# Patient Record
Sex: Female | Born: 1955 | Race: Black or African American | Hispanic: No | Marital: Single | Smoking: Current every day smoker
Health system: Southern US, Community
[De-identification: ages and names within clinical notes are randomized; demographics above are authoritative.]

## PROBLEM LIST (undated history)

## (undated) DIAGNOSIS — J40 Bronchitis, not specified as acute or chronic: Secondary | ICD-10-CM

## (undated) DIAGNOSIS — F1010 Alcohol abuse, uncomplicated: Secondary | ICD-10-CM

## (undated) DIAGNOSIS — J9610 Chronic respiratory failure, unspecified whether with hypoxia or hypercapnia: Secondary | ICD-10-CM

## (undated) DIAGNOSIS — Z720 Tobacco use: Secondary | ICD-10-CM

## (undated) DIAGNOSIS — M069 Rheumatoid arthritis, unspecified: Secondary | ICD-10-CM

## (undated) DIAGNOSIS — J189 Pneumonia, unspecified organism: Secondary | ICD-10-CM

## (undated) DIAGNOSIS — J449 Chronic obstructive pulmonary disease, unspecified: Secondary | ICD-10-CM

## (undated) DIAGNOSIS — E871 Hypo-osmolality and hyponatremia: Secondary | ICD-10-CM

## (undated) DIAGNOSIS — F1410 Cocaine abuse, uncomplicated: Secondary | ICD-10-CM

## (undated) DIAGNOSIS — I219 Acute myocardial infarction, unspecified: Secondary | ICD-10-CM

## (undated) HISTORY — PX: TUBAL LIGATION: SHX77

## (undated) HISTORY — PX: TONSILLECTOMY: SUR1361

---

## 1997-05-25 ENCOUNTER — Emergency Department (HOSPITAL_COMMUNITY): Admission: EM | Admit: 1997-05-25 | Discharge: 1997-05-25 | Payer: Self-pay | Admitting: Emergency Medicine

## 1997-11-01 ENCOUNTER — Emergency Department (HOSPITAL_COMMUNITY): Admission: EM | Admit: 1997-11-01 | Discharge: 1997-11-01 | Payer: Self-pay | Admitting: Emergency Medicine

## 1997-11-01 ENCOUNTER — Encounter: Payer: Self-pay | Admitting: Emergency Medicine

## 1998-08-27 ENCOUNTER — Encounter: Payer: Self-pay | Admitting: Emergency Medicine

## 1998-08-27 ENCOUNTER — Emergency Department (HOSPITAL_COMMUNITY): Admission: EM | Admit: 1998-08-27 | Discharge: 1998-08-27 | Payer: Self-pay | Admitting: Emergency Medicine

## 2000-08-27 ENCOUNTER — Encounter: Payer: Self-pay | Admitting: Family Medicine

## 2000-08-27 ENCOUNTER — Ambulatory Visit (HOSPITAL_COMMUNITY): Admission: RE | Admit: 2000-08-27 | Discharge: 2000-08-27 | Payer: Self-pay | Admitting: Family Medicine

## 2001-07-10 ENCOUNTER — Encounter: Payer: Self-pay | Admitting: Emergency Medicine

## 2001-07-10 ENCOUNTER — Emergency Department (HOSPITAL_COMMUNITY): Admission: EM | Admit: 2001-07-10 | Discharge: 2001-07-10 | Payer: Self-pay | Admitting: Emergency Medicine

## 2001-12-22 ENCOUNTER — Emergency Department (HOSPITAL_COMMUNITY): Admission: EM | Admit: 2001-12-22 | Discharge: 2001-12-22 | Payer: Self-pay | Admitting: *Deleted

## 2002-03-06 ENCOUNTER — Emergency Department (HOSPITAL_COMMUNITY): Admission: EM | Admit: 2002-03-06 | Discharge: 2002-03-06 | Payer: Self-pay | Admitting: Emergency Medicine

## 2002-07-16 ENCOUNTER — Emergency Department (HOSPITAL_COMMUNITY): Admission: EM | Admit: 2002-07-16 | Discharge: 2002-07-16 | Payer: Self-pay | Admitting: Emergency Medicine

## 2002-07-23 ENCOUNTER — Emergency Department (HOSPITAL_COMMUNITY): Admission: AD | Admit: 2002-07-23 | Discharge: 2002-07-23 | Payer: Self-pay

## 2002-11-19 ENCOUNTER — Emergency Department (HOSPITAL_COMMUNITY): Admission: EM | Admit: 2002-11-19 | Discharge: 2002-11-19 | Payer: Self-pay | Admitting: Emergency Medicine

## 2003-05-20 ENCOUNTER — Emergency Department (HOSPITAL_COMMUNITY): Admission: EM | Admit: 2003-05-20 | Discharge: 2003-05-20 | Payer: Self-pay | Admitting: Emergency Medicine

## 2003-06-11 ENCOUNTER — Emergency Department (HOSPITAL_COMMUNITY): Admission: EM | Admit: 2003-06-11 | Discharge: 2003-06-11 | Payer: Self-pay | Admitting: Emergency Medicine

## 2003-07-15 ENCOUNTER — Emergency Department (HOSPITAL_COMMUNITY): Admission: EM | Admit: 2003-07-15 | Discharge: 2003-07-15 | Payer: Self-pay | Admitting: Emergency Medicine

## 2004-03-11 ENCOUNTER — Emergency Department (HOSPITAL_COMMUNITY): Admission: EM | Admit: 2004-03-11 | Discharge: 2004-03-11 | Payer: Self-pay | Admitting: Emergency Medicine

## 2004-03-29 ENCOUNTER — Emergency Department (HOSPITAL_COMMUNITY): Admission: EM | Admit: 2004-03-29 | Discharge: 2004-03-29 | Payer: Self-pay | Admitting: Emergency Medicine

## 2004-03-31 ENCOUNTER — Ambulatory Visit: Payer: Self-pay | Admitting: *Deleted

## 2004-03-31 ENCOUNTER — Ambulatory Visit: Payer: Self-pay | Admitting: Internal Medicine

## 2004-04-07 ENCOUNTER — Ambulatory Visit: Payer: Self-pay | Admitting: Internal Medicine

## 2004-05-27 ENCOUNTER — Ambulatory Visit: Payer: Self-pay | Admitting: Internal Medicine

## 2004-06-03 ENCOUNTER — Ambulatory Visit: Payer: Self-pay | Admitting: Internal Medicine

## 2004-07-26 ENCOUNTER — Emergency Department (HOSPITAL_COMMUNITY): Admission: EM | Admit: 2004-07-26 | Discharge: 2004-07-26 | Payer: Self-pay | Admitting: Emergency Medicine

## 2004-07-31 ENCOUNTER — Ambulatory Visit: Payer: Self-pay | Admitting: Internal Medicine

## 2004-12-03 ENCOUNTER — Ambulatory Visit: Payer: Self-pay | Admitting: Internal Medicine

## 2004-12-28 ENCOUNTER — Emergency Department (HOSPITAL_COMMUNITY): Admission: EM | Admit: 2004-12-28 | Discharge: 2004-12-28 | Payer: Self-pay | Admitting: Emergency Medicine

## 2005-01-30 ENCOUNTER — Emergency Department (HOSPITAL_COMMUNITY): Admission: EM | Admit: 2005-01-30 | Discharge: 2005-01-30 | Payer: Self-pay | Admitting: Emergency Medicine

## 2005-03-08 ENCOUNTER — Emergency Department (HOSPITAL_COMMUNITY): Admission: EM | Admit: 2005-03-08 | Discharge: 2005-03-08 | Payer: Self-pay | Admitting: *Deleted

## 2005-04-11 ENCOUNTER — Emergency Department (HOSPITAL_COMMUNITY): Admission: EM | Admit: 2005-04-11 | Discharge: 2005-04-11 | Payer: Self-pay | Admitting: Emergency Medicine

## 2005-05-04 ENCOUNTER — Ambulatory Visit: Payer: Self-pay | Admitting: Internal Medicine

## 2005-05-07 ENCOUNTER — Ambulatory Visit: Payer: Self-pay | Admitting: Internal Medicine

## 2005-05-10 ENCOUNTER — Emergency Department (HOSPITAL_COMMUNITY): Admission: EM | Admit: 2005-05-10 | Discharge: 2005-05-10 | Payer: Self-pay | Admitting: *Deleted

## 2005-05-22 ENCOUNTER — Ambulatory Visit: Payer: Self-pay | Admitting: Internal Medicine

## 2005-06-12 ENCOUNTER — Emergency Department (HOSPITAL_COMMUNITY): Admission: EM | Admit: 2005-06-12 | Discharge: 2005-06-12 | Payer: Self-pay | Admitting: Emergency Medicine

## 2005-08-12 ENCOUNTER — Emergency Department (HOSPITAL_COMMUNITY): Admission: EM | Admit: 2005-08-12 | Discharge: 2005-08-13 | Payer: Self-pay | Admitting: Emergency Medicine

## 2005-10-14 ENCOUNTER — Emergency Department (HOSPITAL_COMMUNITY): Admission: EM | Admit: 2005-10-14 | Discharge: 2005-10-15 | Payer: Self-pay | Admitting: Emergency Medicine

## 2006-02-10 ENCOUNTER — Ambulatory Visit: Payer: Self-pay | Admitting: Family Medicine

## 2006-06-07 ENCOUNTER — Ambulatory Visit: Payer: Self-pay | Admitting: Internal Medicine

## 2006-06-08 ENCOUNTER — Emergency Department (HOSPITAL_COMMUNITY): Admission: EM | Admit: 2006-06-08 | Discharge: 2006-06-08 | Payer: Self-pay | Admitting: Emergency Medicine

## 2006-06-12 ENCOUNTER — Emergency Department (HOSPITAL_COMMUNITY): Admission: EM | Admit: 2006-06-12 | Discharge: 2006-06-13 | Payer: Self-pay | Admitting: *Deleted

## 2006-07-06 ENCOUNTER — Emergency Department (HOSPITAL_COMMUNITY): Admission: EM | Admit: 2006-07-06 | Discharge: 2006-07-06 | Payer: Self-pay | Admitting: Emergency Medicine

## 2006-07-26 DIAGNOSIS — J439 Emphysema, unspecified: Secondary | ICD-10-CM

## 2006-07-26 DIAGNOSIS — B370 Candidal stomatitis: Secondary | ICD-10-CM

## 2006-09-15 ENCOUNTER — Ambulatory Visit: Payer: Self-pay | Admitting: Family Medicine

## 2006-10-27 ENCOUNTER — Encounter (INDEPENDENT_AMBULATORY_CARE_PROVIDER_SITE_OTHER): Payer: Self-pay | Admitting: *Deleted

## 2006-11-25 ENCOUNTER — Ambulatory Visit: Payer: Self-pay | Admitting: Internal Medicine

## 2006-12-13 ENCOUNTER — Ambulatory Visit: Payer: Self-pay | Admitting: Internal Medicine

## 2006-12-20 ENCOUNTER — Ambulatory Visit: Payer: Self-pay | Admitting: Internal Medicine

## 2007-01-14 ENCOUNTER — Emergency Department (HOSPITAL_COMMUNITY): Admission: EM | Admit: 2007-01-14 | Discharge: 2007-01-14 | Payer: Self-pay | Admitting: Emergency Medicine

## 2007-05-02 ENCOUNTER — Ambulatory Visit: Payer: Self-pay | Admitting: Internal Medicine

## 2007-05-17 ENCOUNTER — Emergency Department (HOSPITAL_COMMUNITY): Admission: EM | Admit: 2007-05-17 | Discharge: 2007-05-17 | Payer: Self-pay | Admitting: Emergency Medicine

## 2007-05-25 ENCOUNTER — Inpatient Hospital Stay (HOSPITAL_COMMUNITY): Admission: EM | Admit: 2007-05-25 | Discharge: 2007-06-09 | Payer: Self-pay | Admitting: Emergency Medicine

## 2007-05-25 ENCOUNTER — Ambulatory Visit: Payer: Self-pay | Admitting: Internal Medicine

## 2007-05-27 ENCOUNTER — Encounter: Payer: Self-pay | Admitting: Internal Medicine

## 2007-06-08 ENCOUNTER — Encounter: Payer: Self-pay | Admitting: Cardiology

## 2007-06-11 ENCOUNTER — Emergency Department (HOSPITAL_COMMUNITY): Admission: EM | Admit: 2007-06-11 | Discharge: 2007-06-12 | Payer: Self-pay | Admitting: Emergency Medicine

## 2007-06-29 DIAGNOSIS — I509 Heart failure, unspecified: Secondary | ICD-10-CM | POA: Insufficient documentation

## 2007-06-29 DIAGNOSIS — I10 Essential (primary) hypertension: Secondary | ICD-10-CM

## 2007-06-29 DIAGNOSIS — J984 Other disorders of lung: Secondary | ICD-10-CM

## 2007-07-18 ENCOUNTER — Ambulatory Visit: Payer: Self-pay | Admitting: Internal Medicine

## 2007-08-07 ENCOUNTER — Emergency Department (HOSPITAL_COMMUNITY): Admission: EM | Admit: 2007-08-07 | Discharge: 2007-08-07 | Payer: Self-pay | Admitting: Emergency Medicine

## 2007-08-08 ENCOUNTER — Ambulatory Visit: Payer: Self-pay | Admitting: Internal Medicine

## 2007-08-09 ENCOUNTER — Emergency Department (HOSPITAL_COMMUNITY): Admission: EM | Admit: 2007-08-09 | Discharge: 2007-08-09 | Payer: Self-pay | Admitting: Emergency Medicine

## 2007-08-15 ENCOUNTER — Ambulatory Visit: Payer: Self-pay | Admitting: Internal Medicine

## 2007-09-07 ENCOUNTER — Ambulatory Visit: Payer: Self-pay | Admitting: Internal Medicine

## 2007-09-15 ENCOUNTER — Ambulatory Visit: Payer: Self-pay | Admitting: Internal Medicine

## 2007-09-23 ENCOUNTER — Emergency Department (HOSPITAL_COMMUNITY): Admission: EM | Admit: 2007-09-23 | Discharge: 2007-09-23 | Payer: Self-pay | Admitting: Emergency Medicine

## 2007-10-11 ENCOUNTER — Ambulatory Visit: Payer: Self-pay | Admitting: Internal Medicine

## 2007-10-18 ENCOUNTER — Encounter (INDEPENDENT_AMBULATORY_CARE_PROVIDER_SITE_OTHER): Payer: Self-pay | Admitting: Family Medicine

## 2007-10-18 ENCOUNTER — Ambulatory Visit: Payer: Self-pay | Admitting: Internal Medicine

## 2007-10-18 LAB — CONVERTED CEMR LAB
ALT: 19 units/L (ref 0–35)
AST: 42 units/L — ABNORMAL HIGH (ref 0–37)
Albumin: 5.4 g/dL — ABNORMAL HIGH (ref 3.5–5.2)
Alkaline Phosphatase: 105 units/L (ref 39–117)
BUN: 4 mg/dL — ABNORMAL LOW (ref 6–23)
Basophils Absolute: 0 10*3/uL (ref 0.0–0.1)
Basophils Relative: 1 % (ref 0–1)
Calcium: 9.8 mg/dL (ref 8.4–10.5)
Chloride: 94 meq/L — ABNORMAL LOW (ref 96–112)
Digitoxin Lvl: <0.1 ng/mL — ABNORMAL LOW (ref 0.8–2.0)
Eosinophils Absolute: 0.3 10*3/uL (ref 0.0–0.7)
HDL: 98 mg/dL (ref >39)
LDL Cholesterol: 34 mg/dL (ref 0–99)
MCHC: 35.2 g/dL (ref 30.0–36.0)
MCV: 81.1 fL (ref 78.0–100.0)
Neutro Abs: 2.3 10*3/uL (ref 1.7–7.7)
Neutrophils Relative %: 37 % — ABNORMAL LOW (ref 43–77)
Platelets: 240 10*3/uL (ref 150–400)
Potassium: 4 meq/L (ref 3.5–5.3)
RBC: 5.12 M/uL — ABNORMAL HIGH (ref 3.87–5.11)
RDW: 13.8 % (ref 11.5–15.5)
Sodium: 131 meq/L — ABNORMAL LOW (ref 135–145)

## 2007-11-03 ENCOUNTER — Ambulatory Visit: Payer: Self-pay | Admitting: Internal Medicine

## 2007-11-03 DIAGNOSIS — R079 Chest pain, unspecified: Secondary | ICD-10-CM

## 2007-11-15 ENCOUNTER — Ambulatory Visit: Payer: Self-pay | Admitting: Internal Medicine

## 2007-11-17 ENCOUNTER — Ambulatory Visit: Payer: Self-pay | Admitting: Internal Medicine

## 2007-11-17 ENCOUNTER — Encounter (INDEPENDENT_AMBULATORY_CARE_PROVIDER_SITE_OTHER): Payer: Self-pay | Admitting: Family Medicine

## 2007-11-17 LAB — CONVERTED CEMR LAB
ALT: 15 units/L (ref 0–35)
AST: 33 units/L (ref 0–37)
CO2: 22 meq/L (ref 19–32)
Chloride: 96 meq/L (ref 96–112)
Creatinine, Ser: 0.68 mg/dL (ref 0.40–1.20)
Sodium: 132 meq/L — ABNORMAL LOW (ref 135–145)
Total Bilirubin: 0.5 mg/dL (ref 0.3–1.2)
Total Protein: 8 g/dL (ref 6.0–8.3)

## 2007-12-06 ENCOUNTER — Ambulatory Visit: Payer: Self-pay | Admitting: Cardiology

## 2007-12-13 ENCOUNTER — Encounter: Payer: Self-pay | Admitting: Internal Medicine

## 2007-12-13 ENCOUNTER — Telehealth (INDEPENDENT_AMBULATORY_CARE_PROVIDER_SITE_OTHER): Payer: Self-pay | Admitting: *Deleted

## 2008-01-19 ENCOUNTER — Ambulatory Visit: Payer: Self-pay | Admitting: Internal Medicine

## 2008-01-19 DIAGNOSIS — R64 Cachexia: Secondary | ICD-10-CM

## 2008-01-19 DIAGNOSIS — F1910 Other psychoactive substance abuse, uncomplicated: Secondary | ICD-10-CM

## 2008-02-17 ENCOUNTER — Ambulatory Visit: Payer: Self-pay | Admitting: Cardiology

## 2008-02-22 ENCOUNTER — Ambulatory Visit: Payer: Self-pay

## 2008-02-27 ENCOUNTER — Ambulatory Visit: Payer: Self-pay | Admitting: Internal Medicine

## 2008-03-12 ENCOUNTER — Telehealth (INDEPENDENT_AMBULATORY_CARE_PROVIDER_SITE_OTHER): Payer: Self-pay | Admitting: *Deleted

## 2008-04-11 ENCOUNTER — Encounter: Payer: Self-pay | Admitting: Family Medicine

## 2008-04-11 ENCOUNTER — Ambulatory Visit: Payer: Self-pay | Admitting: Internal Medicine

## 2008-04-11 LAB — CONVERTED CEMR LAB
BUN: 7 mg/dL (ref 6–23)
Basophils Absolute: 0 10*3/uL (ref 0.0–0.1)
Basophils Relative: 0 % (ref 0–1)
CO2: 21 meq/L (ref 19–32)
Creatinine, Ser: 0.69 mg/dL (ref 0.40–1.20)
Eosinophils Absolute: 0.1 10*3/uL (ref 0.0–0.7)
Eosinophils Relative: 1 % (ref 0–5)
Glucose, Bld: 59 mg/dL — ABNORMAL LOW (ref 70–99)
HCT: 37.7 % (ref 36.0–46.0)
Hemoglobin: 12.7 g/dL (ref 12.0–15.0)
MCHC: 33.7 g/dL (ref 30.0–36.0)
Monocytes Absolute: 0.6 10*3/uL (ref 0.1–1.0)
Monocytes Relative: 7 % (ref 3–12)
Platelets: 236 10*3/uL (ref 150–400)
RDW: 14.6 % (ref 11.5–15.5)
TSH: 3.011 microintl units/mL (ref 0.350–4.50)
Total Bilirubin: 0.7 mg/dL (ref 0.3–1.2)
Total Protein: 8.2 g/dL (ref 6.0–8.3)

## 2008-05-14 ENCOUNTER — Encounter: Payer: Self-pay | Admitting: Internal Medicine

## 2008-05-21 ENCOUNTER — Ambulatory Visit: Payer: Self-pay | Admitting: Internal Medicine

## 2008-05-23 ENCOUNTER — Ambulatory Visit (HOSPITAL_COMMUNITY): Admission: RE | Admit: 2008-05-23 | Discharge: 2008-05-23 | Payer: Self-pay | Admitting: Family Medicine

## 2008-05-25 ENCOUNTER — Telehealth (INDEPENDENT_AMBULATORY_CARE_PROVIDER_SITE_OTHER): Payer: Self-pay | Admitting: *Deleted

## 2008-06-19 ENCOUNTER — Ambulatory Visit: Payer: Self-pay | Admitting: Family Medicine

## 2008-06-19 LAB — CONVERTED CEMR LAB
ALT: 21 units/L (ref 0–35)
AST: 43 units/L — ABNORMAL HIGH (ref 0–37)
Calcium: 9.7 mg/dL (ref 8.4–10.5)
Chloride: 98 meq/L (ref 96–112)
Creatinine, Ser: 0.77 mg/dL (ref 0.40–1.20)
Potassium: 4.2 meq/L (ref 3.5–5.3)
Sodium: 135 meq/L (ref 135–145)
Total CHOL/HDL Ratio: 2
Total Protein: 8.3 g/dL (ref 6.0–8.3)

## 2008-06-25 ENCOUNTER — Ambulatory Visit: Payer: Self-pay | Admitting: Family Medicine

## 2008-08-09 ENCOUNTER — Ambulatory Visit: Payer: Self-pay | Admitting: Internal Medicine

## 2008-11-12 ENCOUNTER — Ambulatory Visit: Payer: Self-pay | Admitting: Family Medicine

## 2009-02-14 ENCOUNTER — Telehealth (INDEPENDENT_AMBULATORY_CARE_PROVIDER_SITE_OTHER): Payer: Self-pay | Admitting: *Deleted

## 2009-07-30 ENCOUNTER — Ambulatory Visit: Payer: Self-pay | Admitting: Family Medicine

## 2009-07-30 ENCOUNTER — Encounter (INDEPENDENT_AMBULATORY_CARE_PROVIDER_SITE_OTHER): Payer: Self-pay | Admitting: Adult Health

## 2009-07-30 LAB — CONVERTED CEMR LAB
ALT: 14 units/L (ref 0–35)
AST: 43 units/L — ABNORMAL HIGH (ref 0–37)
Albumin: 4.2 g/dL (ref 3.5–5.2)
Calcium: 9.8 mg/dL (ref 8.4–10.5)
Chloride: 88 meq/L — ABNORMAL LOW (ref 96–112)
Eosinophils Relative: 0 % (ref 0–5)
HCT: 43.1 % (ref 36.0–46.0)
Lymphocytes Relative: 36 % (ref 12–46)
Lymphs Abs: 2.6 10*3/uL (ref 0.7–4.0)
Magnesium: 1.8 mg/dL (ref 1.5–2.5)
Neutrophils Relative %: 58 % (ref 43–77)
Platelets: 238 10*3/uL (ref 150–400)
Potassium: 4.3 meq/L (ref 3.5–5.3)
Pro B Natriuretic peptide (BNP): 9 pg/mL (ref 0.0–100.0)
TSH: 0.983 microintl units/mL (ref 0.350–4.500)
Total Protein: 8.6 g/dL — ABNORMAL HIGH (ref 6.0–8.3)
Vit D, 25-Hydroxy: 39 ng/mL (ref 30–89)
WBC: 7.3 10*3/uL (ref 4.0–10.5)

## 2009-08-02 ENCOUNTER — Encounter (INDEPENDENT_AMBULATORY_CARE_PROVIDER_SITE_OTHER): Payer: Self-pay | Admitting: Adult Health

## 2009-08-02 LAB — CONVERTED CEMR LAB: Uric Acid, Serum: 6.7 mg/dL (ref 2.4–7.0)

## 2009-08-21 ENCOUNTER — Ambulatory Visit: Payer: Self-pay | Admitting: Internal Medicine

## 2009-08-21 ENCOUNTER — Encounter (INDEPENDENT_AMBULATORY_CARE_PROVIDER_SITE_OTHER): Payer: Self-pay | Admitting: Adult Health

## 2009-08-21 LAB — CONVERTED CEMR LAB
CO2: 18 meq/L — ABNORMAL LOW (ref 19–32)
Calcium: 9.1 mg/dL (ref 8.4–10.5)
Creatinine, Ser: 0.53 mg/dL (ref 0.40–1.20)

## 2009-08-23 ENCOUNTER — Ambulatory Visit: Payer: Self-pay | Admitting: Internal Medicine

## 2010-03-02 ENCOUNTER — Encounter: Payer: Self-pay | Admitting: Internal Medicine

## 2010-03-03 ENCOUNTER — Encounter: Payer: Self-pay | Admitting: Internal Medicine

## 2010-03-11 NOTE — Assessment & Plan Note (Signed)
Summary: per pt call/prior hfu/no showed in may/mhh   Visit Type:  Hospital Follow-up  Chief Complaint:  hsp fu visit...Marland KitchenMarland KitchenMarland Kitchenhas lung disease but not sure what kind........Marland Kitchenreviewed meds.  History of Present Illness: OV 9/03/16/2007: 55 year old female with severe copd, polysubstance abuse was admitted 05/25/2007 to 06/09/2007 with AE-COPD/VDRF and also found to be in CHF wiht subsequwnt improvement of ef to 55% (? Takosubo). Subsequently lost to folllowup. But, returns now. OVerall feels well. Still has occassional left supra-mammary pain. Occurs 3 times/week and occurs only on walking uphill but not always. Relieved by rest. Pain associated with dyspnea but can get dyspneic without pain. Denies edema, hemoptysis, worsening orthopnea (uses 3 pillows), paroxysmal nocturnal dyspnea. Still smoking 1-2 cig/day and  1 beer per day but has quit crack cocaine (2 years ago). Ran out of spiriva after discharge but was using advair 2puff bid and albuterol prn. Now, past 3 weeks has gotten [facility] from [facility] but using it prn. Still uninsured but waiting for medicaid. Got flu shot. Exam shows thrush (declined hiv). Meds changed to advair hfa, spriiva and abluterol as needed hfa. O2 stareted (desaturated with exertion)       Prior Medication List:  ADVAIR DISKUS 500-50 MCG/DOSE  MISC (FLUTICASONE-SALMETEROL) One puff two times a day ALBUTEROL 90 MCG/ACT  AERS (ALBUTEROL) 1 to 2 puffs every 12 hours as needed SPIRIVA HANDIHALER 18 MCG  CAPS (TIOTROPIUM BROMIDE MONOHYDRATE) Inhale contents of 1 capsule once a day BAYER ASPIRIN 325 MG  TABS (ASPIRIN) Take 1 tablet by mouth once a day LISINOPRIL-HYDROCHLOROTHIAZIDE 10-12.5 MG  TABS (LISINOPRIL-HYDROCHLOROTHIAZIDE) Take 1 tablet by mouth once a day ZOCOR 40 MG  TABS (SIMVASTATIN) Take 1 tablet by mouth once a day * PREDNISONE TAPER    Updated Prior Medication List: ADVAIR DISKUS 500-50 MCG/DOSE  MISC (FLUTICASONE-SALMETEROL) One puff two times a  day ALBUTEROL 90 MCG/ACT  AERS (ALBUTEROL) 1 to 2 puffs every 12 hours as needed SPIRIVA HANDIHALER 18 MCG  CAPS (TIOTROPIUM BROMIDE MONOHYDRATE) Inhale contents of 1 capsule once a day BAYER ASPIRIN 325 MG  TABS (ASPIRIN) Take 1 tablet by mouth once a day LISINOPRIL-HYDROCHLOROTHIAZIDE 10-12.5 MG  TABS (LISINOPRIL-HYDROCHLOROTHIAZIDE) Take 1 tablet by mouth once a day ZOCOR 40 MG  TABS (SIMVASTATIN) Take 1 tablet by mouth once a day * PREDNISONE TAPER   Current Allergies (reviewed today): No known allergies   Past Medical History:    Reviewed history from 06/29/2007 and no changes required:       1. Acute exacerbation of chronic obstructive pulmonary disease and             ventilator dependent respiratory failure. - 05/25/2007 to 06/09/2007. Equivocal eval for VCD        2. Hyponatremia. - april 2009        3. Hyperglycemia. - april 2009        4. Altered mental status.         5. Elevated cardiac enzymes.         6. Hypertension.         7. Congestive heart failure. - 2D echo on May 27, 2007, which showed severe             hypokinesis with a ventricular ejection rate of 30%.  A repeat 2-D             echo on June 08, 2007, showed ejection fraction of 55% with a mild  AVR and with IVC being flat. Suspected TAKA-SUBO by Dr. Teressa Lower       8. Pulmonary nodules bilateral upper lobes - 6-50mm April 2009, on serial CT scanfollowup         10.COPD with discharge Fev1 - 30% in April 2009 predicted but did not desaturate on   exertion.        11. HIV negative per history in June 2009; reportedly checked at [facility]   Family History:    Reviewed history and no changes required:       emphysema-father       rheumatism- mother       mother had had TB as a child  Social History:    Reviewed history and no changes required:       single       3 children       occupation----dry cleaners       life-long smoker, former heavy EtOH use now down to one         beer daily,         The patient is single.  [place]  She has three children.  She is unemployed.  She admits         previously to smoking one pack of cigarettes a week.  She drinks one         beer per day.  She initially denied illicit drug use but her family says         she uses crack cocaine on a regular basis.    Risk Factors:  Tobacco use:  current    Year started:  1979    Cigarettes:  Yes -- 3 cigs pack(s) per day Alcohol use:  yes    Type:  beer    Drinks per day:  1   Review of Systems      See HPI   Vital Signs:  Patient Profile:   55 Years Old Female Height:     62 inches Weight:      88 pounds BMI:     16.15 O2 Sat:      98 % O2 treatment:    Room Air Temp:     98.2 degrees F oral Pulse rate:   74 / minute BP sitting:   120 / 60  (left arm) Cuff size:   regular  Pt. in pain?   no  Vitals Entered By: Clarise Cruz [facility]) (November 03, 2007 3:50 PM)                  Physical Exam  General:     bmi 16.5cachectic.   Head:     normocephalic and atraumatic Eyes:     PERRLA/EOM intact; conjunctiva and sclera clear Ears:     TMs intact and clear with normal canals Nose:     no deformity, discharge, inflammation, or lesions Mouth:     no deformity or lesions. Dentures +. Significant thrush + Neck:     no masses, thyromegaly, or abnormal cervical nodes Chest Wall:     no deformities noted Lungs:     clear bilaterally to auscultation and percussionprolonged exhilation.   Heart:     regular rate and rhythm, S1, S2 without murmurs, rubs, gallops, or clicks Abdomen:     scaphoid, no mass Msk:     no deformity or scoliosis noted with normal posture Pulses:     pulses normal  Extremities:     no clubbing, cyanosis, edema, or deformity noted Neurologic:     CN II-XII grossly intact with normal reflexes, coordination, muscle strength and tone Skin:     intact without lesions or rashes Cervical Nodes:     no  significant adenopathy Axillary Nodes:     no significant adenopathy Psych:     alert and cooperative; normal mood and affect; normal attention span and concentration    Flu Vaccine Consent Questions     Do you have a history of severe allergic reactions to this vaccine? no    Any prior history of allergic reactions to egg and/or gelatin? no    Do you have a sensitivity to the preservative Thimersol? no    Do you have a past history of Guillan-Barre Syndrome? no    Do you currently have an acute febrile illness? no    Have you ever had a severe reaction to latex? no    Vaccine information given and explained to patient? yes    Are you currently pregnant? no    Lot Number:[id number]   Site Given  Left Deltoid IM Clarise Cruz [facility])  November 03, 2007 4:04 PM    Pulmonary Function Test Height (in.): 20 Gender: Female    Problem # 1:  COPD (ICD-496) Assessment: Unchanged Severe COPD. Clinically stable but desaturated on exertion. Has thrush on advair disk  plan start home o2 continue spiriva walking desat test stop advair disk and change to hfa lowest dose Her updated medication list for this problem includes:    Advair Hfa 45-21 Mcg/act Aero (Fluticasone-salmeterol) .Marland Kitchen..Marland Kitchen Two puffs twice daily    Albuterol 90 Mcg/act Aers (Albuterol) .Marland Kitchen... 1 to 2 puffs every 12 hours as needed    Spiriva Handihaler 18 Mcg Caps (Tiotropium bromide monohydrate) ..... Inhale contents of 1 capsule once a day  Orders: Radiology Referral (Radiology) Radiology Referral (Radiology) Est. Patient Level IV (78295)   Problem # 2:  CANDIDIASIS, MOUTH (THRUSH) (ICD-112.0) Assessment: Deteriorated ? due to advair disk v underlying hiv.   plan nystatis swish and swallow hiv testing ([facility] her report) Orders: Est. Patient Level IV (62130)   Problem # 3:  CHEST PAIN-UNSPECIFIED (ICD-786.50) Assessment: New has exertional chest pain.   plan refer  cards (she says she has appt with dr. Ardeth Sportsman pending but I see nothing like that so I will refer) Orders: Est. Patient Level IV ([id number])   Problem # 4:  PULMONARY NODULE (ICD-518.89) Assessment: Unchanged 6-9 mm nodules in both upper lobes on april 2009  plan repeat ct chest now;  30month scan Orders: Radiology Referral (Radiology) Radiology Referral (Radiology) Est. Patient Level IV (46962)   Medications Added to Medication List This Visit: 1)  Advair Hfa 45-21 Mcg/act Aero (Fluticasone-salmeterol) .... Two puffs twice daily 2)  Nystatin 100000 Unit/ml Susp (Nystatin) .... Take one teaspoonful by mouth swish & swallow four times a day x 5 days   Patient Instructions: 1)  Start home oxygen 2)  Ttake spiriva 1 puff daily 3)  Stop advair disk 4)  Take advair hfa  2 puff two times a day 5)  Take nystatin swish and swallow for oral fungus for 5 days as instructed 6)  Please visit with cardiologist asap for chest pain 7)  HAve ct scan of chest sometime now for nodules that could be cancer 8)  Return in 1 month to see if your symptoms are better 9)  Take flu shot  10)  Get inhaler samples   Prescriptions: NYSTATIN 100000 UNIT/ML  SUSP (NYSTATIN) Take one teaspoonful by mouth swish & swallow four times a day x 5 days  #148ml x 1   Entered and Authorized by:   Kalman Shan MD   Signed by:   Kalman Shan MD on 11/03/2007   Method used:   Print then Give to Patient   RxID:   [id number] SPIRIVA HANDIHALER 18 MCG  CAPS (TIOTROPIUM BROMIDE MONOHYDRATE) Inhale contents of 1 capsule once a day  #1 x 6   Entered and Authorized by:   Kalman Shan MD   Signed by:   Kalman Shan MD on 11/03/2007   Method used:   Print then Give to Patient   RxID:   [id number] ALBUTEROL 90 MCG/ACT  AERS (ALBUTEROL) 1 to 2 puffs every 12 hours as needed  #1 x 6   Entered and Authorized by:   Kalman Shan MD   Signed by:   Kalman Shan MD on 11/03/2007   Method used:    Print then Give to Patient   RxID:   [id number] ADVAIR HFA 45-21 MCG/ACT  AERO (FLUTICASONE-SALMETEROL) Two puffs twice daily  #1 x 6   Entered and Authorized by:   Kalman Shan MD   Signed by:   Kalman Shan MD on 11/03/2007   Method used:   Print then Give to Patient   RxID:   [email or web address]  ]  Appended Document: per pt call/prior hfu/no showed in may/mhh      Serial Vital Signs/Assessments:  Comments: Ambulatory Pulse Oximetry Resting; HR_89____    02 Sat_99____  Lap1 (185 feet)   HR_101____   02 Sat_84____ Lap2 (185 feet)   HR_124 _____02 Sat_74____    Lap3 (185 feet)   HR_____   02 Sat_____  ___Test Completed without Difficulty _x__Test Stopped due to: due to O2 saturation Clarise Cruz [facility])  November 04, 2007 9:19 AM   By: Clarise Cruz [facility])      Current Allergies: No known allergies           ]

## 2010-03-11 NOTE — Progress Notes (Signed)
Summary: triage/dry cough/stress incontinence/urine  Phone Note Call from Patient   Caller: Patient Reason for Call: Talk to Nurse Summary of Call: Patient states she has been sick for one week with a dry cough..and stress incontience of urine.Marland KitchenMarland KitchenShe is diabetic and states her CBG's have been 140 or lower..She is eating well and denies fever, saying " I just feel weak".  Appointment offered to patient.Marland KitchenMarland KitchenShe states she can not come in because she has to watch her grand daughter at 9:45 until this evening...there is no one else to do this.Marland KitchenMarland KitchenAdvised patient she can get OTC Robutussin and I can give her an early morning appointment in AM or she can go to [facility] tonight...she states she will call me back once she knows her daughters schedule tomorrow. Initial call taken by: Conchita Paris,  February 14, 2009 12:22 PM     Appended Document: triage/dry cough/stress incontinence/urine This information was put on wrong chart...disregard this phone note.Marland KitchenMarland KitchenConchita Paris RN

## 2010-03-11 NOTE — Progress Notes (Signed)
Summary: triage/cold cough  Phone Note Call from Patient   Caller: Patient Reason for Call: Talk to Nurse Summary of Call: Patient called stating she has had a cough and cold 2 weeks with headache.Marland KitchenMarland KitchenThe cough has been productive with green sputum..Patient staes she had a hx of asthma and is going to call her pharmacy for inhaler refill. She states she has been SOB when asked. She is speaking in complete sentences with no sounds of breathlessness noted.Marland KitchenMarland KitchenPatient offered acute appointment d/t her history and length of s/s.Marland KitchenMarland KitchenShe denied appointment saying she just wanted  RX  for  her headache.. Again appointment suggested.Marland KitchenMarland KitchenPatient refused will call back if needed. Initial call taken by: Conchita Paris,  February 14, 2009 12:06 PM

## 2010-05-15 ENCOUNTER — Telehealth: Payer: Self-pay | Admitting: *Deleted

## 2010-05-15 NOTE — Telephone Encounter (Signed)
Summary of Call: was supposed to see me may 2010 after ct chest. never showed up. pls arrange fu Initial call taken by: Kalman Shan MD,  April 29, 2010 1:14 PM  [email or web address].Carron Curie, CMA

## 2010-05-23 NOTE — Telephone Encounter (Signed)
[email or web address]. Jennifer Hurley, CMA  

## 2010-06-10 ENCOUNTER — Encounter: Payer: Self-pay | Admitting: *Deleted

## 2010-06-10 NOTE — Telephone Encounter (Signed)
LMTCBx3. I will mail letter per protocol for pt to call our office. Carron Curie, CMA

## 2010-06-24 NOTE — Discharge Summary (Signed)
NAMEELLANA, Jennifer Hurley               ACCOUNT NO.:  [id number]   MEDICAL RECORD NO.:  [id number]          PATIENT TYPE:  INP   LOCATION:  1411                         FACILITY:  [facility]   PHYSICIAN:  Kalman Shan, MD   DATE OF BIRTH:  01-22-1956   DATE OF ADMISSION:  05/25/2007  DATE OF DISCHARGE:  06/09/2007                               DISCHARGE SUMMARY   DISCHARGE DIAGNOSES:  1. Acute exacerbation of chronic obstructive pulmonary disease and      ventilator dependent respiratory failure.  2. Hyponatremia.  3. Hyperglycemia.  4. Altered mental status.  5. Elevated cardiac enzymes.  6. Hypertension.  7. Congestive heart failure.  8. Purulent bronchitis.  9. Pulmonary nodules bilateral upper lobes - 6-63mm, worrisome for      lung cancer, on serial CT scanfollowup  10.COPD with discharge Fev1 - 30% predicted but did not desaturate on      exertion.   HISTORY OF PRESENT ILLNESS:  Ms. Jennifer Hurley is a 55 year old African  American female, life-long smoker, former heavy EtOH use now down to one  beer daily, short of breath for 10 years but not O2 dependent.  She  presented initially with shortness of breath at rest and unable to walk.  She denied fever, chills, sweats, purulent sputum.  CT scan at that time  on May 17, 2007, from a previous admit showed bilateral upper lobe  spiculation.  Of note, her mother had had TB as a child.  She was  originally admitted by Dr. Ladell Pier, for evaluation of respiratory  distress.  Pulmonary critical care assumed her care when she became  ventilator dependent respiratory failure.   LABS AND TUBES:  She had orotracheal tube from May 27, 2007, to June 01, 2007.  She had a right PICC from May 28, 2007, until June 08, 2007.   ANTIBIOTICS:  Consisted of  1. Zosyn from May 26, 2007, to May 29, 2007.  2. Ceftriaxone from May 29, 2007, to June 06, 2007.   MICROBIOLOGY DATA:  She had multiple AFBs that were negative.  Urinary  culture negative for Legionella and Strep.  Blood culture showed no  growth.  Nasal swabs for flu A and B were both negative.   PROPHYLAXIS:  She had proton pump inhibitor and low molecular weight  heparin.   PROTOCOL:  She is on a sedation protocol.   TESTS:  1. She had a 2-D echo on May 27, 2007, which showed severe      hypokinesis with a ventricular ejection rate of 30%.  A repeat 2-D      echo on June 08, 2007, showed ejection fraction of 55% with a mild      AVR and with IVC being flat.  2. She had pulmonary function test performed.  She was unable to do      lung volumes or diffusion but her [facility] was noted to be 49% of      predicted at 1.22, FEV1 was 29% of predicted at 0.60, FEV1/FVC was      noted to be 49 and consistent with  severe obstructive lung disease.   LABORATORY DATA:  CT of the chest on May 17, 2007, prior to admission,  showed spiculated densities over both apices, left slightly greater than  right, although this may represent confluence occurring in fibrosis  related COPD neoplasm and neoplasm is impossible to exclude, therefore  PET scan was recommended.  She does have severe central lobar emphysema.  Chest x-ray on June 04, 2007, demonstrates improved aeration of left  lung base, persistent densities of left lung bases, left costophrenic  angle as described, emphysematous changes.  PICC line noted to be in  good position.   Hemoglobin 9.6, hematocrit 34.2, wbc 9.1, platelets 257.  Chemistries:  Sodium 126 (was 132 the prior day), potassium 3.9, chloride 94, CO2 25,  BUN 8, creatinine 0.47, glucose 83.  CK 283, CK-MB 6, troponin I 0.89.  Brain natriuretic peptide  was 292.  The troponin did reach a peak of  1.11.  Calcium 8.7.   HOSPITAL COURSE BY DISCHARGE DIAGNOSIS:  1. Ventilator dependent respiratory failure.  Patient was admitted to      [facility] initially by Ladell Pier, M.D.  She was in      acute respiratory distress, required  orotracheal intubation,      mechanical ventilatory support and treatment for acute exacerbation      of chronic obstructive pulmonary disease with IV steroids, IV      antibiotics as well as bronchodilators.  No organisms was specified      despite multiple culture data.  She completed antimicrobial      therapy.  She was successfully liberated from mechanical      ventilatory support on June 03, 2007.  Her pulmonary status is      marginal at best, although she does not require oxygen.  She was      ambulated greater than 100 meters and her O2 saturations remained      92%.  She does have severe central lobar emphysema by CT scan.  She      was placed on her home medications of Advair 250/50, albuterol      puffer with the addition of Spiriva for control of her chronic      obstructive pulmonary disease.  She is also asked to cease smoking.  2. Hyponatremia:  Hyponatremia was an ongoing problem, treated with      electrolytes and fluids.  3. Hyperglycemia:  She was hyperglycemic while on steroids, improved      with steroids being decreased.  4. Altered mental status:  We did use Risperdal and thiamine, Xanax      and Klonopin.  She did become sedated and eventually was taken off      all sedation.  5. Elevated cardiac enzymes with severe left ventricular dysfunction      by 2-D echo.  Cardiology consult was obtained and they will follow.      She was started on ACE inhibitors and her cholesterol lowering      medications.  She will follow up with Dr. Nicholes Mango at      specified time.  6. Congestive heart failure which is improved with diuresis.  She      remains on digoxin and ACE inhibitor.  7. Purulent bronchitis:  No no organism was specified, she is treated      with antimicrobial therapy.  8. Bilateral Upper Lobe Nodules - worrisome for lung cancer. Too small      to PET scan.  REcommended 3 month serial CT scan and followup with      Dr. Illene Silver at [facility].   DISCHARGE  MEDICATIONS:  1. Advair 250/50 one puff twice a day.  2. Spiriva 18 mcg one cap inhaled daily.  3. Aspirin 325 mg one daily.  4. Lisinopril 10 mg one daily.  5. Albuterol MDI 1-2 puffs q.12h. p.r.n.  6. Prednisone on taper 20 mg x3 days, 10 mg x3 days then stop.  7. Zocor 40 mg daily.   DIET:  Heart healthy diet.   FOLLOW UP:  1. She is to get a follow-up appointment with Dr. Marchelle Gearing on Jun 30, 2007, at 9:40 a.m.  2. Bevelyn Buckles. Bensimhon, MD, [facility], on Jun 22, 2007, at      12:15 p.m.   DISPOSITION/CONDITION ON DISCHARGE:  Her acute exacerbation of chronic  obstructive pulmonary disease has resolved.  She is returned to her  normal pulmonary baseline and shortness of breath with ambulation  greater than 100 feet.  She has been placed on medications and will  follow up with Dr. Marchelle Gearing.  She had PFTs performed in a walk test in  [place], which demonstrated O2 saturations greater than 92%, ambulating  greater than 1000 feet.  She has had difficulty with catheter placement  and Dr. Earlene Plater of urology was consulted and a catheter was placed.  The  gases demonstrated pH 7.50, pACO2 of 28, pO2 217 with a bicarb over 22.  This was on a pressure radiant with volume control with FIO2 of 50% with  rate of 8, tidal volume 605 of PEEP.   CONDITION ON DISCHARGE:  Improved.      Devra Dopp, MSN, ACNP      Kalman Shan, MD  Electronically Signed    SM/MEDQ  D:  06/09/2007  T:  06/09/2007  Job:  [id number]   cc:   Bevelyn Buckles. Bensimhon, MD  [address], [place] [address]   [facility]  Fax: [phone]   Pablo Lawrence. Philipp Deputy, M.D.  Fax: [phone]
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
## 2010-09-04 ENCOUNTER — Emergency Department (HOSPITAL_COMMUNITY): Payer: Self-pay

## 2010-09-04 ENCOUNTER — Emergency Department (HOSPITAL_COMMUNITY)
Admission: EM | Admit: 2010-09-04 | Discharge: 2010-09-04 | Disposition: A | Discharge status: Home / Self Care | Payer: Self-pay | Attending: Emergency Medicine | Admitting: Emergency Medicine

## 2010-09-04 DIAGNOSIS — I10 Essential (primary) hypertension: Secondary | ICD-10-CM | POA: Insufficient documentation

## 2010-09-04 DIAGNOSIS — R0609 Other forms of dyspnea: Secondary | ICD-10-CM | POA: Insufficient documentation

## 2010-09-04 DIAGNOSIS — J449 Chronic obstructive pulmonary disease, unspecified: Secondary | ICD-10-CM | POA: Insufficient documentation

## 2010-09-04 DIAGNOSIS — R0602 Shortness of breath: Secondary | ICD-10-CM | POA: Insufficient documentation

## 2010-09-04 DIAGNOSIS — Z760 Encounter for issue of repeat prescription: Secondary | ICD-10-CM | POA: Insufficient documentation

## 2010-09-04 DIAGNOSIS — R000 Tachycardia, unspecified: Secondary | ICD-10-CM | POA: Insufficient documentation

## 2010-09-04 DIAGNOSIS — R0989 Other specified symptoms and signs involving the circulatory and respiratory systems: Secondary | ICD-10-CM | POA: Insufficient documentation

## 2010-09-04 DIAGNOSIS — J4489 Other specified chronic obstructive pulmonary disease: Secondary | ICD-10-CM | POA: Insufficient documentation

## 2010-11-04 LAB — BLOOD GAS, ARTERIAL
Acid-Base Excess: 0.2
Acid-Base Excess: 10.6 — ABNORMAL HIGH
Acid-Base Excess: 4.5 — ABNORMAL HIGH
Acid-Base Excess: 6.3 — ABNORMAL HIGH
Acid-base deficit: 0.8
Bicarbonate: 22.5
Bicarbonate: 27.3 — ABNORMAL HIGH
Bicarbonate: 32.2 — ABNORMAL HIGH
Bicarbonate: 35.3 — ABNORMAL HIGH
Drawn by: 129801
Drawn by: 229971
Drawn by: 229971
Drawn by: 275531
Drawn by: 287601
Drawn by: 295031
FIO2: 0.21
FIO2: 0.3
FIO2: 0.3
FIO2: 0.3
FIO2: 0.5
MECHVT: 600
O2 Saturation: 93.7
O2 Saturation: 97.4
O2 Saturation: 98.4
O2 Saturation: 98.8
PEEP: 5
PEEP: 5
PEEP: 5
Patient temperature: 98.6
Patient temperature: 98.6
Patient temperature: 98.6
RATE: 8
RATE: 8
RATE: 8
RATE: 8
RATE: 8
TCO2: 19.7
TCO2: 31.8
pCO2 arterial: 30.9 — ABNORMAL LOW
pCO2 arterial: 45
pCO2 arterial: 45.1 — ABNORMAL HIGH
pH, Arterial: 7.504 — ABNORMAL HIGH
pO2, Arterial: 102 — ABNORMAL HIGH
pO2, Arterial: 123 — ABNORMAL HIGH
pO2, Arterial: 65.5 — ABNORMAL LOW
pO2, Arterial: 87
pO2, Arterial: 87.8
pO2, Arterial: 94

## 2010-11-04 LAB — HEPARIN LEVEL (UNFRACTIONATED)
Heparin Unfractionated: 0.1 — ABNORMAL LOW
Heparin Unfractionated: 0.28 — ABNORMAL LOW
Heparin Unfractionated: 0.49
Heparin Unfractionated: 0.49
Heparin Unfractionated: 0.86 — ABNORMAL HIGH
Heparin Unfractionated: 1.55 — ABNORMAL HIGH
Heparin Unfractionated: 1.68 — ABNORMAL HIGH

## 2010-11-04 LAB — COMPREHENSIVE METABOLIC PANEL
ALT: 18
ALT: 21
AST: 28
AST: 39 — ABNORMAL HIGH
Albumin: 2.7 — ABNORMAL LOW
Albumin: 3.3 — ABNORMAL LOW
Alkaline Phosphatase: 62
BUN: 3 — ABNORMAL LOW
Calcium: 8.1 — ABNORMAL LOW
Calcium: 9.2
Chloride: 93 — ABNORMAL LOW
Creatinine, Ser: 0.41
Creatinine, Ser: 0.48
Creatinine, Ser: 0.7
GFR calc Af Amer: >60
GFR calc Af Amer: >60
GFR calc non Af Amer: >60
GFR calc non Af Amer: >60
GFR calc non Af Amer: >60
Glucose, Bld: 177 — ABNORMAL HIGH
Glucose, Bld: 188 — ABNORMAL HIGH
Potassium: 3.9
Sodium: 120 — ABNORMAL LOW
Sodium: 129 — ABNORMAL LOW
Total Bilirubin: 0.7
Total Protein: 5.7 — ABNORMAL LOW
Total Protein: 8

## 2010-11-04 LAB — BASIC METABOLIC PANEL
BUN: 2 — ABNORMAL LOW
BUN: 4 — ABNORMAL LOW
BUN: 5 — ABNORMAL LOW
BUN: 8
BUN: 8
BUN: 9
CO2: 22
CO2: 29
CO2: 35 — ABNORMAL HIGH
CO2: 35 — ABNORMAL HIGH
CO2: 38 — ABNORMAL HIGH
Calcium: 7.8 — ABNORMAL LOW
Calcium: 7.9 — ABNORMAL LOW
Calcium: 8.1 — ABNORMAL LOW
Calcium: 8.2 — ABNORMAL LOW
Calcium: 8.4
Calcium: 8.4
Calcium: 8.7
Calcium: 9.1
Chloride: 93 — ABNORMAL LOW
Chloride: 102
Chloride: 91 — ABNORMAL LOW
Chloride: 94 — ABNORMAL LOW
Chloride: 96
Chloride: 96
Creatinine, Ser: 0.38 — ABNORMAL LOW
Creatinine, Ser: 0.47
Creatinine, Ser: 0.47
Creatinine, Ser: 0.57
Creatinine, Ser: 0.59
Creatinine, Ser: 0.67
GFR calc Af Amer: >60
GFR calc Af Amer: >60
GFR calc Af Amer: >60
GFR calc Af Amer: >60
GFR calc non Af Amer: >60
GFR calc non Af Amer: >60
GFR calc non Af Amer: >60
GFR calc non Af Amer: >60
GFR calc non Af Amer: >60
GFR calc non Af Amer: >60
Glucose, Bld: 89
Glucose, Bld: 127 — ABNORMAL HIGH
Glucose, Bld: 138 — ABNORMAL HIGH
Glucose, Bld: 145 — ABNORMAL HIGH
Glucose, Bld: 152 — ABNORMAL HIGH
Glucose, Bld: 74
Glucose, Bld: 99
Potassium: 3.5
Potassium: 3.6
Potassium: 3.7
Potassium: 3.8
Potassium: 3.9
Sodium: 126 — ABNORMAL LOW
Sodium: 126 — ABNORMAL LOW
Sodium: 132 — ABNORMAL LOW
Sodium: 136
Sodium: 136
Sodium: 137
Sodium: 138

## 2010-11-04 LAB — CARDIAC PANEL(CRET KIN+CKTOT+MB+TROPI)
CK, MB: 6 — ABNORMAL HIGH
Relative Index: 2.1
Relative Index: INVALID
Total CK: 283 — ABNORMAL HIGH
Troponin I: 0.11 — ABNORMAL HIGH
Troponin I: 0.35 — ABNORMAL HIGH
Troponin I: 0.89

## 2010-11-04 LAB — SEDIMENTATION RATE: Sed Rate: 15

## 2010-11-04 LAB — CBC
HCT: 41.7
HCT: 31.3 — ABNORMAL LOW
HCT: 33.2 — ABNORMAL LOW
HCT: 33.9 — ABNORMAL LOW
HCT: 34.2 — ABNORMAL LOW
HCT: 35.4 — ABNORMAL LOW
HCT: 35.6 — ABNORMAL LOW
Hemoglobin: 14.3
Hemoglobin: 10.1 — ABNORMAL LOW
Hemoglobin: 10.3 — ABNORMAL LOW
Hemoglobin: 10.7 — ABNORMAL LOW
Hemoglobin: 11.1 — ABNORMAL LOW
Hemoglobin: 11.1 — ABNORMAL LOW
Hemoglobin: 12
Hemoglobin: 12.1
MCHC: 34.3
MCHC: 32.6
MCHC: 33.4
MCHC: 33.5
MCHC: 33.7
MCHC: 33.8
MCHC: 34.1
MCHC: 34.2
MCHC: 34.6
MCV: 85.4
MCV: 86.2
MCV: 86.4
MCV: 86.9
MCV: 87
MCV: 87.3
MCV: 88.4
MCV: 88.6
MCV: 89.1
MCV: 89.2
Platelets: 188
Platelets: 202
Platelets: 205
Platelets: 224
Platelets: 233
Platelets: 242
Platelets: 251
Platelets: 257
Platelets: 269
RBC: 4.88
RBC: 3.72 — ABNORMAL LOW
RBC: 3.84 — ABNORMAL LOW
RDW: 13.1
RDW: 13
RDW: 13.2
RDW: 13.5
RDW: 13.8
RDW: 13.8
RDW: 13.9
RDW: 14
RDW: 14.2
WBC: 11.7 — ABNORMAL HIGH
WBC: 6.2
WBC: 6.6
WBC: 6.6
WBC: 9.1

## 2010-11-04 LAB — DIFFERENTIAL
Basophils Absolute: 0
Basophils Relative: 1
Eosinophils Absolute: 0
Eosinophils Relative: 0
Eosinophils Relative: 0
Lymphocytes Relative: 17
Lymphs Abs: 0.9
Monocytes Absolute: 0.6
Monocytes Absolute: 0.6
Monocytes Relative: 9
Monocytes Relative: 11

## 2010-11-04 LAB — AFB CULTURE WITH SMEAR (NOT AT ARMC)
Acid Fast Smear: NONE SEEN
Acid Fast Smear: NONE SEEN

## 2010-11-04 LAB — MAGNESIUM: Magnesium: 2

## 2010-11-04 LAB — INFLUENZA A+B VIRUS AG-DIRECT(RAPID): Inflenza A Ag: NEGATIVE

## 2010-11-04 LAB — HTLV I+II ANTIBODIES, (EIA), BLD: HTLV I/II Ab: NEGATIVE

## 2010-11-04 LAB — URINALYSIS, ROUTINE W REFLEX MICROSCOPIC
Glucose, UA: 250 — AB
Ketones, ur: NEGATIVE
Nitrite: NEGATIVE
Protein, ur: NEGATIVE
Urobilinogen, UA: 1

## 2010-11-04 LAB — URIC ACID: Uric Acid, Serum: 1.9 — ABNORMAL LOW

## 2010-11-04 LAB — OSMOLALITY, URINE: Osmolality, Ur: 173 — ABNORMAL LOW

## 2010-11-04 LAB — CULTURE, BLOOD (ROUTINE X 2): Culture: NO GROWTH

## 2010-11-04 LAB — LEGIONELLA ANTIGEN, URINE: Legionella Antigen, Urine: NEGATIVE

## 2010-11-04 LAB — STREP PNEUMONIAE URINARY ANTIGEN: Strep Pneumo Urinary Antigen: NEGATIVE

## 2011-08-14 ENCOUNTER — Other Ambulatory Visit (HOSPITAL_COMMUNITY): Payer: Self-pay | Admitting: Family Medicine

## 2011-08-14 DIAGNOSIS — R918 Other nonspecific abnormal finding of lung field: Secondary | ICD-10-CM

## 2011-08-18 ENCOUNTER — Other Ambulatory Visit (HOSPITAL_COMMUNITY): Payer: Self-pay

## 2011-10-28 ENCOUNTER — Emergency Department (HOSPITAL_COMMUNITY): Payer: Medicare Other

## 2011-10-28 ENCOUNTER — Inpatient Hospital Stay (HOSPITAL_COMMUNITY)
Admission: EM | Admit: 2011-10-28 | Discharge: 2011-11-02 | DRG: 193 | Disposition: A | Discharge status: Home Health | Payer: Medicare Other | Attending: Internal Medicine | Admitting: Internal Medicine

## 2011-10-28 ENCOUNTER — Encounter (HOSPITAL_COMMUNITY): Payer: Self-pay | Admitting: *Deleted

## 2011-10-28 ENCOUNTER — Inpatient Hospital Stay (HOSPITAL_COMMUNITY): Payer: Medicare Other

## 2011-10-28 DIAGNOSIS — J9601 Acute respiratory failure with hypoxia: Secondary | ICD-10-CM

## 2011-10-28 DIAGNOSIS — I509 Heart failure, unspecified: Secondary | ICD-10-CM | POA: Diagnosis present

## 2011-10-28 DIAGNOSIS — Z9089 Acquired absence of other organs: Secondary | ICD-10-CM

## 2011-10-28 DIAGNOSIS — F17200 Nicotine dependence, unspecified, uncomplicated: Secondary | ICD-10-CM | POA: Diagnosis present

## 2011-10-28 DIAGNOSIS — I10 Essential (primary) hypertension: Secondary | ICD-10-CM | POA: Diagnosis present

## 2011-10-28 DIAGNOSIS — I5022 Chronic systolic (congestive) heart failure: Secondary | ICD-10-CM | POA: Diagnosis present

## 2011-10-28 DIAGNOSIS — Z681 Body mass index (BMI) 19 or less, adult: Secondary | ICD-10-CM

## 2011-10-28 DIAGNOSIS — J441 Chronic obstructive pulmonary disease with (acute) exacerbation: Secondary | ICD-10-CM | POA: Diagnosis present

## 2011-10-28 DIAGNOSIS — J449 Chronic obstructive pulmonary disease, unspecified: Secondary | ICD-10-CM

## 2011-10-28 DIAGNOSIS — F1910 Other psychoactive substance abuse, uncomplicated: Secondary | ICD-10-CM | POA: Diagnosis present

## 2011-10-28 DIAGNOSIS — M069 Rheumatoid arthritis, unspecified: Secondary | ICD-10-CM | POA: Diagnosis present

## 2011-10-28 DIAGNOSIS — E871 Hypo-osmolality and hyponatremia: Secondary | ICD-10-CM | POA: Diagnosis present

## 2011-10-28 DIAGNOSIS — E876 Hypokalemia: Secondary | ICD-10-CM | POA: Diagnosis present

## 2011-10-28 DIAGNOSIS — E440 Moderate protein-calorie malnutrition: Secondary | ICD-10-CM | POA: Diagnosis present

## 2011-10-28 DIAGNOSIS — I252 Old myocardial infarction: Secondary | ICD-10-CM

## 2011-10-28 DIAGNOSIS — J439 Emphysema, unspecified: Secondary | ICD-10-CM | POA: Diagnosis present

## 2011-10-28 DIAGNOSIS — J9600 Acute respiratory failure, unspecified whether with hypoxia or hypercapnia: Secondary | ICD-10-CM | POA: Diagnosis present

## 2011-10-28 DIAGNOSIS — Z79899 Other long term (current) drug therapy: Secondary | ICD-10-CM

## 2011-10-28 DIAGNOSIS — Z23 Encounter for immunization: Secondary | ICD-10-CM

## 2011-10-28 DIAGNOSIS — D72829 Elevated white blood cell count, unspecified: Secondary | ICD-10-CM | POA: Diagnosis present

## 2011-10-28 DIAGNOSIS — F1210 Cannabis abuse, uncomplicated: Secondary | ICD-10-CM | POA: Diagnosis present

## 2011-10-28 DIAGNOSIS — R918 Other nonspecific abnormal finding of lung field: Secondary | ICD-10-CM | POA: Diagnosis present

## 2011-10-28 DIAGNOSIS — F1410 Cocaine abuse, uncomplicated: Secondary | ICD-10-CM | POA: Diagnosis present

## 2011-10-28 DIAGNOSIS — J189 Pneumonia, unspecified organism: Principal | ICD-10-CM | POA: Diagnosis present

## 2011-10-28 DIAGNOSIS — B370 Candidal stomatitis: Secondary | ICD-10-CM

## 2011-10-28 DIAGNOSIS — I959 Hypotension, unspecified: Secondary | ICD-10-CM

## 2011-10-28 HISTORY — DX: Bronchitis, not specified as acute or chronic: J40

## 2011-10-28 HISTORY — DX: Rheumatoid arthritis, unspecified: M06.9

## 2011-10-28 HISTORY — DX: Acute myocardial infarction, unspecified: I21.9

## 2011-10-28 LAB — CBC WITH DIFFERENTIAL/PLATELET
Basophils Relative: 0 % (ref 0–1)
Eosinophils Absolute: 0 10*3/uL (ref 0.0–0.7)
HCT: 33.2 % — ABNORMAL LOW (ref 36.0–46.0)
Hemoglobin: 12.1 g/dL (ref 12.0–15.0)
Lymphs Abs: 1.5 10*3/uL (ref 0.7–4.0)
MCH: 27.8 pg (ref 26.0–34.0)
MCHC: 36.4 g/dL — ABNORMAL HIGH (ref 30.0–36.0)
MCV: 76.3 fL — ABNORMAL LOW (ref 78.0–100.0)
Monocytes Absolute: 1.1 10*3/uL — ABNORMAL HIGH (ref 0.1–1.0)
Monocytes Relative: 9 % (ref 3–12)
Neutrophils Relative %: 79 % — ABNORMAL HIGH (ref 43–77)
RBC: 4.35 MIL/uL (ref 3.87–5.11)

## 2011-10-28 LAB — COMPREHENSIVE METABOLIC PANEL
Albumin: 2.4 g/dL — ABNORMAL LOW (ref 3.5–5.2)
Alkaline Phosphatase: 112 U/L (ref 39–117)
BUN: <3 mg/dL — ABNORMAL LOW (ref 6–23)
Potassium: 3.3 mEq/L — ABNORMAL LOW (ref 3.5–5.1)
Sodium: 119 mEq/L — CL (ref 135–145)
Total Protein: 6.7 g/dL (ref 6.0–8.3)

## 2011-10-28 LAB — URINALYSIS, ROUTINE W REFLEX MICROSCOPIC
Glucose, UA: NEGATIVE mg/dL
Ketones, ur: 40 mg/dL — AB
Protein, ur: 30 mg/dL — AB

## 2011-10-28 LAB — EXPECTORATED SPUTUM ASSESSMENT W GRAM STAIN, RFLX TO RESP C: Special Requests: NORMAL

## 2011-10-28 LAB — PROCALCITONIN: Procalcitonin: 0.21 ng/mL

## 2011-10-28 LAB — RAPID URINE DRUG SCREEN, HOSP PERFORMED
Amphetamines: NOT DETECTED
Opiates: NOT DETECTED
Tetrahydrocannabinol: NOT DETECTED

## 2011-10-28 LAB — MAGNESIUM: Magnesium: 1.7 mg/dL (ref 1.5–2.5)

## 2011-10-28 LAB — PROTIME-INR: INR: 1.4 (ref 0.00–1.49)

## 2011-10-28 LAB — MRSA PCR SCREENING: MRSA by PCR: NEGATIVE

## 2011-10-28 LAB — APTT: aPTT: 43 seconds — ABNORMAL HIGH (ref 24–37)

## 2011-10-28 LAB — URINE MICROSCOPIC-ADD ON

## 2011-10-28 LAB — PHOSPHORUS: Phosphorus: 3.4 mg/dL (ref 2.3–4.6)

## 2011-10-28 MED ORDER — AZITHROMYCIN 500 MG IV SOLR
500.0000 mg | INTRAVENOUS | Status: DC
  Administered 2011-10-29 – 2011-11-01 (×4): 500 mg via INTRAVENOUS
  Filled 2011-10-28 (×7): qty 500

## 2011-10-28 MED ORDER — ACETAMINOPHEN 325 MG PO TABS
650.0000 mg | ORAL_TABLET | Freq: Four times a day (QID) | ORAL | Status: DC | PRN
  Administered 2011-10-29 – 2011-10-31 (×4): 650 mg via ORAL
  Filled 2011-10-28 (×4): qty 2

## 2011-10-28 MED ORDER — ALBUTEROL SULFATE (5 MG/ML) 0.5% IN NEBU
2.5000 mg | INHALATION_SOLUTION | Freq: Four times a day (QID) | RESPIRATORY_TRACT | Status: DC | PRN
  Administered 2011-10-29: 2.5 mg via RESPIRATORY_TRACT

## 2011-10-28 MED ORDER — ACETAMINOPHEN 325 MG PO TABS
650.0000 mg | ORAL_TABLET | Freq: Once | ORAL | Status: AC
  Administered 2011-10-28: 650 mg via ORAL
  Filled 2011-10-28: qty 2

## 2011-10-28 MED ORDER — SODIUM CHLORIDE 0.9 % IJ SOLN
3.0000 mL | Freq: Two times a day (BID) | INTRAMUSCULAR | Status: DC
  Administered 2011-10-28 – 2011-11-02 (×8): 3 mL via INTRAVENOUS

## 2011-10-28 MED ORDER — IPRATROPIUM BROMIDE 0.02 % IN SOLN: 0.5000 mg | Freq: Four times a day (QID) | RESPIRATORY_TRACT | Status: DC | PRN

## 2011-10-28 MED ORDER — PNEUMOCOCCAL VAC POLYVALENT 25 MCG/0.5ML IJ INJ
0.5000 mL | INJECTION | INTRAMUSCULAR | Status: AC
  Administered 2011-10-29: 0.5 mL via INTRAMUSCULAR
  Filled 2011-10-28: qty 0.5

## 2011-10-28 MED ORDER — DEXTROSE 5 % IV SOLN
1.0000 g | INTRAVENOUS | Status: DC
  Administered 2011-10-29 – 2011-11-01 (×4): 1 g via INTRAVENOUS
  Filled 2011-10-28 (×7): qty 10

## 2011-10-28 MED ORDER — SODIUM CHLORIDE 0.9 % IV BOLUS (SEPSIS)
1000.0000 mL | Freq: Once | INTRAVENOUS | Status: AC
  Administered 2011-10-28: 1000 mL via INTRAVENOUS

## 2011-10-28 MED ORDER — SODIUM CHLORIDE 0.9 % IV SOLN
INTRAVENOUS | Status: DC
  Administered 2011-10-28: 15:00:00 via INTRAVENOUS
  Administered 2011-10-28: 999 mL/h via INTRAVENOUS
  Administered 2011-10-28 (×2): via INTRAVENOUS
  Administered 2011-10-29: 50 mL/h via INTRAVENOUS

## 2011-10-28 MED ORDER — HYDROCODONE-ACETAMINOPHEN 5-325 MG PO TABS
1.0000 | ORAL_TABLET | ORAL | Status: DC | PRN
  Administered 2011-10-28 – 2011-11-01 (×7): 1 via ORAL
  Filled 2011-10-28 (×5): qty 1
  Filled 2011-10-28: qty 2
  Filled 2011-10-28: qty 1

## 2011-10-28 MED ORDER — TIOTROPIUM BROMIDE MONOHYDRATE 18 MCG IN CAPS
18.0000 ug | ORAL_CAPSULE | Freq: Every day | RESPIRATORY_TRACT | Status: DC
  Administered 2011-10-29: 18 ug via RESPIRATORY_TRACT
  Filled 2011-10-28: qty 5

## 2011-10-28 MED ORDER — MOXIFLOXACIN HCL IN NACL 400 MG/250ML IV SOLN
400.0000 mg | Freq: Once | INTRAVENOUS | Status: AC
  Administered 2011-10-28: 400 mg via INTRAVENOUS
  Filled 2011-10-28: qty 250

## 2011-10-28 MED ORDER — ACETAMINOPHEN 650 MG RE SUPP: 650.0000 mg | Freq: Four times a day (QID) | RECTAL | Status: DC | PRN

## 2011-10-28 MED ORDER — IOHEXOL 300 MG/ML  SOLN
80.0000 mL | Freq: Once | INTRAMUSCULAR | Status: AC | PRN
  Administered 2011-10-28: 80 mL via INTRAVENOUS

## 2011-10-28 MED ORDER — INFLUENZA VIRUS VACC SPLIT PF IM SUSP
0.5000 mL | INTRAMUSCULAR | Status: AC
  Administered 2011-10-29: 0.5 mL via INTRAMUSCULAR
  Filled 2011-10-28: qty 0.5

## 2011-10-28 NOTE — ED Notes (Signed)
Report called to 2E

## 2011-10-28 NOTE — ED Notes (Signed)
Pt alert and oriented with family at bedside. Pt sts she has been feeling ill "for a while." Patient was brought to [facility] by counselor from her school. Pt has wet sounding cough upon assessment- pt and family state she has had this cough for a while- that it is non productive and that she was supposed to quit smoking but has not yet.

## 2011-10-28 NOTE — ED Provider Notes (Signed)
History     CSN: [id number]  Arrival date & time 10/28/11  1223   First MD Initiated Contact with Patient 10/28/11 1337      Chief Complaint  Patient presents with  . Weakness  . Weight Loss    (Consider location/radiation/quality/duration/timing/severity/associated sxs/prior treatment) HPI Comments: Jennifer Hurley is a 56 y.o. female who was at her counselor today for followup of crack, cocaine abuse, when, counselor, noticed that she was having trouble breathing, so sent her here. Patient has ongoing, cough, that is productive of sputum with yellow color. She has lost dozens of pounds in the last several months. She "feels terrible." Beyond that she is a vague historian. She states that she smokes cigarettes occasionally. There no known. Palliative or aggravating factors. She denies headache, chest pain, shortness of breath, back, pain, or dizziness.  Patient is a 56 y.o. female presenting with weakness. The history is provided by the patient.  Weakness  Additional symptoms include weakness.    Past Medical History  Diagnosis Date  . Rheumatoid arthritis   . Bronchitis   . Myocardial infarction     Past Surgical History  Procedure Date  . Tonsillectomy   . Tubal ligation     History reviewed. No pertinent family history.  History  Substance Use Topics  . Smoking status: Current Every Day Smoker -- 1.0 packs/day for 39 years  . Smokeless tobacco: Never Used  . Alcohol Use: Yes     quart of beer per day    OB History    Grav Para Term Preterm Abortions TAB SAB Ect Mult Living                  Review of Systems  Neurological: Positive for weakness.  All other systems reviewed and are negative.    Allergies  Review of patient's allergies indicates no known allergies.  Home Medications   No current outpatient prescriptions on file.  BP 100/70  Pulse 120  Temp 97.7 F (36.5 C) (Oral)  Resp 18  Ht 5\' 1"  (1.549 m)  Wt 83 lb 15.9 oz (38.1 kg)  BMI  15.87 kg/m2  SpO2 100%  Physical Exam  Nursing note and vitals reviewed. Constitutional: She is oriented to person, place, and time. She appears well-developed.       She is frail and malnourished  HENT:  Head: Normocephalic and atraumatic.  Eyes: Conjunctivae normal and EOM are normal. Pupils are equal, round, and reactive to light.  Neck: Normal range of motion and phonation normal. Neck supple.  Cardiovascular: Regular rhythm and intact distal pulses.        Tachycardic  Pulmonary/Chest: Effort normal. She exhibits no tenderness.       Decreased breath sounds bilaterally. No audible wheezes, rales, or rhonchi  Abdominal: Soft. She exhibits no distension. There is no tenderness. There is no guarding.  Musculoskeletal: Normal range of motion.  Neurological: She is alert and oriented to person, place, and time. She has normal strength. She exhibits normal muscle tone.  Skin: Skin is warm and dry.  Psychiatric: Her behavior is normal.       Appears depressed.    ED Course  Procedures (including critical care time)   Rectal temperature elevated. Tylenol given.  ED treatment: IV fluid bolus, and drip. Screening for routine sources of infection.     Labs Reviewed  CBC WITH DIFFERENTIAL - Abnormal; Notable for the following:    WBC 12.7 (*)     HCT 33.2 (*)  MCV 76.3 (*)     MCHC 36.4 (*)     Platelets 419 (*)     Neutrophils Relative 79 (*)     Neutro Abs 10.1 (*)     Monocytes Absolute 1.1 (*)     All other components within normal limits  URINALYSIS, ROUTINE W REFLEX MICROSCOPIC - Abnormal; Notable for the following:    Color, Urine AMBER (*)  BIOCHEMICALS MAY BE AFFECTED BY COLOR   Hgb urine dipstick MODERATE (*)     Bilirubin Urine SMALL (*)     Ketones, ur 40 (*)     Protein, ur 30 (*)     Urobilinogen, UA 4.0 (*)     All other components within normal limits  COMPREHENSIVE METABOLIC PANEL - Abnormal; Notable for the following:    Sodium 119 (*)     Potassium  3.3 (*)     Chloride 75 (*)     Glucose, Bld 100 (*)     BUN <3 (*)     Creatinine, Ser 0.30 (*)     Albumin 2.4 (*)     All other components within normal limits  APTT - Abnormal; Notable for the following:    aPTT 43 (*)     All other components within normal limits  PROTIME-INR - Abnormal; Notable for the following:    Prothrombin Time 16.8 (*)     All other components within normal limits  PRO B NATRIURETIC PEPTIDE - Abnormal; Notable for the following:    Pro B Natriuretic peptide (BNP) 133.2 (*)     All other components within normal limits  COMPREHENSIVE METABOLIC PANEL - Abnormal; Notable for the following:    Sodium 123 (*)     Potassium 2.8 (*)     Chloride 89 (*)  DELTA CHECK NOTED   Glucose, Bld 110 (*)     BUN <3 (*)  CONSISTENT WITH PREVIOUS RESULT   Creatinine, Ser 0.26 (*)     Calcium 7.5 (*)     Albumin 2.1 (*)     AST 61 (*)     All other components within normal limits  CBC - Abnormal; Notable for the following:    WBC 16.2 (*)     HCT 34.2 (*)     MCV 77.2 (*)     MCHC 36.3 (*)     All other components within normal limits  BASIC METABOLIC PANEL - Abnormal; Notable for the following:    Sodium 122 (*)     Potassium 2.7 (*)     Chloride 87 (*)     Glucose, Bld 180 (*)     BUN <3 (*)     Creatinine, Ser 0.30 (*)     Calcium 7.4 (*)     All other components within normal limits  CBC - Abnormal; Notable for the following:    RBC 3.67 (*)     Hemoglobin 10.6 (*)  REPEATED TO VERIFY   HCT 28.2 (*)     MCV 76.8 (*)     MCHC 36.2 (*)     Platelets 408 (*)     All other components within normal limits  BASIC METABOLIC PANEL - Abnormal; Notable for the following:    Sodium 120 (*)     Potassium 2.7 (*)     Chloride 85 (*)     Glucose, Bld 177 (*)     BUN <3 (*)     Creatinine, Ser 0.22 (*)     Calcium  7.3 (*)     All other components within normal limits  PHOSPHORUS - Abnormal; Notable for the following:    Phosphorus 1.8 (*)     All other  components within normal limits  MAGNESIUM - Abnormal; Notable for the following:    Magnesium 1.1 (*)     All other components within normal limits  OSMOLALITY, URINE - Abnormal; Notable for the following:    Osmolality, Ur 163 (*)     All other components within normal limits  OSMOLALITY - Abnormal; Notable for the following:    Osmolality 253 (*)     All other components within normal limits  GLUCOSE, CAPILLARY - Abnormal; Notable for the following:    Glucose-Capillary 173 (*)     All other components within normal limits  BASIC METABOLIC PANEL - Abnormal; Notable for the following:    Sodium 122 (*)     Chloride 88 (*)     Glucose, Bld 193 (*)     BUN <3 (*)     Creatinine, Ser 0.26 (*)     Calcium 7.7 (*)     All other components within normal limits  URINE RAPID DRUG SCREEN (HOSP PERFORMED) - Abnormal; Notable for the following:    Opiates POSITIVE (*)     All other components within normal limits  CBC - Abnormal; Notable for the following:    RBC 3.42 (*)     Hemoglobin 9.5 (*)     HCT 26.3 (*)     MCV 76.9 (*)     MCHC 36.1 (*)     Platelets 455 (*)     All other components within normal limits  BASIC METABOLIC PANEL - Abnormal; Notable for the following:    Sodium 124 (*)     Chloride 90 (*)     Glucose, Bld 124 (*)     BUN <3 (*)     Creatinine, Ser 0.27 (*)     Calcium 8.0 (*)     All other components within normal limits  URINE CULTURE  URINE RAPID DRUG SCREEN (HOSP PERFORMED)  ETHANOL  URINE MICROSCOPIC-ADD ON  PROCALCITONIN  LACTIC ACID, PLASMA  MAGNESIUM  PHOSPHORUS  TSH  CULTURE, BLOOD (ROUTINE X 2)  CULTURE, BLOOD (ROUTINE X 2)  CULTURE, EXPECTORATED SPUTUM-ASSESSMENT  HIV ANTIBODY (ROUTINE TESTING)  LEGIONELLA ANTIGEN, URINE  STREP PNEUMONIAE URINARY ANTIGEN  INFLUENZA PANEL BY PCR  MRSA PCR SCREENING  GLUCOSE, CAPILLARY  SODIUM, URINE, RANDOM  CREATININE, URINE, RANDOM  MAGNESIUM  GRAM STAIN   Dg Chest Port 1 View  10/30/2011   *RADIOLOGY REPORT*  Clinical Data: Pneumonia.  PORTABLE CHEST - 1 VIEW  Comparison: Chest 10/29/2011 and 10/28/2011.  Findings: Dense consolidation in the right upper lobe shows some improvement over the past 2 days.  More patchy airspace opacity in the right mid and lower lung zones appears unchanged since yesterday's study.  Streaky left basilar opacity has improved. Heart size is normal.  Lungs are severely emphysematous.  No pneumothorax.  Very small right pleural effusion is noted.  IMPRESSION: Improved appearance of right side pneumonia.  Aeration in the left lung base also has improved.   Original Report Authenticated By: Bernadene Bell. D'ALESSIO, M.D.      1. Community acquired pneumonia   2. Hyponatremia   3. Hypotension   4. Candidiasis of mouth   5. Hypokalemia   6. Chronic airway obstruction, not elsewhere classified   7. Acute respiratory failure with hypoxia   8. Unspecified  essential hypertension       MDM  Weakness, and weight loss, with hyponatremia. Suspect poor nutrition. Rule out cancer. She'll need to be admitted for management. She has a fever without clear source.         Flint Melter, MD 10/31/11 1353

## 2011-10-28 NOTE — ED Notes (Signed)
Family states "She is just sick" reports weight loss and weakness x's 3 weeks.

## 2011-10-28 NOTE — H&P (Addendum)
[facility] History and Physical  Jennifer Hurley ZOX:[id number] DOB: 04-17-1955 DOA: 10/28/2011  Referring physician: ER physician PCP: No primary provider on file.   Chief Complaint: difficulty breathing  HPI:  56  Year old female with history of crack cocaine abuse, smoker, presented to ED with complaints of worsening breathing over past week associated with cough productive of yellowish sputum, subjective fever, chills. Patient also reported unintentional weight loss in associated to poor appetite, weakness and fatigue. No complaints of abdominal pain, no chest pain, no dizziness or lightheadedness or loss of consciousness.  Assessment and Plan:  Principal Problem:  *CAP (community acquired pneumonia) - concern for possible lung malignancy - we will obtain ct chest with contrast - for now continue azithromycin and ceftriaxone - follow up pneumonia order set labs - check procalcitonin and lactic acid level - admission to SDU  Active Problems:  COPD - continue spiriva - atrovent PRN   Leukocytosis - secondary to community acquired pneumonia - follow up blood culture results - follow up labs associated with pneumonia order set - continue azithromycin and ceftriaxone   Hyponatremia - unclear etiology although potential SIADH due to possible malignancy - check TSH - continue IV fluids - follow up BMP in am   Hypokalemia - repleted in ED - follow up am BMP   Systolic CHF, chronic - based on 2 D ECHO in 2009, EF of ~55% - check BNP level  Moderate protein calorie malnutrition - encouraged po intake, protein supplementation  Code Status: Full Family Communication: Pt at bedside Disposition Plan: Admit for further evaluation  Manson Passey, MD  [facility] Pager [phone]  If 7PM-7AM, please contact night-coverage [email or web address] Password [email or web address] 10/28/2011, 5:36 PM  Review of Systems:  Constitutional: positive for for fever, chills and malaise/fatigue. Negative for  diaphoresis.  HENT: Negative for hearing loss, ear pain, nosebleeds, congestion, sore throat, neck pain, tinnitus and ear discharge.   Eyes: Negative for blurred vision, double vision, photophobia, pain, discharge and redness.  Respiratory: per HPI.   Cardiovascular: Negative for chest pain, palpitations, orthopnea, claudication and leg swelling.  Gastrointestinal: Negative for nausea, vomiting and abdominal pain. Negative for heartburn, constipation, blood in stool and melena.  Genitourinary: Negative for dysuria, urgency, frequency, hematuria and flank pain.  Musculoskeletal: Negative for myalgias, back pain, joint pain and falls.  Skin: Negative for itching and rash.  Neurological: Negative for dizziness and weakness. Negative for tingling, tremors, sensory change, speech change, focal weakness, loss of consciousness and headaches.  Endo/Heme/Allergies: Negative for environmental allergies and polydipsia. Does not bruise/bleed easily.  Psychiatric/Behavioral: Negative for suicidal ideas. The patient is not nervous/anxious.      Past Medical History  Diagnosis Date  . Rheumatoid arthritis   . Bronchitis   . Myocardial infarction    Past Surgical History  Procedure Date  . Tonsillectomy   . Tubal ligation    Social History:  reports that she has been smoking.  She has never used smokeless tobacco. She reports that she drinks alcohol. She reports that she uses illicit drugs (Cocaine and Marijuana).  No Known Allergies  Family History: htn in mother  Prior to Admission medications   Medication Sig Start Date End Date Taking? Authorizing Provider  ibuprofen (ADVIL,MOTRIN) 200 MG tablet Take 200 mg by mouth every 6 (six) hours as needed. Pain   Yes Historical Provider, MD  salmeterol (SEREVENT) 50 MCG/DOSE diskus inhaler Inhale 1 puff into the lungs 2 (two) times daily.   Yes Historical Provider, MD  tiotropium (SPIRIVA) 18 MCG inhalation capsule Place 18 mcg into inhaler and inhale  daily.   Yes Historical Provider, MD   Physical Exam: Filed Vitals:   10/28/11 1630 10/28/11 1645 10/28/11 1700 10/28/11 1715  BP: 80/52 84/55 79/56  77/47  Pulse: 95 95 114 113  Temp:      TempSrc:      Resp: 16 21 16 18   SpO2: 100% 100% 100% 100%    Physical Exam  Constitutional: ill appearing, no acute distress HENT: Normocephalic. autraumatic  Eyes: Conjunctivae and EOM are normal. PERRLA, no scleral icterus.  Neck: Normal ROM. Neck supple. No JVD. No tracheal deviation. No thyromegaly.  CVS: RRR, S1/S2 +, no murmurs, no gallops, no carotid bruit.  Pulmonary:  Rhonchi appreciated over upper and mid lung lobes Abdominal: Soft. BS +,  no distension, tenderness, rebound or guarding.  Musculoskeletal: Normal range of motion. No edema and no tenderness.  Lymphadenopathy: No lymphadenopathy noted, cervical, inguinal. Neuro: Alert. Normal reflexes, muscle tone coordination. No cranial nerve deficit. Skin: Skin is warm and dry. No rash noted. Not diaphoretic. No erythema. No pallor.  Psychiatric: Normal mood and affect. Behavior, judgment, thought content normal.   Labs on Admission:  Basic Metabolic Panel:  Lab 10/28/11 1610  NA 119*  K 3.3*  CL 75*  CO2 26  GLUCOSE 100*  BUN <3*  CREATININE 0.30*  CALCIUM 8.8   Liver Function Tests:  Lab 10/28/11 1340  AST 24  ALT 7  ALKPHOS 112  BILITOT 0.4  PROT 6.7  ALBUMIN 2.4*   CBC:  Lab 10/28/11 1340  WBC 12.7*  HGB 12.1  HCT 33.2*  MCV 76.3*  PLT 419*    Radiological Exams on Admission: Dg Chest Portable 1 View 10/28/2011  * IMPRESSION:  1.  Right upper lobe pneumonia versus carcinoma.  Recommend CT thorax with contrast. 2.  Emphysematous change.       EKG: Normal tachycardia

## 2011-10-28 NOTE — ED Notes (Signed)
Patient reports have diarrhea for "about a week" starting last Tuesday.  She said the diarrhea ended on Monday.

## 2011-10-29 ENCOUNTER — Inpatient Hospital Stay (HOSPITAL_COMMUNITY): Payer: Medicare Other

## 2011-10-29 DIAGNOSIS — J9601 Acute respiratory failure with hypoxia: Secondary | ICD-10-CM | POA: Diagnosis present

## 2011-10-29 LAB — BASIC METABOLIC PANEL
BUN: <3 mg/dL — ABNORMAL LOW (ref 6–23)
CO2: 24 mEq/L (ref 19–32)
Calcium: 7.4 mg/dL — ABNORMAL LOW (ref 8.4–10.5)
Creatinine, Ser: 0.3 mg/dL — ABNORMAL LOW (ref 0.50–1.10)

## 2011-10-29 LAB — COMPREHENSIVE METABOLIC PANEL
BUN: <3 mg/dL — ABNORMAL LOW (ref 6–23)
CO2: 21 mEq/L (ref 19–32)
Calcium: 7.5 mg/dL — ABNORMAL LOW (ref 8.4–10.5)
Creatinine, Ser: 0.26 mg/dL — ABNORMAL LOW (ref 0.50–1.10)
GFR calc Af Amer: >90 mL/min (ref >90)
GFR calc non Af Amer: >90 mL/min (ref >90)
Glucose, Bld: 110 mg/dL — ABNORMAL HIGH (ref 70–99)
Total Bilirubin: 0.4 mg/dL (ref 0.3–1.2)

## 2011-10-29 LAB — INFLUENZA PANEL BY PCR (TYPE A & B)
H1N1 flu by pcr: NOT DETECTED
Influenza B By PCR: NEGATIVE

## 2011-10-29 LAB — HIV ANTIBODY (ROUTINE TESTING W REFLEX): HIV: NONREACTIVE

## 2011-10-29 LAB — TSH: TSH: 1.179 u[IU]/mL (ref 0.350–4.500)

## 2011-10-29 LAB — CBC
Hemoglobin: 12.4 g/dL (ref 12.0–15.0)
Platelets: 354 10*3/uL (ref 150–400)
RBC: 4.43 MIL/uL (ref 3.87–5.11)

## 2011-10-29 LAB — GLUCOSE, CAPILLARY: Glucose-Capillary: 84 mg/dL (ref 70–99)

## 2011-10-29 LAB — STREP PNEUMONIAE URINARY ANTIGEN: Strep Pneumo Urinary Antigen: NEGATIVE

## 2011-10-29 LAB — LEGIONELLA ANTIGEN, URINE

## 2011-10-29 MED ORDER — FUROSEMIDE 10 MG/ML IJ SOLN
40.0000 mg | Freq: Once | INTRAMUSCULAR | Status: AC
  Administered 2011-10-29: 40 mg via INTRAVENOUS
  Filled 2011-10-29: qty 4

## 2011-10-29 MED ORDER — ALBUTEROL SULFATE (5 MG/ML) 0.5% IN NEBU: 2.5000 mg | INHALATION_SOLUTION | RESPIRATORY_TRACT | Status: DC | PRN

## 2011-10-29 MED ORDER — VANCOMYCIN HCL 1000 MG IV SOLR
750.0000 mg | INTRAVENOUS | Status: DC
  Administered 2011-10-29 – 2011-10-31 (×3): 750 mg via INTRAVENOUS
  Filled 2011-10-29 (×4): qty 750

## 2011-10-29 MED ORDER — POTASSIUM CHLORIDE 10 MEQ/100ML IV SOLN
10.0000 meq | INTRAVENOUS | Status: AC
  Administered 2011-10-29 (×3): 10 meq via INTRAVENOUS
  Filled 2011-10-29 (×3): qty 100

## 2011-10-29 MED ORDER — SODIUM CHLORIDE 0.9 % IV BOLUS (SEPSIS)
500.0000 mL | Freq: Once | INTRAVENOUS | Status: AC
  Administered 2011-10-29: 500 mL via INTRAVENOUS

## 2011-10-29 MED ORDER — BOOST / RESOURCE BREEZE PO LIQD
1.0000 | Freq: Three times a day (TID) | ORAL | Status: DC
  Administered 2011-10-29 – 2011-11-02 (×9): 1 via ORAL

## 2011-10-29 MED ORDER — POTASSIUM CHLORIDE CRYS ER 20 MEQ PO TBCR
40.0000 meq | EXTENDED_RELEASE_TABLET | Freq: Once | ORAL | Status: DC
  Filled 2011-10-29: qty 2

## 2011-10-29 MED ORDER — ALBUTEROL SULFATE (5 MG/ML) 0.5% IN NEBU
INHALATION_SOLUTION | RESPIRATORY_TRACT | Status: AC
  Filled 2011-10-29: qty 0.5

## 2011-10-29 MED ORDER — POTASSIUM CHLORIDE 10 MEQ/100ML IV SOLN
10.0000 meq | INTRAVENOUS | Status: AC
  Administered 2011-10-29 (×3): 10 meq via INTRAVENOUS

## 2011-10-29 MED ORDER — POTASSIUM CHLORIDE 10 MEQ/100ML IV SOLN
INTRAVENOUS | Status: AC
  Filled 2011-10-29: qty 300

## 2011-10-29 MED ORDER — METHYLPREDNISOLONE SODIUM SUCC 125 MG IJ SOLR
80.0000 mg | Freq: Three times a day (TID) | INTRAMUSCULAR | Status: DC
  Administered 2011-10-29 – 2011-10-30 (×3): 80 mg via INTRAVENOUS
  Filled 2011-10-29 (×2): qty 2
  Filled 2011-10-29: qty 1.28
  Filled 2011-10-29 (×2): qty 2
  Filled 2011-10-29 (×2): qty 1.28

## 2011-10-29 MED ORDER — LORAZEPAM 2 MG/ML IJ SOLN
0.5000 mg | Freq: Once | INTRAMUSCULAR | Status: AC
  Administered 2011-10-29: 0.5 mg via INTRAVENOUS
  Filled 2011-10-29: qty 1

## 2011-10-29 MED ORDER — ALBUTEROL SULFATE (5 MG/ML) 0.5% IN NEBU
2.5000 mg | INHALATION_SOLUTION | RESPIRATORY_TRACT | Status: DC
  Administered 2011-10-29 (×2): 2.5 mg via RESPIRATORY_TRACT
  Filled 2011-10-29 (×2): qty 0.5

## 2011-10-29 MED ORDER — IPRATROPIUM BROMIDE 0.02 % IN SOLN
0.5000 mg | Freq: Four times a day (QID) | RESPIRATORY_TRACT | Status: DC
  Administered 2011-10-29 – 2011-11-02 (×17): 0.5 mg via RESPIRATORY_TRACT
  Filled 2011-10-29 (×16): qty 2.5

## 2011-10-29 MED ORDER — IPRATROPIUM BROMIDE 0.02 % IN SOLN
RESPIRATORY_TRACT | Status: AC
  Administered 2011-10-30: 0.5 mg via RESPIRATORY_TRACT
  Filled 2011-10-29: qty 2.5

## 2011-10-29 MED ORDER — ALBUTEROL SULFATE (5 MG/ML) 0.5% IN NEBU
2.5000 mg | INHALATION_SOLUTION | Freq: Four times a day (QID) | RESPIRATORY_TRACT | Status: DC
  Administered 2011-10-29 – 2011-11-02 (×15): 2.5 mg via RESPIRATORY_TRACT
  Filled 2011-10-29 (×16): qty 0.5

## 2011-10-29 NOTE — Progress Notes (Signed)
INITIAL ADULT NUTRITION ASSESSMENT Date: 10/29/2011   Time: 9:36 AM Reason for Assessment: Nutrition risk-weight loss with decreased appetite and intake  ASSESSMENT: Female 56 y.o.  Dx: CAP (community acquired pneumonia), smoker, crack cocaine user (last 1 month PTA)  Hx:  Past Medical History  Diagnosis Date  . Rheumatoid arthritis   . Bronchitis   . Myocardial infarction    Past Surgical History  Procedure Date  . Tonsillectomy   . Tubal ligation    Related Meds:     . acetaminophen  650 mg Oral Once  . albuterol  2.5 mg Nebulization Q6H  . albuterol      . azithromycin  500 mg Intravenous Q24H  . cefTRIAXone (ROCEPHIN)  IV  1 g Intravenous Q24H  . furosemide  40 mg Intravenous Once  . influenza  inactive virus vaccine  0.5 mL Intramuscular Tomorrow-1000  . ipratropium      . ipratropium  0.5 mg Nebulization Q6H  . LORazepam  0.5 mg Intravenous Once  . moxifloxacin  400 mg Intravenous Once  . pneumococcal 23 valent vaccine  0.5 mL Intramuscular Tomorrow-1000  . potassium chloride  10 mEq Intravenous Q1 Hr x 3  . potassium chloride  10 mEq Intravenous Q1 Hr x 3  . potassium chloride      . potassium chloride  40 mEq Oral Once  . sodium chloride  1,000 mL Intravenous Once  . sodium chloride  1,000 mL Intravenous Once  . sodium chloride  3 mL Intravenous Q12H  . DISCONTD: albuterol  2.5 mg Nebulization Q4H  . DISCONTD: tiotropium  18 mcg Inhalation Daily    Ht: 5\' 1"  (154.9 cm)  Wt: 83 lb 15.9 oz (38.1 kg)  Ideal Wt: 47.8 kg  % Ideal Wt: 80  Usual Wt:  Wt Readings from Last 10 Encounters:  10/29/11 83 lb 15.9 oz (38.1 kg)  02/27/08 87 lb (39.463 kg)  01/19/08 90 lb 6.1 oz (40.997 kg)  11/03/07 88 lb (39.917 kg)   % Usual Wt: recent unknown Body mass index is 15.87 kg/(m^2).   Labs:  CMP     Component Value Date/Time   NA 123* 10/29/2011 0320   K 2.8* 10/29/2011 0320   CL 89* 10/29/2011 0320   CO2 21 10/29/2011 0320   GLUCOSE 110* 10/29/2011 0320   BUN <3* 10/29/2011 0320   CREATININE 0.26* 10/29/2011 0320   CALCIUM 7.5* 10/29/2011 0320   PROT 6.3 10/29/2011 0320   ALBUMIN 2.1* 10/29/2011 0320   AST 61* 10/29/2011 0320   ALT 17 10/29/2011 0320   ALKPHOS 90 10/29/2011 0320   BILITOT 0.4 10/29/2011 0320   GFRNONAA >90 10/29/2011 0320   GFRAA >90 10/29/2011 0320    I/O last 3 completed shifts: In: 2570.8 [P.O.:960; I.V.:1310.8; IV Piggyback:300] Out: 1250 [Urine:1250] Total I/O In: 999 [P.O.:120; I.V.:75; Other:600; IV Piggyback:204] Out: -   Diet Order: Clear Liquid  Supplements/Tube Feeding:  none  IVF:    sodium chloride Last Rate: 50 mL/hr (10/29/11 0911)    Estimated Nutritional Needs:   Kcal: 1350-1450 Protein: 55-65 gm Fluid: >1.4L  Food/Nutrition Related Hx: Pt with recent crack cocaine use.  80% IBW.  Underweight, Severe Malnutrition related to social AEB severely decreased muscle mass and body fat as well as poor intake over the past month due to drug use.  Clear liquid diet.  Taking some. Sitting straight up in bed.  Significant work to breath.  NUTRITION DIAGNOSIS: -Inadequate oral intake (NI-2.1).  Status: Ongoing  RELATED  TO: poor appetite and difficulty breathing  AS EVIDENCE BY: observation, CL diet.  MONITORING/EVALUATION(Goals): Goal:  Tolerate diet advancement with >75% intake meals and supplements Monitor:  Diet, weight, labs, intake  EDUCATION NEEDS: -No education needs identified at this time  INTERVENTION: Resource Breeze tid Monitor diet advancement Pt at risk for refeeding syndrome.  Monitor potassium, magnesium, phosphorus  Dietitian [phone]  DOCUMENTATION CODES Per approved criteria  -Severe  malnutrition in the context of social or environmental circumstances -Underweight    Francely Craw, Anastasia Fiedler 10/29/2011, 9:36 AM

## 2011-10-29 NOTE — Progress Notes (Addendum)
[facility] PROGRESS NOTE  Jennifer Hurley [id number]:[id number] DOB: [birth year] DOA: 10/28/2011 PCP: No primary provider on file.  Brief narrative: 56 Year old female with history of crack cocaine abuse, smoker, presented to ED with complaints of worsening shortness of breath, productive cough and unintentional weight loss. Patient found to have right sided pneumonia.  Assessment and Plan:   Principal Problem:  *CAP (community acquired pneumonia)  - ct chest with stable lung nodule and right sided pneumonia - continue antibiotics, azithromycin, rocephin, avelox and vanco - follow up blood and respiratory culture results - added solumedrol 80 mg Q 8 hours IV - appreciate pulmonary following  Active Problems:  Acute exacerbation of COPD  - nebulizer treatments scheduled and prn - oxygen support via nasal canula to keep O2 saturation above 90% - may need NIV - antibiotics for possible pneumonia - added solumedrol 80 mg Q 8 hours IV - pulmonary toilet  Leukocytosis  - secondary to community acquired pneumonia  - follow up blood culture results  - collect sputum culture again, 1st sample not good  - continue azithromycin and ceftriaxone  - added avelox and vanco  Hyponatremia  - unclear etiology although potential SIADH  - TSH pending - D/C IV fluids - follow up BMP in am   Hypokalemia  - repleted in ED and today - follow up am BMP   Systolic CHF, chronic  - based on 2 D ECHO in 2009, EF of ~55%  - BNP only mildly elevated - given 1 dose of lasix  Code Status: Full  Family Communication: at bedside Disposition Plan: pending D/C plan  Manson Passey, MD  [facility] Pager [phone]  If 7PM-7AM, please contact night-coverage [email or web address] Password [email or web address] 10/29/2011, 12:31 PM   LOS: 1 day   Consultants:  Pulmonary   Procedures:  None   Antibiotics:  Azithromycin 10/28/2011 -->  Ceftriaxone 10/28/2011 -->  Avelox 10/29/2011 -->  Vancomycin 10/29/2011  -->  HPI/Subjective: More short of breath today.  Objective: Filed Vitals:   10/29/11 0816 10/29/11 0900 10/29/11 1152 10/29/11 1153  BP:  96/75    Pulse:    117  Temp:  100.9 F (38.3 C) 101.5 F (38.6 C) 101.5 F (38.6 C)  TempSrc:      Resp:  28    Height:      Weight:      SpO2: 96%       Intake/Output Summary (Last 24 hours) at 10/29/11 1231 Last data filed at 10/29/11 1100  Gross per 24 hour  Intake 3719.83 ml  Output   1250 ml  Net 2469.83 ml    Exam:   General:  Pt is alert, follows commands appropriately, not in acute distress  Cardiovascular: Regular rate and rhythm, S1/S2, no murmurs, no rubs, no gallops  Respiratory: rhonchi upper and mid lung lobes  Abdomen: Soft, non tender, non distended, bowel sounds present, no guarding  Extremities: No edema, pulses DP and PT palpable bilaterally  Neuro: Grossly nonfocal  Data Reviewed: Basic Metabolic Panel:  Lab 10/29/11 1308 10/28/11 2046 10/28/11 1340  NA 123* -- 119*  K 2.8* -- 3.3*  CL 89* -- 75*  CO2 21 -- 26  GLUCOSE 110* -- 100*  BUN <3* -- <3*  CREATININE 0.26* -- 0.30*  CALCIUM 7.5* -- 8.8  MG -- 1.7 --  PHOS -- 3.4 --   Liver Function Tests:  Lab 10/29/11 0320 10/28/11 1340  AST 61* 24  ALT 17 7  ALKPHOS 90 112  BILITOT  0.4 0.4  PROT 6.3 6.7  ALBUMIN 2.1* 2.4*   CBC:  Lab 10/29/11 0320 10/28/11 1340  WBC 16.2* 12.7*  HGB 12.4 12.1  HCT 34.2* 33.2*  MCV 77.2* 76.3*  PLT 354 419*   CBG:  Lab 10/29/11 0751  GLUCAP 84    Recent Results (from the past 240 hour(s))  MRSA PCR SCREENING     Status: Normal   Collection Time   10/28/11  6:33 PM      Component Value Range Status Comment   MRSA by PCR NEGATIVE  NEGATIVE Final   CULTURE, EXPECTORATED SPUTUM-ASSESSMENT     Status: Normal   Collection Time   10/28/11  8:02 PM      Component Value Range Status Comment   Specimen Description SPUTUM   Final    Special Requests Normal   Final    Sputum evaluation     Final     Value: MICROSCOPIC FINDINGS SUGGEST THAT THIS SPECIMEN IS NOT REPRESENTATIVE OF LOWER RESPIRATORY SECRETIONS. PLEASE RECOLLECT.     Gram Stain Report Called to,Read Back By and Verified With: [facility] RN [id number] 10/28/11 A NAVARRO   Report Status 10/28/2011 FINAL   Final      Studies: Ct Chest W Contrast 10/28/2011  *  IMPRESSION: Findings consistent with extensive right lung pneumonia, primarily right upper lobe. Possible developing right upper lobe pulmonary abscess.  Small to moderate right pleural effusion.  No definite proximal obstructing lesion.  Follow-up radiographs should be obtained until the infiltrate clears.  If concern persists for neoplasm, PET scan recommended.    Dg Chest Port 1 View 10/29/2011  * IMPRESSION:  1.  No marked change in right upper lobe pneumonia. 2.  New airspace opacities in the mid and lower lung zones, greater on the right, could be due to progressive pneumonia and/or edema. 3.  Emphysema.     Dg Chest Portable 1 View 10/28/2011  *  IMPRESSION:  1.  Right upper lobe pneumonia versus carcinoma.  Recommend CT thorax with contrast. 2.  Emphysematous change.      Scheduled Meds:   . acetaminophen  650 mg Oral Once  . albuterol  2.5 mg Nebulization Q6H  . azithromycin  500 mg Intravenous Q24H  . cefTRIAXone (ROCEPHIN)  IV  1 g Intravenous Q24H  . feeding supplement  1 Container Oral TID BM  . furosemide  40 mg Intravenous Once  . ipratropium  0.5 mg Nebulization Q6H  . LORazepam  0.5 mg Intravenous Once  . moxifloxacin  400 mg Intravenous Once  . potassium chloride  10 mEq Intravenous Q1 Hr x 3   Continuous Infusions:   . sodium chloride 50 mL/hr (10/29/11 0911)

## 2011-10-29 NOTE — Progress Notes (Signed)
Event: Notified by RN that pt noted w/ acute increased WOB and worsening bil rhonchi. Frequent coughing. Pt very anxious and sitting in Tripod-like position to breathe. RN reported that pt is maintaining 02 sats at 96-98% on 3L [place]. Requesting order for PCXR. Stat PCXR and Ativan 0.5mg  IV ordered. NP to beside. Subjective: Pt arouses easily and  reports her breathing is better and denies CP. Objective: Record indicates pt admitted yesterday after presenting to ED w/ week long hx of increasing SOB, productive cough, fever and chills. Ct chest w/ cm revealed extensive RUL PNA w/ possible developing abscess vs neoplasm and (R) pleural effusion. Generalized COPD changes were also noted.  At bedside pt noted resting in bed in NAD acute distress. Somewhat more relaxed now and improved WOB. BBS w/ significant coarse rhonchi bil, R>L and upper airway congestion.  No wheezing or crackles noted.  Pt has good cough effort. VS: BP-128/95 (though pt has been persistently hypotensive through-out the evening 77/47-91/59) , P-126 (has remained intermittently tachycardic since admission), R-24 w/ 02 sats @ 98% on 3L [place]. Recent oral temp 97.4. Stat PCXR shows 1. RUL PNA unchgd, 2. New airspace opacities in mid and lower lobes R>L c/w progressive PNA and/or edema. 3. Emphysema. BNP from 2100 on 10/28/11 was 133.2.  Assessment/Plan: 1. Acute respiratory distress associated w/ increased coughing and thick secretions (improved). 02 sats maintained WNL on 3L [place]. Favor worsening PNA vs pulmonary edema. Continue IV Rocephin, Zithromax and albuterol nebs. Will decrease IVF's to 75 cc/hr. RT to provide flutter-valve therapy. Will continue to monitor closely.  Leanne Chang, NP-C [facility] Pager: [phone]

## 2011-10-29 NOTE — Progress Notes (Addendum)
ANTIBIOTIC CONSULT NOTE - INITIAL  Pharmacy Consult for Vancomycin Indication: suspected PNA   No Known Allergies  Patient Measurements: Height: 5\' 1"  (154.9 cm) Weight: 83 lb 15.9 oz (38.1 kg) IBW/kg (Calculated) : 47.8   Vital Signs: Temp: 99.3 F (37.4 C) (09/19 1330) Temp src: Oral (09/19 0400) BP: 75/61 mmHg (09/19 1330) Pulse Rate: 127  (09/19 1330) Intake/Output from previous day: 09/18 0701 - 09/19 0700 In: 2570.8 [P.O.:960; I.V.:1310.8; IV Piggyback:300] Out: 1250 [Urine:1250] Intake/Output from this shift: Total I/O In: 1149 [P.O.:120; I.V.:125; Other:600; IV Piggyback:304] Out: -   Labs:  Basename 10/29/11 0320 10/28/11 1340  WBC 16.2* 12.7*  HGB 12.4 12.1  PLT 354 419*  LABCREA -- --  CREATININE 0.26* 0.30*   Estimated Creatinine Clearance: 48.4 ml/min (by C-G formula based on Cr of 0.26). No results found for this basename: VANCOTROUGH:2,VANCOPEAK:2,VANCORANDOM:2,GENTTROUGH:2,GENTPEAK:2,GENTRANDOM:2,TOBRATROUGH:2,TOBRAPEAK:2,TOBRARND:2,AMIKACINPEAK:2,AMIKACINTROU:2,AMIKACIN:2, in the last 72 hours   Medical History: Past Medical History  Diagnosis Date  . Rheumatoid arthritis   . Bronchitis   . Myocardial infarction    Assessment:  38 yof with presented to ED 9/18 with c/o worsening breathing over past week associated with cough productive of yellowish sputum, subjective fever, chills. Patient also reported unintentional weight loss in associated to poor appetite, weakness and fatigue. RUL PNA on CXR - started on Azithro/Ceftriaxone. MD now broaden antibiotic given Tmax 101.5, adding Vancomycin.  Need to re-culture sputum, pending urine and blood cxs. Leukocytosis. Febrile.  Scr low, CrCl ~48 ml/min, Wt 38kg  Goal of Therapy:  Vancomycin trough level 15-20 mcg/ml  Plan:   Vancomycin 750 mg IV q24h  Would check VT at steady state if Vanc continues  Pharmacy will f/u  Geoffry Paradise Thi 10/29/2011,1:57 PM

## 2011-10-29 NOTE — Progress Notes (Signed)
CARE MANAGEMENT NOTE 10/29/2011  Patient:  AVRI, GRAEBER   Account Number:  [id number]  Date Initiated:  10/29/2011  Documentation initiated by:  Kunaal Walkins  Subjective/Objective Assessment:   pt admitted due to increased wob, smoker coccaine abuse , cxr rt lll pna, poss sepsis     Action/Plan:   lives at home   Anticipated DC Date:  11/01/2011   Anticipated DC Plan:  HOME/SELF CARE  In-house referral  NA      DC Planning Services  NA      [facility] Choice  NA   Choice offered to / List presented to:  NA   DME arranged  NA      DME agency  NA     HH arranged  NA      HH agency  NA   Status of service:  In process, will continue to follow Medicare Important Message given?  NA - LOS <3 / Initial given by admissions (If response is "NO", the following Medicare IM given date fields will be blank) Date Medicare IM given:   Date Additional Medicare IM given:    Discharge Disposition:    Per UR Regulation:  Reviewed for med. necessity/level of care/duration of stay  If discussed at Long Length of Stay Meetings, dates discussed:    Comments:  [id number] Earlene Plater, RN, BSN, CCM: CHART REVIEWED AND UPDATED. NO DISCHARGE NEEDS PRESENT AT THIS TIME. CASE MANAGEMENT [phone]

## 2011-10-29 NOTE — Consult Note (Signed)
Name: Jennifer Hurley MRN: [id number] DOB: 03-Aug-1955    LOS: 1  Referring Provider: Elisabeth Pigeon Reason for Referral:  Evolving respiratory failure, pneumonia.  PULMONARY / CRITICAL CARE MEDICINE  HPI:  56 yo (appears older) smoker, crack cocaine user -last 1 month PTA (per pt), severe COPD -FEv1 30% 4/09,  who presented 9/18 with > 2 weeks cough with yellow sputum, subjective fevers, weight loss, increasing respiratory distress. Imaging showed RUL sense consolidation with some cavitation. Labs - hyponatremia  PCCM asked to evaluate for increasing respiratory distress. Last seen by dr Marchelle Gearing 1/10. Last CT 4/10 showed stable nodules from 4/09  Past Medical History:  1. Acute exacerbation of chronic obstructive pulmonary disease and  ventilator dependent respiratory failure. - 05/25/2007 to 06/09/2007. Equivocal eval for VCD  2. Hyponatremia. - april 2009 - resolved  3. Hyperglycemia. - april 2009 - resolved  4. Altered mental status. - apirl 2009. resolved  5. Elevated cardiac enzymes. - resolved  6. Hypertension.  7. Congestive heart failure. - 2D echo on May 27, 2007, which showed severe  hypokinesis with a ventricular ejection rate of 30%. A repeat 2-D  echo on June 08, 2007, showed ejection fraction of 55% with a mild  AVR and with IVC being flat. Suspected TAKA-SUBO by Dr. Teressa Lower  8. Pulmonary nodules bilateral upper lobes ............................................................Marland KitchenRamaswamy  -> 6-43mm April 2009,  -> unchanged Oct 2009  10.COPD ................................................................................................................Marland KitchenRamaswamy  -> on advair hfa and spiriva  -> discharge Fev1 - 30% in April 2009  -> asymptomatic and did not desaturate with exertion JAn 2010.  11. HIV negative 01/2008 at [facility]  12. Atypical Chest pains - cardiac strss test 01/2008 - result pending.  13. CAchexia   Past Medical History  Diagnosis Date  . Rheumatoid  arthritis   . Bronchitis   . Myocardial infarction    Past Surgical History  Procedure Date  . Tonsillectomy   . Tubal ligation    Prior to Admission medications   Medication Sig Start Date End Date Taking? Authorizing Provider  ibuprofen (ADVIL,MOTRIN) 200 MG tablet Take 200 mg by mouth every 6 (six) hours as needed. Pain   Yes Historical Provider, MD  salmeterol (SEREVENT) 50 MCG/DOSE diskus inhaler Inhale 1 puff into the lungs 2 (two) times daily.   Yes Historical Provider, MD  tiotropium (SPIRIVA) 18 MCG inhalation capsule Place 18 mcg into inhaler and inhale daily.   Yes Historical Provider, MD   Allergies No Known Allergies  Family History History reviewed. No pertinent family history. Social History  reports that she has been smoking.  She has never used smokeless tobacco. She reports that she drinks alcohol. She reports that she uses illicit drugs (Cocaine and Marijuana).  Review Of Systems: Taken but she is a poor historian due to illness.  Brief patient description:  Cachetic AAF with increased wob.  Events Since Admission: 9/18 admitted to SDU  Current Status: C/o dyspnea, no hermoptysis, tachycardic  Vital Signs: Temp:  [97.4 F (36.3 C)-101.3 F (38.5 C)] 98.1 F (36.7 C) (09/19 0400) Pulse Rate:  [92-135] 126  (09/19 0400) Resp:  [14-23] 23  (09/19 0400) BP: (77-128)/(47-95) 128/95 mmHg (09/19 0400) SpO2:  [92 %-100 %] 98 % (09/19 0400) Weight:  [38.1 kg (83 lb 15.9 oz)-39.8 kg (87 lb 11.9 oz)] 38.1 kg (83 lb 15.9 oz) (09/19 0000)  Physical Examination: General:  cachectic , AAF in acute distress Neuro:  Dull affect otherwise intact HEENT:  No LAN, oral mucosa dry Neck:  No  JVD appreciated  Cardiovascular:  HSR RRR Lungs:  Coarse rhonchi bilaterally. Increased wob. Rt anterior crackles, increased vocal resonance Abdomen: +bs Musculoskeletal:  intact Skin:  Warm and without edema  Principal Problem:  *CAP (community acquired pneumonia) Active  Problems:  CANDIDIASIS, MOUTH (THRUSH)  SUBSTANCE ABUSE, MULTIPLE  HYPERTENSION  COPD  Leukocytosis  Hyponatremia  Hypokalemia  Systolic CHF, chronic  Ct Chest W Contrast  10/28/2011  *RADIOLOGY REPORT*  Clinical Data: Shortness of breath and cough.  CT CHEST WITH CONTRAST  Technique:  Multidetector CT imaging of the chest was performed following the standard protocol during bolus administration of intravenous contrast.  Contrast: 80mL OMNIPAQUE IOHEXOL 300 MG/ML  SOLN  Comparison: CT chest 05/23/2008.  One-view portable chest earlier today.  Findings: Extensive consolidation in the right upper lobe without visible proximal obstructing mass or hilar/mediastinal adenopathy. Possible pulmonary abscess laterally with intrapulmonary collection of fluid and air versus fluid-filled large emphysematous bleb (image 23, 16 x 29 mm).  Faint airspace opacities right lower lobe and right middle lobe.  Small to moderate right pleural effusion. Generalized COPD changes.  Normal heart size.  Three-vessel coronary artery calcification.  No pericardial effusion.  No acute osseous findings.  Negative upper abdominal structures.  Trachea midline.  No neck masses.  IMPRESSION: Findings consistent with extensive right lung pneumonia, primarily right upper lobe. Possible developing right upper lobe pulmonary abscess.  Small to moderate right pleural effusion.  No definite proximal obstructing lesion.  Follow-up radiographs should be obtained until the infiltrate clears.  If concern persists for neoplasm, PET scan recommended.   Original Report Authenticated By: Elsie Stain, M.D.    Dg Chest Port 1 View  10/29/2011  *RADIOLOGY REPORT*  Clinical Data: Worsening shortness of breath.  PORTABLE CHEST - 1 VIEW  Comparison: CT and plain film chest 10/28/2011.  Findings: Lungs are severely emphysematous.  Right upper lobe airspace disease persists with worsening aeration in the mid and lower lung zones bilaterally particularly  on the right.  Heart size is normal.  IMPRESSION:  1.  No marked change in right upper lobe pneumonia. 2.  New airspace opacities in the mid and lower lung zones, greater on the right, could be due to progressive pneumonia and/or edema. 3.  Emphysema.   Original Report Authenticated By: Bernadene Bell. D'ALESSIO, M.D.    Dg Chest Portable 1 View  10/28/2011  *RADIOLOGY REPORT*  Clinical Data: Weakness and weight loss  PORTABLE CHEST - 1 VIEW  Comparison: Chest radiograph 09/04/2010,, CT 05/23/2008  Findings: There is a new irregular mass lesion in the right upper lobe measuring 7 cm x 7 cm.  Differential for this mass would be neoplasm or infection.  The cardiac silhouette is normal.  Lungs are hyperinflated.  There is bullous change in the apices.  IMPRESSION:  1.  Right upper lobe pneumonia versus carcinoma.  Recommend CT thorax with contrast. 2.  Emphysematous change.   Original Report Authenticated By: Genevive Bi, M.D.     Lab 10/29/11 0320 10/28/11 1340  NA 123* 119*  K 2.8* 3.3*  CL 89* 75*  CO2 21 26  BUN <3* <3*  CREATININE 0.26* 0.30*  GLUCOSE 110* 100*    Lab 10/29/11 0320 10/28/11 1340  HGB 12.4 12.1  HCT 34.2* 33.2*  WBC 16.2* 12.7*  PLT 354 419*   BNP (last 3 results)  Basename 10/28/11 2052  PROBNP 133.2*   ABG    Component Value Date/Time   PHART 7.541* 06/06/2007 1735  PCO2ART 30.9* 06/06/2007 1735   PO2ART 65.5* 06/06/2007 1735   HCO3 26.4* 06/06/2007 1735   TCO2 23.1 06/06/2007 1735   ACIDBASEDEF 0.8 05/28/2007 0830   O2SAT 93.7 06/06/2007 1735   Micro: 9/18 bc x 2>> 9/18 sputum>> Urine strep ag neg  ABX: 9/18 roc>> 9/18 zithro>> 9/18 avelox>>9/18  ASSESSMENT AND PLAN Acute respiratory failure in setting of right upper lobe dense pna, bullous emphysema, tobacco and crack cocaine abuse - CAP vs aspiration Severe COPD -Abx as per flow, add vanc until MRSA ruled out, rpt sputum cx -await culture data -supplemental O2 as needed -pulmonary toilet,  albuterol/ atrovent nebs q 6h , no bspasm -Doubt need for FOB here, Note nodules were stable from 4/09  & no increase in lt apical scar on current CT  -May need NIV if increased WOB -she declines HIV testing , neg '09  Hyponatremia/Hyperkalemia  Lab 10/29/11 0320 10/28/11 1340  NA 123* 119*    Lab 10/29/11 0320 10/28/11 1340  K 2.8* 3.3*     -monitor lytes/replete as needed  CHF -2D echo on May 27, 2007, which showed severe hypokinesis with a ventricular ejection rate of 30%. A repeat 2-D echo on June 08, 2007, showed ejection fraction of 55% with a mild AVR and with IVC being flat. Suspected TAKA-SUBO by Dr. Teressa Lower  -judicious use of IVF -consider CVL placememt to monitor fluid status  HTN  - hold antihypertensive  Brett Canales Minor ACNP [facility] PCCM Pager [phone] till 3 pm If no answer page [phone] 10/29/2011, 8:17 AM  Care during the described time interval was provided by me and/or other providers on the critical care team.  I have reviewed this patient's available data, including medical history, events of note, physical examination and test results as part of my evaluation  Trine Fread V.  [phone]

## 2011-10-30 ENCOUNTER — Inpatient Hospital Stay (HOSPITAL_COMMUNITY): Payer: Medicare Other

## 2011-10-30 DIAGNOSIS — I10 Essential (primary) hypertension: Secondary | ICD-10-CM

## 2011-10-30 LAB — CBC
HCT: 28.2 % — ABNORMAL LOW (ref 36.0–46.0)
Hemoglobin: 10.6 g/dL — ABNORMAL LOW (ref 12.0–15.0)
MCH: 27.8 pg (ref 26.0–34.0)
MCV: 76.8 fL — ABNORMAL LOW (ref 78.0–100.0)
RBC: 3.67 MIL/uL — ABNORMAL LOW (ref 3.87–5.11)
WBC: 10.1 10*3/uL (ref 4.0–10.5)

## 2011-10-30 LAB — BASIC METABOLIC PANEL
BUN: <3 mg/dL — ABNORMAL LOW (ref 6–23)
BUN: <3 mg/dL — ABNORMAL LOW (ref 6–23)
CO2: 24 mEq/L (ref 19–32)
CO2: 22 mEq/L (ref 19–32)
Calcium: 7.3 mg/dL — ABNORMAL LOW (ref 8.4–10.5)
Calcium: 7.7 mg/dL — ABNORMAL LOW (ref 8.4–10.5)
Chloride: 85 mEq/L — ABNORMAL LOW (ref 96–112)
Chloride: 88 mEq/L — ABNORMAL LOW (ref 96–112)
Creatinine, Ser: 0.22 mg/dL — ABNORMAL LOW (ref 0.50–1.10)
Creatinine, Ser: 0.26 mg/dL — ABNORMAL LOW (ref 0.50–1.10)
GFR calc Af Amer: >90 mL/min (ref >90)
GFR calc Af Amer: >90 mL/min (ref >90)
GFR calc non Af Amer: >90 mL/min (ref >90)
GFR calc non Af Amer: >90 mL/min (ref >90)
Glucose, Bld: 177 mg/dL — ABNORMAL HIGH (ref 70–99)
Glucose, Bld: 193 mg/dL — ABNORMAL HIGH (ref 70–99)
Potassium: 2.7 mEq/L — CL (ref 3.5–5.1)
Potassium: 3.8 mEq/L (ref 3.5–5.1)
Sodium: 120 mEq/L — ABNORMAL LOW (ref 135–145)
Sodium: 122 mEq/L — ABNORMAL LOW (ref 135–145)

## 2011-10-30 LAB — RAPID URINE DRUG SCREEN, HOSP PERFORMED
Barbiturates: NOT DETECTED
Benzodiazepines: NOT DETECTED
Cocaine: NOT DETECTED

## 2011-10-30 LAB — CREATININE, URINE, RANDOM: Creatinine, Urine: 27.1 mg/dL

## 2011-10-30 LAB — OSMOLALITY: Osmolality: 253 mOsm/kg — ABNORMAL LOW (ref 275–300)

## 2011-10-30 LAB — GLUCOSE, CAPILLARY: Glucose-Capillary: 173 mg/dL — ABNORMAL HIGH (ref 70–99)

## 2011-10-30 LAB — URINE CULTURE
Colony Count: NO GROWTH
Culture: NO GROWTH

## 2011-10-30 LAB — OSMOLALITY, URINE: Osmolality, Ur: 163 mOsm/kg — ABNORMAL LOW (ref 390–1090)

## 2011-10-30 LAB — MAGNESIUM: Magnesium: 1.1 mg/dL — ABNORMAL LOW (ref 1.5–2.5)

## 2011-10-30 MED ORDER — DEMECLOCYCLINE HCL 150 MG PO TABS
150.0000 mg | ORAL_TABLET | Freq: Two times a day (BID) | ORAL | Status: DC
  Administered 2011-10-30 – 2011-11-02 (×7): 150 mg via ORAL
  Filled 2011-10-30 (×8): qty 1

## 2011-10-30 MED ORDER — INFLUENZA VIRUS VACC SPLIT PF IM SUSP: 0.5000 mL | INTRAMUSCULAR | Status: DC

## 2011-10-30 MED ORDER — POTASSIUM CHLORIDE CRYS ER 20 MEQ PO TBCR
40.0000 meq | EXTENDED_RELEASE_TABLET | ORAL | Status: AC
  Administered 2011-10-30: 40 meq via ORAL
  Filled 2011-10-30: qty 2

## 2011-10-30 MED ORDER — GUAIFENESIN-DM 100-10 MG/5ML PO SYRP
5.0000 mL | ORAL_SOLUTION | ORAL | Status: DC | PRN
  Administered 2011-10-30 – 2011-11-02 (×8): 5 mL via ORAL
  Filled 2011-10-30 (×8): qty 10

## 2011-10-30 MED ORDER — MAGNESIUM SULFATE 40 MG/ML IJ SOLN
2.0000 g | Freq: Once | INTRAMUSCULAR | Status: AC
  Administered 2011-10-30: 2 g via INTRAVENOUS
  Filled 2011-10-30: qty 50

## 2011-10-30 MED ORDER — PNEUMOCOCCAL VAC POLYVALENT 25 MCG/0.5ML IJ INJ: 0.5000 mL | INJECTION | Freq: Once | INTRAMUSCULAR | Status: DC

## 2011-10-30 MED ORDER — POTASSIUM CHLORIDE 10 MEQ/100ML IV SOLN
10.0000 meq | INTRAVENOUS | Status: AC
Start: 2011-10-30 — End: 2011-10-30
  Administered 2011-10-30: 10 meq via INTRAVENOUS
  Filled 2011-10-30: qty 100

## 2011-10-30 MED ORDER — POTASSIUM CHLORIDE 10 MEQ/100ML IV SOLN
10.0000 meq | INTRAVENOUS | Status: AC
  Administered 2011-10-30 (×3): 10 meq via INTRAVENOUS
  Filled 2011-10-30 (×3): qty 100

## 2011-10-30 MED ORDER — ALUM & MAG HYDROXIDE-SIMETH 200-200-20 MG/5ML PO SUSP
15.0000 mL | ORAL | Status: DC | PRN
  Administered 2011-10-30: 15 mL via ORAL
  Filled 2011-10-30: qty 30

## 2011-10-30 NOTE — Progress Notes (Signed)
Physical Therapy Evaluation Patient Details Name: Jennifer Hurley MRN: [id number] DOB: 02-08-1956 Today's Date: 10/30/2011 Time: 9528-4132 PT Time Calculation (min): 21 min  PT Assessment / Plan / Recommendation Clinical Impression  Pt. admitted w/ pneumonia. Pt lives alone. Pt will benefit from PT to return to independent level to DC to home.    PT Assessment  Patient needs continued PT services    Follow Up Recommendations  No PT follow up    Barriers to Discharge Decreased caregiver support      Equipment Recommendations  None recommended by PT    Recommendations for Other Services OT consult   Frequency Min 3X/week    Precautions / Restrictions Precautions Precautions: Fall Precaution Comments: monitor VS   Pertinent Vitals/Pain Sats on RA 97% for mobility. HR 115      Mobility  Bed Mobility Bed Mobility: Supine to Sit Supine to Sit: 5: Supervision;With rails;HOB elevated Details for Bed Mobility Assistance: pt  takes extra time and rest break. Transfers Transfers: Sit to Stand;Stand to Sit Sit to Stand: 4: Min assist;From bed Stand to Sit: To chair/3-in-1;4: Min guard Details for Transfer Assistance: VC for safety, pt impulsive Ambulation/Gait Ambulation/Gait Assistance: 4: Min assist Ambulation Distance (Feet): 5 Feet Assistive device: Rolling walker Ambulation/Gait Assistance Details: Pt. required direction for activity, pt impulsive,  Gait Pattern: Step-to pattern;Decreased stride length    Exercises     PT Diagnosis: Difficulty walking;Generalized weakness  PT Problem List: Decreased strength;Decreased activity tolerance;Decreased mobility;Cardiopulmonary status limiting activity PT Treatment Interventions: Gait training;Functional mobility training;Therapeutic activities;Patient/family education   PT Goals Acute Rehab PT Goals PT Goal Formulation: With patient Time For Goal Achievement: 11/13/11 Potential to Achieve Goals: Good Pt will go Sit to  Stand: Independently PT Goal: Sit to Stand - Progress: Goal set today Pt will go Stand to Sit: Independently PT Goal: Stand to Sit - Progress: Goal set today Pt will Ambulate: >150 feet;Independently PT Goal: Ambulate - Progress: Goal set today  Visit Information  Last PT Received On: 10/30/11 Assistance Needed: +2    Subjective Data  Subjective: I can't sit in that chair. Patient Stated Goal: I want to walk.   Prior Functioning  Home Living Lives With: Alone Type of Home: Apartment Home Access: Level entry Home Layout: One level Bathroom Shower/Tub: Engineer, manufacturing systems: Standard Home Adaptive Equipment: None Prior Function Level of Independence: Independent Communication Communication: No difficulties    Cognition  Overall Cognitive Status: Appears within functional limits for tasks assessed/performed Arousal/Alertness: Awake/alert Orientation Level: Appears intact for tasks assessed Behavior During Session: Anxious Cognition - Other Comments: impulsive    Extremity/Trunk Assessment Right Upper Extremity Assessment RUE ROM/Strength/Tone: WFL for tasks assessed Left Upper Extremity Assessment LUE ROM/Strength/Tone: WFL for tasks assessed Right Lower Extremity Assessment RLE ROM/Strength/Tone: [facility] for tasks assessed Left Lower Extremity Assessment LLE ROM/Strength/Tone: WFL for tasks assessed   Balance Static Sitting Balance Static Sitting - Balance Support: Bilateral upper extremity supported;Feet supported Static Sitting - Level of Assistance: 5: Stand by assistance  End of Session PT - End of Session Activity Tolerance: Patient limited by fatigue Patient left: in chair;with call bell/phone within reach;with nursing in room Nurse Communication: Mobility status  GP     [name] 10/30/2011, 12:50 PM [phone]

## 2011-10-30 NOTE — Progress Notes (Signed)
Name: Jennifer Hurley MRN: [id number] DOB: 05-06-1955    LOS: 2  Referring Provider: Elisabeth Pigeon Reason for Referral:  Evolving respiratory failure, pneumonia.  PULMONARY / CRITICAL CARE MEDICINE  HPI:  56 yo (appears older) smoker, crack cocaine user -last 1 month PTA (per pt), severe COPD -FEv1 30% 4/09,  who presented 9/18 with > 2 weeks cough with yellow sputum, subjective fevers, weight loss, increasing respiratory distress. Imaging showed RUL sense consolidation with some cavitation. Labs - hyponatremia  PCCM asked to evaluate for increasing respiratory distress. Last seen by dr Marchelle Gearing 1/10. Last CT 4/10 showed stable nodules from 4/09  Past Medical History:  1. Acute exacerbation of chronic obstructive pulmonary disease and  ventilator dependent respiratory failure. - 05/25/2007 to 06/09/2007. Equivocal eval for VCD  2. Hyponatremia. - april 2009 - resolved  3. Hyperglycemia. - april 2009 - resolved  4. Altered mental status. - apirl 2009. resolved  5. Elevated cardiac enzymes. - resolved  6. Hypertension.  7. Congestive heart failure. - 2D echo on May 27, 2007, which showed severe  hypokinesis with a ventricular ejection rate of 30%. A repeat 2-D  echo on June 08, 2007, showed ejection fraction of 55% with a mild  AVR and with IVC being flat. Suspected TAKA-SUBO by Dr. Teressa Lower  8. Pulmonary nodules bilateral upper lobes ............................................................Marland KitchenRamaswamy  -> 6-2mm April 2009,  -> unchanged Oct 2009  10.COPD ................................................................................................................Marland KitchenRamaswamy  -> on advair hfa and spiriva  -> discharge Fev1 - 30% in April 2009  -> asymptomatic and did not desaturate with exertion JAn 2010.  11. HIV negative 01/2008 at [facility]  12. Atypical Chest pains - cardiac strss test 01/2008 - result pending.  13. CAchexia   Events Since Admission: 9/18 admitted to  SDU  Current Status: C/o dyspnea -improved, no hermoptysis defervesced  Vital Signs: Temp:  [96.6 F (35.9 C)-102 F (38.9 C)] 97.3 F (36.3 C) (09/20 0630) Pulse Rate:  [96-127] 101  (09/20 0630) Resp:  [14-28] 19  (09/20 0630) BP: (73-123)/(52-112) 100/73 mmHg (09/20 0630) SpO2:  [95 %-100 %] 95 % (09/20 9629)  Physical Examination: General:  cachectic , AAF in no distress Neuro:  Dull affect otherwise non focal, interactive HEENT:  No LAN, oral mucosa dry Neck:  No JVD appreciated  Cardiovascular:  HSR RRR Lungs:  Coarse rhonchi bilaterally. Increased wob. Rt anterior crackles, increased vocal resonance Abdomen: soft, non tender, +bs Musculoskeletal:  intact Skin:  Warm and without edema  Principal Problem:  *CAP (community acquired pneumonia) Active Problems:  CANDIDIASIS, MOUTH (THRUSH)  SUBSTANCE ABUSE, MULTIPLE  HYPERTENSION  COPD  Leukocytosis  Hyponatremia  Hypokalemia  Systolic CHF, chronic  Acute respiratory failure with hypoxia  Ct Chest W Contrast  10/28/2011  *RADIOLOGY REPORT*  Clinical Data: Shortness of breath and cough.  CT CHEST WITH CONTRAST  Technique:  Multidetector CT imaging of the chest was performed following the standard protocol during bolus administration of intravenous contrast.  Contrast: 80mL OMNIPAQUE IOHEXOL 300 MG/ML  SOLN  Comparison: CT chest 05/23/2008.  One-view portable chest earlier today.  Findings: Extensive consolidation in the right upper lobe without visible proximal obstructing mass or hilar/mediastinal adenopathy. Possible pulmonary abscess laterally with intrapulmonary collection of fluid and air versus fluid-filled large emphysematous bleb (image 23, 16 x 29 mm).  Faint airspace opacities right lower lobe and right middle lobe.  Small to moderate right pleural effusion. Generalized COPD changes.  Normal heart size.  Three-vessel coronary artery calcification.  No pericardial effusion.  No acute  osseous findings.  Negative  upper abdominal structures.  Trachea midline.  No neck masses.  IMPRESSION: Findings consistent with extensive right lung pneumonia, primarily right upper lobe. Possible developing right upper lobe pulmonary abscess.  Small to moderate right pleural effusion.  No definite proximal obstructing lesion.  Follow-up radiographs should be obtained until the infiltrate clears.  If concern persists for neoplasm, PET scan recommended.   Original Report Authenticated By: Elsie Stain, M.D.    Dg Chest Port 1 View  10/30/2011  *RADIOLOGY REPORT*  Clinical Data: Pneumonia.  PORTABLE CHEST - 1 VIEW  Comparison: Chest 10/29/2011 and 10/28/2011.  Findings: Dense consolidation in the right upper lobe shows some improvement over the past 2 days.  More patchy airspace opacity in the right mid and lower lung zones appears unchanged since yesterday's study.  Streaky left basilar opacity has improved. Heart size is normal.  Lungs are severely emphysematous.  No pneumothorax.  Very small right pleural effusion is noted.  IMPRESSION: Improved appearance of right side pneumonia.  Aeration in the left lung base also has improved.   Original Report Authenticated By: Bernadene Bell. Maricela Curet, M.D.    Dg Chest Port 1 View  10/29/2011  *RADIOLOGY REPORT*  Clinical Data: Worsening shortness of breath.  PORTABLE CHEST - 1 VIEW  Comparison: CT and plain film chest 10/28/2011.  Findings: Lungs are severely emphysematous.  Right upper lobe airspace disease persists with worsening aeration in the mid and lower lung zones bilaterally particularly on the right.  Heart size is normal.  IMPRESSION:  1.  No marked change in right upper lobe pneumonia. 2.  New airspace opacities in the mid and lower lung zones, greater on the right, could be due to progressive pneumonia and/or edema. 3.  Emphysema.   Original Report Authenticated By: Bernadene Bell. D'ALESSIO, M.D.    Dg Chest Portable 1 View  10/28/2011  *RADIOLOGY REPORT*  Clinical Data: Weakness and  weight loss  PORTABLE CHEST - 1 VIEW  Comparison: Chest radiograph 09/04/2010,, CT 05/23/2008  Findings: There is a new irregular mass lesion in the right upper lobe measuring 7 cm x 7 cm.  Differential for this mass would be neoplasm or infection.  The cardiac silhouette is normal.  Lungs are hyperinflated.  There is bullous change in the apices.  IMPRESSION:  1.  Right upper lobe pneumonia versus carcinoma.  Recommend CT thorax with contrast. 2.  Emphysematous change.   Original Report Authenticated By: Genevive Bi, M.D.     Lab 10/30/11 0320 10/29/11 1436 10/29/11 0320  NA 120* 122* 123*  K 2.7* 2.7* 2.8*  CL 85* 87* 89*  CO2 24 24 21   BUN <3* <3* <3*  CREATININE 0.22* 0.30* 0.26*  GLUCOSE 177* 180* 110*    Lab 10/30/11 0320 10/29/11 0320 10/28/11 1340  HGB 10.6* 12.4 12.1  HCT 28.2* 34.2* 33.2*  WBC 10.1 16.2* 12.7*  PLT 408* 354 419*   BNP (last 3 results)  Basename 10/28/11 2052  PROBNP 133.2*   ABG    Component Value Date/Time   PHART 7.541* 06/06/2007 1735   PCO2ART 30.9* 06/06/2007 1735   PO2ART 65.5* 06/06/2007 1735   HCO3 26.4* 06/06/2007 1735   TCO2 23.1 06/06/2007 1735   ACIDBASEDEF 0.8 05/28/2007 0830   O2SAT 93.7 06/06/2007 1735   Micro: 9/18 bc x 2>> ng 9/18 sputum>> Urine strep ag neg  ABX: 9/18 roc>> 9/18 zithro>> 9/18 avelox>>9/18 9/19 vanc >>  ASSESSMENT AND PLAN Acute respiratory failure in setting of  right upper lobe dense pna, bullous emphysema, tobacco and crack cocaine abuse - CAP vs aspiration -improved Severe COPD -Abx as per flow, add vanc until MRSA ruled out on rpt sputum cx -supplemental O2 as needed -pulmonary toilet, albuterol/ atrovent nebs q 6h , no bspasm - dc steroids -Doubt need for FOB here, Note nodules were stable from 4/09  & no increase in lt apical scar on current CT -she declines HIV testing , neg '09 -Rpt CXR on Monday, will need FU to resolution - may take a few weeks  Hyponatremia/Hypokalemia/ hypomagnesemia  Lab  10/30/11 0320 10/29/11 1436 10/29/11 0320  NA 120* 122* 123*    Lab 10/30/11 0320 10/29/11 1436 10/29/11 0320  K 2.7* 2.7* 2.8*     -replete aggressively - 4 g mg total & K as needed, rechk today  CHF -2D echo on May 27, 2007, which showed severe hypokinesis with a ventricular ejection rate of 30%. A repeat 2-D echo on June 08, 2007, showed ejection fraction of 55% with a mild AVR and with IVC being flat. Suspected TAKA-SUBO by Dr. Teressa Lower  -judicious use of IVF   HTN  - hold antihypertensive  PCCM will see again on Monday, pl call over weekend if needed OK to transfer to tele, dc vanc in 24 h if sputum cx neg  10/30/2011, 8:23 AM  Arlen Dupuis V.  [phone]

## 2011-10-30 NOTE — Progress Notes (Signed)
CRITICAL VALUE ALERT  Critical value received:  Potassium 2.7  Date of notification:  10/30/11  Time of notification: 0415  Critical value read back:yes  Nurse who received alert:  Harrietta Guardian RN  MD notified (1st page): L.  Harduck  Time of first page:  0416  MD notified (2nd page):  Time of second page:  Responding MD:  Elwanda Brooklyn  Time MD responded:  [phone]

## 2011-10-30 NOTE — Progress Notes (Addendum)
[facility] PROGRESS NOTE  Jennifer Hurley ZOX:[id number] DOB: [birth year] DOA: 10/28/2011 PCP: No primary provider on file.  Brief narrative: 56 Year old female with history of crack cocaine abuse, smoker, presented to ED with complaints of worsening shortness of breath, productive cough and unintentional weight loss. Patient found to have right sided pneumonia.   Assessment and Plan:   Principal Problem:  *CAP (community acquired pneumonia)  - ct chest with stable lung nodule and right sided pneumonia  - continue antibiotics, azithromycin, rocephin and vanco  - patient continues to spike fever even with the above antibiotics - patient declined HIV testing - follow up blood and respiratory culture results  - added solumedrol 80 mg Q 8 hours IV  - appreciate pulmonary following   Active Problems:  Acute exacerbation of COPD  - nebulizer treatments scheduled and prn  - oxygen support via nasal canula to keep O2 saturation above 90%  - continue current antibiotics as above - continue solumedrol 80 mg Q 8 hours IV  - pulmonary toilet   Leukocytosis  - secondary to sepsis due to community acquired pneumonia  - procalcitonin level 0.21 - follow up blood culture results  - collect sputum culture again, 1st sample not good sample - continue azithromycin and ceftriaxone  - vanco added to azithromycin and ceftriaxone but patient continues to spike fever in past 24 hours, Tmax 102 F  Hyponatremia  - unclear etiology although potential SIADH  - added demeclocycline - TSH is within normal limits - follow up urine electrolytes, urine and serum osm  Hypokalemia  - replete aggresively - continue to follow BMP  Systolic CHF, chronic  - based on 2 D ECHO in 2009, EF of ~55%  - BNP only mildly elevated  - given 1 dose of lasix   Code Status: Full  Family Communication: family not present at bedside  Disposition Plan: pending d/c at this time, will need PT evaluation  Manson Passey, MD  [facility]  Pager [phone]  If 7PM-7AM, please contact night-coverage [email or web address] Password [email or web address] 10/30/2011, 6:57 AM   LOS: 2 days   Consultants:  Pulmonary  Procedures:  None   Antibiotics:  Rocephin 9/18 -->  Azithromycin 9/18 ->  Vanco 9/19 -->  HPI/Subjective: Still congested, reports she feels better however.  Objective: Filed Vitals:   10/29/11 2300 10/30/11 0230 10/30/11 0300 10/30/11 0630  BP: 92/59  90/69 100/73  Pulse: 96  107 101  Temp: 97.2 F (36.2 C)  96.6 F (35.9 C) 97.3 F (36.3 C)  TempSrc: Oral     Resp: 16  14 19   Height:      Weight:      SpO2: 98% 98% 98% 98%    Intake/Output Summary (Last 24 hours) at 10/30/11 0657 Last data filed at 10/30/11 0500  Gross per 24 hour  Intake 6074.83 ml  Output   1025 ml  Net 5049.83 ml    Exam:   General:  Pt is alert, follows commands appropriately, not in acute distress  Cardiovascular: Regular rate and rhythm, S1/S2, no murmurs, no rubs, no gallops  Respiratory: rhonchi still appreciated upper and mid lung lobes  Abdomen: Soft, non tender, non distended, bowel sounds present, no guarding  Extremities: No edema, pulses DP and PT palpable bilaterally  Neuro: Grossly nonfocal  Data Reviewed: Basic Metabolic Panel:  Lab 10/30/11 8295 10/29/11 1436 10/29/11 0320 10/28/11 2046 10/28/11 1340  NA 120* 122* 123* -- 119*  K 2.7* 2.7* 2.8* -- 3.3*  CL 85* 87* 89* -- 75*  CO2 24 24 21  -- 26  GLUCOSE 177* 180* 110* -- 100*  BUN <3* <3* <3* -- <3*  CREATININE 0.22* 0.30* 0.26* -- 0.30*  CALCIUM 7.3* 7.4* 7.5* -- 8.8  MG 1.1* -- -- 1.7 --  PHOS 1.8* -- -- 3.4 --   Liver Function Tests:  Lab 10/29/11 0320 10/28/11 1340  AST 61* 24  ALT 17 7  ALKPHOS 90 112  BILITOT 0.4 0.4  PROT 6.3 6.7  ALBUMIN 2.1* 2.4*   CBC:  Lab 10/30/11 0320 10/29/11 0320 10/28/11 1340  WBC 10.1 16.2* 12.7*  HGB 10.6* 12.4 12.1  HCT 28.2* 34.2* 33.2*  MCV 76.8* 77.2* 76.3*  PLT 408* 354 419*    CBG:  Lab 10/29/11 0751  GLUCAP 84    Recent Results (from the past 240 hour(s))  URINE CULTURE     Status: Normal   Collection Time   10/28/11  1:47 PM      Component Value Range Status Comment   Specimen Description URINE, CATHETERIZED   Final    Special Requests NONE   Final    Culture  Setup Time 10/29/2011 01:32   Final    Colony Count NO GROWTH   Final    Culture NO GROWTH   Final    Report Status 10/30/2011 FINAL   Final   MRSA PCR SCREENING     Status: Normal   Collection Time   10/28/11  6:33 PM      Component Value Range Status Comment   MRSA by PCR NEGATIVE  NEGATIVE Final   CULTURE, EXPECTORATED SPUTUM-ASSESSMENT     Status: Normal   Collection Time   10/28/11  8:02 PM      Component Value Range Status Comment   Specimen Description SPUTUM   Final    Special Requests Normal   Final    Sputum evaluation     Final    Value: MICROSCOPIC FINDINGS SUGGEST THAT THIS SPECIMEN IS NOT REPRESENTATIVE OF LOWER RESPIRATORY SECRETIONS. PLEASE RECOLLECT.     Gram Stain Report Called to,Read Back By and Verified With: S [facility] RN 2116 10/28/11 A NAVARRO   Report Status 10/28/2011 FINAL   Final      Studies: Ct Chest W Contrast  10/28/2011  *RADIOLOGY REPORT*  Clinical Data: Shortness of breath and cough.  CT CHEST WITH CONTRAST  Technique:  Multidetector CT imaging of the chest was performed following the standard protocol during bolus administration of intravenous contrast.  Contrast: 80mL OMNIPAQUE IOHEXOL 300 MG/ML  SOLN  Comparison: CT chest 05/23/2008.  One-view portable chest earlier today.  Findings: Extensive consolidation in the right upper lobe without visible proximal obstructing mass or hilar/mediastinal adenopathy. Possible pulmonary abscess laterally with intrapulmonary collection of fluid and air versus fluid-filled large emphysematous bleb (image 23, 16 x 29 mm).  Faint airspace opacities right lower lobe and right middle lobe.  Small to moderate right pleural  effusion. Generalized COPD changes.  Normal heart size.  Three-vessel coronary artery calcification.  No pericardial effusion.  No acute osseous findings.  Negative upper abdominal structures.  Trachea midline.  No neck masses.  IMPRESSION: Findings consistent with extensive right lung pneumonia, primarily right upper lobe. Possible developing right upper lobe pulmonary abscess.  Small to moderate right pleural effusion.  No definite proximal obstructing lesion.  Follow-up radiographs should be obtained until the infiltrate clears.  If concern persists for neoplasm, PET scan recommended.   Original Report Authenticated By:  Elsie Stain, M.D.    Dg Chest Port 1 View  10/30/2011  *RADIOLOGY REPORT*  Clinical Data: Pneumonia.  PORTABLE CHEST - 1 VIEW  Comparison: Chest 10/29/2011 and 10/28/2011.  Findings: Dense consolidation in the right upper lobe shows some improvement over the past 2 days.  More patchy airspace opacity in the right mid and lower lung zones appears unchanged since yesterday's study.  Streaky left basilar opacity has improved. Heart size is normal.  Lungs are severely emphysematous.  No pneumothorax.  Very small right pleural effusion is noted.  IMPRESSION: Improved appearance of right side pneumonia.  Aeration in the left lung base also has improved.   Original Report Authenticated By: Bernadene Bell. Maricela Curet, M.D.    Dg Chest Port 1 View  10/29/2011  *RADIOLOGY REPORT*  Clinical Data: Worsening shortness of breath.  PORTABLE CHEST - 1 VIEW  Comparison: CT and plain film chest 10/28/2011.  Findings: Lungs are severely emphysematous.  Right upper lobe airspace disease persists with worsening aeration in the mid and lower lung zones bilaterally particularly on the right.  Heart size is normal.  IMPRESSION:  1.  No marked change in right upper lobe pneumonia. 2.  New airspace opacities in the mid and lower lung zones, greater on the right, could be due to progressive pneumonia and/or edema. 3.   Emphysema.   Original Report Authenticated By: Bernadene Bell. D'ALESSIO, M.D.    Dg Chest Portable 1 View  10/28/2011  *RADIOLOGY REPORT*  Clinical Data: Weakness and weight loss  PORTABLE CHEST - 1 VIEW  Comparison: Chest radiograph 09/04/2010,, CT 05/23/2008  Findings: There is a new irregular mass lesion in the right upper lobe measuring 7 cm x 7 cm.  Differential for this mass would be neoplasm or infection.  The cardiac silhouette is normal.  Lungs are hyperinflated.  There is bullous change in the apices.  IMPRESSION:  1.  Right upper lobe pneumonia versus carcinoma.  Recommend CT thorax with contrast. 2.  Emphysematous change.   Original Report Authenticated By: Genevive Bi, M.D.     Scheduled Meds:   . albuterol  2.5 mg Nebulization Q6H  . azithromycin  500 mg Intravenous Q24H  . cefTRIAXone (ROCEPHIN)  IV  1 g Intravenous Q24H  . feeding supplement  1 Container Oral TID BM  . furosemide  40 mg Intravenous Once  . ipratropium      . ipratropium  0.5 mg Nebulization Q6H  . magnesium sulfate 1 - 4 g bolus IVPB  2 g Intravenous Once  . methylPREDNISolone (SOLU-MEDROL) injection  80 mg Intravenous Q8H  . potassium chloride  10 mEq Intravenous Q1 Hr x 3  . vancomycin  750 mg Intravenous Q24H

## 2011-10-31 LAB — BASIC METABOLIC PANEL
Calcium: 8 mg/dL — ABNORMAL LOW (ref 8.4–10.5)
GFR calc Af Amer: >90 mL/min (ref >90)
GFR calc non Af Amer: >90 mL/min (ref >90)
Glucose, Bld: 124 mg/dL — ABNORMAL HIGH (ref 70–99)
Sodium: 124 mEq/L — ABNORMAL LOW (ref 135–145)

## 2011-10-31 LAB — CBC
Hemoglobin: 9.5 g/dL — ABNORMAL LOW (ref 12.0–15.0)
MCH: 27.8 pg (ref 26.0–34.0)
MCHC: 36.1 g/dL — ABNORMAL HIGH (ref 30.0–36.0)
RDW: 12.6 % (ref 11.5–15.5)

## 2011-10-31 NOTE — Progress Notes (Signed)
Patients heart rate in the 150's during a coughing episode.  Giving patient next PRN dose of robitussin when available.

## 2011-10-31 NOTE — Progress Notes (Signed)
Patient appears anxious.  Complaining of a cough that scares her.  Placed on 1L [place] to provide comfort.  Complaining of being sore inside mouth and continuing to request something for congestion in her ears.

## 2011-10-31 NOTE — Progress Notes (Signed)
[facility] PROGRESS NOTE  Jennifer Hurley YNW:[id number] DOB: 12-25-1955 DOA: 10/28/2011 PCP: No primary provider on file.  Brief narrative: 56 Year old female with history of crack cocaine abuse, smoker, presented to ED with complaints of worsening shortness of breath, productive cough and unintentional weight loss. Patient found to have right sided pneumonia. Repeat CXR 9/20 shows improvement in aeration.  Assessment and Plan:   Principal Problem:  *CAP (community acquired pneumonia)  - CT chest with stable lung nodule and right sided pneumonia  - continue antibiotics, azithromycin, rocephin and vanco   - patient declined HIV testing  - follow up blood cultures are all negative to date - solumedrol discontinued - appreciate pulmonary following   Active Problems:  Acute exacerbation of COPD  - nebulizer treatments scheduled and prn  - oxygen support via nasal canula to keep O2 saturation above 90%  - continue current antibiotics as above  - solumedrol discontinued  - pulmonary toilet   Leukocytosis  - secondary to sepsis due to community acquired pneumonia  - procalcitonin level 0.21  - follow up blood culture results - negative to date - follow up respiratory culture results - continue azithromycin and ceftriaxone  - vanco added to azithromycin and ceftriaxone    Hyponatremia  - unclear etiology although potential SIADH  - added demeclocycline  - TSH is within normal limits  - follow up urine electrolytes, urine and serum osm - sodium trending up  Hypokalemia  - WNL today - continue to follow BMP   Systolic CHF, chronic  - based on 2 D ECHO in 2009, EF of ~55%  - BNP only mildly elevated  - given only 1 dose of lasix on admission  Code Status: Full  Family Communication: family not present at bedside  Disposition Plan: home when stable  Manson Passey, MD  [facility]  Pager [phone]  If 7PM-7AM, please contact night-coverage [email or web address] Password  [facility] 10/31/2011, 6:34 AM   LOS: 3 days   HPI/Subjective: No acute events overnight.  Objective: Filed Vitals:   10/30/11 1422 10/30/11 1937 10/30/11 2142 10/31/11 0148  BP:   116/94   Pulse:   95   Temp:   97.4 F (36.3 C)   TempSrc:   Oral   Resp:   16   Height:      Weight:      SpO2: 97% 94% 99% 95%    Intake/Output Summary (Last 24 hours) at 10/31/11 0634 Last data filed at 10/30/11 2300  Gross per 24 hour  Intake   1110 ml  Output    900 ml  Net    210 ml    Exam:   General:  Pt is alert, follows commands appropriately, not in acute distress  Cardiovascular: Regular rate and rhythm, S1/S2, no murmurs, no rubs, no gallops  Respiratory: rhonchi still appreciated although improving  Abdomen: Soft, non tender, non distended, bowel sounds present, no guarding  Extremities: No edema, pulses DP and PT palpable bilaterally  Neuro: Grossly nonfocal  Data Reviewed: Basic Metabolic Panel:  Lab 10/31/11 6295 10/30/11 1451 10/30/11 0320 10/29/11 1436 10/29/11 0320 10/28/11 2046  NA 124* 122* 120* 122* 123* --  K 3.9 3.8 2.7* 2.7* 2.8* --  CL 90* 88* 85* 87* 89* --  CO2 24 22 24 24 21  --  GLUCOSE 124* 193* 177* 180* 110* --  BUN <3* <3* <3* <3* <3* --  CREATININE 0.27* 0.26* 0.22* 0.30* 0.26* --  CALCIUM 8.0* 7.7* 7.3* 7.4* 7.5* --  MG 2.2 -- 1.1* -- -- 1.7  PHOS -- -- 1.8* -- -- 3.4   Liver Function Tests:  Lab 10/29/11 0320 10/28/11 1340  AST 61* 24  ALT 17 7  ALKPHOS 90 112  BILITOT 0.4 0.4  PROT 6.3 6.7  ALBUMIN 2.1* 2.4*   CBC:  Lab 10/31/11 0534 10/30/11 0320 10/29/11 0320 10/28/11 1340  WBC 7.9 10.1 16.2* 12.7*  HGB 9.5* 10.6* 12.4 12.1  HCT 26.3* 28.2* 34.2* 33.2*  MCV 76.9* 76.8* 77.2* 76.3*  PLT 455* 408* 354 419*   CBG:  Lab 10/30/11 0742 10/29/11 0751  GLUCAP 173* 84    CULTURE, BLOOD (ROUTINE X 2)     Status: Normal (Preliminary result)   Collection Time   10/28/11  1:00 PM      Component Value Range Status Comment   Culture      Final    Value:        BLOOD CULTURE RECEIVED NO GROWTH TO DATE    Report Status PENDING   Incomplete   CULTURE, BLOOD (ROUTINE X 2)     Status: Normal (Preliminary result)   Collection Time   10/28/11  1:20 PM      Component Value Range Status Comment   Culture     Final    Value:        BLOOD CULTURE RECEIVED NO GROWTH TO DATE    Report Status PENDING   Incomplete   URINE CULTURE     Status: Normal   Collection Time   10/28/11  1:47 PM      Component Value Range Status Comment   Specimen Description URINE  Final    Culture NO GROWTH   Final    Report Status 10/30/2011 FINAL   Final   MRSA PCR SCREENING     Status: Normal   Collection Time   10/28/11  6:33 PM      Component Value Range Status Comment   MRSA by PCR NEGATIVE  NEGATIVE Final   CULTURE, EXPECTORATED SPUTUM-ASSESSMENT     Status: Normal   Collection Time   10/28/11  8:02 PM      Component Value Range Status Comment   Specimen Description SPUTUM   Final    Special Requests Normal   Final    Sputum evaluation     Final    Value: MICROSCOPIC FINDINGS SUGGEST THAT THIS SPECIMEN IS NOT REPRESENTATIVE OF LOWER RESPIRATORY SECRETIONS. PLEASE RECOLLECT.     Gram Stain Report Called to,Read Back By and Verified With: S [facility] RN 2116 10/28/11 A NAVARRO   Report Status 10/28/2011 FINAL   Final      Studies: Dg Chest Port 1 View 10/30/2011  * IMPRESSION: Improved appearance of right side pneumonia.  Aeration in the left lung base also has improved.      Scheduled Meds:   . albuterol  2.5 mg Nebulization Q6H  . azithromycin  500 mg Intravenous Q24H  . cefTRIAXone   1 g Intravenous Q24H  . demeclocycline  150 mg Oral Q12H  . feeding supplement  1 Container Oral TID BM  . ipratropium  0.5 mg Nebulization Q6H  . magnesium   2 g Intravenous Once  . potassium chloride  40 mEq Oral Once  . vancomycin  750 mg Intravenous Q24H

## 2011-10-31 NOTE — Progress Notes (Signed)
Patient complaining of itching around the time she receives her IV antibiotics.

## 2011-10-31 NOTE — Progress Notes (Signed)
Clinical Social Work Department BRIEF PSYCHOSOCIAL ASSESSMENT 10/31/2011  Patient:  Jennifer Hurley, Jennifer Hurley     Account Number:  [id number]     Admit date:  10/28/2011  Clinical Social Worker:  Leron Croak, CLINICAL SOCIAL WORKER  Date/Time:  10/31/2011 12:00 M  Referred by:  Physician  Date Referred:  10/31/2011 Referred for  Substance Abuse   Other Referral:   Interview type:  Patient Other interview type:    PSYCHOSOCIAL DATA Living Status:  ALONE Admitted from facility:   Level of care:   Primary support name:  [name] Primary support relationship to patient:  CHILD, ADULT Degree of support available:   good    CURRENT CONCERNS Current Concerns  Substance Abuse   Other Concerns:    SOCIAL WORK ASSESSMENT / PLAN CSW met with the Pt at the bedside. Pt was offended when CSW explained the nature of the consult. Pt stated that she has been "clean for awhile" and when asked how long was "awhile" she stated 6 months. Pt is in a program in [place] called [facility]. Pt is agreeable to continued Tx and to receiving additional information.   Assessment/plan status:  Information/Referral to [facility] Other assessment/ plan:   Information/referral to community resources:   CSW provided the Pt information on Substance Abuse programs in [place].    PATIENT'S/FAMILY'S RESPONSE TO PLAN OF CARE: Pt was appreciative and stated she will consider some of the other programs.        Leron Croak, LCSWA [employer] [phone]

## 2011-10-31 NOTE — Progress Notes (Signed)
Patient complaining of a headache.  Administered tylenol.  Complaining of congestion in ears and bumps inside her mouth.  States she noticed this am.  Paged Md.

## 2011-10-31 NOTE — Progress Notes (Signed)
Complaining of congestion in both ears.

## 2011-10-31 NOTE — Progress Notes (Signed)
Complaining of aching pain the right anterior forearm.  Medicated.

## 2011-10-31 NOTE — Progress Notes (Signed)
Complaining of left side pain.  Gave a pain pill.

## 2011-11-01 ENCOUNTER — Inpatient Hospital Stay (HOSPITAL_COMMUNITY): Payer: Medicare Other

## 2011-11-01 DIAGNOSIS — D72829 Elevated white blood cell count, unspecified: Secondary | ICD-10-CM

## 2011-11-01 DIAGNOSIS — B370 Candidal stomatitis: Secondary | ICD-10-CM

## 2011-11-01 DIAGNOSIS — J9600 Acute respiratory failure, unspecified whether with hypoxia or hypercapnia: Secondary | ICD-10-CM

## 2011-11-01 LAB — CBC
MCH: 28.2 pg (ref 26.0–34.0)
MCHC: 36.3 g/dL — ABNORMAL HIGH (ref 30.0–36.0)
Platelets: 416 10*3/uL — ABNORMAL HIGH (ref 150–400)
RBC: 3.47 MIL/uL — ABNORMAL LOW (ref 3.87–5.11)

## 2011-11-01 LAB — BASIC METABOLIC PANEL
BUN: 3 mg/dL — ABNORMAL LOW (ref 6–23)
Calcium: 8.4 mg/dL (ref 8.4–10.5)
GFR calc non Af Amer: >90 mL/min (ref >90)
Glucose, Bld: 69 mg/dL — ABNORMAL LOW (ref 70–99)
Sodium: 127 mEq/L — ABNORMAL LOW (ref 135–145)

## 2011-11-01 LAB — GLUCOSE, CAPILLARY: Glucose-Capillary: 53 mg/dL — ABNORMAL LOW (ref 70–99)

## 2011-11-01 NOTE — Progress Notes (Addendum)
Hypoglycemic Event  CBG: 56  Treatment: 15 GM carbohydrate snack  Symptoms: None  Follow-up CBG: Time: 0845 CBG Result: 79  Possible Reasons for Event: Inadequate meal intake  Comments/MD notified: 20    Shiquan Mathieu M  Remember to initiate Hypoglycemia Order Set & complete

## 2011-11-01 NOTE — Progress Notes (Signed)
[facility] PROGRESS NOTE  Jennifer Hurley ZOX:[id number] DOB: 16-Jun-1955 DOA: 10/28/2011 PCP: No primary provider on file.  Brief narrative: 55 Year old female with history of crack cocaine abuse, smoker, presented to ED with complaints of worsening shortness of breath, productive cough and unintentional weight loss. Patient found to have right sided pneumonia. Repeat CXR 9/20 shows improvement in aeration.   Assessment and Plan:   Principal Problem:  *CAP (community acquired pneumonia)  - CT chest with stable lung nodule and right sided pneumonia  - continue antibiotics, azithromycin, rocephin  - d/c vancomycin - patient has declined HIV testing  - follow up blood cultures are all negative to date  - solumedrol discontinued  - appreciate pulmonary following   Active Problems:  Acute exacerbation of COPD  - nebulizer treatments scheduled and prn  - oxygen support via nasal canula to keep O2 saturation above 90%  - continue current antibiotics - solumedrol discontinued  - pulmonary toilet   Leukocytosis  - secondary to sepsis due to community acquired pneumonia  - procalcitonin level 0.21  - follow up blood culture results - negative to date  - follow up respiratory culture results - not good sample and second sample not yet collected - continue azithromycin and ceftriaxone  - d/c vancomycin  Hyponatremia  - possible SIADH  - continue demeclocycline  - TSH is within normal limits  - follow up urine electrolytes within normal limits  - sodium trending up  Hypokalemia  - WNL   Systolic CHF, chronic  - based on 2 D ECHO in 2009, EF of ~55%  - BNP only mildly elevated  - given only 1 dose of lasix on admission   Code Status: Full  Family Communication: family not present at bedside  Disposition Plan: PT recommended no follow up required; home in next 24 hours  Manson Passey, MD  [facility]  Pager [phone]   If 7PM-7AM, please contact  night-coverage [email or web address] Password [facility] 11/01/2011, 8:31 AM   LOS: 4 days   HPI/Subjective: No acute events overnight.  Objective: Filed Vitals:   10/31/11 2128 11/01/11 0212 11/01/11 0521 11/01/11 0823  BP: 95/71  121/85   Pulse: 109 103 106   Temp: 97.6 F (36.4 C)  97.6 F (36.4 C)   TempSrc: Oral  Oral   Resp: 19 18 18    Height:      Weight:   38.737 kg (85 lb 6.4 oz)   SpO2: 97% 94% 95% 93%    Intake/Output Summary (Last 24 hours) at 11/01/11 0831 Last data filed at 11/01/11 0643  Gross per 24 hour  Intake    860 ml  Output   1600 ml  Net   -740 ml    Exam:   General:  Pt is alert, follows commands appropriately, not in acute distress  Cardiovascular: Regular rate and rhythm, S1/S2, no murmurs, no rubs, no gallops  Respiratory: rhonchi appreciated  Abdomen: Soft, non tender, non distended, bowel sounds present, no guarding  Extremities: No edema, pulses DP and PT palpable bilaterally  Neuro: Grossly nonfocal  Data Reviewed: Basic Metabolic Panel:  Lab 11/01/11 8295 10/31/11 0534 10/30/11 1451 10/30/11 0320 10/29/11 1436 10/28/11 2046  NA 127* 124* 122* 120* 122* --  K 3.6 3.9 3.8 2.7* 2.7* --  CL 92* 90* 88* 85* 87* --  CO2 25 24 22 24 24  --  GLUCOSE 69* 124* 193* 177* 180* --  BUN 3* <3* <3* <3* <3* --  CREATININE 0.38* 0.27* 0.26*  0.22* 0.30* --  CALCIUM 8.4 8.0* 7.7* 7.3* 7.4* --   Liver Function Tests:  Lab 10/29/11 0320 10/28/11 1340  AST 61* 24  ALT 17 7  ALKPHOS 90 112  BILITOT 0.4 0.4  PROT 6.3 6.7  ALBUMIN 2.1* 2.4*   CBC:  Lab 11/01/11 0418 10/31/11 0534 10/30/11 0320 10/29/11 0320 10/28/11 1340  WBC 13.3* 7.9 10.1 16.2* 12.7*  HGB 9.8* 9.5* 10.6* 12.4 12.1  HCT 27.0* 26.3* 28.2* 34.2* 33.2*  MCV 77.8* 76.9* 76.8* 77.2* 76.3*  PLT 416* 455* 408* 354 419*   CBG:  Lab 11/01/11 0740 10/30/11 0742 10/29/11 0751  GLUCAP 53* 173* 84    CULTURE, BLOOD (ROUTINE X 2)     Status: Normal (Preliminary result)   Collection  Time   10/28/11  1:00 PM      Component Value Range Status Comment   Culture     Final    Value:        BLOOD CULTURE RECEIVED NO GROWTH TO DATE    Report Status PENDING   Incomplete   CULTURE, BLOOD (ROUTINE X 2)     Status: Normal (Preliminary result)   Collection Time   10/28/11  1:20 PM      Component Value Range Status Comment   Culture     Final    Value:        BLOOD CULTURE RECEIVED NO GROWTH TO DATE    Report Status PENDING   Incomplete   URINE CULTURE     Status: Normal   Collection Time   10/28/11  1:47 PM      Component Value Range Status Comment   Specimen Description URINE  Final    Culture NO GROWTH   Final    Report Status 10/30/2011 FINAL   Final   MRSA PCR SCREENING     Status: Normal   Collection Time   10/28/11  6:33 PM      Component Value Range Status Comment   MRSA by PCR NEGATIVE  NEGATIVE Final   CULTURE, EXPECTORATED SPUTUM-ASSESSMENT     Status: Normal   Collection Time   10/28/11  8:02 PM      Component Value Range Status Comment   Specimen Description SPUTUM   Final    Special Requests Normal   Final    Sputum evaluation     Final    Value: MICROSCOPIC FINDINGS SUGGEST THAT THIS SPECIMEN IS NOT REPRESENTATIVE OF LOWER RESPIRATORY SECRETIONS. PLEASE RECOLLECT.     Gram Stain Report Called to,Read Back By and Verified With: [facility] RN 2116 10/28/11 A NAVARRO   Report Status 10/28/2011 FINAL   Final      Studies: No results found.  Scheduled Meds:   . albuterol  2.5 mg Nebulization Q6H  . azithromycin  500 mg Intravenous Q24H  . cefTRIAXone (ROCEPHIN)  IV  1 g Intravenous Q24H  . demeclocycline  150 mg Oral Q12H  . feeding supplement  1 Container Oral TID BM  . ipratropium  0.5 mg Nebulization Q6H  . potassium chloride  40 mEq Oral Once

## 2011-11-02 ENCOUNTER — Inpatient Hospital Stay (HOSPITAL_COMMUNITY)
Admission: EM | Admit: 2011-11-02 | Discharge: 2011-11-13 | DRG: 640 | Disposition: A | Discharge status: Home Health | Payer: Medicare Other | Attending: Family Medicine | Admitting: Family Medicine

## 2011-11-02 ENCOUNTER — Encounter (HOSPITAL_COMMUNITY): Payer: Self-pay | Admitting: Emergency Medicine

## 2011-11-02 DIAGNOSIS — F17200 Nicotine dependence, unspecified, uncomplicated: Secondary | ICD-10-CM | POA: Diagnosis present

## 2011-11-02 DIAGNOSIS — Z9181 History of falling: Secondary | ICD-10-CM

## 2011-11-02 DIAGNOSIS — E876 Hypokalemia: Secondary | ICD-10-CM | POA: Diagnosis present

## 2011-11-02 DIAGNOSIS — I10 Essential (primary) hypertension: Secondary | ICD-10-CM | POA: Diagnosis present

## 2011-11-02 DIAGNOSIS — J449 Chronic obstructive pulmonary disease, unspecified: Secondary | ICD-10-CM

## 2011-11-02 DIAGNOSIS — R531 Weakness: Secondary | ICD-10-CM

## 2011-11-02 DIAGNOSIS — J189 Pneumonia, unspecified organism: Principal | ICD-10-CM

## 2011-11-02 DIAGNOSIS — E43 Unspecified severe protein-calorie malnutrition: Secondary | ICD-10-CM | POA: Diagnosis present

## 2011-11-02 DIAGNOSIS — Z91199 Patient's noncompliance with other medical treatment and regimen due to unspecified reason: Secondary | ICD-10-CM

## 2011-11-02 DIAGNOSIS — Y92009 Unspecified place in unspecified non-institutional (private) residence as the place of occurrence of the external cause: Secondary | ICD-10-CM

## 2011-11-02 DIAGNOSIS — I252 Old myocardial infarction: Secondary | ICD-10-CM

## 2011-11-02 DIAGNOSIS — E8779 Other fluid overload: Secondary | ICD-10-CM | POA: Diagnosis present

## 2011-11-02 DIAGNOSIS — F1210 Cannabis abuse, uncomplicated: Secondary | ICD-10-CM | POA: Diagnosis present

## 2011-11-02 DIAGNOSIS — M069 Rheumatoid arthritis, unspecified: Secondary | ICD-10-CM | POA: Diagnosis present

## 2011-11-02 DIAGNOSIS — R5381 Other malaise: Secondary | ICD-10-CM | POA: Diagnosis present

## 2011-11-02 DIAGNOSIS — F1910 Other psychoactive substance abuse, uncomplicated: Secondary | ICD-10-CM

## 2011-11-02 DIAGNOSIS — R5383 Other fatigue: Secondary | ICD-10-CM | POA: Diagnosis present

## 2011-11-02 DIAGNOSIS — D649 Anemia, unspecified: Secondary | ICD-10-CM | POA: Diagnosis present

## 2011-11-02 DIAGNOSIS — Z681 Body mass index (BMI) 19 or less, adult: Secondary | ICD-10-CM

## 2011-11-02 DIAGNOSIS — I2510 Atherosclerotic heart disease of native coronary artery without angina pectoris: Secondary | ICD-10-CM | POA: Diagnosis present

## 2011-11-02 DIAGNOSIS — R627 Adult failure to thrive: Secondary | ICD-10-CM

## 2011-11-02 DIAGNOSIS — Z79899 Other long term (current) drug therapy: Secondary | ICD-10-CM

## 2011-11-02 DIAGNOSIS — F1410 Cocaine abuse, uncomplicated: Secondary | ICD-10-CM | POA: Diagnosis present

## 2011-11-02 DIAGNOSIS — D72829 Elevated white blood cell count, unspecified: Secondary | ICD-10-CM

## 2011-11-02 DIAGNOSIS — Z8249 Family history of ischemic heart disease and other diseases of the circulatory system: Secondary | ICD-10-CM

## 2011-11-02 DIAGNOSIS — Z9119 Patient's noncompliance with other medical treatment and regimen: Secondary | ICD-10-CM

## 2011-11-02 DIAGNOSIS — Z9089 Acquired absence of other organs: Secondary | ICD-10-CM

## 2011-11-02 DIAGNOSIS — E871 Hypo-osmolality and hyponatremia: Secondary | ICD-10-CM

## 2011-11-02 DIAGNOSIS — J852 Abscess of lung without pneumonia: Secondary | ICD-10-CM | POA: Diagnosis present

## 2011-11-02 DIAGNOSIS — J439 Emphysema, unspecified: Secondary | ICD-10-CM | POA: Diagnosis present

## 2011-11-02 DIAGNOSIS — J438 Other emphysema: Secondary | ICD-10-CM | POA: Diagnosis present

## 2011-11-02 HISTORY — DX: Pneumonia, unspecified organism: J18.9

## 2011-11-02 LAB — CBC
MCH: 27.8 pg (ref 26.0–34.0)
MCHC: 36.7 g/dL — ABNORMAL HIGH (ref 30.0–36.0)
MCV: 75.9 fL — ABNORMAL LOW (ref 78.0–100.0)
Platelets: 367 10*3/uL (ref 150–400)
RBC: 3.95 MIL/uL (ref 3.87–5.11)
RDW: 12.6 % (ref 11.5–15.5)

## 2011-11-02 MED ORDER — GUAIFENESIN-DM 100-10 MG/5ML PO SYRP: 5.0000 mL | ORAL_SOLUTION | ORAL | Status: DC | PRN

## 2011-11-02 MED ORDER — ALBUTEROL SULFATE (5 MG/ML) 0.5% IN NEBU: 2.5000 mg | INHALATION_SOLUTION | Freq: Four times a day (QID) | RESPIRATORY_TRACT | Status: DC | PRN

## 2011-11-02 MED ORDER — LEVOFLOXACIN 500 MG PO TABS: 500.0000 mg | ORAL_TABLET | Freq: Every day | ORAL | Status: DC

## 2011-11-02 MED ORDER — DEMECLOCYCLINE HCL 150 MG PO TABS: 150.0000 mg | ORAL_TABLET | Freq: Two times a day (BID) | ORAL | Status: DC

## 2011-11-02 MED ORDER — BOOST / RESOURCE BREEZE PO LIQD: 1.0000 | Freq: Three times a day (TID) | ORAL | Status: DC

## 2011-11-02 MED ORDER — TIOTROPIUM BROMIDE MONOHYDRATE 18 MCG IN CAPS: 18.0000 ug | ORAL_CAPSULE | Freq: Every day | RESPIRATORY_TRACT | Status: DC

## 2011-11-02 MED ORDER — HYDROCODONE-ACETAMINOPHEN 5-325 MG PO TABS: 1.0000 | ORAL_TABLET | ORAL | Status: DC | PRN

## 2011-11-02 MED ORDER — FLUTICASONE-SALMETEROL 250-50 MCG/DOSE IN AEPB: 1.0000 | INHALATION_SPRAY | Freq: Two times a day (BID) | RESPIRATORY_TRACT | Status: DC

## 2011-11-02 NOTE — Progress Notes (Signed)
Talked to patient about follow up medical care; patient was going to [facility] but the Clinic closed; information given to patient about the [facility] and other physician that are accepting new patients. Patient stated that she will look over the information and contact one of the referrals. Abelino Derrick RN, BSN, [employer]

## 2011-11-02 NOTE — Discharge Summary (Signed)
Physician Discharge Summary  Jennifer Hurley BJY:[id number] DOB: April 22, 1955 DOA: 10/28/2011  PCP: No primary provider on file.  Admit date: 10/28/2011 Discharge date: 11/02/2011  Recommendations for Outpatient Follow-up:  1. Follow up with PCP in 1-2 weeks post discharge   Discharge Diagnoses:  Principal Problem:  *CAP (community acquired pneumonia) Active Problems:  CANDIDIASIS, MOUTH (THRUSH)  SUBSTANCE ABUSE, MULTIPLE  HYPERTENSION  COPD  Leukocytosis  Hyponatremia  Hypokalemia  Systolic CHF, chronic  Acute respiratory failure with hypoxia    Discharge Condition: medically stable for discharge home today; patient is in agreement with this plan; per PT evaluation no PT follow up required.  Diet recommendation: as tolertaed  History of present illness:  56 Year old female with history of crack cocaine abuse, smoker, presented to ED with complaints of worsening shortness of breath, productive cough and unintentional weight loss. Patient found to have right sided pneumonia. Repeat CXR 9/20 shows improvement in aeration.   Assessment and Plan:   Principal Problem:  *CAP (community acquired pneumonia)  - CT chest with stable lung nodule and right sided pneumonia  - patient was taking azithromycin and ceftriaxone while in hospital and will be discharged with Levaquin for 7 days - d/ced vancomycin  - patient has declined HIV testing  - follow up blood cultures are all negative to date  - solumedrol discontinued    Active Problems:  Acute exacerbation of COPD  - nebulizer treatments scheduled and prn while in hospital - we will send patient home with albuterol nebs - we will d/c salmeterol but will start advair and instructed the patient to continue spiriva - oxygen support via nasal canula to keep O2 saturation above 90%  - Levaquin for pneumonia for 7 additional days - solumedrol discontinued   Leukocytosis  - secondary to sepsis due to community acquired pneumonia as  well as steroids - steroids discontinued - procalcitonin level 0.21  - follow up blood culture results - negative to date  - follow up respiratory culture results - not good sample and second sample not yet collected  - no fever in more than 24 hours  Hyponatremia  - unclear etiology - patient will continue demeclocycline for additional 5 days - TSH is within normal limits  - follow up urine electrolytes within normal limits  - sodium trending up  Hypokalemia  - WNL   Systolic CHF, chronic  - based on 2 D ECHO in 2009, EF of ~55%  - BNP was only mildly elevated  - patient was given only 1 dose of lasix on admission   Code Status: Full  Family Communication: family not present at bedside  Disposition Plan: home today   Manson Passey, MD  [facility]  Pager [phone]   Discharge Exam: Filed Vitals:   11/01/11 2342  BP: 102/72  Pulse:   Temp:   Resp:    Filed Vitals:   11/01/11 2319 11/01/11 2321 11/01/11 2342 11/02/11 0150  BP: 89/65 88/67 102/72   Pulse: 104     Temp: 97.7 F (36.5 C)     TempSrc: Oral     Resp: 17     Height:      Weight:      SpO2: 98%   93%    General: Pt is alert, follows commands appropriately, not in acute distress Cardiovascular: Regular rate and rhythm, S1/S2 +, no murmurs, no rubs, no gallops Respiratory: some rhonchi still appreciate but significantly improved Abdominal: Soft, non tender, non distended, bowel sounds +, no  guarding Extremities: no edema, no cyanosis, pulses palpable bilaterally DP and PT Neuro: Grossly nonfocal  Discharge Instructions Discussed with the patient and all questioned fully answered.    Medication List     As of 11/02/2011  8:13 AM    START taking these medications         albuterol (5 MG/ML) 0.5% nebulizer solution   Commonly known as: PROVENTIL   Take 0.5 mLs (2.5 mg total) by nebulization every 6 (six) hours as needed for wheezing or shortness of breath.      demeclocycline 150 MG tablet   Commonly  known as: DECLOMYCIN   Take 1 tablet (150 mg total) by mouth every 12 (twelve) hours.      feeding supplement Liqd   Take 1 Container by mouth 3 (three) times daily between meals.      Fluticasone-Salmeterol 250-50 MCG/DOSE Aepb   Commonly known as: ADVAIR   Inhale 1 puff into the lungs 2 (two) times daily.      guaiFENesin-dextromethorphan 100-10 MG/5ML syrup   Commonly known as: ROBITUSSIN DM   Take 5 mLs by mouth every 4 (four) hours as needed for cough.      HYDROcodone-acetaminophen 5-325 MG per tablet   Commonly known as: NORCO/VICODIN   Take 1-2 tablets by mouth every 4 (four) hours as needed for pain.      levofloxacin 500 MG tablet   Commonly known as: LEVAQUIN   Take 1 tablet (500 mg total) by mouth daily.      CONTINUE taking these medications         ibuprofen 200 MG tablet   Commonly known as: ADVIL,MOTRIN      tiotropium 18 MCG inhalation capsule   Commonly known as: SPIRIVA   Place 1 capsule (18 mcg total) into inhaler and inhale daily.      STOP taking these medications         salmeterol 50 MCG/DOSE diskus inhaler   Commonly known as: SEREVENT          Where to get your medications    These are the prescriptions that you need to pick up.   You may get these medications from any pharmacy.         albuterol (5 MG/ML) 0.5% nebulizer solution   demeclocycline 150 MG tablet   feeding supplement Liqd   Fluticasone-Salmeterol 250-50 MCG/DOSE Aepb   guaiFENesin-dextromethorphan 100-10 MG/5ML syrup   HYDROcodone-acetaminophen 5-325 MG per tablet   levofloxacin 500 MG tablet   tiotropium 18 MCG inhalation capsule              The results of significant diagnostics from this hospitalization (including imaging, microbiology, ancillary and laboratory) are listed below for reference.    Significant Diagnostic Studies: Ct Chest W Contrast 10/28/2011  *RADIOLOGY REPORT*  Clinical Data: Shortness of breath and cough.  CT CHEST WITH CONTRAST  Technique:   Multidetector CT imaging of the chest was performed following the standard protocol during bolus administration of intravenous contrast.  Contrast: 80mL OMNIPAQUE IOHEXOL 300 MG/ML  SOLN  Comparison: CT chest 05/23/2008.  One-view portable chest earlier today.  Findings: Extensive consolidation in the right upper lobe without visible proximal obstructing mass or hilar/mediastinal adenopathy. Possible pulmonary abscess laterally with intrapulmonary collection of fluid and air versus fluid-filled large emphysematous bleb (image 23, 16 x 29 mm).  Faint airspace opacities right lower lobe and right middle lobe.  Small to moderate right pleural effusion. Generalized COPD changes.  Normal heart size.  Three-vessel coronary artery calcification.  No pericardial effusion.  No acute osseous findings.  Negative upper abdominal structures.  Trachea midline.  No neck masses.  IMPRESSION: Findings consistent with extensive right lung pneumonia, primarily right upper lobe. Possible developing right upper lobe pulmonary abscess.  Small to moderate right pleural effusion.  No definite proximal obstructing lesion.  Follow-up radiographs should be obtained until the infiltrate clears.  If concern persists for neoplasm, PET scan recommended.   Original Report Authenticated By: Elsie Stain, M.D.    Dg Chest Port 1 View 11/01/2011  *  IMPRESSION: Stable right upper lobe pneumonia, small effusions, and underlying chronic lung disease.   Original Report Authenticated By: Harley Hallmark, M.D.    Dg Chest Port 1 View  10/30/2011  *RADIOLOGY REPORT*  Clinical Data: Pneumonia.  PORTABLE CHEST - 1 VIEW  Comparison: Chest 10/29/2011 and 10/28/2011.  Findings: Dense consolidation in the right upper lobe shows some improvement over the past 2 days.  More patchy airspace opacity in the right mid and lower lung zones appears unchanged since yesterday's study.  Streaky left basilar opacity has improved. Heart size is normal.  Lungs are  severely emphysematous.  No pneumothorax.  Very small right pleural effusion is noted.  IMPRESSION: Improved appearance of right side pneumonia.  Aeration in the left lung base also has improved.   Original Report Authenticated By: Bernadene Bell. Maricela Curet, M.D.    Dg Chest Port 1 View 10/29/2011  *RADIOLOGY REPORT*  Clinical Data: Worsening shortness of breath.  PORTABLE CHEST - 1 VIEW  Comparison: CT and plain film chest 10/28/2011.  Findings: Lungs are severely emphysematous.  Right upper lobe airspace disease persists with worsening aeration in the mid and lower lung zones bilaterally particularly on the right.  Heart size is normal.  IMPRESSION:  1.  No marked change in right upper lobe pneumonia. 2.  New airspace opacities in the mid and lower lung zones, greater on the right, could be due to progressive pneumonia and/or edema. 3.  Emphysema.   Original Report Authenticated By: Bernadene Bell. D'ALESSIO, M.D.    Dg Chest Portable 1 View 10/28/2011  *RADIOLOGY REPORT*  Clinical Data: Weakness and weight loss  PORTABLE CHEST - 1 VIEW  Comparison: Chest radiograph 09/04/2010,, CT 05/23/2008  Findings: There is a new irregular mass lesion in the right upper lobe measuring 7 cm x 7 cm.  Differential for this mass would be neoplasm or infection.  The cardiac silhouette is normal.  Lungs are hyperinflated.  There is bullous change in the apices.  IMPRESSION:  1.  Right upper lobe pneumonia versus carcinoma.  Recommend CT thorax with contrast. 2.  Emphysematous change.   Original Report Authenticated By: Genevive Bi, M.D.     Microbiology: Recent Results (from the past 240 hour(s))  CULTURE, BLOOD (ROUTINE X 2)     Status: Normal (Preliminary result)   Collection Time   10/28/11  1:00 PM      Component Value Range Status Comment   Specimen Description BLOOD   Final    Special Requests BOTTLES DRAWN AEROBIC AND ANAEROBIC   Final    Culture  Setup Time 10/29/2011 01:02   Final    Culture     Final    Value:         BLOOD CULTURE RECEIVED NO GROWTH TO DATE CULTURE WILL BE HELD FOR 5 DAYS BEFORE ISSUING A FINAL NEGATIVE REPORT   Report Status PENDING   Incomplete   CULTURE, BLOOD (  ROUTINE X 2)     Status: Normal (Preliminary result)   Collection Time   10/28/11  1:20 PM      Component Value Range Status Comment   Specimen Description BLOOD LEFT ARM   Final    Special Requests BOTTLES DRAWN AEROBIC AND ANAEROBIC 2CC   Final    Culture  Setup Time 10/29/2011 01:02   Final    Culture     Final    Value:        BLOOD CULTURE RECEIVED NO GROWTH TO DATE CULTURE WILL BE HELD FOR 5 DAYS BEFORE ISSUING A FINAL NEGATIVE REPORT   Report Status PENDING   Incomplete   URINE CULTURE     Status: Normal   Collection Time   10/28/11  1:47 PM      Component Value Range Status Comment   Specimen Description URINE, CATHETERIZED   Final    Special Requests NONE   Final    Culture  Setup Time 10/29/2011 01:32   Final    Colony Count NO GROWTH   Final    Culture NO GROWTH   Final    Report Status 10/30/2011 FINAL   Final   MRSA PCR SCREENING     Status: Normal   Collection Time   10/28/11  6:33 PM      Component Value Range Status Comment   MRSA by PCR NEGATIVE  NEGATIVE Final   CULTURE, EXPECTORATED SPUTUM-ASSESSMENT     Status: Normal   Collection Time   10/28/11  8:02 PM      Component Value Range Status Comment   Specimen Description SPUTUM   Final    Special Requests Normal   Final    Sputum evaluation     Final    Value: MICROSCOPIC FINDINGS SUGGEST THAT THIS SPECIMEN IS NOT REPRESENTATIVE OF LOWER RESPIRATORY SECRETIONS. PLEASE RECOLLECT.     Gram Stain Report Called to,Read Back By and Verified With: S PENDALEY RN 2116 10/28/11 A NAVARRO   Report Status 10/28/2011 FINAL   Final      Labs: Basic Metabolic Panel:  Lab 11/01/11 1610 10/31/11 0534 10/30/11 1451 10/30/11 0320 10/29/11 1436 10/28/11 2046  NA 127* 124* 122* 120* 122* --  K 3.6 3.9 3.8 2.7* 2.7* --  CL 92* 90* 88* 85* 87* --  CO2 25 24 22  24 24  --  GLUCOSE 69* 124* 193* 177* 180* --  BUN 3* <3* <3* <3* <3* --  CREATININE 0.38* 0.27* 0.26* 0.22* 0.30* --  CALCIUM 8.4 8.0* 7.7* 7.3* 7.4* --   Liver Function Tests:  Lab 10/29/11 0320 10/28/11 1340  AST 61* 24  ALT 17 7  ALKPHOS 90 112  BILITOT 0.4 0.4  PROT 6.3 6.7  ALBUMIN 2.1* 2.4*   CBC:  Lab 11/01/11 0418 10/31/11 0534 10/30/11 0320 10/29/11 0320 10/28/11 1340  WBC 13.3* 7.9 10.1 16.2* 12.7*  HGB 9.8* 9.5* 10.6* 12.4 12.1  HCT 27.0* 26.3* 28.2* 34.2* 33.2*  MCV 77.8* 76.9* 76.8* 77.2* 76.3*  PLT 416* 455* 408* 354 419*   BNP: BNP (last 3 results)  Basename 10/28/11 2052  PROBNP 133.2*   CBG:  Lab 11/01/11 0835 11/01/11 0740 10/30/11 0742 10/29/11 0751  GLUCAP 79 53* 173* 84    Time coordinating discharge: Over 30 minutes  Signed:  Manson Passey, MD  [facility] 11/02/2011, 5:55 AM  Pager #: [phone]

## 2011-11-02 NOTE — ED Notes (Signed)
Brought in by EMS from home with c/o generalized weakness.  Per EMS, pt was just discharged today for a few days of hospitalization for Pneumonia.  Per EMS, pt has no particular "medical complaint" upon arrival to pt's home; pt reported "Well, I can't take care of myself", pt finally stated "I'm weak" when EMS told her that she can not be taken to ED just because nobody takes care of her.  Presents to ED with c/o weakness and cough.

## 2011-11-02 NOTE — Progress Notes (Signed)
Talked to patient about HHC, choices offered, patient chose [facility]; Norberta Keens RN with [facility] called for arrangements and ordered nebulizer machine; B Shelba Flake

## 2011-11-02 NOTE — Progress Notes (Addendum)
Physical Therapy Treatment Patient Details Name: Jennifer Hurley MRN: [id number] DOB: 07/19/1955 Today's Date: 11/02/2011 Time: 0910-0920 PT Time Calculation (min): 10 min  PT Assessment / Plan / Recommendation Comments on Treatment Session  Attempted PT tx session to further assess ambulation/mobility. Once standing pt c/o dizziness and being cold. Declined to ambulate with therapist despite encouragment. Noted pt lives alone. Recommend supervision/assist at home initially. RN in huddle. Spoke with nurse tech and recommended nursing attempt to walk with pt prior to d/c home today. Spoke with RN about concerns for pt's mobility at a later time. Recommended HHPT for safety evaluation and further treatment if needed.     Follow Up Recommendations  May benefit from HHPT. Supervision for OOB/mobility    Barriers to Discharge        Equipment Recommendations  Unable to accurately assess due to pt's refusal to ambulate with therapy. Could possibly benefit from RW.     Recommendations for Other Services OT consult  Frequency Min 3X/week   Plan Discharge plan remains appropriate    Precautions / Restrictions Precautions Precautions: Fall Restrictions Weight Bearing Restrictions: No   Pertinent Vitals/Pain BP: 92/67. O2 sats 96% on RA at rest.     Mobility  Bed Mobility Bed Mobility: Supine to Sit;Sit to Supine Supine to Sit: 5: Supervision Sit to Supine: 5: Supervision Details for Bed Mobility Assistance: Increased time. VCs for instruction.  Transfers Transfers: Sit to Stand;Stand to Sit Sit to Stand: 4: Min assist;From bed Stand to Sit: 4: Min guard;To bed Details for Transfer Assistance: VCs safety, technique, hand placement. Assist to rise, stabilize. Pt c/o dizziness with standing for 1-2 minutes.  Ambulation/Gait Ambulation/Gait Assistance Details: Pt declined to ambulate with therapist due to being cold and dizziness.  BP 92/67 after standing at EOB.  Pt requested to return to  bed.    Exercises     PT Diagnosis:    PT Problem List:   PT Treatment Interventions:     PT Goals Acute Rehab PT Goals Pt will go Sit to Stand: Independently PT Goal: Sit to Stand - Progress: Progressing toward goal Pt will go Stand to Sit: Independently PT Goal: Stand to Sit - Progress: Progressing toward goal  Visit Information  Last PT Received On: 11/02/11 Assistance Needed: +1    Subjective Data  Subjective: "Its cold in here" Patient Stated Goal: to get warm   Cognition  Overall Cognitive Status: Appears within functional limits for tasks assessed/performed Arousal/Alertness: Awake/alert Orientation Level: Appears intact for tasks assessed Behavior During Session: [facility] for tasks performed    Balance     End of Session PT - End of Session Equipment Utilized During Treatment: Gait belt Activity Tolerance:  (Limited by pt's c/o dizziness and pt unwillingness to [facility]) Patient left: in bed;with call bell/phone within reach;with bed alarm set   GP     Rebeca Alert [facility] 11/02/2011, 9:30 AM [phone]

## 2011-11-03 ENCOUNTER — Encounter (HOSPITAL_COMMUNITY): Payer: Self-pay | Admitting: Family Medicine

## 2011-11-03 ENCOUNTER — Emergency Department (HOSPITAL_COMMUNITY): Payer: Medicare Other

## 2011-11-03 DIAGNOSIS — E871 Hypo-osmolality and hyponatremia: Principal | ICD-10-CM

## 2011-11-03 LAB — BASIC METABOLIC PANEL
BUN: <3 mg/dL — ABNORMAL LOW (ref 6–23)
BUN: 3 mg/dL — ABNORMAL LOW (ref 6–23)
BUN: 4 mg/dL — ABNORMAL LOW (ref 6–23)
BUN: 3 mg/dL — ABNORMAL LOW (ref 6–23)
CO2: 21 mEq/L (ref 19–32)
CO2: 24 mEq/L (ref 19–32)
CO2: 25 mEq/L (ref 19–32)
Calcium: 8.6 mg/dL (ref 8.4–10.5)
Calcium: 7.9 mg/dL — ABNORMAL LOW (ref 8.4–10.5)
Calcium: 8.1 mg/dL — ABNORMAL LOW (ref 8.4–10.5)
Calcium: 7.9 mg/dL — ABNORMAL LOW (ref 8.4–10.5)
Chloride: 88 mEq/L — ABNORMAL LOW (ref 96–112)
Creatinine, Ser: 0.36 mg/dL — ABNORMAL LOW (ref 0.50–1.10)
Creatinine, Ser: 0.38 mg/dL — ABNORMAL LOW (ref 0.50–1.10)
Creatinine, Ser: 0.35 mg/dL — ABNORMAL LOW (ref 0.50–1.10)
Creatinine, Ser: 0.36 mg/dL — ABNORMAL LOW (ref 0.50–1.10)
GFR calc Af Amer: >90 mL/min (ref >90)
GFR calc Af Amer: >90 mL/min (ref >90)
GFR calc non Af Amer: >90 mL/min (ref >90)
GFR calc non Af Amer: >90 mL/min (ref >90)
GFR calc non Af Amer: >90 mL/min (ref >90)
Glucose, Bld: 96 mg/dL (ref 70–99)
Glucose, Bld: 123 mg/dL — ABNORMAL HIGH (ref 70–99)
Glucose, Bld: 84 mg/dL (ref 70–99)
Potassium: 3.8 mEq/L (ref 3.5–5.1)
Sodium: 120 mEq/L — ABNORMAL LOW (ref 135–145)
Sodium: 124 mEq/L — ABNORMAL LOW (ref 135–145)

## 2011-11-03 LAB — COMPREHENSIVE METABOLIC PANEL
ALT: 10 U/L (ref 0–35)
AST: 18 U/L (ref 0–37)
Alkaline Phosphatase: 71 U/L (ref 39–117)
CO2: 25 mEq/L (ref 19–32)
Calcium: 8.1 mg/dL — ABNORMAL LOW (ref 8.4–10.5)
Potassium: 3.3 mEq/L — ABNORMAL LOW (ref 3.5–5.1)
Sodium: 122 mEq/L — ABNORMAL LOW (ref 135–145)
Total Protein: 5.6 g/dL — ABNORMAL LOW (ref 6.0–8.3)

## 2011-11-03 LAB — OSMOLALITY: Osmolality: 250 mOsm/kg — ABNORMAL LOW (ref 275–300)

## 2011-11-03 LAB — NA AND K (SODIUM & POTASSIUM), RAND UR: Potassium Urine: 11 mEq/L

## 2011-11-03 MED ORDER — IPRATROPIUM BROMIDE 0.02 % IN SOLN
0.5000 mg | RESPIRATORY_TRACT | Status: DC | PRN
  Filled 2011-11-03: qty 2.5

## 2011-11-03 MED ORDER — GUAIFENESIN-DM 100-10 MG/5ML PO SYRP
5.0000 mL | ORAL_SOLUTION | ORAL | Status: DC | PRN
  Administered 2011-11-03 – 2011-11-06 (×8): 5 mL via ORAL
  Filled 2011-11-03 (×8): qty 10

## 2011-11-03 MED ORDER — VANCOMYCIN HCL 500 MG IV SOLR
500.0000 mg | INTRAVENOUS | Status: DC
  Administered 2011-11-04 – 2011-11-07 (×4): 500 mg via INTRAVENOUS
  Filled 2011-11-03 (×4): qty 500

## 2011-11-03 MED ORDER — SODIUM CHLORIDE 1 G PO TABS
1.0000 g | ORAL_TABLET | Freq: Once | ORAL | Status: AC
  Administered 2011-11-03: 1 g via ORAL
  Filled 2011-11-03: qty 1

## 2011-11-03 MED ORDER — ONDANSETRON HCL 4 MG/2ML IJ SOLN: 4.0000 mg | Freq: Four times a day (QID) | INTRAMUSCULAR | Status: DC | PRN

## 2011-11-03 MED ORDER — HEPARIN SODIUM (PORCINE) 5000 UNIT/ML IJ SOLN
5000.0000 [IU] | Freq: Three times a day (TID) | INTRAMUSCULAR | Status: DC
  Administered 2011-11-03 – 2011-11-12 (×27): 5000 [IU] via SUBCUTANEOUS
  Filled 2011-11-03 (×34): qty 1

## 2011-11-03 MED ORDER — BOOST / RESOURCE BREEZE PO LIQD
1.0000 | Freq: Every day | ORAL | Status: DC
  Administered 2011-11-04: 1 via ORAL

## 2011-11-03 MED ORDER — ALBUTEROL SULFATE (5 MG/ML) 0.5% IN NEBU
2.5000 mg | INHALATION_SOLUTION | Freq: Four times a day (QID) | RESPIRATORY_TRACT | Status: DC
  Administered 2011-11-04: 2.5 mg via RESPIRATORY_TRACT
  Filled 2011-11-03: qty 0.5

## 2011-11-03 MED ORDER — ALBUTEROL SULFATE (5 MG/ML) 0.5% IN NEBU
2.5000 mg | INHALATION_SOLUTION | RESPIRATORY_TRACT | Status: DC | PRN
  Administered 2011-11-04: 2.5 mg via RESPIRATORY_TRACT
  Filled 2011-11-03: qty 0.5

## 2011-11-03 MED ORDER — SENNOSIDES-DOCUSATE SODIUM 8.6-50 MG PO TABS
1.0000 | ORAL_TABLET | Freq: Every evening | ORAL | Status: DC | PRN
  Filled 2011-11-03: qty 1

## 2011-11-03 MED ORDER — ALUM & MAG HYDROXIDE-SIMETH 200-200-20 MG/5ML PO SUSP
30.0000 mL | Freq: Four times a day (QID) | ORAL | Status: DC | PRN
  Administered 2011-11-12: 30 mL via ORAL
  Filled 2011-11-03: qty 30

## 2011-11-03 MED ORDER — ENSURE COMPLETE PO LIQD
237.0000 mL | Freq: Two times a day (BID) | ORAL | Status: DC
  Administered 2011-11-03 – 2011-11-04 (×2): 237 mL via ORAL

## 2011-11-03 MED ORDER — ALBUTEROL SULFATE (5 MG/ML) 0.5% IN NEBU
2.5000 mg | INHALATION_SOLUTION | Freq: Four times a day (QID) | RESPIRATORY_TRACT | Status: DC
  Administered 2011-11-03 (×2): 2.5 mg via RESPIRATORY_TRACT
  Filled 2011-11-03 (×2): qty 0.5

## 2011-11-03 MED ORDER — ONDANSETRON HCL 4 MG PO TABS: 4.0000 mg | ORAL_TABLET | Freq: Four times a day (QID) | ORAL | Status: DC | PRN

## 2011-11-03 MED ORDER — ACETAMINOPHEN 650 MG RE SUPP: 650.0000 mg | Freq: Four times a day (QID) | RECTAL | Status: DC | PRN

## 2011-11-03 MED ORDER — BIOTENE DRY MOUTH MT LIQD
15.0000 mL | Freq: Two times a day (BID) | OROMUCOSAL | Status: DC
  Administered 2011-11-03 – 2011-11-13 (×16): 15 mL via OROMUCOSAL

## 2011-11-03 MED ORDER — POTASSIUM CHLORIDE CRYS ER 20 MEQ PO TBCR
40.0000 meq | EXTENDED_RELEASE_TABLET | Freq: Once | ORAL | Status: AC
  Administered 2011-11-03: 40 meq via ORAL
  Filled 2011-11-03: qty 2

## 2011-11-03 MED ORDER — SODIUM CHLORIDE 0.9 % IV SOLN: 250.0000 mL | INTRAVENOUS | Status: DC | PRN

## 2011-11-03 MED ORDER — ALBUTEROL SULFATE (5 MG/ML) 0.5% IN NEBU: 2.5000 mg | INHALATION_SOLUTION | RESPIRATORY_TRACT | Status: DC | PRN

## 2011-11-03 MED ORDER — SODIUM CHLORIDE 0.9 % IV BOLUS (SEPSIS)
500.0000 mL | Freq: Once | INTRAVENOUS | Status: AC
  Administered 2011-11-03: 500 mL via INTRAVENOUS

## 2011-11-03 MED ORDER — BENZONATATE 100 MG PO CAPS
100.0000 mg | ORAL_CAPSULE | Freq: Three times a day (TID) | ORAL | Status: DC | PRN
  Administered 2011-11-04: 100 mg via ORAL
  Filled 2011-11-03 (×2): qty 1

## 2011-11-03 MED ORDER — ACETAMINOPHEN 325 MG PO TABS
650.0000 mg | ORAL_TABLET | Freq: Four times a day (QID) | ORAL | Status: DC | PRN
  Administered 2011-11-03 (×2): 650 mg via ORAL
  Administered 2011-11-04 (×2): 325 mg via ORAL
  Administered 2011-11-06 – 2011-11-13 (×9): 650 mg via ORAL
  Filled 2011-11-03 (×2): qty 2
  Filled 2011-11-03: qty 1
  Filled 2011-11-03 (×7): qty 2
  Filled 2011-11-03: qty 1
  Filled 2011-11-03: qty 2
  Filled 2011-11-03 (×2): qty 1
  Filled 2011-11-03: qty 2

## 2011-11-03 MED ORDER — HYDROCODONE-ACETAMINOPHEN 5-325 MG PO TABS
1.0000 | ORAL_TABLET | ORAL | Status: DC | PRN
  Administered 2011-11-07: 1 via ORAL
  Administered 2011-11-07 – 2011-11-08 (×3): 2 via ORAL
  Administered 2011-11-08 – 2011-11-13 (×5): 1 via ORAL
  Filled 2011-11-03 (×3): qty 2
  Filled 2011-11-03 (×5): qty 1
  Filled 2011-11-03: qty 2

## 2011-11-03 MED ORDER — SODIUM CHLORIDE 0.9 % IV SOLN
Freq: Once | INTRAVENOUS | Status: AC
  Administered 2011-11-03: 03:00:00 via INTRAVENOUS

## 2011-11-03 MED ORDER — SODIUM CHLORIDE 0.9 % IJ SOLN: 3.0000 mL | INTRAMUSCULAR | Status: DC | PRN

## 2011-11-03 MED ORDER — VANCOMYCIN HCL IN DEXTROSE 1-5 GM/200ML-% IV SOLN
1000.0000 mg | Freq: Once | INTRAVENOUS | Status: AC
  Administered 2011-11-03: 1000 mg via INTRAVENOUS
  Filled 2011-11-03 (×2): qty 200

## 2011-11-03 MED ORDER — PIPERACILLIN-TAZOBACTAM 3.375 G IVPB
3.3750 g | Freq: Three times a day (TID) | INTRAVENOUS | Status: DC
  Administered 2011-11-03 – 2011-11-08 (×17): 3.375 g via INTRAVENOUS
  Filled 2011-11-03 (×19): qty 50

## 2011-11-03 MED ORDER — IPRATROPIUM BROMIDE 0.02 % IN SOLN: 0.5000 mg | RESPIRATORY_TRACT | Status: DC | PRN

## 2011-11-03 MED ORDER — NICOTINE 14 MG/24HR TD PT24
14.0000 mg | MEDICATED_PATCH | Freq: Every day | TRANSDERMAL | Status: DC
  Administered 2011-11-03 – 2011-11-13 (×11): 14 mg via TRANSDERMAL
  Filled 2011-11-03 (×11): qty 1

## 2011-11-03 MED ORDER — BOOST / RESOURCE BREEZE PO LIQD
1.0000 | Freq: Three times a day (TID) | ORAL | Status: DC
  Administered 2011-11-03: 1 via ORAL
  Filled 2011-11-03: qty 1

## 2011-11-03 NOTE — ED Provider Notes (Signed)
History     CSN: [id number]  Arrival date & time 11/02/11  2111   First MD Initiated Contact with Patient 11/02/11 2315      Chief Complaint  Patient presents with  . Weakness    (Consider location/radiation/quality/duration/timing/severity/associated sxs/prior treatment) HPI 56 year old female presents to emergency room via EMS with complaint of generalized weakness. Patient was discharged earlier in the day from the hospital secondary to pneumonia. She reports upon arriving home, she's been too weak to get out of bed. She is unable to feed herself due to weakness. She reports when she did get of bed onto the bathroom, she fell against the side of the tub, bruising her right ribs. She denies any fever. She has had persistent cough.  Past Medical History  Diagnosis Date  . Rheumatoid arthritis   . Bronchitis   . Myocardial infarction   . Pneumonia     Past Surgical History  Procedure Date  . Tonsillectomy   . Tubal ligation     Family History  Problem Relation Age of Onset  . Coronary artery disease    . Diabetes type II      History  Substance Use Topics  . Smoking status: Current Every Day Smoker -- 1.0 packs/day for 39 years  . Smokeless tobacco: Never Used  . Alcohol Use: Yes     quart of beer per day    OB History    Grav Para Term Preterm Abortions TAB SAB Ect Mult Living                  Review of Systems  All other systems reviewed and are negative.    Allergies  Review of patient's allergies indicates no known allergies.  Home Medications  No current outpatient prescriptions on file.  BP 106/80  Pulse 101  Temp 98.2 F (36.8 C) (Oral)  Resp 20  Ht 5\' 1"  (1.549 m)  Wt 78 lb 4.2 oz (35.5 kg)  BMI 14.79 kg/m2  SpO2 100%  Physical Exam  Nursing note and vitals reviewed. Constitutional:        Cachectic female, listless in the bed, awake and alert complaining of pain  HENT:  Head: Normocephalic and atraumatic.  Nose: Nose normal.    Mouth/Throat: Oropharynx is clear and moist.  Neck: Normal range of motion. Neck supple. No JVD present. No tracheal deviation present. No thyromegaly present.  Cardiovascular: Regular rhythm, normal heart sounds and intact distal pulses.  Exam reveals no gallop and no friction rub.   No murmur heard.      Tachycardia noted  Pulmonary/Chest: Effort normal. No stridor. No respiratory distress. She has no wheezes. She has rales (course lung sounds on the right side). She exhibits no tenderness.       Bruising or crepitus noted along area of pain where patient reports she fell.  Abdominal: Soft. Bowel sounds are normal. She exhibits no distension and no mass. There is no tenderness. There is no rebound and no guarding.  Musculoskeletal: Normal range of motion. She exhibits no edema and no tenderness.  Lymphadenopathy:    She has no cervical adenopathy.  Skin: Skin is warm and dry. No rash noted. She is not diaphoretic. No erythema. No pallor.    ED Course  Procedures (including critical care time)  Labs Reviewed  CBC - Abnormal; Notable for the following:    WBC 14.6 (*)     Hemoglobin 11.0 (*)     HCT 30.0 (*)  MCV 75.9 (*)     MCHC 36.7 (*)     All other components within normal limits  BASIC METABOLIC PANEL - Abnormal; Notable for the following:    Sodium 120 (*)     Potassium 3.2 (*)     Chloride 84 (*)     BUN 3 (*)     Creatinine, Ser 0.36 (*)     All other components within normal limits  COMPREHENSIVE METABOLIC PANEL - Abnormal; Notable for the following:    Sodium 122 (*)     Potassium 3.3 (*)     Chloride 87 (*)     Glucose, Bld 124 (*)     BUN <3 (*)  REPEATED TO VERIFY   Creatinine, Ser 0.34 (*)     Calcium 8.1 (*)     Total Protein 5.6 (*)     Albumin 1.9 (*)     All other components within normal limits  CBC - Abnormal; Notable for the following:    WBC 12.5 (*)     RBC 3.56 (*)     Hemoglobin 9.8 (*)     HCT 27.0 (*)     MCV 75.8 (*)     All other  components within normal limits  TSH  OSMOLALITY  OSMOLALITY, URINE  LEGIONELLA ANTIGEN, URINE  NA AND K (SODIUM & POTASSIUM), RAND UR   Dg Chest 2 View  11/03/2011  *RADIOLOGY REPORT*  Clinical Data: Cough.  Chest pain.  CHEST - 2 VIEW  Comparison: Radiograph 11/01/2011.  Findings: Emphysema and chronic lung disease is present with right upper lobe pneumonia.  Increasing cavitation in the right upper lobe is present compatible with necrotizing pneumonia.  This was better demonstrated on recent chest CT 10/28/2011.  Blunting of the costophrenic angles.  Cardiopericardial silhouette appears within normal limits.  No new airspace disease.  IMPRESSION:  1. Decreasing area of right upper lobe pneumonia with peripheral cavitation compatible with necrotizing pneumonia. 2.  Emphysema and chronic lung disease. 3.  Small bilateral pleural effusions.   Original Report Authenticated By: Andreas Newport, M.D.    Dg Chest Port 1 View  11/01/2011  *RADIOLOGY REPORT*  Clinical Data: 56 year old female with cough and pneumonia.  PORTABLE CHEST - 1 VIEW  Comparison: 10/30/2011 and earlier.  Findings: Portable semi upright AP view 1556 hours.  Continued confluent airspace disease in the lateral aspect of the right upper lobe.  No significant interval change.  Stable lung volumes.  No pneumothorax.  Small bilateral effusions are stable.  Cardiac size and mediastinal contours are within normal limits.  Visualized tracheal air column is within normal limits.  No new airspace opacity.  Emphysema.  IMPRESSION: Stable right upper lobe pneumonia, small effusions, and underlying chronic lung disease.   Original Report Authenticated By: Harley Hallmark, M.D.      1. Hyponatremia   2. Generalized weakness   3. Failure to thrive in adult   4. Community acquired pneumonia   5. CAP (community acquired pneumonia)       MDM  56 year old female with acute on chronic hyponatremia along with generalized weakness after discharge  earlier in the day for 20 acquired pneumonia. Discuss with hospitalist for admission back to the hospital. Patient may require rehabilitation stay if she is unable to care for herself       Olivia Mackie, MD 11/03/11 [phone]

## 2011-11-03 NOTE — Progress Notes (Signed)
Patient seen and evaluated by my associate earlier this morning.  Please review their h and p for further details regarding medical plan.  I have personally reviewed prior d/c summary on 9/23  Will check sodium levels q 4 hours to make sure patient does not overcorrect too fast.  Looking for an increase in sodium level of 8 meq per day.  Yolanda Dockendorf, [employer]

## 2011-11-03 NOTE — Progress Notes (Signed)
   CARE MANAGEMENT NOTE 11/03/2011  Patient:  Jennifer Hurley, Jennifer Hurley   Account Number:  [id number]  Date Initiated:  11/03/2011  Documentation initiated by:  Jiles Crocker  Subjective/Objective Assessment:   ADMITTED WITH Hyponatremia/SIADH     Action/Plan:   PATIENT LIVES ALONE; INFORMATION GIVEN TO PATIENT ABOUT FINDING A PCP; PATIENT REFUSED SNF PLACEMENT AND STATED THAT SHE WILL FIND SOMEONE TO STAY WITH HER; ACTIVE WITH [facility].   Anticipated DC Date:  11/07/2011   Anticipated DC Plan:  HOME W HOME HEALTH SERVICES      DC Planning Services  CM consult               Status of service:  In process, will continue to follow Medicare Important Message given?  NA - LOS <3 / Initial given by admissions (If response is "NO", the following Medicare IM given date fields will be blank)  Per UR Regulation:  Reviewed for med. necessity/level of care/duration of stay  Comments:  11/03/2011- B Jetaime Pinnix RN, BSN, MHA

## 2011-11-03 NOTE — Progress Notes (Signed)
RNCM, Steward Drone & CSW spoke with patient re: discharge planning. Note patient was discharged back home alone yesterday and states that she fell this morning at home in the bathroom and came back to the ED. Patient states that she does not want to go to a SNF at discharge for rehab and wants to return home. RNCM encouraged patient to participate in PT. She had refused to ambulate with PT yesterday before she was discharged. CSW to follow-up after PT evaluation.  Unice Bailey, LCSW [facility] Clinical Social Worker cell #: [phone]

## 2011-11-03 NOTE — H&P (Signed)
PCP:   Dr. Duanne Guess   Chief Complaint:  Fall  HPI:  this is a 56 year old female who was just discharged from [facility] today after being treated for pneumonia. Patient states at home she was weak and fell in the bathroom. She states she did not hit her head and she did not lose consciousness. She came back to the ER. Patient does not report any worsening symptoms, no fevers, no shortness of breath, no wheezing. The patient lives alone. Labs in the ER revealed a worsened hyponatremia with a sodium of 120, with a slightly increased white blood cell count, the hospitalist was requested to admit patient.  Patient states that inpatient rehabilitation was discussed and at discharge and she declined stating she wanted to go home. On reviewing Pt records, patient had also declined inpatient PT, there notes reflect they were unable to properly assess patient's strength due to this.  Review of Systems:  The patient denies anorexia, fever, weight loss, vision loss, decreased hearing, hoarseness, chest pain, syncope, dyspnea on exertion, peripheral edema, balance deficits, hemoptysis, abdominal pain, melena, hematochezia, severe indigestion/heartburn, hematuria, incontinence, genital sores, suspicious skin lesions, transient blindness, depression, unusual weight change, abnormal bleeding, enlarged lymph nodes, angioedema, and breast masses.  Past Medical History: Past Medical History  Diagnosis Date  . Rheumatoid arthritis   . Bronchitis   . Myocardial infarction   . Pneumonia    Past Surgical History  Procedure Date  . Tonsillectomy   . Tubal ligation     Medications: Prior to Admission medications   Medication Sig Start Date End Date Taking? Authorizing Provider  albuterol (PROVENTIL) (5 MG/ML) 0.5% nebulizer solution Take 0.5 mLs (2.5 mg total) by nebulization every 6 (six) hours as needed for wheezing or shortness of breath. 11/02/11  Yes Alison Murray, MD  demeclocycline (DECLOMYCIN) 150 MG  tablet Take 1 tablet (150 mg total) by mouth every 12 (twelve) hours. 11/02/11  Yes Alison Murray, MD  feeding supplement (RESOURCE BREEZE) LIQD Take 1 Container by mouth 3 (three) times daily between meals. 11/02/11  Yes Alison Murray, MD  Fluticasone-Salmeterol (ADVAIR DISKUS) 250-50 MCG/DOSE AEPB Inhale 1 puff into the lungs 2 (two) times daily. 11/02/11  Yes Alison Murray, MD  guaiFENesin-dextromethorphan (ROBITUSSIN DM) 100-10 MG/5ML syrup Take 5 mLs by mouth every 4 (four) hours as needed for cough. 11/02/11  Yes Alison Murray, MD  HYDROcodone-acetaminophen (NORCO/VICODIN) 5-325 MG per tablet Take 1-2 tablets by mouth every 4 (four) hours as needed for pain. 11/02/11  Yes Alison Murray, MD  ibuprofen (ADVIL,MOTRIN) 200 MG tablet Take 200 mg by mouth every 6 (six) hours as needed. Pain   Yes Historical Provider, MD  levofloxacin (LEVAQUIN) 500 MG tablet Take 1 tablet (500 mg total) by mouth daily. 11/02/11  Yes Alison Murray, MD  tiotropium (SPIRIVA) 18 MCG inhalation capsule Place 1 capsule (18 mcg total) into inhaler and inhale daily. 11/02/11  Yes Alison Murray, MD    Allergies:  No Known Allergies  Social History:  reports that she has been smoking.  She has never used smokeless tobacco. She reports that she drinks alcohol. She reports that she uses illicit drugs (Cocaine and Marijuana).  Family History: Coronary artery disease, diabetes  Physical Exam: Filed Vitals:   11/03/11 0053 11/03/11 0054 11/03/11 0059 11/03/11 0215  BP: 100/77 128/78 111/80 99/64  Pulse: 112 115 119   Temp:    97.1 F (36.2 C)  TempSrc:    Oral  Resp:    20  SpO2:    100%    General:  Alert and oriented times three, somewhat cachectic, no acute distress Eyes: PERRLA, pink conjunctiva, no scleral icterus ENT: Moist oral mucosa, neck supple, no thyromegaly Lungs: clear to ascultation, no wheeze, no crackles, no use of accessory muscles Cardiovascular: regular rate and rhythm, no regurgitation, no gallops,  no murmurs. No carotid bruits, no JVD Abdomen: soft, positive BS, non-tender, non-distended, no organomegaly, not an acute abdomen GU: not examined Neuro: CN II - XII grossly intact, sensation intact Musculoskeletal: strength 5/5 all extremities, no clubbing, cyanosis or edema Skin: no rash, no subcutaneous crepitation, no decubitus    Labs on Admission:   Basename 11/02/11 2317 11/01/11 0418 10/31/11 0534  NA 120* 127* --  K 3.2* 3.6 --  CL 84* 92* --  CO2 26 25 --  GLUCOSE 80 69* --  BUN 3* 3* --  CREATININE 0.36* 0.38* --  CALCIUM 8.6 8.4 --  MG -- -- 2.2  PHOS -- -- --   No results found for this basename: AST:2,ALT:2,ALKPHOS:2,BILITOT:2,PROT:2,ALBUMIN:2 in the last 72 hours No results found for this basename: LIPASE:2,AMYLASE:2 in the last 72 hours  Basename 11/02/11 2317 11/01/11 0418  WBC 14.6* 13.3*  NEUTROABS -- --  HGB 11.0* 9.8*  HCT 30.0* 27.0*  MCV 75.9* 77.8*  PLT 367 416*   No results found for this basename: CKTOTAL:3,CKMB:3,CKMBINDEX:3,TROPONINI:3 in the last 72 hours No components found with this basename: POCBNP:3 No results found for this basename: DDIMER:2 in the last 72 hours No results found for this basename: HGBA1C:2 in the last 72 hours No results found for this basename: CHOL:2,HDL:2,LDLCALC:2,TRIG:2,CHOLHDL:2,LDLDIRECT:2 in the last 72 hours No results found for this basename: TSH,T4TOTAL,FREET3,T3FREE,THYROIDAB in the last 72 hours No results found for this basename: VITAMINB12:2,FOLATE:2,FERRITIN:2,TIBC:2,IRON:2,RETICCTPCT:2 in the last 72 hours  Micro Results: Recent Results (from the past 240 hour(s))  CULTURE, BLOOD (ROUTINE X 2)     Status: Normal (Preliminary result)   Collection Time   10/28/11  1:00 PM      Component Value Range Status Comment   Specimen Description BLOOD   Final    Special Requests BOTTLES DRAWN AEROBIC AND ANAEROBIC   Final    Culture  Setup Time 10/29/2011 01:02   Final    Culture     Final    Value:         BLOOD CULTURE RECEIVED NO GROWTH TO DATE CULTURE WILL BE HELD FOR 5 DAYS BEFORE ISSUING A FINAL NEGATIVE REPORT   Report Status PENDING   Incomplete   CULTURE, BLOOD (ROUTINE X 2)     Status: Normal (Preliminary result)   Collection Time   10/28/11  1:20 PM      Component Value Range Status Comment   Specimen Description BLOOD LEFT ARM   Final    Special Requests BOTTLES DRAWN AEROBIC AND ANAEROBIC 2CC   Final    Culture  Setup Time 10/29/2011 01:02   Final    Culture     Final    Value:        BLOOD CULTURE RECEIVED NO GROWTH TO DATE CULTURE WILL BE HELD FOR 5 DAYS BEFORE ISSUING A FINAL NEGATIVE REPORT   Report Status PENDING   Incomplete   URINE CULTURE     Status: Normal   Collection Time   10/28/11  1:47 PM      Component Value Range Status Comment   Specimen Description URINE, CATHETERIZED   Final  Special Requests NONE   Final    Culture  Setup Time 10/29/2011 01:32   Final    Colony Count NO GROWTH   Final    Culture NO GROWTH   Final    Report Status 10/30/2011 FINAL   Final   MRSA PCR SCREENING     Status: Normal   Collection Time   10/28/11  6:33 PM      Component Value Range Status Comment   MRSA by PCR NEGATIVE  NEGATIVE Final   CULTURE, EXPECTORATED SPUTUM-ASSESSMENT     Status: Normal   Collection Time   10/28/11  8:02 PM      Component Value Range Status Comment   Specimen Description SPUTUM   Final    Special Requests Normal   Final    Sputum evaluation     Final    Value: MICROSCOPIC FINDINGS SUGGEST THAT THIS SPECIMEN IS NOT REPRESENTATIVE OF LOWER RESPIRATORY SECRETIONS. PLEASE RECOLLECT.     Gram Stain Report Called to,Read Back By and Verified With: S [facility] RN 2116 10/28/11 A NAVARRO   Report Status 10/28/2011 FINAL   Final      Radiological Exams on Admission: Dg Chest 2 View  11/03/2011  *RADIOLOGY REPORT*  Clinical Data: Cough.  Chest pain.  CHEST - 2 VIEW  Comparison: Radiograph 11/01/2011.  Findings: Emphysema and chronic lung disease is present  with right upper lobe pneumonia.  Increasing cavitation in the right upper lobe is present compatible with necrotizing pneumonia.  This was better demonstrated on recent chest CT 10/28/2011.  Blunting of the costophrenic angles.  Cardiopericardial silhouette appears within normal limits.  No new airspace disease.  IMPRESSION:  1. Decreasing area of right upper lobe pneumonia with peripheral cavitation compatible with necrotizing pneumonia. 2.  Emphysema and chronic lung disease. 3.  Small bilateral pleural effusions.   Original Report Authenticated By: Andreas Newport, M.D.    Dg Chest Port 1 View  11/01/2011  *RADIOLOGY REPORT*  Clinical Data: 56 year old female with cough and pneumonia.  PORTABLE CHEST - 1 VIEW  Comparison: 10/30/2011 and earlier.  Findings: Portable semi upright AP view 1556 hours.  Continued confluent airspace disease in the lateral aspect of the right upper lobe.  No significant interval change.  Stable lung volumes.  No pneumothorax.  Small bilateral effusions are stable.  Cardiac size and mediastinal contours are within normal limits.  Visualized tracheal air column is within normal limits.  No new airspace opacity.  Emphysema.  IMPRESSION: Stable right upper lobe pneumonia, small effusions, and underlying chronic lung disease.   Original Report Authenticated By: Harley Hallmark, M.D.     Assessment/Plan Present on Admission:  .Hyponatremia/SIADH Readmit to MedSurg  Fluid restrict  Urine sodium, osmolarity, plasma osmolarity and TSH ordered  Repeat BMP in a.m.  Fall/debility   multifactorial, related to patient's hyponatremia. Patient was weak at discharge however she refused physical therapy. Per PT's last note they were unable to properly assess due to patient's refusal to partake with PT PTT reconsulted  .CAP (community acquired pneumonia) .Leukocytosis  Improved but with evidence of necrotizing pneumonia  Antibiotic continued  .Hypokalemia Will  replete .COPD Nebulizers ordered During my interview patient denies tobacco, alcohol or any illicit drugs. Previous H&P doesn't reflect this. Will place patient on nicotine patch. Continue to monitor.   Full code DVT prophylaxis   Cynethia Schindler 11/03/2011, 4:04 AM

## 2011-11-03 NOTE — Evaluation (Signed)
Physical Therapy Evaluation Patient Details Name: Jennifer Hurley MRN: [id number] DOB: 04-21-1955 Today's Date: 11/03/2011 Time: 5621-3086 PT Time Calculation (min): 17 min  PT Assessment / Plan / Recommendation Clinical Impression  Pt. admitted same day of DC after being in hospital w/ Pneumonia. Pt. had a fall. Pt. has no caregivers. Pt. will benefit from PT to improve functional mobility. Pt. really needs 24/7 assistance or  SNF. Pt. stated she did not want to go.    PT Assessment  Patient needs continued PT services    Follow Up Recommendations  Skilled nursing facility    Barriers to Discharge Decreased caregiver support      Equipment Recommendations  Rolling walker with 5" wheels    Recommendations for Other Services OT consult   Frequency Min 3X/week    Precautions / Restrictions Precautions Precautions: Fall Precaution Comments: monitor sats   Pertinent Vitals/Pain sats 93% on RA after activity.     Mobility  Bed Mobility Bed Mobility: Supine to Sit Supine to Sit: 4: Min guard;With rails Sit to Supine: 4: Min assist Details for Bed Mobility Assistance: takes extra time to get up, rest breaks , slow due to dyspnea. Transfers Transfers: Sit to Stand;Stand to Sit Sit to Stand: 3: Mod assist;From bed;From toilet Stand to Sit: 3: Mod assist;To toilet;To bed Details for Transfer Assistance: VCs safety, technique, hand placement. Assist to rise, stabilize. Pt c/o dizziness with standing for 1-2 minutes Ambulation/Gait Ambulation/Gait Assistance: 3: Mod assist Ambulation Distance (Feet): 10 Feet (x2) Assistive device: 1 person hand held assist Ambulation/Gait Assistance Details: Pt is very weak, requires assistance to steady her, held to bed and wall. Gait Pattern: Step-through pattern Gait velocity: decreased    Exercises     PT Diagnosis: Difficulty walking;Generalized weakness  PT Problem List: Decreased strength;Decreased activity tolerance;Decreased  balance;Decreased mobility;Decreased safety awareness;Cardiopulmonary status limiting activity PT Treatment Interventions: DME instruction;Gait training;Functional mobility training;Therapeutic activities;Therapeutic exercise;Patient/family education   PT Goals Acute Rehab PT Goals PT Goal Formulation: With patient Time For Goal Achievement: 11/17/11 Potential to Achieve Goals: Good Pt will go Sit to Stand: with supervision PT Goal: Sit to Stand - Progress: Goal set today Pt will go Stand to Sit: with supervision PT Goal: Stand to Sit - Progress: Goal set today Pt will Ambulate: 51 - 150 feet;with supervision;with least restrictive assistive device PT Goal: Ambulate - Progress: Goal set today  Visit Information  Last PT Received On: 11/03/11 Assistance Needed: +1    Subjective Data  Subjective: I am so weak Patient Stated Goal: I am going home   Prior Functioning  Home Living Lives With: Alone Available Help at Discharge:  (pt does not indicate anyone available) Type of Home: House Home Access: Level entry Home Layout: One level Bathroom Shower/Tub: Engineer, manufacturing systems: Standard Home Adaptive Equipment: None Prior Function Level of Independence: Independent Comments: Pt. was DC'd and  readmitted same day due to fall and inability to care for self. Communication Communication: No difficulties    Cognition  Overall Cognitive Status: Impaired Area of Impairment: Awareness of deficits;Safety/judgement Arousal/Alertness: Awake/alert Orientation Level: Appears intact for tasks assessed    Extremity/Trunk Assessment Right Upper Extremity Assessment RUE ROM/Strength/Tone: Deficits RUE ROM/Strength/Tone Deficits: very weak to perform activity to push from bed. RUE Sensation: WFL - Light Touch Left Upper Extremity Assessment LUE ROM/Strength/Tone: Deficits LUE ROM/Strength/Tone Deficits: same as R LUE Sensation: WFL - Light Touch Right Lower Extremity  Assessment RLE ROM/Strength/Tone: Deficits RLE ROM/Strength/Tone Deficits: just enough strength to walk,  very wobbly standing Left Lower Extremity Assessment LLE ROM/Strength/Tone: Deficits LLE ROM/Strength/Tone Deficits: just enough strength to walk LLE Sensation: WFL - Light Touch   Balance Static Sitting Balance Static Sitting - Balance Support: Bilateral upper extremity supported Static Sitting - Level of Assistance: 5: Stand by assistance Dynamic Sitting Balance Dynamic Sitting - Level of Assistance: 4: Min assist Static Standing Balance Static Standing - Balance Support: Bilateral upper extremity supported Static Standing - Level of Assistance: 4: Min assist Static Standing - Comment/# of Minutes: stood at sink to was hands  End of Session PT - End of Session Activity Tolerance: Patient limited by fatigue Patient left: in bed;with call bell/phone within reach;with bed alarm set Nurse Communication: Mobility status  GP     [name] 11/03/2011, 3:32 PM  [phone]

## 2011-11-03 NOTE — Progress Notes (Signed)
INITIAL ADULT NUTRITION ASSESSMENT Date: 11/03/2011   Time: 11:53 AM Reason for Assessment: Nutrition risk, Low BMI  ASSESSMENT: Female 56 y.o.  Dx: Fall  INTERVENTION: 1. Will order patient Vanilla Ensure BID and change Resource breeze from TID to once daily. Three nutrition supplement daily to provide 750 kcal and 27 grams of protein daily.  2. RD to follow for nutrition plan of care.    Hx:  Past Medical History  Diagnosis Date  . Rheumatoid arthritis   . Bronchitis   . Myocardial infarction   . Pneumonia     Related Meds:  Scheduled Meds:   . sodium chloride   Intravenous Once  . albuterol  2.5 mg Nebulization Q6H  . antiseptic oral rinse  15 mL Mouth Rinse BID  . feeding supplement  1 Container Oral TID BM  . heparin  5,000 Units Subcutaneous Q8H  . nicotine  14 mg Transdermal Daily  . piperacillin-tazobactam (ZOSYN)  IV  3.375 g Intravenous Q8H  . potassium chloride  40 mEq Oral Once  . sodium chloride  500 mL Intravenous Once  . vancomycin  500 mg Intravenous Q24H  . vancomycin  1,000 mg Intravenous Once   Continuous Infusions:  PRN Meds:.sodium chloride, acetaminophen, acetaminophen, albuterol, alum & mag hydroxide-simeth, benzonatate, guaiFENesin-dextromethorphan, HYDROcodone-acetaminophen, ipratropium, ondansetron (ZOFRAN) IV, ondansetron, senna-docusate, sodium chloride, DISCONTD: albuterol, DISCONTD: ipratropium   Ht: 5\' 1"  (154.9 cm)  Wt: 78 lb 4.2 oz (35.5 kg)  Ideal Wt: 47.8 kg  % Ideal Wt: 74% Wt Readings from Last 10 Encounters:  11/03/11 78 lb 4.2 oz (35.5 kg)  11/02/11 78 lb 3.2 oz (35.471 kg)  02/27/08 87 lb (39.463 kg)  01/19/08 90 lb 6.1 oz (40.997 kg)  11/03/07 88 lb (39.917 kg)  Weight during previous admission on 10/29/2011 was 83 lb.. Weight down 5 lb over 1 week.   Body mass index is 14.79 kg/(m^2). (Underweight)  Food/Nutrition Related Hx: Patient appears thin with apparent wasting. Patient reported her appetite and her intake have  been ok. She agreed to drink vanilla ensure nutrition supplement. PO intake documented 50% at meals.   Labs:  CMP     Component Value Date/Time   NA 121* 11/03/2011 1105   K 3.8 11/03/2011 1105   CL 88* 11/03/2011 1105   CO2 21 11/03/2011 1105   GLUCOSE 96 11/03/2011 1105   BUN <3* 11/03/2011 1105   CREATININE 0.33* 11/03/2011 1105   CALCIUM 7.9* 11/03/2011 1105   PROT 5.6* 11/03/2011 0642   ALBUMIN 1.9* 11/03/2011 0642   AST 18 11/03/2011 0642   ALT 10 11/03/2011 0642   ALKPHOS 71 11/03/2011 0642   BILITOT 0.4 11/03/2011 0642   GFRNONAA >90 11/03/2011 1105   GFRAA >90 11/03/2011 1105   No intake or output data in the 24 hours ending 11/03/11 1313   Diet Order: Cardiac  Supplements/Tube Feeding: Resource Breeze TID, provides 750 kcal and 27 grams of protein daily.   IVF:    Estimated Nutritional Needs:   Kcal: 1431-1600 Protein: 57-72 grams  Fluid: 1 ml per kcal intake   NUTRITION DIAGNOSIS: -Inadequate oral intake (NI-2.1).  Status: Ongoing  RELATED TO: unintentional weight loss  AS EVIDENCE BY: pt with 5 lb weight loss over 1 week.   MONITORING/EVALUATION(Goals): PO intake, weights, labs 1. PO intake > 50% at meals and supplements.  2. Minimize weight loss/ promote weight gain.   EDUCATION NEEDS: -No education needs identified at this time  INTERVENTION: 1. Will order patient [employer]  BID and change Resource breeze from TID to once daily. Three nutrition supplement daily to provide 750 kcal and 27 grams of protein daily.  2. RD to follow for nutrition plan of care.   Dietitian [phone]  DOCUMENTATION CODES Per approved criteria  -Severe malnutrition in the context of acute illness or injury -Underweight  *Patient meets malnutrition criteria due to 6% weight loss from baseline over 1 week, apparent loss of both muscle mass and body fat and PO intake likely meeting </= 50% of estimated energy needs for >/= 5 days.   Iven Finn [facility] 11/03/2011, 11:53  AM

## 2011-11-03 NOTE — Progress Notes (Signed)
ANTIBIOTIC CONSULT NOTE - INITIAL  Pharmacy Consult for vancomycin Indication: pneumonia  No Known Allergies  Patient Measurements: Height: 5\' 1"  (154.9 cm) Weight: 78 lb 4.2 oz (35.5 kg) IBW/kg (Calculated) : 47.8  Adjusted Body Weight:   Vital Signs: Temp: 98.2 F (36.8 C) (09/24 0500) Temp src: Oral (09/24 0500) BP: 106/80 mmHg (09/24 0500) Pulse Rate: 101  (09/24 0500) Intake/Output from previous day:   Intake/Output from this shift:    Labs:  Basename 11/02/11 2317 11/01/11 0418  WBC 14.6* 13.3*  HGB 11.0* 9.8*  PLT 367 416*  LABCREA -- --  CREATININE 0.36* 0.38*   Estimated Creatinine Clearance: 45.1 ml/min (by C-G formula based on Cr of 0.36). No results found for this basename: VANCOTROUGH:2,VANCOPEAK:2,VANCORANDOM:2,GENTTROUGH:2,GENTPEAK:2,GENTRANDOM:2,TOBRATROUGH:2,TOBRAPEAK:2,TOBRARND:2,AMIKACINPEAK:2,AMIKACINTROU:2,AMIKACIN:2, in the last 72 hours   Microbiology: Recent Results (from the past 720 hour(s))  CULTURE, BLOOD (ROUTINE X 2)     Status: Normal (Preliminary result)   Collection Time   10/28/11  1:00 PM      Component Value Range Status Comment   Specimen Description BLOOD   Final    Special Requests BOTTLES DRAWN AEROBIC AND ANAEROBIC   Final    Culture  Setup Time 10/29/2011 01:02   Final    Culture     Final    Value:        BLOOD CULTURE RECEIVED NO GROWTH TO DATE CULTURE WILL BE HELD FOR 5 DAYS BEFORE ISSUING A FINAL NEGATIVE REPORT   Report Status PENDING   Incomplete   CULTURE, BLOOD (ROUTINE X 2)     Status: Normal (Preliminary result)   Collection Time   10/28/11  1:20 PM      Component Value Range Status Comment   Specimen Description BLOOD LEFT ARM   Final    Special Requests BOTTLES DRAWN AEROBIC AND ANAEROBIC 2CC   Final    Culture  Setup Time 10/29/2011 01:02   Final    Culture     Final    Value:        BLOOD CULTURE RECEIVED NO GROWTH TO DATE CULTURE WILL BE HELD FOR 5 DAYS BEFORE ISSUING A FINAL NEGATIVE REPORT   Report  Status PENDING   Incomplete   URINE CULTURE     Status: Normal   Collection Time   10/28/11  1:47 PM      Component Value Range Status Comment   Specimen Description URINE, CATHETERIZED   Final    Special Requests NONE   Final    Culture  Setup Time 10/29/2011 01:32   Final    Colony Count NO GROWTH   Final    Culture NO GROWTH   Final    Report Status 10/30/2011 FINAL   Final   MRSA PCR SCREENING     Status: Normal   Collection Time   10/28/11  6:33 PM      Component Value Range Status Comment   MRSA by PCR NEGATIVE  NEGATIVE Final   CULTURE, EXPECTORATED SPUTUM-ASSESSMENT     Status: Normal   Collection Time   10/28/11  8:02 PM      Component Value Range Status Comment   Specimen Description SPUTUM   Final    Special Requests Normal   Final    Sputum evaluation     Final    Value: MICROSCOPIC FINDINGS SUGGEST THAT THIS SPECIMEN IS NOT REPRESENTATIVE OF LOWER RESPIRATORY SECRETIONS. PLEASE RECOLLECT.     Gram Stain Report Called to,Read Back By and Verified With: S PENDALEY  RN [id number] 10/28/11 A NAVARRO   Report Status 10/28/2011 FINAL   Final     Medical History: Past Medical History  Diagnosis Date  . Rheumatoid arthritis   . Bronchitis   . Myocardial infarction   . Pneumonia     Medications:  Anti-infectives     Start     Dose/Rate Route Frequency Ordered Stop   11/04/11 1200   vancomycin (VANCOCIN) 500 mg in sodium chloride 0.9 % 100 mL IVPB        500 mg 100 mL/hr over 60 Minutes Intravenous Every 24 hours 11/03/11 0630     11/03/11 0445   vancomycin (VANCOCIN) IVPB 1000 mg/200 mL premix        1,000 mg 200 mL/hr over 60 Minutes Intravenous  Once 11/03/11 0432     11/03/11 0430   piperacillin-tazobactam (ZOSYN) IVPB 3.375 g        3.375 g 12.5 mL/hr over 240 Minutes Intravenous 3 times per day 11/03/11 0420           Assessment: Patient with PNA. First dose of antibiotics already given.  Patient with very low weight.  Goal of Therapy:  Vancomycin trough  level 15-20 mcg/ml  Plan:  Measure antibiotic drug levels at steady state Follow up culture results Vancomycin 500mg  iv q24hr  Aleene Davidson Crowford 11/03/2011,6:31 AM

## 2011-11-04 DIAGNOSIS — J189 Pneumonia, unspecified organism: Secondary | ICD-10-CM

## 2011-11-04 DIAGNOSIS — E876 Hypokalemia: Secondary | ICD-10-CM

## 2011-11-04 DIAGNOSIS — R627 Adult failure to thrive: Secondary | ICD-10-CM

## 2011-11-04 DIAGNOSIS — R5381 Other malaise: Secondary | ICD-10-CM

## 2011-11-04 DIAGNOSIS — J449 Chronic obstructive pulmonary disease, unspecified: Secondary | ICD-10-CM

## 2011-11-04 DIAGNOSIS — D72829 Elevated white blood cell count, unspecified: Secondary | ICD-10-CM

## 2011-11-04 LAB — BASIC METABOLIC PANEL
BUN: 3 mg/dL — ABNORMAL LOW (ref 6–23)
BUN: <3 mg/dL — ABNORMAL LOW (ref 6–23)
BUN: <3 mg/dL — ABNORMAL LOW (ref 6–23)
BUN: 3 mg/dL — ABNORMAL LOW (ref 6–23)
CO2: 24 mEq/L (ref 19–32)
Calcium: 8 mg/dL — ABNORMAL LOW (ref 8.4–10.5)
Calcium: 8.2 mg/dL — ABNORMAL LOW (ref 8.4–10.5)
Calcium: 8.1 mg/dL — ABNORMAL LOW (ref 8.4–10.5)
Calcium: 7.9 mg/dL — ABNORMAL LOW (ref 8.4–10.5)
Calcium: 8.1 mg/dL — ABNORMAL LOW (ref 8.4–10.5)
Creatinine, Ser: 0.37 mg/dL — ABNORMAL LOW (ref 0.50–1.10)
Creatinine, Ser: 0.37 mg/dL — ABNORMAL LOW (ref 0.50–1.10)
Creatinine, Ser: 0.35 mg/dL — ABNORMAL LOW (ref 0.50–1.10)
GFR calc Af Amer: >90 mL/min (ref >90)
GFR calc Af Amer: >90 mL/min (ref >90)
GFR calc Af Amer: >90 mL/min (ref >90)
GFR calc Af Amer: >90 mL/min (ref >90)
GFR calc non Af Amer: >90 mL/min (ref >90)
GFR calc non Af Amer: >90 mL/min (ref >90)
GFR calc non Af Amer: >90 mL/min (ref >90)
GFR calc non Af Amer: >90 mL/min (ref >90)
Glucose, Bld: 79 mg/dL (ref 70–99)
Glucose, Bld: 84 mg/dL (ref 70–99)
Glucose, Bld: 108 mg/dL — ABNORMAL HIGH (ref 70–99)
Potassium: 3.1 mEq/L — ABNORMAL LOW (ref 3.5–5.1)
Potassium: 3.2 mEq/L — ABNORMAL LOW (ref 3.5–5.1)
Sodium: 124 mEq/L — ABNORMAL LOW (ref 135–145)
Sodium: 120 mEq/L — ABNORMAL LOW (ref 135–145)
Sodium: 120 mEq/L — ABNORMAL LOW (ref 135–145)

## 2011-11-04 LAB — LEGIONELLA ANTIGEN, URINE: Legionella Antigen, Urine: NEGATIVE

## 2011-11-04 LAB — CULTURE, BLOOD (ROUTINE X 2)
Culture: NO GROWTH
Culture: NO GROWTH

## 2011-11-04 MED ORDER — VITAMINS A & D EX OINT
TOPICAL_OINTMENT | CUTANEOUS | Status: AC
  Administered 2011-11-04: 01:00:00
  Filled 2011-11-04: qty 10

## 2011-11-04 MED ORDER — SODIUM CHLORIDE 0.9 % IV SOLN: 250.0000 mL | INTRAVENOUS | Status: DC | PRN

## 2011-11-04 MED ORDER — ENSURE COMPLETE PO LIQD: 237.0000 mL | Freq: Three times a day (TID) | ORAL | Status: DC

## 2011-11-04 MED ORDER — MIRTAZAPINE 7.5 MG PO TABS
7.5000 mg | ORAL_TABLET | Freq: Every day | ORAL | Status: DC
  Administered 2011-11-04 – 2011-11-12 (×9): 7.5 mg via ORAL
  Filled 2011-11-04 (×10): qty 1

## 2011-11-04 MED ORDER — ALBUTEROL SULFATE (5 MG/ML) 0.5% IN NEBU
2.5000 mg | INHALATION_SOLUTION | Freq: Three times a day (TID) | RESPIRATORY_TRACT | Status: DC
  Administered 2011-11-04 – 2011-11-09 (×16): 2.5 mg via RESPIRATORY_TRACT
  Filled 2011-11-04 (×16): qty 0.5

## 2011-11-04 NOTE — Consult Note (Signed)
Jennifer Hurley 11/04/2011 Jennifer Hurley D Requesting Physician:  Dr. Gwenlyn Perking  Reason for Consult:  Hyponatremia HPI: The patient is a 56 y.o. year-old with hx of cocaine abuse, COPD, malnutrition who was admitted here from 9/18-9/23 with a large RUL pneumonia. Readmitted 9/24 due to weakness and falling with low serum Na of 120.  She has been drinking a lot of water. Not thirsty today though.    Chart reviews shows ECHO with EF 55%, TSH and cortisol ordered and pending. Creatinine is normal. FeNa is low at 0.16%, urine osm is low at 163.  Outpatient meds show demeclocycline, which I believe was started on last admission for low Na.   ROS  no HA, sore throat  no f/c/s  no n/v/d, no abd pain  no confusion or hallucinations  no sob or cp    Past Medical History:  Past Medical History  Diagnosis Date  . Rheumatoid arthritis   . Bronchitis   . Myocardial infarction   . Pneumonia     Past Surgical History:  Past Surgical History  Procedure Date  . Tonsillectomy   . Tubal ligation     Family History:  Family History  Problem Relation Age of Onset  . Coronary artery disease    . Diabetes type II     Social History:  reports that she has been smoking.  She has never used smokeless tobacco. She reports that she drinks alcohol. She reports that she uses illicit drugs (Cocaine and Marijuana).  Allergies: No Known Allergies  Home medications: Prior to Admission medications   Medication Sig Start Date End Date Taking? Authorizing Provider  albuterol (PROVENTIL) (5 MG/ML) 0.5% nebulizer solution Take 0.5 mLs (2.5 mg total) by nebulization every 6 (six) hours as needed for wheezing or shortness of breath. 11/02/11  Yes Alison Murray, MD  demeclocycline (DECLOMYCIN) 150 MG tablet Take 1 tablet (150 mg total) by mouth every 12 (twelve) hours. 11/02/11  Yes Alison Murray, MD  feeding supplement (RESOURCE BREEZE) LIQD Take 1 Container by mouth 3 (three) times daily between meals. 11/02/11   Yes Alison Murray, MD  Fluticasone-Salmeterol (ADVAIR DISKUS) 250-50 MCG/DOSE AEPB Inhale 1 puff into the lungs 2 (two) times daily. 11/02/11  Yes Alison Murray, MD  guaiFENesin-dextromethorphan (ROBITUSSIN DM) 100-10 MG/5ML syrup Take 5 mLs by mouth every 4 (four) hours as needed for cough. 11/02/11  Yes Alison Murray, MD  HYDROcodone-acetaminophen (NORCO/VICODIN) 5-325 MG per tablet Take 1-2 tablets by mouth every 4 (four) hours as needed for pain. 11/02/11  Yes Alison Murray, MD  ibuprofen (ADVIL,MOTRIN) 200 MG tablet Take 200 mg by mouth every 6 (six) hours as needed. Pain   Yes Historical Provider, MD  levofloxacin (LEVAQUIN) 500 MG tablet Take 1 tablet (500 mg total) by mouth daily. 11/02/11  Yes Alison Murray, MD  tiotropium (SPIRIVA) 18 MCG inhalation capsule Place 1 capsule (18 mcg total) into inhaler and inhale daily. 11/02/11  Yes Alison Murray, MD    Inpatient medications:    . albuterol  2.5 mg Nebulization TID  . antiseptic oral rinse  15 mL Mouth Rinse BID  . feeding supplement  237 mL Oral TID BM  . feeding supplement  1 Container Oral QAC breakfast  . heparin  5,000 Units Subcutaneous Q8H  . mirtazapine  7.5 mg Oral QHS  . nicotine  14 mg Transdermal Daily  . piperacillin-tazobactam (ZOSYN)  IV  3.375 g Intravenous Q8H  . sodium chloride  1 g Oral Once  . vancomycin  500 mg Intravenous Q24H  . vitamin A & D      . DISCONTD: albuterol  2.5 mg Nebulization Q6H  . DISCONTD: albuterol  2.5 mg Nebulization QID  . DISCONTD: feeding supplement  237 mL Oral BID BM    Labs: Basic Metabolic Panel:  Lab 11/04/11 1610 11/04/11 1053 11/04/11 0704 11/04/11 0300 11/03/11 2317 11/03/11 1922 11/03/11 1450 10/30/11 0320 10/28/11 2046  NA 120* 124* 124* 126* 124* 123* 123* -- --  K 3.1* 3.2* 3.5 3.5 3.5 3.5 3.6 -- --  CL 86* 89* 89* 90* 89* 89* 88* -- --  CO2 23 24 26 25 25 24 26  -- --  GLUCOSE 84 109* 82 79 84 97 123* -- --  BUN <3* <3* <3* 3* 3* 4* 3* -- --  CREATININE 0.33* 0.35*  0.37* 0.37* 0.36* 0.35* 0.38* -- --  ALB -- -- -- -- -- -- -- -- --  CALCIUM 7.9* 8.1* 8.2* 8.0* 7.9* 8.1* 8.1* -- --  PHOS -- -- -- -- -- -- -- 1.8* 3.4   Liver Function Tests:  Lab 11/03/11 0642 10/29/11 0320  AST 18 61*  ALT 10 17  ALKPHOS 71 90  BILITOT 0.4 0.4  PROT 5.6* 6.3  ALBUMIN 1.9* 2.1*   No results found for this basename: LIPASE:3,AMYLASE:3 in the last 168 hours No results found for this basename: AMMONIA:3 in the last 168 hours CBC:  Lab 11/03/11 0642 11/02/11 2317 11/01/11 0418 10/31/11 0534  WBC 12.5* 14.6* 13.3* 7.9  NEUTROABS -- -- -- --  HGB 9.8* 11.0* 9.8* 9.5*  HCT 27.0* 30.0* 27.0* 26.3*  MCV 75.8* 75.9* 77.8* 76.9*  PLT 336 367 416* 455*   PT/INR: @labrcntip (inr:5) Cardiac Enzymes: No results found for this basename: CKTOTAL:5,CKMB:5,CKMBINDEX:5,TROPONINI:5 in the last 168 hours CBG:  Lab 11/02/11 0746 11/01/11 0835 11/01/11 0740 10/30/11 0742 10/29/11 0751  GLUCAP 106* 79 53* 173* 84    Iron Studies: No results found for this basename: IRON:30,TIBC:30,TRANSFERRIN:30,FERRITIN:30 in the last 168 hours  Xrays/Other Studies: Dg Chest 2 View  11/03/2011  *RADIOLOGY REPORT*  Clinical Data: Cough.  Chest pain.  CHEST - 2 VIEW  Comparison: Radiograph 11/01/2011.  Findings: Emphysema and chronic lung disease is present with right upper lobe pneumonia.  Increasing cavitation in the right upper lobe is present compatible with necrotizing pneumonia.  This was better demonstrated on recent chest CT 10/28/2011.  Blunting of the costophrenic angles.  Cardiopericardial silhouette appears within normal limits.  No new airspace disease.  IMPRESSION:  1. Decreasing area of right upper lobe pneumonia with peripheral cavitation compatible with necrotizing pneumonia. 2.  Emphysema and chronic lung disease. 3.  Small bilateral pleural effusions.   Original Report Authenticated By: Andreas Newport, M.D.     Physical Exam:  Blood pressure 103/72, pulse 85, temperature 98.3  F (36.8 C), temperature source Oral, resp. rate 16, height 5\' 1"  (1.549 m), weight 35.5 kg (78 lb 4.2 oz), SpO2 95.00%.  Gen: frail adult female, very thin, barrel chest, coughing frequently Skin: no rash, cyanosis HEENT:  EOMI, sclera anicteric, throat clear Neck: no JVD, no bruits or LAN Chest: dec'd air movement throughout, no wheezing, crackles L base Heart: regular, no rub or gallop, 2/6 SEM at LLSB Abdomen: soft, scaphoid, nontender, no mass or ascites, no HM Ext: no LE or UE edema, no ulcer or gangrene Neuro: alert, Ox3, no focal deficit   Impression/Plan 1. Hyponatremia- urine indices suggest she is Na+  depleted (low FeNa) and water overloaded (low serum Na) with appropriate ADH action (low Uosm). Will treat as hypovolemic hyponatremia with normal saline and fluid restriction for water overload.  2. PNA 3. COPD 4. Cocaine abuse 5. Malnutrition/low albumin  Will follow.   Vinson Moselle  MD [employer] [phone] pgr    [phone] cell 11/04/2011, 5:03 PM

## 2011-11-04 NOTE — Progress Notes (Addendum)
[facility] PROGRESS NOTE  Jennifer Hurley NWG:[id number] DOB: 1955-10-24 DOA: 11/02/2011 PCP: No primary provider on file.  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 Will ask nephrology to evaluate and provide rec;s. -Will check cortisol; osmolality is low. -Random urine Sodium 36  Fall/debility  multifactorial, related to patient's hyponatremia and recent infection. Patient was weak at discharge however she refused physical therapy.  -PT/OT re consulted this time -Patient willing to participate  .CAP (community acquired pneumonia) and .Leukocytosis  -Improved but with evidence of necrotizing pneumonia on CXR -Antibiotic changed to vanc and zosyn for better coverage while in the hospital.  .Hypokalemia  Will replete  .COPD  Nebulizers ordered  During my interview patient denies tobacco, alcohol or any illicit drugs. Previous H&P doesn't reflect this. Will place patient on nicotine patch.  Continue to monitor.  Poor appetite with severe protein calorie malnutrition: -started on remeron -continue feeding supplements.   Code Status: Full Family Communication: no family at bedside Disposition Plan: Patient will like to go back home when medically stable.   Brief narrative: 56 year old female who was just discharged from [facility] today after being treated for pneumonia. Patient states at home she was weak and fell in the bathroom. She states she did not hit her head and she did not lose consciousness. She came back to the ER. Patient does not report any worsening symptoms, no fevers, no shortness of breath, no wheezing. The patient lives alone. Labs in the ER revealed a worsened hyponatremia with a sodium of 120, with a slightly increased white blood cell count, the hospitalist was requested to admit  patient.   Consultants: Nephrology  Antibiotics:  vanc and zosyn  HPI/Subjective: Afebrile. Complaining of cough. Patient reports decreased appetite. She is generally weak and at this moment refusing to participate with physical therapy secondary to generalized weakness.  Objective: Filed Vitals:   11/04/11 0442 11/04/11 0624 11/04/11 0813 11/04/11 1402  BP:  98/67  103/72  Pulse:  107  85  Temp:  98.2 F (36.8 C)  98.3 F (36.8 C)  TempSrc:  Oral    Resp:  18  16  Height:      Weight:      SpO2: 94% 97% 92% 95%    Intake/Output Summary (Last 24 hours) at 11/04/11 1558 Last data filed at 11/04/11 1500  Gross per 24 hour  Intake    940 ml  Output   1150 ml  Net   -210 ml   Filed Weights   11/03/11 0455 11/03/11 0629  Weight: 35.29 kg (77 lb 12.8 oz) 35.5 kg (78 lb 4.2 oz)    Exam:   General:  No acute distress, still coughing, denies significant shortness of breath.  Cardiovascular: Mild tachycardia, no rubs or gallops. S1 and S2 appreciated  Respiratory: Good air movement, no wheezing or crackles; positive diffuse rhonchi  Abdomen: Soft, nontender, nondistended, positive bowel sounds  Extremities: No edema, no cyanosis or clubbing.  Neuro: Alert, awake and oriented x3, no focal deficit appreciated. Patient with generalized weakness.  Data Reviewed: Basic Metabolic Panel:  Lab 11/04/11 5784 11/04/11 1053 11/04/11 0704 11/04/11 0300 11/03/11 2317 10/31/11 0534 10/30/11 0320 10/28/11 2046  NA 120* 124* 124* 126* 124* -- -- --  K 3.1* 3.2* 3.5 3.5 3.5 -- -- --  CL 86* 89* 89* 90* 89* -- -- --  CO2 23 24 26  25  25 -- -- --  GLUCOSE 84 109* 82 79 84 -- -- --  BUN <3* <3* <3* 3* 3* -- -- --  CREATININE 0.33* 0.35* 0.37* 0.37* 0.36* -- -- --  CALCIUM 7.9* 8.1* 8.2* 8.0* 7.9* -- -- --  MG -- -- -- -- -- 2.2 1.1* 1.7  PHOS -- -- -- -- -- -- 1.8* 3.4   Liver Function Tests:  Lab 11/03/11 0642 10/29/11 0320  AST 18 61*  ALT 10 17  ALKPHOS 71 90  BILITOT  0.4 0.4  PROT 5.6* 6.3  ALBUMIN 1.9* 2.1*   CBC:  Lab 11/03/11 0642 11/02/11 2317 11/01/11 0418 10/31/11 0534 10/30/11 0320  WBC 12.5* 14.6* 13.3* 7.9 10.1  NEUTROABS -- -- -- -- --  HGB 9.8* 11.0* 9.8* 9.5* 10.6*  HCT 27.0* 30.0* 27.0* 26.3* 28.2*  MCV 75.8* 75.9* 77.8* 76.9* 76.8*  PLT 336 367 416* 455* 408*   BNP (last 3 results)  Basename 10/28/11 2052  PROBNP 133.2*   CBG:  Lab 11/02/11 0746 11/01/11 0835 11/01/11 0740 10/30/11 0742 10/29/11 0751  GLUCAP 106* 79 53* 173* 84    Recent Results (from the past 240 hour(s))  CULTURE, BLOOD (ROUTINE X 2)     Status: Normal   Collection Time   10/28/11  1:00 PM      Component Value Range Status Comment   Specimen Description BLOOD   Final    Special Requests BOTTLES DRAWN AEROBIC AND ANAEROBIC   Final    Culture  Setup Time 10/29/2011 01:02   Final    Culture NO GROWTH 5 DAYS   Final    Report Status 11/04/2011 FINAL   Final   CULTURE, BLOOD (ROUTINE X 2)     Status: Normal   Collection Time   10/28/11  1:20 PM      Component Value Range Status Comment   Specimen Description BLOOD LEFT ARM   Final    Special Requests BOTTLES DRAWN AEROBIC AND ANAEROBIC 2CC   Final    Culture  Setup Time 10/29/2011 01:02   Final    Culture NO GROWTH 5 DAYS   Final    Report Status 11/04/2011 FINAL   Final   URINE CULTURE     Status: Normal   Collection Time   10/28/11  1:47 PM      Component Value Range Status Comment   Specimen Description URINE, CATHETERIZED   Final    Special Requests NONE   Final    Culture  Setup Time 10/29/2011 01:32   Final    Colony Count NO GROWTH   Final    Culture NO GROWTH   Final    Report Status 10/30/2011 FINAL   Final   MRSA PCR SCREENING     Status: Normal   Collection Time   10/28/11  6:33 PM      Component Value Range Status Comment   MRSA by PCR NEGATIVE  NEGATIVE Final   CULTURE, EXPECTORATED SPUTUM-ASSESSMENT     Status: Normal   Collection Time   10/28/11  8:02 PM      Component Value Range  Status Comment   Specimen Description SPUTUM   Final    Special Requests Normal   Final    Sputum evaluation     Final    Value: MICROSCOPIC FINDINGS SUGGEST THAT THIS SPECIMEN IS NOT REPRESENTATIVE OF LOWER RESPIRATORY SECRETIONS. PLEASE RECOLLECT.     Gram Stain Report Called to,Read Back By and Verified With: S  PENDALEY RN [id number] 10/28/11 A NAVARRO   Report Status 10/28/2011 FINAL   Final      Studies: Dg Chest 2 View  11/03/2011  *RADIOLOGY REPORT*  Clinical Data: Cough.  Chest pain.  CHEST - 2 VIEW  Comparison: Radiograph 11/01/2011.  Findings: Emphysema and chronic lung disease is present with right upper lobe pneumonia.  Increasing cavitation in the right upper lobe is present compatible with necrotizing pneumonia.  This was better demonstrated on recent chest CT 10/28/2011.  Blunting of the costophrenic angles.  Cardiopericardial silhouette appears within normal limits.  No new airspace disease.  IMPRESSION:  1. Decreasing area of right upper lobe pneumonia with peripheral cavitation compatible with necrotizing pneumonia. 2.  Emphysema and chronic lung disease. 3.  Small bilateral pleural effusions.   Original Report Authenticated By: Andreas Newport, M.D.     Scheduled Meds:   . albuterol  2.5 mg Nebulization TID  . antiseptic oral rinse  15 mL Mouth Rinse BID  . feeding supplement  237 mL Oral BID BM  . feeding supplement  1 Container Oral QAC breakfast  . heparin  5,000 Units Subcutaneous Q8H  . nicotine  14 mg Transdermal Daily  . piperacillin-tazobactam (ZOSYN)  IV  3.375 g Intravenous Q8H  . sodium chloride  1 g Oral Once  . vancomycin  500 mg Intravenous Q24H  . vitamin A & D      . DISCONTD: albuterol  2.5 mg Nebulization Q6H  . DISCONTD: albuterol  2.5 mg Nebulization QID    Time spent: > 30 minutes    Amparo Donalson  [facility] Pager [phone]. If 8PM-8AM, please contact night-coverage at [email or web address], password [facility] 11/04/2011, 3:58 PM  LOS: 2 days

## 2011-11-04 NOTE — Clinical Documentation Improvement (Signed)
MALNUTRITION DOCUMENTATION CLARIFICATION  THIS DOCUMENT IS NOT A PERMANENT PART OF THE MEDICAL RECORD  TO RESPOND TO THE THIS QUERY, FOLLOW THE INSTRUCTIONS BELOW:  1. If needed, update documentation for the patient's encounter via the notes activity.  2. Access this query again and click edit on the In [employer].  3. After updating, or not, click F2 to complete all highlighted (required) fields concerning your review. Select "additional documentation in the medical record" OR "no additional documentation provided".  4. Click Sign note button.  5. The deficiency will fall out of your In Basket *Please let us know if you are not able to complete this workflow by phone or e-mail (listed below).  Please update your documentation within the medical record to reflect your response to this query.                                                                                        11/04/11   Dear [facility],  In a better effort to capture your patient's severity of illness, reflect appropriate length of stay and utilization of resources, a review of the patient medical record has revealed the following indicators.    Based on your clinical judgment, please clarify and document in a progress note and/or discharge summary the clinical condition associated with the following supporting information:  In responding to this query please exercise your independent judgment.  The fact that a query is asked, does not imply that any particular answer is desired or expected.  11/03/11 Nutr Eval noted w/ nutrition Dx "-Severe malnutrition in the context of acute illness or injury  -Underweight." For accurate Dx specificity & severity please help validate nutrition dx for clinical cond being eval'd, mon'd & tx'd. Thank you  Possible Clinical Conditions? . Mild Malnutrition  . Moderate Malnutrition . Severe Malnutrition   . Protein Calorie Malnutrition . Severe Protein Calorie  Malnutrition  . Emaciation  . Cachexia   Other Condition (please specify) Cannot Clinically Determine  Supporting Information: Risk Factors: See Nutrition Eval 11/03/11  Signs & Symptoms: See Nutrition Eval 11/03/11  -Diagnostics: See Nutrition Eval 11/03/11  Treatments: See Nutrition Eval 11/03/11  -Medications:  -Nutrition Consult: See Nutrition Eval 11/03/11  You may use possible, probable, or suspect with inpatient documentation. possible, probable, suspected diagnoses MUST be documented at the time of discharge  Reviewed:  no additional documentation provided  Thank You,  Toribio Harbour, RN, BSN, CCDS Certified Clinical Documentation Specialist Pager: [phone]  Health Information Management [place]  I did not evaluate patient.  Please refer to chart and assign accordingly.

## 2011-11-04 NOTE — Progress Notes (Signed)
Talked to the patient about the importance of working with physical therapy; patient stated "I don't feel well". CM asked patient what was bothering her, patient stated " I just don't feel like eating anything"; Lots of encouragement given, encouraged patient to work with PT to build up her strength. CM reminded patient that last admission( a few days ago, she did not want to work with PT and went home and fell in the bathroom). Patient refuse SNF placement and wants to return home at discharge. CM informed the patient that if she is not walking, we cannot discharge her home alone for safety reasons. Patient agreed to work with PT tomorrow. B Glendy Barsanti RN,BSN,MHA.

## 2011-11-04 NOTE — Progress Notes (Signed)
Physical Therapy Treatment Patient Details Name: Jennifer Hurley MRN: [id number] DOB: [birth year] Today's Date: 11/04/2011 Time: 4098-1191 PT Time Calculation (min): 15 min  PT Assessment / Plan / Recommendation Comments on Treatment Session  Pt. did not ambulate today stating she felt dizzy and did not want to walk. agreed for in AM. Pt. again declines SNF but does not appear to be able to care for self.    Follow Up Recommendations  Skilled nursing facility    Barriers to Discharge        Equipment Recommendations  Rolling walker with 5" wheels;3 in 1 bedside comode    Recommendations for Other Services OT consult  Frequency Min 3X/week   Plan Discharge plan remains appropriate;Frequency remains appropriate    Precautions / Restrictions Precautions Precautions: Fall Precaution Comments: incontinence   Pertinent Vitals/Pain     Mobility  Bed Mobility Supine to Sit: 5: Supervision Sit to Supine: 5: Supervision Transfers Transfers: Not assessed Details for Transfer Assistance: Pt. declined to  proceed with ambulation and got herself back to bed even with much encourage ment    Exercises     PT Diagnosis:    PT Problem List:   PT Treatment Interventions:     PT Goals Acute Rehab PT Goals Pt will go Sit to Stand: with supervision PT Goal: Sit to Stand - Progress: Not progressing Pt will go Stand to Sit: with supervision PT Goal: Stand to Sit - Progress: Not progressing PT Goal: Ambulate - Progress: Not progressing  Visit Information  Last PT Received On: 11/04/11 Assistance Needed: +2    Subjective Data  Subjective: I don't feel like getting up. I am dizzy and I have not eaten   Cognition  Overall Cognitive Status: Impaired Area of Impairment: Safety/judgement;Awareness of deficits;Problem solving;Executive functioning Arousal/Alertness: Awake/alert Orientation Level: Appears intact for tasks assessed Behavior During Session: Anxious Safety/Judgement:  Decreased awareness of safety precautions    Balance     End of Session PT - End of Session Activity Tolerance: Patient limited by fatigue (Limited by pt. lackof participation) Patient left: in bed;with call bell/phone within reach Nurse Communication: Mobility status   GP     [name] 11/04/2011, 3:50 PM [phone]

## 2011-11-05 LAB — BASIC METABOLIC PANEL
BUN: <3 mg/dL — ABNORMAL LOW (ref 6–23)
Calcium: 8.4 mg/dL (ref 8.4–10.5)
Creatinine, Ser: 0.37 mg/dL — ABNORMAL LOW (ref 0.50–1.10)
Creatinine, Ser: 0.44 mg/dL — ABNORMAL LOW (ref 0.50–1.10)
GFR calc Af Amer: >90 mL/min (ref >90)
GFR calc Af Amer: >90 mL/min (ref >90)
GFR calc non Af Amer: >90 mL/min (ref >90)
GFR calc non Af Amer: >90 mL/min (ref >90)
Sodium: 125 mEq/L — ABNORMAL LOW (ref 135–145)

## 2011-11-05 MED ORDER — ENSURE COMPLETE PO LIQD
237.0000 mL | Freq: Two times a day (BID) | ORAL | Status: DC
  Administered 2011-11-06 – 2011-11-10 (×5): 237 mL via ORAL

## 2011-11-05 MED ORDER — ENSURE PUDDING PO PUDG
1.0000 | Freq: Three times a day (TID) | ORAL | Status: DC
  Administered 2011-11-06 – 2011-11-10 (×5): 1 via ORAL
  Filled 2011-11-05 (×16): qty 1

## 2011-11-05 MED ORDER — HYDROCODONE-HOMATROPINE 5-1.5 MG/5ML PO SYRP
5.0000 mL | ORAL_SOLUTION | Freq: Four times a day (QID) | ORAL | Status: DC | PRN
  Administered 2011-11-05 – 2011-11-09 (×4): 5 mL via ORAL
  Filled 2011-11-05 (×4): qty 5

## 2011-11-05 MED ORDER — SODIUM CHLORIDE 0.9 % IV SOLN: 250.0000 mL | INTRAVENOUS | Status: DC | PRN

## 2011-11-05 MED ORDER — SODIUM CHLORIDE 1 G PO TABS
2.0000 g | ORAL_TABLET | Freq: Two times a day (BID) | ORAL | Status: DC
  Administered 2011-11-05 – 2011-11-11 (×12): 2 g via ORAL
  Filled 2011-11-05 (×17): qty 2

## 2011-11-05 NOTE — Progress Notes (Signed)
Subjective: Coughing up phlegm, no complaints, no SOB. Na up to 125 today  Objective Vital signs in last 24 hours: Filed Vitals:   11/04/11 2110 11/05/11 0452 11/05/11 0911 11/05/11 1450  BP: 92/61 96/70    Pulse: 96 115    Temp: 98.2 F (36.8 C) 98.9 F (37.2 C)    TempSrc: Oral     Resp: 16 16    Height:      Weight:      SpO2: 100% 100% 96% 96%   Weight change:   Intake/Output Summary (Last 24 hours) at 11/05/11 1455 Last data filed at 11/05/11 1300  Gross per 24 hour  Intake   1060 ml  Output   2040 ml  Net   -980 ml   Labs: Basic Metabolic Panel:  Lab 11/05/11 4540 11/05/11 0438 11/04/11 2041 11/04/11 1458 11/04/11 1053 11/04/11 0704 11/04/11 0300 10/30/11 0320  NA 125* 125* 120* 120* 124* 124* 126* --  K 3.6 3.6 3.2* 3.1* 3.2* 3.5 3.5 --  CL 89* 89* 86* 86* 89* 89* 90* --  CO2 26 23 23 23 24 26 25  --  GLUCOSE 103* 106* 108* 84 109* 82 79 --  BUN 4* <3* 3* <3* <3* <3* 3* --  CREATININE 0.44* 0.37* 0.34* 0.33* 0.35* 0.37* 0.37* --  ALB -- -- -- -- -- -- -- --  CALCIUM 8.4 8.6 8.1* 7.9* 8.1* 8.2* 8.0* --  PHOS -- -- -- -- -- -- -- 1.8*   Liver Function Tests:  Lab 11/03/11 0642  AST 18  ALT 10  ALKPHOS 71  BILITOT 0.4  PROT 5.6*  ALBUMIN 1.9*   No results found for this basename: LIPASE:3,AMYLASE:3 in the last 168 hours No results found for this basename: AMMONIA:3 in the last 168 hours CBC:  Lab 11/03/11 0642 11/02/11 2317 11/01/11 0418 10/31/11 0534  WBC 12.5* 14.6* 13.3* 7.9  NEUTROABS -- -- -- --  HGB 9.8* 11.0* 9.8* 9.5*  HCT 27.0* 30.0* 27.0* 26.3*  MCV 75.8* 75.9* 77.8* 76.9*  PLT 336 367 416* 455*   PT/INR: @labrcntip (inr:5) Cardiac Enzymes: No results found for this basename: CKTOTAL:5,CKMB:5,CKMBINDEX:5,TROPONINI:5 in the last 168 hours CBG:  Lab 11/02/11 0746 11/01/11 0835 11/01/11 0740 10/30/11 0742  GLUCAP 106* 79 53* 173*    Iron Studies: No results found for this basename: IRON:30,TIBC:30,TRANSFERRIN:30,FERRITIN:30 in the last  168 hours  Physical Exam:  Blood pressure 96/70, pulse 115, temperature 98.9 F (37.2 C), temperature source Oral, resp. rate 16, height 5\' 1"  (1.549 m), weight 35.5 kg (78 lb 4.2 oz), SpO2 96.00%.  Gen: frail adult female, very thin, barrel chest, coughing, alert in no distress  Skin: no rash, cyanosis  HEENT: EOMI, sclera anicteric, throat clear  Neck: no JVD, no bruits or LAN  Chest: dec'd air movement throughout, no wheezing, crackles L base  Heart: regular, no rub or gallop, 2/6 SEM at LLSB  Abdomen: soft, scaphoid, nontender, no mass or ascites, no HM  Ext: no LE or UE edema, no ulcer or gangrene  Neuro: alert, Ox3, no focal deficit   Impression/Plan  1. Hyponatremia- urine indices suggest she is Na+ depleted (low FeNa) and water overloaded (low serum Na) with appropriate ADH action (low Uosm). Treating as hypovolemic hyponatremia with normal saline and fluid restriction for water overload. Na better today. Poor po intake in general  and suspect low salt intake.  Will add salt tabs and see if this helps.  2. PNA 3. COPD 4. Cocaine abuse 5. Malnutrition/low  albumin/cachexia  Vinson Moselle  MD [facility] [phone] pgr    [phone] cell 11/05/2011, 2:55 PM

## 2011-11-05 NOTE — Progress Notes (Signed)
Physical Therapy Treatment Patient Details Name: Jennifer Hurley MRN: [id number] DOB: [birth year] Today's Date: 11/05/2011 Time: 1400-1420 PT Time Calculation (min): 20 min  PT Assessment / Plan / Recommendation Comments on Treatment Session  Pt. eventually ambulated today w/ RW. Pt requests RW and can benefit from [facility] w. urgency to urinate/safety. pt. will benefit  from HHPT safety eval.    Follow Up Recommendations  Home health PT    Barriers to Discharge        Equipment Recommendations  Rolling walker with 5" wheels;3 in 1 bedside comode    Recommendations for Other Services    Frequency Min 3X/week   Plan Discharge plan needs to be updated;Frequency remains appropriate    Precautions / Restrictions Precautions Precautions: Fall   Pertinent Vitals/Pain No c/o  Except being cold.    Mobility  Bed Mobility Supine to Sit: 7: Independent Sit to Supine: 7: Independent Transfers Transfers: Stand Pivot Transfers Sit to Stand: 5: Supervision;From bed;From chair/3-in-1 Stand to Sit: To bed;To chair/3-in-1 Stand Pivot Transfers: 5: Supervision Details for Transfer Assistance: Pt did particiapate in therapy. pt. incontinent trying to get to [facility]. Ambulation/Gait Ambulation/Gait Assistance: 5: Supervision Ambulation Distance (Feet): 90 Feet Assistive device: Rolling walker Ambulation/Gait Assistance Details: pt is much stronger, pt self limiting. Gait Pattern: Step-through pattern    Exercises     PT Diagnosis:    PT Problem List:   PT Treatment Interventions:     PT Goals Acute Rehab PT Goals Pt will go Sit to Stand: Independently PT Goal: Sit to Stand - Progress: Updated due to goal met Pt will go Stand to Sit: with modified independence PT Goal: Stand to Sit - Progress: Updated due to goals met Pt will Ambulate: 51 - 150 feet;with supervision;with least restrictive assistive device PT Goal: Ambulate - Progress: Progressing toward goal  Visit Information  Last PT  Received On: 11/05/11 Assistance Needed: +1    Subjective Data  Subjective: I am cold   Cognition  Overall Cognitive Status: Appears within functional limits for tasks assessed/performed Arousal/Alertness: Awake/alert Behavior During Session: Restless Safety/Judgement - Other Comments: appears much improved for processing info, pt is eventually cooperative and appreciative.    Balance     End of Session PT - End of Session Activity Tolerance: Patient tolerated treatment well Patient left: in bed;with call bell/phone within reach;with bed alarm set Nurse Communication: Mobility status   GP     [name] 11/05/2011, 2:35 PM

## 2011-11-05 NOTE — Progress Notes (Signed)
[facility] PROGRESS NOTE  ZELTA ENFIELD WUJ:[id number] DOB: Sep 22, 1955 DOA: 11/02/2011 PCP: No primary provider on file.  Assessment/Plan: Hyponatremia/most likely 2/2 to hypovolemic hyponatremia with water overload -At this moment following renal service recommendations; will start sodium tablets, continue fluid restriction at 1L daily and continue saline infusion at 125 cc/hr. -cortisol and TSH WNL. -Sodium 125 today -Patient feeling better  Fall/debility  multifactorial, related to patient's hyponatremia and recent infection. Patient was weak at discharge however she refused physical therapy.  -PT/OT re consulted this time; recommendations are for HHPt   .CAP (community acquired pneumonia) and .Leukocytosis  -Improved but with evidence of necrotizing pneumonia on CXR -Antibiotic changed to vanc and zosyn for better coverage while in the hospital. -CBC to be follow in am -will also add hycodan to control her cough.  .Hypokalemia  repleted  .COPD  Nebulizers ordered  Patient denies tobacco, alcohol or any illicit drugs. Previous H&P doesn't reflect this.  Will continue patient on nicotine patch.  Cessation counseling provided  Poor appetite with severe protein calorie malnutrition: -continue remeron -continue feeding supplements (ensure liquid and pudding; to keep fluid intake in 1L restriction diet).   Code Status: Full Family Communication: no family at bedside Disposition Plan: Patient will like to go back home when medically stable.   Brief narrative: 56 year old female who was just discharged from [facility] today after being treated for pneumonia. Patient states at home she was weak and fell in the bathroom. She states she did not hit her head and she did not lose consciousness. She came back to the ER. Patient does not report any worsening symptoms, no fevers, no shortness of breath, no wheezing. The patient lives alone. Labs in the ER revealed a worsened  hyponatremia with a sodium of 120, with a slightly increased white blood cell count, the hospitalist was requested to admit patient.   Consultants: Nephrology  Antibiotics:  vanc and zosyn  HPI/Subjective: Afebrile. Still coughing; but feeling a lot better. Appetite still poor, but drinking ensure.   Objective: Filed Vitals:   11/05/11 0452 11/05/11 0911 11/05/11 1400 11/05/11 1450  BP: 96/70  111/78   Pulse: 115  106   Temp: 98.9 F (37.2 C)  98.7 F (37.1 C)   TempSrc:   Oral   Resp: 16  18   Height:      Weight:      SpO2: 100% 96% 97% 96%    Intake/Output Summary (Last 24 hours) at 11/05/11 1827 Last data filed at 11/05/11 1700  Gross per 24 hour  Intake   1070 ml  Output   1900 ml  Net   -830 ml   Filed Weights   11/03/11 0455 11/03/11 0629  Weight: 35.29 kg (77 lb 12.8 oz) 35.5 kg (78 lb 4.2 oz)    Exam:   General:  No acute distress, still with mild to moderate coughing spells, denies significant shortness of breath.  Cardiovascular: Mild tachycardia, no rubs or gallops. S1 and S2 appreciated  Respiratory: Good air movement, no wheezing or crackles; positive diffuse rhonchi (R>L)  Abdomen: Soft, nontender, nondistended, positive bowel sounds  Extremities: No edema, no cyanosis or clubbing.  Neuro: Alert, awake and oriented x3, no focal deficit appreciated. Patient with generalized weakness.  Data Reviewed: Basic Metabolic Panel:  Lab 11/05/11 9562 11/05/11 0438 11/04/11 2041 11/04/11 1458 11/04/11 1053 10/31/11 0534 10/30/11 0320  NA 125* 125* 120* 120* 124* -- --  K 3.6 3.6 3.2* 3.1* 3.2* -- --  CL 89* 89* 86* 86* 89* -- --  CO2 26 23 23 23 24  -- --  GLUCOSE 103* 106* 108* 84 109* -- --  BUN 4* <3* 3* <3* <3* -- --  CREATININE 0.44* 0.37* 0.34* 0.33* 0.35* -- --  CALCIUM 8.4 8.6 8.1* 7.9* 8.1* -- --  MG -- -- -- -- -- 2.2 1.1*  PHOS -- -- -- -- -- -- 1.8*   Liver Function Tests:  Lab 11/03/11 0642  AST 18  ALT 10  ALKPHOS 71  BILITOT  0.4  PROT 5.6*  ALBUMIN 1.9*   CBC:  Lab 11/03/11 0642 11/02/11 2317 11/01/11 0418 10/31/11 0534 10/30/11 0320  WBC 12.5* 14.6* 13.3* 7.9 10.1  NEUTROABS -- -- -- -- --  HGB 9.8* 11.0* 9.8* 9.5* 10.6*  HCT 27.0* 30.0* 27.0* 26.3* 28.2*  MCV 75.8* 75.9* 77.8* 76.9* 76.8*  PLT 336 367 416* 455* 408*   BNP (last 3 results)  Basename 10/28/11 2052  PROBNP 133.2*   CBG:  Lab 11/02/11 0746 11/01/11 0835 11/01/11 0740 10/30/11 0742  GLUCAP 106* 79 53* 173*    Recent Results (from the past 240 hour(s))  CULTURE, BLOOD (ROUTINE X 2)     Status: Normal   Collection Time   10/28/11  1:00 PM      Component Value Range Status Comment   Specimen Description BLOOD   Final    Special Requests BOTTLES DRAWN AEROBIC AND ANAEROBIC   Final    Culture  Setup Time 10/29/2011 01:02   Final    Culture NO GROWTH 5 DAYS   Final    Report Status 11/04/2011 FINAL   Final   CULTURE, BLOOD (ROUTINE X 2)     Status: Normal   Collection Time   10/28/11  1:20 PM      Component Value Range Status Comment   Specimen Description BLOOD LEFT ARM   Final    Special Requests BOTTLES DRAWN AEROBIC AND ANAEROBIC 2CC   Final    Culture  Setup Time 10/29/2011 01:02   Final    Culture NO GROWTH 5 DAYS   Final    Report Status 11/04/2011 FINAL   Final   URINE CULTURE     Status: Normal   Collection Time   10/28/11  1:47 PM      Component Value Range Status Comment   Specimen Description URINE, CATHETERIZED   Final    Special Requests NONE   Final    Culture  Setup Time 10/29/2011 01:32   Final    Colony Count NO GROWTH   Final    Culture NO GROWTH   Final    Report Status 10/30/2011 FINAL   Final   MRSA PCR SCREENING     Status: Normal   Collection Time   10/28/11  6:33 PM      Component Value Range Status Comment   MRSA by PCR NEGATIVE  NEGATIVE Final   CULTURE, EXPECTORATED SPUTUM-ASSESSMENT     Status: Normal   Collection Time   10/28/11  8:02 PM      Component Value Range Status Comment   Specimen  Description SPUTUM   Final    Special Requests Normal   Final    Sputum evaluation     Final    Value: MICROSCOPIC FINDINGS SUGGEST THAT THIS SPECIMEN IS NOT REPRESENTATIVE OF LOWER RESPIRATORY SECRETIONS. PLEASE RECOLLECT.     Gram Stain Report Called to,Read Back By and Verified With: S [facility] RN 2116 10/28/11  A NAVARRO   Report Status 10/28/2011 FINAL   Final      Studies: No results found.  Scheduled Meds:    . albuterol  2.5 mg Nebulization TID  . antiseptic oral rinse  15 mL Mouth Rinse BID  . feeding supplement  237 mL Oral BID BM  . feeding supplement  1 Container Oral TID BM  . heparin  5,000 Units Subcutaneous Q8H  . mirtazapine  7.5 mg Oral QHS  . nicotine  14 mg Transdermal Daily  . piperacillin-tazobactam (ZOSYN)  IV  3.375 g Intravenous Q8H  . sodium chloride  2 g Oral BID WC  . vancomycin  500 mg Intravenous Q24H  . DISCONTD: feeding supplement  237 mL Oral TID BM  . DISCONTD: feeding supplement  1 Container Oral QAC breakfast    Time spent: > 30 minutes    Rylynne Schicker  [facility] Pager [phone]. If 8PM-8AM, please contact night-coverage at [email or web address], password [facility] 11/05/2011, 6:27 PM  LOS: 3 days

## 2011-11-05 NOTE — Progress Notes (Signed)
INITIAL ADULT NUTRITION ASSESSMENT Date: 11/05/2011   Time: 10:22 AM Reason for Assessment: Low BMI  ASSESSMENT: Female 56 y.o.  Dx: Hyponatremia, PNA, fall/debility, hypokalemia, COPD  Hx: cocaine abuse, severe protein calorie malntrition Past Medical History  Diagnosis Date  . Rheumatoid arthritis   . Bronchitis   . Myocardial infarction   . Pneumonia    Past Surgical History  Procedure Date  . Tonsillectomy   . Tubal ligation    Related Meds:     . albuterol  2.5 mg Nebulization TID  . antiseptic oral rinse  15 mL Mouth Rinse BID  . feeding supplement  237 mL Oral TID BM  . feeding supplement  1 Container Oral QAC breakfast  . heparin  5,000 Units Subcutaneous Q8H  . mirtazapine  7.5 mg Oral QHS  . nicotine  14 mg Transdermal Daily  . piperacillin-tazobactam (ZOSYN)  IV  3.375 g Intravenous Q8H  . vancomycin  500 mg Intravenous Q24H  . DISCONTD: feeding supplement  237 mL Oral BID BM     Ht: 5\' 1"  (154.9 cm)  Wt: 78 lb 4.2 oz (35.5 kg)  Ideal Wt: 47.8 kg  % Ideal Wt: 74  Usual Wt: 84# last admit 9/19 Wt Readings from Last 10 Encounters:  11/03/11 78 lb 4.2 oz (35.5 kg)  11/02/11 78 lb 3.2 oz (35.471 kg)  02/27/08 87 lb (39.463 kg)  01/19/08 90 lb 6.1 oz (40.997 kg)  11/03/07 88 lb (39.917 kg)   ' % Usual Wt: 93% of weight 1 week ago  Body mass index is 14.79 kg/(m^2).  Labs:  CMP     Component Value Date/Time   NA 125* 11/05/2011 0438   K 3.6 11/05/2011 0438   CL 89* 11/05/2011 0438   CO2 23 11/05/2011 0438   GLUCOSE 106* 11/05/2011 0438   BUN <3* 11/05/2011 0438   CREATININE 0.37* 11/05/2011 0438   CALCIUM 8.6 11/05/2011 0438   PROT 5.6* 11/03/2011 0642   ALBUMIN 1.9* 11/03/2011 0642   AST 18 11/03/2011 0642   ALT 10 11/03/2011 0642   ALKPHOS 71 11/03/2011 0642   BILITOT 0.4 11/03/2011 0642   GFRNONAA >90 11/05/2011 0438   GFRAA >90 11/05/2011 0438    I/O last 3 completed shifts: In: 1520 [P.O.:810; I.V.:120; Other:440; IV Piggyback:150] Out: 2690  [Urine:2690] Total I/O In: 240 [P.O.:240] Out: 100 [Urine:100]   Diet Order: Cardiac, 1 liter fluid restriction  Supplements/Tube Feeding:  Ensure Complete tid, Resource Breeze once daily  IVF:    Estimated Nutritional Needs:   Kcal: 1350-1450  Protein: 55-65 gm  Fluid: 1 L fluid restriction Food/Nutrition Related Hx: Pt with a 5 # weight loss since last RD eval 9/19.  States she was eating well at home except for a couple of days.  Denies using Cocaine during this brief time home.  Reports appetite is now good.  Remeron has been started.  Drinking Ensure but dislikes [employer].  Drank Ensure in  the past but states it is too expensive.  Pt continues to meet criteria for Severe Protein Calorie Malnutrition related to (social-drug [place], chronic and acute illness) AEB severe depletion of muscle mass, body fat, 7% weight loss in the past week.  Pt is 80% IBW with BMI of 14.8 meeting criteria for underweight.  Pt with continued hypernatremia on fluid restriction.  NUTRITION DIAGNOSIS: -Malnutrition (NI-5.2).  Status: Ongoing  RELATED TO: drug use, chronic and acute illness  AS EVIDENCE BY: severe depletion of muscle mass and  body fat, 7% weight loss in the past week.  MONITORING/EVALUATION(Goals): Monitor:  Intake, labs, weight Goal:  Intake of meals and supplements to prevent further weight loss.  EDUCATION NEEDS: -Education needs addressed- discussed continued need to avoid drugs and increase intake  INTERVENTION: Continue Ensure Complete D/C Resource Breeze-pt dislikes Coupon given for Ensure Provide preferences and encouraged po  Dietitian [phone]  DOCUMENTATION CODES Per approved criteria  -Severe malnutrition in the context of acute illness or injury -Underweight    Jennifer Hurley, Jennifer Hurley 11/05/2011, 10:22 AM

## 2011-11-06 ENCOUNTER — Inpatient Hospital Stay (HOSPITAL_COMMUNITY): Payer: Medicare Other

## 2011-11-06 LAB — CBC
HCT: 27 % — ABNORMAL LOW (ref 36.0–46.0)
Hemoglobin: 9.8 g/dL — ABNORMAL LOW (ref 12.0–15.0)
Hemoglobin: 9.3 g/dL — ABNORMAL LOW (ref 12.0–15.0)
MCH: 27.5 pg (ref 26.0–34.0)
MCHC: 35 g/dL (ref 30.0–36.0)
MCV: 75.8 fL — ABNORMAL LOW (ref 78.0–100.0)
Platelets: 357 10*3/uL (ref 150–400)
RBC: 3.35 MIL/uL — ABNORMAL LOW (ref 3.87–5.11)
RDW: 12.7 % (ref 11.5–15.5)
WBC: 9.4 10*3/uL (ref 4.0–10.5)

## 2011-11-06 LAB — BASIC METABOLIC PANEL
CO2: 22 mEq/L (ref 19–32)
Chloride: 86 mEq/L — ABNORMAL LOW (ref 96–112)
Glucose, Bld: 102 mg/dL — ABNORMAL HIGH (ref 70–99)
Potassium: 3.5 mEq/L (ref 3.5–5.1)
Sodium: 122 mEq/L — ABNORMAL LOW (ref 135–145)

## 2011-11-06 LAB — SODIUM: Sodium: 125 mEq/L — ABNORMAL LOW (ref 135–145)

## 2011-11-06 MED ORDER — SODIUM CHLORIDE 0.9 % IV SOLN
INTRAVENOUS | Status: DC
  Administered 2011-11-06: 12:00:00 via INTRAVENOUS

## 2011-11-06 MED ORDER — MAGIC MOUTHWASH
5.0000 mL | Freq: Four times a day (QID) | ORAL | Status: DC | PRN
  Administered 2011-11-06 – 2011-11-09 (×4): 5 mL via ORAL
  Filled 2011-11-06 (×4): qty 5

## 2011-11-06 MED ORDER — TOLVAPTAN 15 MG PO TABS
15.0000 mg | ORAL_TABLET | Freq: Every day | ORAL | Status: DC
  Administered 2011-11-06: 15 mg via ORAL
  Filled 2011-11-06 (×2): qty 1

## 2011-11-06 NOTE — Progress Notes (Addendum)
[facility] PROGRESS NOTE  Jennifer Hurley ZOX:[id number] DOB: 02-25-1955 DOA: 11/02/2011 PCP: No primary provider on file.  Assessment/Plan: Hyponatremia/most likely 2/2 to hypovolemic hyponatremia with water overload -At this moment following renal service recommendations; will continue sodium tablets, continue fluid restriction at 1L daily and continue saline infusion at 125 cc/hr. -cortisol and TSH WNL. -Sodium 122 today   Fall/debility  multifactorial, related to patient's hyponatremia and recent infection. Patient was weak at discharge however she refused physical therapy.  -PT/OT re consulted this time; recommendations are for HHPT   .CAP (community acquired pneumonia) and .Leukocytosis  -Improved but with evidence of necrotizing pneumonia on CXR -Antibiotic changed to vanc and zosyn for better coverage while in the hospital. -WBC's WNL now -will also continue hycodan to control her cough. -Repeat CXR in am  .Hypokalemia  repleted  .COPD  Nebulizers ordered  Patient denies tobacco, alcohol or any illicit drugs. Previous H&P doesn't reflect this.  Will continue patient on nicotine patch.  Cessation counseling provided  Poor appetite with severe protein calorie malnutrition: -continue remeron -continue feeding supplements (ensure liquid and pudding; to keep fluid intake in 1L restriction diet).   Code Status: Full Family Communication: no family at bedside Disposition Plan: Patient will like to go back home when medically stable.   Brief narrative: 56 year old female who was just discharged from [facility] today after being treated for pneumonia. Patient states at home she was weak and fell in the bathroom. She states she did not hit her head and she did not lose consciousness. She came back to the ER. Patient does not report any worsening symptoms, no fevers, no shortness of breath, no wheezing. The patient lives alone. Labs in the ER revealed a worsened  hyponatremia with a sodium of 120, with a slightly increased white blood cell count, the hospitalist was requested to admit patient.   Consultants: Nephrology  Antibiotics:  vanc and zosyn  HPI/Subjective: Afebrile. Cough improved with hycodan; feeling better. Appetite still poor, but drinking ensure. Sodium 122 today  Objective: Filed Vitals:   11/05/11 2112 11/05/11 2115 11/06/11 0435 11/06/11 0838  BP: 99/70  92/60   Pulse: 102  115   Temp: 98.4 F (36.9 C)  98.2 F (36.8 C)   TempSrc: Oral  Oral   Resp: 18     Height:      Weight:      SpO2: 100% 100% 98% 97%    Intake/Output Summary (Last 24 hours) at 11/06/11 1145 Last data filed at 11/06/11 1119  Gross per 24 hour  Intake    850 ml  Output   1450 ml  Net   -600 ml   Filed Weights   11/03/11 0455 11/03/11 0629  Weight: 35.29 kg (77 lb 12.8 oz) 35.5 kg (78 lb 4.2 oz)    Exam:   General:  No acute distress, improved coughing spells, denies significant shortness of breath.  Cardiovascular: Mild tachycardia, no rubs or gallops. S1 and S2 appreciated  Respiratory: Good air movement, no wheezing or crackles; positive diffuse rhonchi (R>L)  Abdomen: Soft, nontender, nondistended, positive bowel sounds  Extremities: No edema, no cyanosis or clubbing.  Neuro: Alert, awake and oriented x3, no focal deficit appreciated. Patient with generalized weakness.  Data Reviewed: Basic Metabolic Panel:  Lab 11/06/11 8119 11/05/11 1300 11/05/11 0438 11/04/11 2041 11/04/11 1458 10/31/11 0534  NA 122* 125* 125* 120* 120* --  K 3.5 3.6 3.6 3.2* 3.1* --  CL 86* 89* 89* 86* 86* --  CO2 22 26 23 23 23  --  GLUCOSE 102* 103* 106* 108* 84 --  BUN 3* 4* <3* 3* <3* --  CREATININE 0.40* 0.44* 0.37* 0.34* 0.33* --  CALCIUM 8.4 8.4 8.6 8.1* 7.9* --  MG -- -- -- -- -- 2.2  PHOS -- -- -- -- -- --   Liver Function Tests:  Lab 11/03/11 0642  AST 18  ALT 10  ALKPHOS 71  BILITOT 0.4  PROT 5.6*  ALBUMIN 1.9*   CBC:  Lab  11/06/11 0500 11/03/11 0642 11/02/11 2317 11/01/11 0418 10/31/11 0534  WBC 9.4 12.5* 14.6* 13.3* 7.9  NEUTROABS -- -- -- -- --  HGB 9.3* 9.8* 11.0* 9.8* 9.5*  HCT 25.4* 27.0* 30.0* 27.0* 26.3*  MCV 75.8* 75.8* 75.9* 77.8* 76.9*  PLT 357 336 367 416* 455*   BNP (last 3 results)  Basename 10/28/11 2052  PROBNP 133.2*   CBG:  Lab 11/02/11 0746 11/01/11 0835 11/01/11 0740  GLUCAP 106* 79 53*    Recent Results (from the past 240 hour(s))  CULTURE, BLOOD (ROUTINE X 2)     Status: Normal   Collection Time   10/28/11  1:00 PM      Component Value Range Status Comment   Specimen Description BLOOD   Final    Special Requests BOTTLES DRAWN AEROBIC AND ANAEROBIC   Final    Culture  Setup Time 10/29/2011 01:02   Final    Culture NO GROWTH 5 DAYS   Final    Report Status 11/04/2011 FINAL   Final   CULTURE, BLOOD (ROUTINE X 2)     Status: Normal   Collection Time   10/28/11  1:20 PM      Component Value Range Status Comment   Specimen Description BLOOD LEFT ARM   Final    Special Requests BOTTLES DRAWN AEROBIC AND ANAEROBIC 2CC   Final    Culture  Setup Time 10/29/2011 01:02   Final    Culture NO GROWTH 5 DAYS   Final    Report Status 11/04/2011 FINAL   Final   URINE CULTURE     Status: Normal   Collection Time   10/28/11  1:47 PM      Component Value Range Status Comment   Specimen Description URINE, CATHETERIZED   Final    Special Requests NONE   Final    Culture  Setup Time 10/29/2011 01:32   Final    Colony Count NO GROWTH   Final    Culture NO GROWTH   Final    Report Status 10/30/2011 FINAL   Final   MRSA PCR SCREENING     Status: Normal   Collection Time   10/28/11  6:33 PM      Component Value Range Status Comment   MRSA by PCR NEGATIVE  NEGATIVE Final   CULTURE, EXPECTORATED SPUTUM-ASSESSMENT     Status: Normal   Collection Time   10/28/11  8:02 PM      Component Value Range Status Comment   Specimen Description SPUTUM   Final    Special Requests Normal   Final     Sputum evaluation     Final    Value: MICROSCOPIC FINDINGS SUGGEST THAT THIS SPECIMEN IS NOT REPRESENTATIVE OF LOWER RESPIRATORY SECRETIONS. PLEASE RECOLLECT.     Gram Stain Report Called to,Read Back By and Verified With: S [facility] RN 2116 10/28/11 A NAVARRO   Report Status 10/28/2011 FINAL   Final      Studies: No results  found.  Scheduled Meds:    . albuterol  2.5 mg Nebulization TID  . antiseptic oral rinse  15 mL Mouth Rinse BID  . feeding supplement  237 mL Oral BID BM  . feeding supplement  1 Container Oral TID BM  . heparin  5,000 Units Subcutaneous Q8H  . mirtazapine  7.5 mg Oral QHS  . nicotine  14 mg Transdermal Daily  . piperacillin-tazobactam (ZOSYN)  IV  3.375 g Intravenous Q8H  . sodium chloride  2 g Oral BID WC  . vancomycin  500 mg Intravenous Q24H  . DISCONTD: feeding supplement  237 mL Oral TID BM    Time spent: > 30 minutes    Danity Schmelzer  [facility] Pager [phone]. If 8PM-8AM, please contact night-coverage at [email or web address], password [facility] 11/06/2011, 11:45 AM  LOS: 4 days

## 2011-11-06 NOTE — Progress Notes (Signed)
Physical Therapy Treatment Patient Details Name: Jennifer Hurley MRN: [id number] DOB: [birth year] Today's Date: 11/06/2011 Time: 4332-9518 PT Time Calculation (min): 12 min  PT Assessment / Plan / Recommendation Comments on Treatment Session  Pt appeared disinterested and requires MAX encouragement to participate.  Only got OOB because she needed to void.  Pt wanted to use BSC but instructed pt heer goal was to go home, so "we are going into the real bathroom". Pt self limiting and clearly disinterested to help herself.    Follow Up Recommendations  Home health PT    Barriers to Discharge        Equipment Recommendations  Rolling walker with 5" wheels;3 in 1 bedside comode    Recommendations for Other Services    Frequency Min 3X/week   Plan Discharge plan remains appropriate    Precautions / Restrictions     Pertinent Vitals/Pain No c/o pain    Mobility  Bed Mobility Bed Mobility: Supine to Sit Supine to Sit: 7: Independent Details for Bed Mobility Assistance: increased time  Transfers Transfers: Sit to Stand;Stand to Sit Sit to Stand: 6: Modified independent (Device/Increase time);From bed;From toilet Stand to Sit: 6: Modified independent (Device/Increase time);To toilet;To chair/3-in-1 Details for Transfer Assistance: Pt required max encouragement  Ambulation/Gait Ambulation/Gait Assistance: 5: Supervision Ambulation Distance (Feet): 145 Feet Assistive device: Rolling walker Ambulation/Gait Assistance Details: good alternating gait Gait Pattern: Step-through pattern    PT Goals  progressing    Visit Information  Last PT Received On: 11/06/11 Assistance Needed: +1                   End of Session PT - End of Session Activity Tolerance: Patient tolerated treatment well Patient left: in chair;with call bell/phone within reach;with chair alarm set  Felecia Shelling  PTA [facility]  Acute  Rehab Pager     [phone]

## 2011-11-06 NOTE — Progress Notes (Signed)
ANTIBIOTIC CONSULT NOTE - FOLLOW UP  Pharmacy Consult for Vanco Indication: PNA  No Known Allergies  Patient Measurements: Height: 5\' 1"  (154.9 cm) Weight: 78 lb 4.2 oz (35.5 kg) IBW/kg (Calculated) : 47.8   Vital Signs: Temp: 98.2 F (36.8 C) (09/27 0435) Temp src: Oral (09/27 0435) BP: 92/60 mmHg (09/27 0435) Pulse Rate: 115  (09/27 0435) Intake/Output from previous day: 09/26 0701 - 09/27 0700 In: 990 [P.O.:700; I.V.:40; IV Piggyback:250] Out: 1150 [Urine:1150] Intake/Output from this shift: Total I/O In: 100 [IV Piggyback:100] Out: 400 [Urine:400]  Labs:  Basename 11/06/11 0500 11/05/11 1300 11/05/11 0438  WBC 9.4 -- --  HGB 9.3* -- --  PLT 357 -- --  LABCREA -- -- --  CREATININE 0.40* 0.44* 0.37*   Estimated Creatinine Clearance: 45.1 ml/min (by C-G formula based on Cr of 0.4). No results found for this basename: VANCOTROUGH:2,VANCOPEAK:2,VANCORANDOM:2,GENTTROUGH:2,GENTPEAK:2,GENTRANDOM:2,TOBRATROUGH:2,TOBRAPEAK:2,TOBRARND:2,AMIKACINPEAK:2,AMIKACINTROU:2,AMIKACIN:2, in the last 72 hours   Microbiology: Recent Results (from the past 720 hour(s))  CULTURE, BLOOD (ROUTINE X 2)     Status: Normal   Collection Time   10/28/11  1:00 PM      Component Value Range Status Comment   Specimen Description BLOOD   Final    Special Requests BOTTLES DRAWN AEROBIC AND ANAEROBIC   Final    Culture  Setup Time 10/29/2011 01:02   Final    Culture NO GROWTH 5 DAYS   Final    Report Status 11/04/2011 FINAL   Final   CULTURE, BLOOD (ROUTINE X 2)     Status: Normal   Collection Time   10/28/11  1:20 PM      Component Value Range Status Comment   Specimen Description BLOOD LEFT ARM   Final    Special Requests BOTTLES DRAWN AEROBIC AND ANAEROBIC 2CC   Final    Culture  Setup Time 10/29/2011 01:02   Final    Culture NO GROWTH 5 DAYS   Final    Report Status 11/04/2011 FINAL   Final   URINE CULTURE     Status: Normal   Collection Time   10/28/11  1:47 PM      Component Value  Range Status Comment   Specimen Description URINE, CATHETERIZED   Final    Special Requests NONE   Final    Culture  Setup Time 10/29/2011 01:32   Final    Colony Count NO GROWTH   Final    Culture NO GROWTH   Final    Report Status 10/30/2011 FINAL   Final   MRSA PCR SCREENING     Status: Normal   Collection Time   10/28/11  6:33 PM      Component Value Range Status Comment   MRSA by PCR NEGATIVE  NEGATIVE Final   CULTURE, EXPECTORATED SPUTUM-ASSESSMENT     Status: Normal   Collection Time   10/28/11  8:02 PM      Component Value Range Status Comment   Specimen Description SPUTUM   Final    Special Requests Normal   Final    Sputum evaluation     Final    Value: MICROSCOPIC FINDINGS SUGGEST THAT THIS SPECIMEN IS NOT REPRESENTATIVE OF LOWER RESPIRATORY SECRETIONS. PLEASE RECOLLECT.     Gram Stain Report Called to,Read Back By and Verified With: S [facility] RN 2116 10/28/11 A NAVARRO   Report Status 10/28/2011 FINAL   Final     Anti-infectives     Start     Dose/Rate Route Frequency Ordered Stop  11/04/11 1200   vancomycin (VANCOCIN) 500 mg in sodium chloride 0.9 % 100 mL IVPB        500 mg 100 mL/hr over 60 Minutes Intravenous Every 24 hours 11/03/11 0630     11/03/11 0445   vancomycin (VANCOCIN) IVPB 1000 mg/200 mL premix        1,000 mg 200 mL/hr over 60 Minutes Intravenous  Once 11/03/11 0432 11/03/11 0723   11/03/11 0430  piperacillin-tazobactam (ZOSYN) IVPB 3.375 g       3.375 g 12.5 mL/hr over 240 Minutes Intravenous 3 times per day 11/03/11 1610            Assessment: 56 yo F on D#4 Vanc (plus Zosyn EI) for HAP (patient treated with Rocephin/Zithromax for CAP previously). Now with evidence of necrotizing PNA on CXR. Given low weight and SCr, will check Vanco trough at steady state (tomorrow prior to the 4th dose).  Goal of Therapy:  Vancomycin trough level 15-20 mcg/ml  Plan:  1) Continue Vanco 500mg  IV q24h 2) Vanco trough tomorrow prior to 12:00  dose.  Darrol Angel, PharmD Pager: [phone] 11/06/2011,11:49 AM

## 2011-11-06 NOTE — Progress Notes (Signed)
Subjective: Na down to 122 today.   Objective Vital signs in last 24 hours: Filed Vitals:   11/05/11 2115 11/06/11 0435 11/06/11 0838 11/06/11 1350  BP:  92/60  101/68  Pulse:  115  115  Temp:  98.2 F (36.8 C)  98.7 F (37.1 C)  TempSrc:  Oral  Oral  Resp:    18  Height:      Weight:      SpO2: 100% 98% 97% 100%   Weight change:   Intake/Output Summary (Last 24 hours) at 11/06/11 1611 Last data filed at 11/06/11 1300  Gross per 24 hour  Intake    940 ml  Output   1600 ml  Net   -660 ml   Labs: Basic Metabolic Panel:  Lab 11/06/11 4098 11/05/11 1300 11/05/11 0438 11/04/11 2041 11/04/11 1458 11/04/11 1053 11/04/11 0704  NA 122* 125* 125* 120* 120* 124* 124*  K 3.5 3.6 3.6 3.2* 3.1* 3.2* 3.5  CL 86* 89* 89* 86* 86* 89* 89*  CO2 22 26 23 23 23 24 26   GLUCOSE 102* 103* 106* 108* 84 109* 82  BUN 3* 4* <3* 3* <3* <3* <3*  CREATININE 0.40* 0.44* 0.37* 0.34* 0.33* 0.35* 0.37*  ALB -- -- -- -- -- -- --  CALCIUM 8.4 8.4 8.6 8.1* 7.9* 8.1* 8.2*  PHOS -- -- -- -- -- -- --   Liver Function Tests:  Lab 11/03/11 0642  AST 18  ALT 10  ALKPHOS 71  BILITOT 0.4  PROT 5.6*  ALBUMIN 1.9*   No results found for this basename: LIPASE:3,AMYLASE:3 in the last 168 hours No results found for this basename: AMMONIA:3 in the last 168 hours CBC:  Lab 11/06/11 0500 11/03/11 0642 11/02/11 2317 11/01/11 0418  WBC 9.4 12.5* 14.6* 13.3*  NEUTROABS -- -- -- --  HGB 9.3* 9.8* 11.0* 9.8*  HCT 25.4* 27.0* 30.0* 27.0*  MCV 75.8* 75.8* 75.9* 77.8*  PLT 357 336 367 416*   PT/INR: @labrcntip (inr:5) Cardiac Enzymes: No results found for this basename: CKTOTAL:5,CKMB:5,CKMBINDEX:5,TROPONINI:5 in the last 168 hours CBG:  Lab 11/02/11 0746 11/01/11 0835 11/01/11 0740  GLUCAP 106* 79 53*    Iron Studies: No results found for this basename: IRON:30,TIBC:30,TRANSFERRIN:30,FERRITIN:30 in the last 168 hours  Physical Exam:  Blood pressure 101/68, pulse 115, temperature 98.7 F (37.1 C),  temperature source Oral, resp. rate 18, height 5\' 1"  (1.549 m), weight 35.5 kg (78 lb 4.2 oz), SpO2 100.00%.  Gen: frail adult female, very thin, alert in no distress  Skin: no rash, cyanosis  HEENT: EOMI, sclera anicteric, throat clear  Neck: no JVD, no bruits or LAN  Chest: dec'd air movement throughout, no wheezing, crackles L base  Heart: regular, no rub or gallop, 2/6 SEM at LLSB  Abdomen: soft, scaphoid, nontender, no mass or ascites, no HM  Ext: no LE or UE edema, no ulcer or gangrene  Neuro: alert, Ox3, no focal deficit   Impression/Plan  1. Hyponatremia- no longer improving with IV normal saline. Volume repleted now.  Will d/c IVF's and try tolvaptan (15 mg/d), to start tonight. 2. PNA 3. COPD 4. Cocaine abuse 5. Malnutrition/low albumin/cachexia  Vinson Moselle  MD [facility] [phone] pgr    [phone] cell 11/06/2011, 4:11 PM

## 2011-11-07 ENCOUNTER — Inpatient Hospital Stay (HOSPITAL_COMMUNITY): Payer: Medicare Other

## 2011-11-07 LAB — RENAL FUNCTION PANEL
Albumin: 2.1 g/dL — ABNORMAL LOW (ref 3.5–5.2)
BUN: 3 mg/dL — ABNORMAL LOW (ref 6–23)
GFR calc non Af Amer: >90 mL/min (ref >90)
Phosphorus: 4 mg/dL (ref 2.3–4.6)
Potassium: 3.4 mEq/L — ABNORMAL LOW (ref 3.5–5.1)
Sodium: 134 mEq/L — ABNORMAL LOW (ref 135–145)

## 2011-11-07 LAB — VANCOMYCIN, TROUGH: Vancomycin Tr: <5 ug/mL — ABNORMAL LOW (ref 10.0–20.0)

## 2011-11-07 LAB — IRON AND TIBC: UIBC: 121 ug/dL — ABNORMAL LOW (ref 125–400)

## 2011-11-07 LAB — OSMOLALITY, URINE: Osmolality, Ur: 263 mOsm/kg — ABNORMAL LOW (ref 390–1090)

## 2011-11-07 LAB — SODIUM: Sodium: 135 mEq/L (ref 135–145)

## 2011-11-07 MED ORDER — IPRATROPIUM BROMIDE 0.02 % IN SOLN
0.5000 mg | Freq: Four times a day (QID) | RESPIRATORY_TRACT | Status: DC | PRN
  Administered 2011-11-07: 0.5 mg via RESPIRATORY_TRACT
  Filled 2011-11-07: qty 2.5

## 2011-11-07 MED ORDER — TIOTROPIUM BROMIDE MONOHYDRATE 18 MCG IN CAPS
18.0000 ug | ORAL_CAPSULE | Freq: Every day | RESPIRATORY_TRACT | Status: DC
  Administered 2011-11-07 – 2011-11-13 (×7): 18 ug via RESPIRATORY_TRACT
  Filled 2011-11-07 (×2): qty 5

## 2011-11-07 MED ORDER — SODIUM CHLORIDE 0.9 % IV SOLN: 250.0000 mL | INTRAVENOUS | Status: DC | PRN

## 2011-11-07 MED ORDER — VANCOMYCIN HCL 500 MG IV SOLR
500.0000 mg | Freq: Three times a day (TID) | INTRAVENOUS | Status: DC
  Administered 2011-11-07 – 2011-11-08 (×4): 500 mg via INTRAVENOUS
  Filled 2011-11-07 (×4): qty 500

## 2011-11-07 MED ORDER — POTASSIUM CHLORIDE CRYS ER 20 MEQ PO TBCR
40.0000 meq | EXTENDED_RELEASE_TABLET | ORAL | Status: AC
  Administered 2011-11-07 (×2): 40 meq via ORAL
  Filled 2011-11-07 (×2): qty 2

## 2011-11-07 MED ORDER — ALBUTEROL SULFATE (5 MG/ML) 0.5% IN NEBU: 2.5000 mg | INHALATION_SOLUTION | RESPIRATORY_TRACT | Status: DC | PRN

## 2011-11-07 NOTE — Progress Notes (Signed)
ANTIBIOTIC CONSULT NOTE - FOLLOW UP  Pharmacy Consult for Vanco Indication: PNA  No Known Allergies  Patient Measurements: Height: 5\' 1"  (154.9 cm) Weight: 78 lb 4.2 oz (35.5 kg) IBW/kg (Calculated) : 47.8   Vital Signs: Temp: 98 F (36.7 C) (09/28 0635) Temp src: Oral (09/28 0635) BP: 82/60 mmHg (09/28 0635) Pulse Rate: 86  (09/28 0635) Intake/Output from previous day: 09/27 0701 - 09/28 0700 In: 875 [P.O.:480; I.V.:245; IV Piggyback:150] Out: 2755 [Urine:2755]  Labs:  [facility] 11/07/11 0443 11/06/11 0500 11/05/11 1300  WBC -- 9.4 --  HGB -- 9.3* --  PLT -- 357 --  LABCREA -- -- --  CREATININE 0.42* 0.40* 0.44*   Estimated Creatinine Clearance: 45.1 ml/min (by C-G formula based on Cr of 0.42).  Basename 11/07/11 1115  VANCOTROUGH <5.0*  VANCOPEAK --  Drue Dun --  GENTTROUGH --  GENTPEAK --  GENTRANDOM --  TOBRATROUGH --  TOBRAPEAK --  TOBRARND --  AMIKACINPEAK --  AMIKACINTROU --  AMIKACIN --      Anti-infectives     Start     Dose/Rate Route Frequency Ordered Stop   11/04/11 1200   vancomycin (VANCOCIN) 500 mg in sodium chloride 0.9 % 100 mL IVPB        500 mg 100 mL/hr over 60 Minutes Intravenous Every 24 hours 11/03/11 0630     11/03/11 0445   vancomycin (VANCOCIN) IVPB 1000 mg/200 mL premix        1,000 mg 200 mL/hr over 60 Minutes Intravenous  Once 11/03/11 0432 11/03/11 0723   11/03/11 0430   piperacillin-tazobactam (ZOSYN) IVPB 3.375 g        3.375 g 12.5 mL/hr over 240 Minutes Intravenous 3 times per day 11/03/11 1610            Assessment:  55 yo F admit for HAP (patient treated with Rocephin/Zithromax for CAP previously). Now with evidence of necrotizing PNA on CXR.   Day #5 Vanc and zosyn.  Vancomycin trough level is below goal.  Renal function has remained stable.  Goal of Therapy:  Vancomycin trough level 15-20 mcg/ml  Plan:   Increase to Vancomycin 500mg  IV q8h.  Measure Vanc trough at steady state.  Follow up  renal fxn and culture results.  Lynann Beaver PharmD, BCPS Pager [phone] 11/07/2011 12:30 PM

## 2011-11-07 NOTE — Progress Notes (Signed)
[facility] PROGRESS NOTE  Jennifer Hurley ZOX:[id number] DOB: 03/05/1955 DOA: 11/02/2011 PCP: No primary provider on file.  Assessment/Plan: Hyponatremia/most likely 2/2 to hypovolemic hyponatremia with water overload -At this moment following renal service recommendations; will continue sodium tablets, continue fluid restriction at 1L daily. Patient received 1 dose of  tolvaptan and sodium is now 134. Will encourage PO intake. Renal function in AM to follow electrolytes. -cortisol and TSH WNL. -Sodium 134 today  Fall/debility  multifactorial, related to patient's hyponatremia and recent infection. Patient was weak at discharge however she refused physical therapy.  -PT/OT re consulted this time; recommendations are for HHPT (will be arranged at discharged)  .CAP (community acquired pneumonia) and .Leukocytosis  -clinically Improved but with evidence of necrotizing pneumonia on admitting CXR and also follow CXR -Discussed with Id; will continue vanc and zosyn for now (broad spectrum coverage that guarantee negative microorganism and MRSA). -WBC's WNL now -will repeat CT chest for better visualization of a possible abscess where the cavitary lesion is described; and if that is the case will touch bases with pulmonology for bronch and drainage vs VATS or just abx's.  .Hypokalemia  repleted  .COPD  Nebulizers ordered  Patient denies tobacco, alcohol or any illicit drugs. Previous H&P doesn't reflect this.  Will continue patient on nicotine patch.  Cessation counseling provided  Poor appetite with severe protein calorie malnutrition: -continue remeron -continue feeding supplements (ensure liquid and pudding; to keep fluid intake in 1L restriction diet).   Code Status: Full Family Communication: no family at bedside Disposition Plan: Patient will like to go back home when medically stable.   Brief narrative: 56 year old female who was just discharged from [facility] today  after being treated for pneumonia. Patient states at home she was weak and fell in the bathroom. She states she did not hit her head and she did not lose consciousness. She came back to the ER. Patient does not report any worsening symptoms, no fevers, no shortness of breath, no wheezing. The patient lives alone. Labs in the ER revealed a worsened hyponatremia with a sodium of 120, with a slightly increased white blood cell count, the hospitalist was requested to admit patient.   Consultants: Nephrology [facility] curbside (Dr. Daiva Eves)  Antibiotics:  vanc and zosyn  HPI/Subjective: Afebrile. Feeling a lot better today. Sodium 134; reports still poor appetite.  Objective: Filed Vitals:   11/06/11 2245 11/07/11 0635 11/07/11 0815 11/07/11 1418  BP: 131/81 82/60  91/62  Pulse: 111 86  68  Temp: 97.4 F (36.3 C) 98 F (36.7 C)  98.1 F (36.7 C)  TempSrc: Oral Oral  Oral  Resp:  18  18  Height:      Weight:      SpO2: 100% 97% 100% 98%    Intake/Output Summary (Last 24 hours) at 11/07/11 1617 Last data filed at 11/07/11 1514  Gross per 24 hour  Intake    630 ml  Output   3755 ml  Net  -3125 ml   Filed Weights   11/03/11 0455 11/03/11 0629  Weight: 35.29 kg (77 lb 12.8 oz) 35.5 kg (78 lb 4.2 oz)    Exam:   General:  NAD, improved coughing spells, denies significant shortness of breath.  Cardiovascular: Mild tachycardia, no rubs or gallops. S1 and S2 appreciated  Respiratory: Good air movement, no wheezing or crackles; positive diffuse rhonchi (R>L)  Abdomen: Soft, nontender, nondistended, positive bowel sounds  Extremities: No edema, no cyanosis or clubbing.  Neuro:  Alert, awake and oriented x3, no focal deficit appreciated. Patient with generalized weakness.  Data Reviewed: Basic Metabolic Panel:  Lab 11/07/11 4098 11/06/11 2340 11/06/11 1657 11/06/11 0500 11/05/11 1300 11/05/11 0438 11/04/11 2041  NA 134* 135 125* 122* 125* -- --  K 3.4* -- -- 3.5 3.6 3.6 3.2*  CL  99 -- -- 86* 89* 89* 86*  CO2 24 -- -- 22 26 23 23   GLUCOSE 87 -- -- 102* 103* 106* 108*  BUN 3* -- -- 3* 4* <3* 3*  CREATININE 0.42* -- -- 0.40* 0.44* 0.37* 0.34*  CALCIUM 8.7 -- -- 8.4 8.4 8.6 8.1*  MG -- -- -- -- -- -- --  PHOS 4.0 -- -- -- -- -- --   Liver Function Tests:  Lab 11/07/11 0443 11/03/11 0642  AST -- 18  ALT -- 10  ALKPHOS -- 71  BILITOT -- 0.4  PROT -- 5.6*  ALBUMIN 2.1* 1.9*   CBC:  Lab 11/06/11 0500 11/03/11 0642 11/02/11 2317 11/01/11 0418  WBC 9.4 12.5* 14.6* 13.3*  NEUTROABS -- -- -- --  HGB 9.3* 9.8* 11.0* 9.8*  HCT 25.4* 27.0* 30.0* 27.0*  MCV 75.8* 75.8* 75.9* 77.8*  PLT 357 336 367 416*   BNP (last 3 results)  Basename 10/28/11 2052  PROBNP 133.2*   CBG:  Lab 11/02/11 0746 11/01/11 0835 11/01/11 0740  GLUCAP 106* 79 53*    Recent Results (from the past 240 hour(s))  MRSA PCR SCREENING     Status: Normal   Collection Time   10/28/11  6:33 PM      Component Value Range Status Comment   MRSA by PCR NEGATIVE  NEGATIVE Final   CULTURE, EXPECTORATED SPUTUM-ASSESSMENT     Status: Normal   Collection Time   10/28/11  8:02 PM      Component Value Range Status Comment   Specimen Description SPUTUM   Final    Special Requests Normal   Final    Sputum evaluation     Final    Value: MICROSCOPIC FINDINGS SUGGEST THAT THIS SPECIMEN IS NOT REPRESENTATIVE OF LOWER RESPIRATORY SECRETIONS. PLEASE RECOLLECT.     Gram Stain Report Called to,Read Back By and Verified With: S [facility] RN 2116 10/28/11 A NAVARRO   Report Status 10/28/2011 FINAL   Final      Studies: Dg Chest 2 View  11/06/2011  *RADIOLOGY REPORT*  Clinical Data: Follow up cavitary right upper lobe pneumonia.  CHEST - 2 VIEW  Comparison: Two-view chest x-ray 11/03/2011.  Portable chest x-rays dating back to 10/28/2011.  Findings: Further aeration of the cavity within the area of necrotic pneumonia in the right upper lobe since the examination 3 days ago.  No change in the airspace  consolidation.  No new pulmonary parenchymal abnormalities elsewhere.  Stable severe COPD/emphysema and left upper lobe scarring.  Cardiomediastinal silhouette unremarkable, unchanged.  Generalized osteopenia again noted.  IMPRESSION: Further aeration of the cavity within the necrotic right upper lobe pneumonia, though the airspace consolidation is unchanged since the examination 3 days ago.  This is superimposed upon severe COPD/emphysema.  No new abnormalities.   Original Report Authenticated By: Arnell Sieving, M.D.     Scheduled Meds:    . albuterol  2.5 mg Nebulization TID  . antiseptic oral rinse  15 mL Mouth Rinse BID  . feeding supplement  237 mL Oral BID BM  . feeding supplement  1 Container Oral TID BM  . heparin  5,000 Units Subcutaneous Q8H  .  mirtazapine  7.5 mg Oral QHS  . nicotine  14 mg Transdermal Daily  . piperacillin-tazobactam (ZOSYN)  IV  3.375 g Intravenous Q8H  . potassium chloride  40 mEq Oral Q4H  . sodium chloride  2 g Oral BID WC  . tiotropium  18 mcg Inhalation Daily  . vancomycin  500 mg Intravenous Q8H  . DISCONTD: tolvaptan  15 mg Oral Daily  . DISCONTD: vancomycin  500 mg Intravenous Q24H    Time spent: > 30 minutes    Cia Garretson  [facility] Pager [phone]. If 8PM-8AM, please contact night-coverage at [email or web address], password [facility] 11/07/2011, 4:17 PM  LOS: 5 days

## 2011-11-07 NOTE — Progress Notes (Signed)
Adamantly refuses CT.  Radiology staff informed. Dr. Gwenlyn Perking also informed.

## 2011-11-07 NOTE — Progress Notes (Signed)
Subjective: Interval History: has complaints is asking everyone for water.  Objective: Vital signs in last 24 hours: Temp:  [97.4 F (36.3 C)-98.7 F (37.1 C)] 98 F (36.7 C) (09/28 0635) Pulse Rate:  [86-115] 86  (09/28 0635) Resp:  [18] 18  (09/28 0635) BP: (82-131)/(60-81) 82/60 mmHg (09/28 0635) SpO2:  [97 %-100 %] 100 % (09/28 0815) Weight change:   Intake/Output from previous day: 09/27 0701 - 09/28 0700 In: 875 [P.O.:480; I.V.:245; IV Piggyback:150] Out: 2755 [Urine:2755] Intake/Output this shift: Total I/O In: 240 [P.O.:240] Out: 700 [Urine:700]  General appearance: cooperative, cachectic and delirious Resp: diminished breath sounds bilaterally Cardio: S1, S2 normal and systolic murmur: holosystolic 2/6, blowing at apex GI: liver down 5 cm, pos bs,   Lab Results:  Basename 11/06/11 0500  WBC 9.4  HGB 9.3*  HCT 25.4*  PLT 357   BMET:  Basename 11/07/11 0443 11/06/11 2340 11/06/11 0500  NA 134* 135 --  K 3.4* -- 3.5  CL 99 -- 86*  CO2 24 -- 22  GLUCOSE 87 -- 102*  BUN 3* -- 3*  CREATININE 0.42* -- 0.40*  CALCIUM 8.7 -- 8.4   No results found for this basename: PTH:2 in the last 72 hours Iron Studies: No results found for this basename: IRON,TIBC,TRANSFERRIN,FERRITIN in the last 72 hours  Studies/Results: Dg Chest 2 View  11/06/2011  *RADIOLOGY REPORT*  Clinical Data: Follow up cavitary right upper lobe pneumonia.  CHEST - 2 VIEW  Comparison: Two-view chest x-ray 11/03/2011.  Portable chest x-rays dating back to 10/28/2011.  Findings: Further aeration of the cavity within the area of necrotic pneumonia in the right upper lobe since the examination 3 days ago.  No change in the airspace consolidation.  No new pulmonary parenchymal abnormalities elsewhere.  Stable severe COPD/emphysema and left upper lobe scarring.  Cardiomediastinal silhouette unremarkable, unchanged.  Generalized osteopenia again noted.  IMPRESSION: Further aeration of the cavity within the  necrotic right upper lobe pneumonia, though the airspace consolidation is unchanged since the examination 3 days ago.  This is superimposed upon severe COPD/emphysema.  No new abnormalities.   Original Report Authenticated By: Arnell Sieving, M.D.     I have reviewed the patient's current medications.  Assessment/Plan: 1 Hyponatremia resolved.  Secondary to pneu, meds. 2 anemia check Fe 3 Substance abuse 4 Psych  5 malnutrition  P d/C tolvaptan, follow S Na, fluid restrict    LOS: 5 days   Jennifer Hurley L 11/07/2011,11:19 AM

## 2011-11-08 ENCOUNTER — Inpatient Hospital Stay (HOSPITAL_COMMUNITY): Payer: Medicare Other

## 2011-11-08 LAB — MAGNESIUM: Magnesium: 1.6 mg/dL (ref 1.5–2.5)

## 2011-11-08 LAB — RENAL FUNCTION PANEL
Albumin: 2.2 g/dL — ABNORMAL LOW (ref 3.5–5.2)
GFR calc Af Amer: >90 mL/min (ref >90)
Phosphorus: 4.4 mg/dL (ref 2.3–4.6)
Potassium: 3.4 mEq/L — ABNORMAL LOW (ref 3.5–5.1)
Sodium: 131 mEq/L — ABNORMAL LOW (ref 135–145)

## 2011-11-08 LAB — CBC
HCT: 27.2 % — ABNORMAL LOW (ref 36.0–46.0)
Hemoglobin: 9.7 g/dL — ABNORMAL LOW (ref 12.0–15.0)
MCHC: 35.7 g/dL (ref 30.0–36.0)
MCV: 76.6 fL — ABNORMAL LOW (ref 78.0–100.0)

## 2011-11-08 MED ORDER — SULFAMETHOXAZOLE-TMP DS 800-160 MG PO TABS
1.0000 | ORAL_TABLET | Freq: Two times a day (BID) | ORAL | Status: DC
  Administered 2011-11-08 – 2011-11-09 (×2): 1 via ORAL
  Filled 2011-11-08 (×3): qty 1

## 2011-11-08 MED ORDER — POTASSIUM CHLORIDE CRYS ER 20 MEQ PO TBCR
40.0000 meq | EXTENDED_RELEASE_TABLET | ORAL | Status: AC
  Administered 2011-11-08 (×2): 40 meq via ORAL
  Filled 2011-11-08 (×2): qty 2

## 2011-11-08 MED ORDER — IOHEXOL 300 MG/ML  SOLN
80.0000 mL | Freq: Once | INTRAMUSCULAR | Status: AC | PRN
  Administered 2011-11-08: 80 mL via INTRAVENOUS

## 2011-11-08 NOTE — Progress Notes (Signed)
Subjective: Interval History: none.  Objective: Vital signs in last 24 hours: Temp:  [98.1 F (36.7 C)-98.3 F (36.8 C)] 98.2 F (36.8 C) (09/29 0610) Pulse Rate:  [68-104] 104  (09/29 0610) Resp:  [18] 18  (09/29 0610) BP: (86-108)/(60-72) 94/72 mmHg (09/29 0610) SpO2:  [98 %-100 %] 100 % (09/29 0806) Weight change:   Intake/Output from previous day: 09/28 0701 - 09/29 0700 In: 1576 [P.O.:1226; IV Piggyback:350] Out: 4425 [Urine:4425] Intake/Output this shift: Total I/O In: 120 [P.O.:120] Out: 450 [Urine:450]  General appearance: alert, cachectic and slowed mentation Resp: diminished breath sounds bilaterally Cardio: S1, S2 normal GI: scaphoid, soft, pos bs, mild tender Extremities: extremities normal, atraumatic, no cyanosis or edema  Lab Results:  Basename 11/08/11 0523 11/06/11 0500  WBC 7.2 9.4  HGB 9.7* 9.3*  HCT 27.2* 25.4*  PLT 394 357   BMET:  Basename 11/08/11 0523 11/07/11 0443  NA 131* 134*  K 3.4* 3.4*  CL 94* 99  CO2 21 24  GLUCOSE 90 87  BUN 4* 3*  CREATININE 0.47* 0.42*  CALCIUM 9.0 8.7   No results found for this basename: PTH:2 in the last 72 hours Iron Studies:  Basename 11/07/11 1202  IRON 36*  TIBC 157*  TRANSFERRIN --  FERRITIN --    Studies/Results: Dg Chest 2 View  11/06/2011  *RADIOLOGY REPORT*  Clinical Data: Follow up cavitary right upper lobe pneumonia.  CHEST - 2 VIEW  Comparison: Two-view chest x-ray 11/03/2011.  Portable chest x-rays dating back to 10/28/2011.  Findings: Further aeration of the cavity within the area of necrotic pneumonia in the right upper lobe since the examination 3 days ago.  No change in the airspace consolidation.  No new pulmonary parenchymal abnormalities elsewhere.  Stable severe COPD/emphysema and left upper lobe scarring.  Cardiomediastinal silhouette unremarkable, unchanged.  Generalized osteopenia again noted.  IMPRESSION: Further aeration of the cavity within the necrotic right upper lobe  pneumonia, though the airspace consolidation is unchanged since the examination 3 days ago.  This is superimposed upon severe COPD/emphysema.  No new abnormalities.   Original Report Authenticated By: Arnell Sieving, M.D.     I have reviewed the patient's current medications.  Assessment/Plan: 1 Hyponatremia pneu and drug.  Drifting down, supect not following restriction as asks for water all the time when someone arrives.  Hopefully will plateau at mild hypo.  Also tea & toast phenomenon with little solute to help with water excretion 2 Substance abuse  3 Pneu   P will s/o, cont to try to fluid restrict and if below 130, demeclocycline restart   LOS: 6 days   Jennifer Hurley L 11/08/2011,10:41 AM

## 2011-11-08 NOTE — Progress Notes (Signed)
[facility] PROGRESS NOTE  Jennifer Hurley AVW:[id number] DOB: October 24, 1955 DOA: 11/02/2011 PCP: No primary provider on file.  Assessment/Plan: Hyponatremia/most likely 2/2 to hypovolemic hyponatremia with water overload -At this moment following renal service recommendations; will continue sodium tablets, continue fluid restriction at 1L daily. Patient received sodium 131 today' plan is to restart demeclocycline if she drops below 130. Will continue encouraging PO intake. BMET in AM to follow electrolytes. -cortisol and TSH WNL. -appreciate renal assistance and recommendations  Fall/debility  multifactorial, related to patient's hyponatremia and recent infection. Patient was weak at discharge however she refused physical therapy.  -PT/OT re consulted this time; recommendations are for HHPT (will be arranged at discharged)  .CAP (community acquired pneumonia) and .Leukocytosis  -clinically Improved but with evidence of necrotizing pneumonia on admitting CXR and also follow CXR -Discussed with Id; will continue vanc and zosyn for now (broad spectrum coverage that guarantee negative microorganism and MRSA). -WBC's WNL now -CT demonstrating no abscess. After discussing results with ID; will start tx for 2 more weeks with bactrim and will repeat CT chest in 6 months.  .Hypokalemia  3.4 after repletion; will replete further. Patient with poor PO intake  .COPD  Continue Nebulizers   Continue spiriva No wheezing  Poor appetite with severe protein calorie malnutrition: -continue remeron -continue feeding supplements (ensure liquid and pudding; to keep fluid intake in 1L restriction diet).  Substance abuse -Patient denies tobacco, alcohol or any illicit drugs. Previous H&P doesn't reflect this.  -Cessation counseling provided -Continue nicotine patch  Code Status: Full Family Communication: no family at bedside Disposition Plan: Patient will like to go back home when medically  stable.   Brief narrative: 56 year old female who was just discharged from [facility] today after being treated for pneumonia. Patient states at home she was weak and fell in the bathroom. She states she did not hit her head and she did not lose consciousness. She came back to the ER. Patient does not report any worsening symptoms, no fevers, no shortness of breath, no wheezing. The patient lives alone. Labs in the ER revealed a worsened hyponatremia with a sodium of 120, with a slightly increased white blood cell count, the hospitalist was requested to admit patient.   Consultants: Nephrology ID curbside (Dr. Daiva Eves)  Antibiotics:  vanc and zosyn  HPI/Subjective: Afebrile. Feeling a lot better today. Sodium 131; reports still poor appetite.  Objective: Filed Vitals:   11/08/11 0100 11/08/11 0610 11/08/11 0806 11/08/11 1436  BP: 108/60 94/72  94/66  Pulse: 76 104  99  Temp:  98.2 F (36.8 C)  98.1 F (36.7 C)  TempSrc:  Oral  Oral  Resp:  18  18  Height:      Weight:      SpO2:  100% 100% 100%    Intake/Output Summary (Last 24 hours) at 11/08/11 1507 Last data filed at 11/08/11 1300  Gross per 24 hour  Intake   1252 ml  Output   2875 ml  Net  -1623 ml   Filed Weights   11/03/11 0455 11/03/11 0629  Weight: 35.29 kg (77 lb 12.8 oz) 35.5 kg (78 lb 4.2 oz)    Exam:   General:  NAD, denies significant shortness of breath. Afebrile  Cardiovascular: Mild tachycardia, no rubs or gallops. S1 and S2 appreciated  Respiratory: Good air movement, no wheezing or crackles; positive diffuse rhonchi (R>L)  Abdomen: Soft, nontender, nondistended, positive bowel sounds  Extremities: No edema, no cyanosis or clubbing.  Neuro: Alert, awake and oriented x3, no focal deficit appreciated. Patient with generalized weakness.  Data Reviewed: Basic Metabolic Panel:  Lab 11/08/11 9629 11/07/11 0443 11/06/11 2340 11/06/11 1657 11/06/11 0500 11/05/11 1300 11/05/11 0438  NA 131*  134* 135 125* 122* -- --  K 3.4* 3.4* -- -- 3.5 3.6 3.6  CL 94* 99 -- -- 86* 89* 89*  CO2 21 24 -- -- 22 26 23   GLUCOSE 90 87 -- -- 102* 103* 106*  BUN 4* 3* -- -- 3* 4* <3*  CREATININE 0.47* 0.42* -- -- 0.40* 0.44* 0.37*  CALCIUM 9.0 8.7 -- -- 8.4 8.4 8.6  MG 1.6 -- -- -- -- -- --  PHOS 4.4 4.0 -- -- -- -- --   Liver Function Tests:  Lab 11/08/11 0523 11/07/11 0443 11/03/11 0642  AST -- -- 18  ALT -- -- 10  ALKPHOS -- -- 71  BILITOT -- -- 0.4  PROT -- -- 5.6*  ALBUMIN 2.2* 2.1* 1.9*   CBC:  Lab 11/08/11 0523 11/06/11 0500 11/03/11 0642 11/02/11 2317  WBC 7.2 9.4 12.5* 14.6*  NEUTROABS -- -- -- --  HGB 9.7* 9.3* 9.8* 11.0*  HCT 27.2* 25.4* 27.0* 30.0*  MCV 76.6* 75.8* 75.8* 75.9*  PLT 394 357 336 367   BNP (last 3 results)  Basename 10/28/11 2052  PROBNP 133.2*   CBG:  Lab 11/02/11 0746  GLUCAP 106*    Studies: Ct Chest W Contrast  11/08/2011  *RADIOLOGY REPORT*  Clinical Data: Pneumonia.  Evaluate cavitary process.  CT CHEST WITH CONTRAST  Technique:  Multidetector CT imaging of the chest was performed following the standard protocol during bolus administration of intravenous contrast.  Contrast: 80mL OMNIPAQUE IOHEXOL 300 MG/ML  SOLN  Comparison: Plain films of 11/06/2011 and 11/03/2011.  CT of 10/28/2011.  Findings: Lung windows demonstrate severe underlying centrilobular emphysema.  Secretions along the left side trachea.  Mild nonspecific bronchial wall thickening to the right lower lobe.  Biapical pleural parenchymal scarring.  The right-sided presumed scarring has a somewhat nodular appearance on image 9.  A left lower lobe 4 mm nodule on image 34 was present in 2010, suggesting benignity. Dependent scar or atelectasis in the superior segment left lower lobe.  The extent of pneumonia within the posterior right upper lobe is decreased.  There are patchy areas of right middle lobe airspace disease as well.  The right middle lobe areas are primarily similar with slight  increase in lateral right middle lobe opacity on image 44.  Somewhat more confluent areas of cavitation within the posterior right upper lobe.  For example, well circumscribed gas component measures 2.9 x 2.9 cm on image 23.  No well-defined fluid collection identified.  Soft tissue windows demonstrate no supraclavicular adenopathy. Advanced aortic atherosclerosis.  Normal heart size with trace pericardial fluid. Multivessel coronary artery atherosclerosis. Calcified plaque is seen distal left main versus proximal LAD. Resolved right-sided pleural fluid.  No left effusion.  Small prevascular nodes are unchanged.  There is right hilar adenopathy at 1.2 cm on image 26 which is unchanged.  The esophagus is mildly dilated with a fluid level within.  Limited abdominal imaging demonstrates no significant findings.  No acute osseous abnormality.  IMPRESSION:  1.  Evolution of necrotizing pneumonia.  Overall improved right upper and less so right middle lobe airspace disease.  Increase in extra alveolar air, consistent with necrosis.  No well-defined abscess. 2.  Similar mild right hilar adenopathy.  Favored to be reactive. 3.  Severe centrilobular emphysema with probable biapical pleural parenchymal scarring.  Somewhat more nodular component at the right apex.  Consider follow-up with chest CT at 3 - 6 months to confirm resolution of the adenopathy and stability of the right apical nodularity. 4. Age advanced coronary artery atherosclerosis.  Recommend assessment of coronary risk factors and consideration of medical therapy. 5.  Resolution of small right pleural effusion.   Original Report Authenticated By: Consuello Bossier, M.D.     Scheduled Meds:    . albuterol  2.5 mg Nebulization TID  . antiseptic oral rinse  15 mL Mouth Rinse BID  . feeding supplement  237 mL Oral BID BM  . feeding supplement  1 Container Oral TID BM  . heparin  5,000 Units Subcutaneous Q8H  . mirtazapine  7.5 mg Oral QHS  . nicotine  14 mg  Transdermal Daily  . potassium chloride  40 mEq Oral Q4H  . potassium chloride  40 mEq Oral Q4H  . sodium chloride  2 g Oral BID WC  . sulfamethoxazole-trimethoprim  1 tablet Oral Q12H  . tiotropium  18 mcg Inhalation Daily  . DISCONTD: piperacillin-tazobactam (ZOSYN)  IV  3.375 g Intravenous Q8H  . DISCONTD: vancomycin  500 mg Intravenous Q8H    Time spent: > 30 minutes    Nickson Middlesworth  [facility] Pager [phone]. If 8PM-8AM, please contact night-coverage at [email or web address], password [facility] 11/08/2011, 3:07 PM  LOS: 6 days

## 2011-11-09 LAB — BASIC METABOLIC PANEL
Calcium: 8.8 mg/dL (ref 8.4–10.5)
Creatinine, Ser: 0.41 mg/dL — ABNORMAL LOW (ref 0.50–1.10)
GFR calc Af Amer: >90 mL/min (ref >90)

## 2011-11-09 MED ORDER — SULFAMETHOXAZOLE-TMP DS 800-160 MG PO TABS
1.0000 | ORAL_TABLET | Freq: Two times a day (BID) | ORAL | Status: DC
  Administered 2011-11-09 – 2011-11-12 (×7): 1 via ORAL
  Filled 2011-11-09 (×9): qty 1

## 2011-11-09 MED ORDER — FUROSEMIDE 20 MG PO TABS
20.0000 mg | ORAL_TABLET | Freq: Two times a day (BID) | ORAL | Status: DC
  Administered 2011-11-09 – 2011-11-10 (×2): 20 mg via ORAL
  Filled 2011-11-09 (×4): qty 1

## 2011-11-09 MED ORDER — DEMECLOCYCLINE HCL 150 MG PO TABS
300.0000 mg | ORAL_TABLET | Freq: Two times a day (BID) | ORAL | Status: DC
  Administered 2011-11-09 – 2011-11-13 (×9): 300 mg via ORAL
  Filled 2011-11-09 (×10): qty 2

## 2011-11-09 NOTE — Progress Notes (Signed)
[facility] PROGRESS NOTE  MIESHA BACHMANN ZOX:[id number] DOB: 1955/11/11 DOA: 11/02/2011 PCP: No primary provider on file.  Assessment/Plan: Hyponatremia/most likely 2/2 to hypovolemic hyponatremia with water overload -At this moment following renal service recommendations; will continue sodium tablets, continue fluid restriction at 1L daily. Patient recent sodium 127; per renal service start lasix and demeclocycline  Continue encouraging PO intake. BMET in AM to follow electrolytes. -cortisol and TSH WNL. -Will repeat random urine sodium and urine osmolarity   Fall/debility  multifactorial, related to patient's hyponatremia and recent infection. Patient was weak at discharge however she refused physical therapy.  -PT/OT re consulted this time; recommendations are for HHPT (will be arranged at discharged)  .CAP (community acquired pneumonia) and .Leukocytosis  -clinically Improved but with evidence of necrotizing pneumonia on admitting CXR and also follow CXR -Discussed with Id; will continue vanc and zosyn for now (broad spectrum coverage that guarantee negative microorganism and MRSA). -WBC's WNL now -CT demonstrating no abscess. After discussing results with ID; plan is to treat for 14 more days using bactrim BID   COPD Continue Nebulizers   Continue spiriva No wheezing and with good O2 sat  Poor appetite with severe protein calorie malnutrition: -continue remeron -continue feeding supplements (ensure liquid and pudding; to keep fluid intake in 1L restriction diet).  Substance abuse -Patient denies tobacco, alcohol or any illicit drugs. Previous H&P doesn't reflect this.  -Cessation counseling provided -Continue nicotine patch  Code Status: Full Family Communication: no family at bedside Disposition Plan: Patient will like to go back home when medically stable.   Brief narrative: 56 year old female who was just discharged from [facility] today after being treated  for pneumonia. Patient states at home she was weak and fell in the bathroom. She states she did not hit her head and she did not lose consciousness. She came back to the ER. Patient does not report any worsening symptoms, no fevers, no shortness of breath, no wheezing. The patient lives alone. Labs in the ER revealed a worsened hyponatremia with a sodium of 120, with a slightly increased white blood cell count, the hospitalist was requested to admit patient.   Consultants: Nephrology ID curbside (Dr. Daiva Eves)  Antibiotics:  vanc and zosyn 9/23 - 9/29)  Bactrim DS 9-30 (plan is to treat for 2 weeks)  HPI/Subjective: Afebrile. Feeling ok. Sodium down to 127; reports slight improved in PO intake.  Objective: Filed Vitals:   11/08/11 2050 11/08/11 2237 11/09/11 0527 11/09/11 0750  BP:  111/78 86/62   Pulse:  113 106   Temp:  98.3 F (36.8 C) 98.1 F (36.7 C)   TempSrc:  Oral Oral   Resp:  20 18   Height:      Weight:      SpO2: 99% 98% 100% 99%    Intake/Output Summary (Last 24 hours) at 11/09/11 1031 Last data filed at 11/09/11 0857  Gross per 24 hour  Intake    360 ml  Output   1725 ml  Net  -1365 ml   Filed Weights   11/03/11 0455 11/03/11 0629  Weight: 35.29 kg (77 lb 12.8 oz) 35.5 kg (78 lb 4.2 oz)    Exam:   General:  NAD, denies significant shortness of breath. Afebrile  Cardiovascular: Mild tachycardia, no rubs or gallops. S1 and S2 appreciated  Respiratory: Good air movement, no wheezing or crackles; positive diffuse rhonchi (R>L)  Abdomen: Soft, nontender, nondistended, positive bowel sounds  Extremities: No edema, no cyanosis or clubbing.  Neuro: Alert, awake and oriented x3, no focal deficit appreciated. Patient with generalized weakness.  Data Reviewed: Basic Metabolic Panel:  Lab 11/09/11 7846 11/08/11 0523 11/07/11 0443 11/06/11 2340 11/06/11 1657 11/06/11 0500 11/05/11 1300  NA 127* 131* 134* 135 125* -- --  K 4.4 3.4* 3.4* -- -- 3.5 3.6  CL  95* 94* 99 -- -- 86* 89*  CO2 22 21 24  -- -- 22 26  GLUCOSE 76 90 87 -- -- 102* 103*  BUN 3* 4* 3* -- -- 3* 4*  CREATININE 0.41* 0.47* 0.42* -- -- 0.40* 0.44*  CALCIUM 8.8 9.0 8.7 -- -- 8.4 8.4  MG -- 1.6 -- -- -- -- --  PHOS -- 4.4 4.0 -- -- -- --   Liver Function Tests:  Lab 11/08/11 0523 11/07/11 0443 11/03/11 0642  AST -- -- 18  ALT -- -- 10  ALKPHOS -- -- 71  BILITOT -- -- 0.4  PROT -- -- 5.6*  ALBUMIN 2.2* 2.1* 1.9*   CBC:  Lab 11/08/11 0523 11/06/11 0500 11/03/11 0642 11/02/11 2317  WBC 7.2 9.4 12.5* 14.6*  NEUTROABS -- -- -- --  HGB 9.7* 9.3* 9.8* 11.0*  HCT 27.2* 25.4* 27.0* 30.0*  MCV 76.6* 75.8* 75.8* 75.9*  PLT 394 357 336 367   BNP (last 3 results)  Basename 10/28/11 2052  PROBNP 133.2*   CBG: No results found for this basename: GLUCAP:5 in the last 168 hours  Studies: Ct Chest W Contrast  11/08/2011  *RADIOLOGY REPORT*  Clinical Data: Pneumonia.  Evaluate cavitary process.  CT CHEST WITH CONTRAST  Technique:  Multidetector CT imaging of the chest was performed following the standard protocol during bolus administration of intravenous contrast.  Contrast: 80mL OMNIPAQUE IOHEXOL 300 MG/ML  SOLN  Comparison: Plain films of 11/06/2011 and 11/03/2011.  CT of 10/28/2011.  Findings: Lung windows demonstrate severe underlying centrilobular emphysema.  Secretions along the left side trachea.  Mild nonspecific bronchial wall thickening to the right lower lobe.  Biapical pleural parenchymal scarring.  The right-sided presumed scarring has a somewhat nodular appearance on image 9.  A left lower lobe 4 mm nodule on image 34 was present in 2010, suggesting benignity. Dependent scar or atelectasis in the superior segment left lower lobe.  The extent of pneumonia within the posterior right upper lobe is decreased.  There are patchy areas of right middle lobe airspace disease as well.  The right middle lobe areas are primarily similar with slight increase in lateral right middle  lobe opacity on image 44.  Somewhat more confluent areas of cavitation within the posterior right upper lobe.  For example, well circumscribed gas component measures 2.9 x 2.9 cm on image 23.  No well-defined fluid collection identified.  Soft tissue windows demonstrate no supraclavicular adenopathy. Advanced aortic atherosclerosis.  Normal heart size with trace pericardial fluid. Multivessel coronary artery atherosclerosis. Calcified plaque is seen distal left main versus proximal LAD. Resolved right-sided pleural fluid.  No left effusion.  Small prevascular nodes are unchanged.  There is right hilar adenopathy at 1.2 cm on image 26 which is unchanged.  The esophagus is mildly dilated with a fluid level within.  Limited abdominal imaging demonstrates no significant findings.  No acute osseous abnormality.  IMPRESSION:  1.  Evolution of necrotizing pneumonia.  Overall improved right upper and less so right middle lobe airspace disease.  Increase in extra alveolar air, consistent with necrosis.  No well-defined abscess. 2.  Similar mild right hilar adenopathy.  Favored to be  reactive. 3.  Severe centrilobular emphysema with probable biapical pleural parenchymal scarring.  Somewhat more nodular component at the right apex.  Consider follow-up with chest CT at 3 - 6 months to confirm resolution of the adenopathy and stability of the right apical nodularity. 4. Age advanced coronary artery atherosclerosis.  Recommend assessment of coronary risk factors and consideration of medical therapy. 5.  Resolution of small right pleural effusion.   Original Report Authenticated By: Consuello Bossier, M.D.     Scheduled Meds:    . albuterol  2.5 mg Nebulization TID  . antiseptic oral rinse  15 mL Mouth Rinse BID  . demeclocycline  300 mg Oral Q12H  . feeding supplement  237 mL Oral BID BM  . feeding supplement  1 Container Oral TID BM  . furosemide  20 mg Oral BID  . heparin  5,000 Units Subcutaneous Q8H  . mirtazapine   7.5 mg Oral QHS  . nicotine  14 mg Transdermal Daily  . potassium chloride  40 mEq Oral Q4H  . sodium chloride  2 g Oral BID WC  . sulfamethoxazole-trimethoprim  1 tablet Oral Q12H  . tiotropium  18 mcg Inhalation Daily  . DISCONTD: piperacillin-tazobactam (ZOSYN)  IV  3.375 g Intravenous Q8H  . DISCONTD: vancomycin  500 mg Intravenous Q8H    Time spent: > 30 minutes    Kaeo Jacome  [facility] Pager [phone]. If 8PM-8AM, please contact night-coverage at [email or web address], password [facility] 11/09/2011, 10:31 AM  LOS: 7 days

## 2011-11-09 NOTE — Progress Notes (Signed)
PT NOTE-- at 1100 pt requested PT return in 30 minutes. At. 1100 pt. Refused to ambulate citing that her meal was on the way. Encouraged pt that mel was not there and walking would take less than 5 minutes. Pt. Still declined.Blanchard Kelch [phone]

## 2011-11-10 DIAGNOSIS — F1910 Other psychoactive substance abuse, uncomplicated: Secondary | ICD-10-CM

## 2011-11-10 LAB — BASIC METABOLIC PANEL
BUN: 4 mg/dL — ABNORMAL LOW (ref 6–23)
Chloride: 91 mEq/L — ABNORMAL LOW (ref 96–112)
Creatinine, Ser: 0.47 mg/dL — ABNORMAL LOW (ref 0.50–1.10)
GFR calc Af Amer: >90 mL/min (ref >90)
Glucose, Bld: 102 mg/dL — ABNORMAL HIGH (ref 70–99)

## 2011-11-10 LAB — SODIUM, URINE, RANDOM: Sodium, Ur: 124 mEq/L

## 2011-11-10 MED ORDER — ALBUTEROL SULFATE (5 MG/ML) 0.5% IN NEBU
2.5000 mg | INHALATION_SOLUTION | Freq: Two times a day (BID) | RESPIRATORY_TRACT | Status: DC
  Administered 2011-11-10 – 2011-11-13 (×7): 2.5 mg via RESPIRATORY_TRACT
  Filled 2011-11-10 (×7): qty 0.5

## 2011-11-10 MED ORDER — ENSURE COMPLETE PO LIQD: 237.0000 mL | ORAL | Status: DC

## 2011-11-10 MED ORDER — FUROSEMIDE 40 MG PO TABS
40.0000 mg | ORAL_TABLET | Freq: Two times a day (BID) | ORAL | Status: DC
  Administered 2011-11-10 – 2011-11-13 (×6): 40 mg via ORAL
  Filled 2011-11-10 (×10): qty 1

## 2011-11-10 MED ORDER — ALBUTEROL SULFATE HFA 108 (90 BASE) MCG/ACT IN AERS
2.0000 | INHALATION_SPRAY | RESPIRATORY_TRACT | Status: DC | PRN
  Filled 2011-11-10: qty 6.7

## 2011-11-10 MED ORDER — FUROSEMIDE 10 MG/ML IJ SOLN
40.0000 mg | Freq: Once | INTRAMUSCULAR | Status: AC
  Administered 2011-11-10: 40 mg via INTRAVENOUS
  Filled 2011-11-10: qty 4

## 2011-11-10 MED ORDER — ENSURE PUDDING PO PUDG
1.0000 | Freq: Three times a day (TID) | ORAL | Status: DC
  Filled 2011-11-10 (×6): qty 1

## 2011-11-10 NOTE — Progress Notes (Signed)
Pt noted on 1 liter fluid restriction in a 24 hour period.  Water pitcher emptied and removed from patient. 24 hour fluid calculation totaled: 942 ml . Will follow up with day shift nurse to monitor  Po intake and record.

## 2011-11-10 NOTE — Progress Notes (Signed)
[facility] PROGRESS NOTE  Jennifer Hurley:[id number] DOB: [birth year] DOA: 11/02/2011 PCP: No primary provider on file.  Assessment/Plan: Hyponatremia/most likely 2/2 to hypovolemic hyponatremia with water overload -At this moment following renal service recommendations; will continue sodium tablets, continue fluid restriction at 1L daily. Patient recent sodium 123; per renal service continue lasix (higher dose) and also demeclocycline; strict fluid restriction. BMET in AM to follow electrolytes. -cortisol and TSH WNL. -repeated random urine sodium 124 and urine osmolarity 295  Fall/debility  multifactorial, related to patient's hyponatremia and recent infection. Patient was weak at discharge however she refused physical therapy.  -PT/OT re consulted this time; recommendations are for HHPT (will be arranged at discharged) -will also need a rolling walker  .CAP (community acquired pneumonia) and .Leukocytosis  -clinically Improved but with evidence of necrotizing pneumonia on admitting CXR and also follow CXR -Discussed with Id; will continue vanc and zosyn for now (broad spectrum coverage that guarantee negative microorganism and MRSA). -WBC's WNL now -CT demonstrating no abscess. After discussing results with ID; plan is to treat for 14 more days using bactrim BID; currently on day 2/14  COPD Continue Nebulizers   Continue spiriva No wheezing and with good O2 sat  Poor appetite with severe protein calorie malnutrition: -continue remeron -continue feeding supplements (ensure liquid and pudding; to keep fluid intake in 1L restriction diet).  Substance abuse -Patient denies tobacco, alcohol or any illicit drugs. Previous H&P doesn't reflect this.  -Cessation counseling provided -Continue nicotine patch  Code Status: Full Family Communication: no family at bedside Disposition Plan: Patient will like to go back home when medically stable.   Brief narrative: 56 year old  female who was just discharged from [facility] today after being treated for pneumonia. Patient states at home she was weak and fell in the bathroom. She states she did not hit her head and she did not lose consciousness. She came back to the ER. Patient does not report any worsening symptoms, no fevers, no shortness of breath, no wheezing. The patient lives alone. Labs in the ER revealed a worsened hyponatremia with a sodium of 120, with a slightly increased white blood cell count, the hospitalist was requested to admit patient.   Consultants: Nephrology ID curbside (Dr. Daiva Eves)  Antibiotics:  vanc and zosyn 9/23 - 9/29)  Bactrim DS 9-30 (plan is to treat for 2 weeks)  HPI/Subjective: Afebrile. Feeling ok. Sodium down to 123; reports slight improved in PO intake. Patient received water and more fluids that the one allowed on her fluid restriction diet.   Objective: Filed Vitals:   11/10/11 0513 11/10/11 0815 11/10/11 0925 11/10/11 1534  BP: 92/66 106/53  95/64  Pulse: 72 92  94  Temp: 97.7 F (36.5 C) 98 F (36.7 C)  97.8 F (36.6 C)  TempSrc: Oral Oral  Oral  Resp: 18 18  18   Height:      Weight:      SpO2: 99% 97% 98% 96%    Intake/Output Summary (Last 24 hours) at 11/10/11 1718 Last data filed at 11/10/11 1713  Gross per 24 hour  Intake   1182 ml  Output   1725 ml  Net   -543 ml   Filed Weights   11/03/11 0455 11/03/11 0629  Weight: 35.29 kg (77 lb 12.8 oz) 35.5 kg (78 lb 4.2 oz)    Exam:   General:  NAD, denies significant shortness of breath and cough. Afebrile  Cardiovascular: regular rate, no rubs or gallops. S1 and  S2 appreciated  Respiratory: Good air movement, no wheezing or crackles; positive diffuse rhonchi (R>L)  Abdomen: Soft, nontender, nondistended, positive bowel sounds  Extremities: No edema, no cyanosis or clubbing.  Neuro: Alert, awake and oriented x3, no focal deficit appreciated. Patient with generalized weakness.  Data  Reviewed: Basic Metabolic Panel:  Lab 11/10/11 1324 11/09/11 0453 11/08/11 0523 11/07/11 0443 11/06/11 2340 11/06/11 0500  NA 123* 127* 131* 134* 135 --  K 3.8 4.4 3.4* 3.4* -- 3.5  CL 91* 95* 94* 99 -- 86*  CO2 17* 22 21 24  -- 22  GLUCOSE 102* 76 90 87 -- 102*  BUN 4* 3* 4* 3* -- 3*  CREATININE 0.47* 0.41* 0.47* 0.42* -- 0.40*  CALCIUM 8.8 8.8 9.0 8.7 -- 8.4  MG -- -- 1.6 -- -- --  PHOS -- -- 4.4 4.0 -- --   Liver Function Tests:  Lab 11/08/11 0523 11/07/11 0443  AST -- --  ALT -- --  ALKPHOS -- --  BILITOT -- --  PROT -- --  ALBUMIN 2.2* 2.1*   CBC:  Lab 11/08/11 0523 11/06/11 0500  WBC 7.2 9.4  NEUTROABS -- --  HGB 9.7* 9.3*  HCT 27.2* 25.4*  MCV 76.6* 75.8*  PLT 394 357   BNP (last 3 results)  Basename 10/28/11 2052  PROBNP 133.2*   Studies: No results found.  Scheduled Meds:    . albuterol  2.5 mg Nebulization BID  . antiseptic oral rinse  15 mL Mouth Rinse BID  . demeclocycline  300 mg Oral Q12H  . feeding supplement  237 mL Oral BID BM  . feeding supplement  1 Container Oral TID BM  . furosemide  40 mg Intravenous Once  . furosemide  40 mg Oral BID  . heparin  5,000 Units Subcutaneous Q8H  . mirtazapine  7.5 mg Oral QHS  . nicotine  14 mg Transdermal Daily  . sodium chloride  2 g Oral BID WC  . sulfamethoxazole-trimethoprim  1 tablet Oral Q12H  . tiotropium  18 mcg Inhalation Daily  . DISCONTD: albuterol  2.5 mg Nebulization TID  . DISCONTD: furosemide  20 mg Oral BID    Time spent: > 30 minutes    Jennifer Hurley  [facility] Pager [phone]. If 8PM-8AM, please contact night-coverage at [email or web address], password [facility] 11/10/2011, 5:18 PM  LOS: 8 days

## 2011-11-10 NOTE — Progress Notes (Signed)
Physical Therapy Treatment Patient Details Name: Jennifer Hurley MRN: [id number] DOB: 09/06/1955 Today's Date: 11/10/2011 Time: 4098-1191 PT Time Calculation (min): 18 min  PT Assessment / Plan / Recommendation Comments on Treatment Session  Pt states she feels better but still c/o of dizzyness and difficulty walking with limited activity tolerance requiring freq rest breaks.    Follow Up Recommendations  Home health PT    Barriers to Discharge        Equipment Recommendations  Rolling walker with 5" wheels;3 in 1 bedside comode    Recommendations for Other Services    Frequency Min 3X/week   Plan Discharge plan remains appropriate    Precautions / Restrictions Restrictions Weight Bearing Restrictions: No    Pertinent Vitals/Pain No c/o pain    Mobility  Bed Mobility Bed Mobility: Supine to Sit;Sitting - Scoot to Edge of Bed Supine to Sit: 7: Independent Sitting - Scoot to [place] of Bed: 7: Independent  Transfers Transfers: Sit to Stand;Stand to Sit Sit to Stand: 6: Modified independent (Device/Increase time) Stand to Sit: 6: Modified independent (Device/Increase time)  Ambulation/Gait Ambulation/Gait Assistance: 4: Min assist Ambulation Distance (Feet): 185 Feet Assistive device: None Ambulation/Gait Assistance Details: amb pt w/o AD to test balance and self righting reaction.  Pt demon mod unsteady, drunken gait tending to steady self using rails,funiture,walls and demon limited activity tolerance as demon 2/4 DOE requiring x 4 standing rest breaks "to catch my breath". Gait Pattern: Step-through pattern Gait velocity: decreased    PT Goals   progressing    Visit Information  Last PT Received On: 11/10/11 Assistance Needed: +1                   End of Session PT - End of Session Equipment Utilized During Treatment: Gait belt Activity Tolerance: Patient limited by fatigue Patient left: in bed;with call bell/phone within reach  Felecia Shelling  PTA [facility]  Acute   Rehab Pager     [phone]

## 2011-11-10 NOTE — Progress Notes (Signed)
Thank you to Dr. Vassie Loll for this referral.  Chart Review Complete.  Patient does not current have a [facility] PCP as Dr Duanne Guess is not currently listed.  Patient has [employer].  Encouraged referring MD to  contact [facility] to determine if she is eligible for their services.   For any additional questions or new referrals please contact Anibal Henderson BSN RN [facility] Liaison at [phone].

## 2011-11-11 LAB — BASIC METABOLIC PANEL
BUN: 6 mg/dL (ref 6–23)
CO2: 22 mEq/L (ref 19–32)
Calcium: 8.9 mg/dL (ref 8.4–10.5)
Creatinine, Ser: 0.54 mg/dL (ref 0.50–1.10)

## 2011-11-11 MED ORDER — POTASSIUM CHLORIDE CRYS ER 20 MEQ PO TBCR
40.0000 meq | EXTENDED_RELEASE_TABLET | Freq: Two times a day (BID) | ORAL | Status: AC
  Administered 2011-11-11 – 2011-11-12 (×2): 40 meq via ORAL
  Filled 2011-11-11 (×2): qty 2

## 2011-11-11 MED ORDER — ENSURE COMPLETE PO LIQD: 237.0000 mL | Freq: Two times a day (BID) | ORAL | Status: DC

## 2011-11-11 NOTE — Progress Notes (Signed)
[facility] PROGRESS NOTE  Jennifer Hurley:[id number] DOB: 22-Jan-1956 DOA: 11/02/2011 PCP: No primary provider on file.  Assessment/Plan: 1. Hyponatremia--thought to be secondary to water overload and sodium depletion initially. Improved somewhat with NS and fluid restriction and then recurred. Started on salt tabs by nephrology. Cortisol and TSH normal. Tolvaptan caused rapid improvement, was stopped by nephrology and sodium dropped. Nephrology recommended fluid restriction and demeclocyine.  2. Hypokalemia--replete. 3. Normocytic anemia--stable. 4. CAP/necrotizing pneumonia--Stable. Dr. Gwenlyn Perking discussed with ID--plan total 2 weeks with Bactrim and repeat CT chest in 6 months 5. Fall --multifactorial, related to patient's hyponatremia and recent infection. Patient refuses SNF. 6. COPD--stable. 7. History of reported substance abuse 8. Severe malnutrition in context of acute illness  Code Status: Full code Family Communication: none present Disposition Plan: HHPT  Jennifer Sacks, MD  [facility] Team 6 Pager [phone]. If 8PM-8AM, please contact night-coverage at [email or web address], password [facility] 11/11/2011, 7:04 PM  LOS: 9 days   Brief narrative: 56 year old female who was just discharged from [facility] today after being treated for pneumonia. Patient does not report any worsening symptoms, no fevers, no shortness of breath, no wheezing. Labs in the ER revealed a worsened hyponatremia with a sodium of 120, with a slightly increased white blood cell count, the hospitalist was requested to admit patient.   Consultants:  Nephrology  PT--HH PT, rolling walker with 5" wheels;3 in 1 bedside comode   Procedures:    Antibiotics:  Bactrim 9/30 >> 10/13.  HPI/Subjective: Afebrile, vitals stable. No complaints.  Objective: Filed Vitals:   11/10/11 2138 11/11/11 0700 11/11/11 0754 11/11/11 1452  BP: 105/75 100/73  93/75  Pulse: 82 78  111  Temp: 98.5 F (36.9 C)  97.7 F (36.5 C)  98.3 F (36.8 C)  TempSrc: Oral Oral  Oral  Resp: 20 18  20   Height:      Weight:      SpO2: 99% 97% 98% 100%    Intake/Output Summary (Last 24 hours) at 11/11/11 1904 Last data filed at 11/11/11 1800  Gross per 24 hour  Intake    587 ml  Output    300 ml  Net    287 ml   Filed Weights   11/03/11 0455 11/03/11 0629  Weight: 35.29 kg (77 lb 12.8 oz) 35.5 kg (78 lb 4.2 oz)    Exam:  General:  Appears calm and comfortable Cardiovascular: RRR, no m/r/g. No LE edema. Respiratory: CTA bilaterally, no w/r/r. Normal respiratory effort. Psychiatric: grossly normal mood and affect, speech fluent and appropriate  Data Reviewed: Basic Metabolic Panel:  Lab 11/11/11 8657 11/10/11 0435 11/09/11 0453 11/08/11 0523 11/07/11 0443  NA 126* 123* 127* 131* 134*  K 3.2* 3.8 4.4 3.4* 3.4*  CL 89* 91* 95* 94* 99  CO2 22 17* 22 21 24   GLUCOSE 95 102* 76 90 87  BUN 6 4* 3* 4* 3*  CREATININE 0.54 0.47* 0.41* 0.47* 0.42*  CALCIUM 8.9 8.8 8.8 9.0 8.7  MG -- -- -- 1.6 --  PHOS -- -- -- 4.4 4.0   Liver Function Tests:  Lab 11/08/11 0523 11/07/11 0443  AST -- --  ALT -- --  ALKPHOS -- --  BILITOT -- --  PROT -- --  ALBUMIN 2.2* 2.1*   CBC:  Lab 11/08/11 0523 11/06/11 0500  WBC 7.2 9.4  NEUTROABS -- --  HGB 9.7* 9.3*  HCT 27.2* 25.4*  MCV 76.6* 75.8*  PLT 394 357    Basename 10/28/11 2052  PROBNP 133.2*   Studies: No results found.  Scheduled Meds:   . albuterol  2.5 mg Nebulization BID  . antiseptic oral rinse  15 mL Mouth Rinse BID  . demeclocycline  300 mg Oral Q12H  . feeding supplement  237 mL Oral BID BM  . furosemide  40 mg Oral BID  . heparin  5,000 Units Subcutaneous Q8H  . mirtazapine  7.5 mg Oral QHS  . nicotine  14 mg Transdermal Daily  . sulfamethoxazole-trimethoprim  1 tablet Oral Q12H  . tiotropium  18 mcg Inhalation Daily  . DISCONTD: feeding supplement  237 mL Oral Q24H  . DISCONTD: feeding supplement  1 Container Oral TID WC & HS   . DISCONTD: sodium chloride  2 g Oral BID WC   Continuous Infusions:   Active Problems:  COPD  Leukocytosis  Hyponatremia  Hypokalemia  CAP (community acquired pneumonia)     Jennifer Sacks, MD  [facility] Team 6 Pager [phone]. If 8PM-8AM, please contact night-coverage at [email or web address], password [facility] 11/11/2011, 7:04 PM  LOS: 9 days   Time spent: 35 minutes

## 2011-11-11 NOTE — Plan of Care (Signed)
Problem: Phase I Progression Outcomes Goal: Initial discharge plan identified Outcome: Completed/Met Date Met:  11/11/11 Home with [facility]

## 2011-11-11 NOTE — Progress Notes (Addendum)
Nutrition Follow-up  Intervention:  D/C Ensure Pudding.  Increase Ensure Complete to bid and stop sending fluids on the trays. Pt severely malnourished and best to drink the Ensure than soda, tea, or water.  Assessment:   Continues with problems of hyponatremia.  RD paged by RN and requested to d/c Ensure Complete to avoid going over fluid restriction.  Pt states she dislikes Ensure Pudding.  Addiment about going home to pay her bills. Believe pt is getting water in her room.  Diet Order:  Heart Healthy with 1000 ml Fluid Restriction, Ensure Complete once daily, Ensure pudding tid  Meds: Scheduled Meds:   . albuterol  2.5 mg Nebulization BID  . antiseptic oral rinse  15 mL Mouth Rinse BID  . demeclocycline  300 mg Oral Q12H  . feeding supplement  237 mL Oral Q24H  . feeding supplement  1 Container Oral TID WC & HS  . furosemide  40 mg Intravenous Once  . furosemide  40 mg Oral BID  . heparin  5,000 Units Subcutaneous Q8H  . mirtazapine  7.5 mg Oral QHS  . nicotine  14 mg Transdermal Daily  . sulfamethoxazole-trimethoprim  1 tablet Oral Q12H  . tiotropium  18 mcg Inhalation Daily  . DISCONTD: feeding supplement  237 mL Oral BID BM  . DISCONTD: feeding supplement  1 Container Oral TID BM  . DISCONTD: furosemide  20 mg Oral BID  . DISCONTD: sodium chloride  2 g Oral BID WC   Continuous Infusions:  PRN Meds:.acetaminophen, acetaminophen, albuterol, albuterol, alum & mag hydroxide-simeth, guaiFENesin-dextromethorphan, HYDROcodone-acetaminophen, HYDROcodone-homatropine, ipratropium, magic mouthwash, ondansetron (ZOFRAN) IV, ondansetron, senna-docusate  Labs:  CMP     Component Value Date/Time   NA 126* 11/11/2011 0520   K 3.2* 11/11/2011 0520   CL 89* 11/11/2011 0520   CO2 22 11/11/2011 0520   GLUCOSE 95 11/11/2011 0520   BUN 6 11/11/2011 0520   CREATININE 0.54 11/11/2011 0520   CALCIUM 8.9 11/11/2011 0520   PROT 5.6* 11/03/2011 0642   ALBUMIN 2.2* 11/08/2011 0523   AST 18 11/03/2011  0642   ALT 10 11/03/2011 0642   ALKPHOS 71 11/03/2011 0642   BILITOT 0.4 11/03/2011 0642   GFRNONAA >90 11/11/2011 0520   GFRAA >90 11/11/2011 0520     Intake/Output Summary (Last 24 hours) at 11/11/11 1332 Last data filed at 11/11/11 0708  Gross per 24 hour  Intake    487 ml  Output   1400 ml  Net   -913 ml    Weight Status:  35.5 kg-stable  Re-estimated needs:  1350-1450 kcals, 55-65 gm protein  Nutrition Dx:  Malnutrition- ongoing  Goal:  Intake of meals and supplements to prevent further weight loss.  Monitor:  Intake, labs, weight   Oran Rein, RD, LDN Clinical Inpatient Dietitian Pager:  [phone] Weekend and after hours pager:  [phone]

## 2011-11-11 NOTE — Progress Notes (Signed)
Patient is active with [facility] for HHPT/ RN as prior to admission; patient does not want to go to a SNF; B Sondi Desch RN,BSN,MHA.

## 2011-11-12 LAB — BASIC METABOLIC PANEL
BUN: 9 mg/dL (ref 6–23)
Calcium: 8.8 mg/dL (ref 8.4–10.5)
Creatinine, Ser: 0.64 mg/dL (ref 0.50–1.10)
GFR calc non Af Amer: >90 mL/min (ref >90)
Glucose, Bld: 88 mg/dL (ref 70–99)
Sodium: 125 mEq/L — ABNORMAL LOW (ref 135–145)

## 2011-11-12 NOTE — Progress Notes (Signed)
Physical Therapy Treatment Patient Details Name: Jennifer Hurley MRN: [id number] DOB: 05-07-1955 Today's Date: 11/12/2011 Time: 1400-1410 PT Time Calculation (min): 10 min  PT Assessment / Plan / Recommendation Comments on Treatment Session  Pt. continues with variable performance/endurance. Pt. presents with very little endurance. Has been more participatory recently.    Follow Up Recommendations  Home health PT    Barriers to Discharge        Equipment Recommendations  None recommended by PT (pt states she does not need.)    Recommendations for Other Services    Frequency Min 3X/week   Plan Discharge plan remains appropriate;Frequency remains appropriate    Precautions / Restrictions Precautions Precautions: Fall Precaution Comments: fluid restriction   Pertinent Vitals/Pain  pt. With dyspnea when ambulting. Stops frequently for  Rest. sats 92% RA. HR 89    Mobility  Bed Mobility Supine to Sit: 7: Independent Sitting - Scoot to Edge of Bed: 7: Independent Sit to Supine: 7: Independent Transfers Sit to Stand: 6: Modified independent (Device/Increase time) Stand to Sit: 6: Modified independent (Device/Increase time) Ambulation/Gait Ambulation/Gait Assistance: 4: Min assist Ambulation Distance (Feet): 100 Feet Assistive device: 1 person hand held assist Ambulation/Gait Assistance Details: pt. observed up ad lib in room, holds to bed, chair. Pt. ambulated with no AD but 1 handhold. Pt stopped x 4 during short walk for rest break stating that she was dizzy and very tired. Gait Pattern: Step-through pattern;Decreased stride length Gait velocity: decreased General Gait Details: pt with limited endurance for ambulation today.    Exercises Other Exercises Other Exercises: instructed in LAQ, seated hip flexion and hip rotation seated. Pt. did not perform as she declined.   PT Diagnosis:    PT Problem List:   PT Treatment Interventions:     PT Goals Acute Rehab PT  Goals Pt will go Sit to Stand: Independently PT Goal: Sit to Stand - Progress: Met Pt will go Stand to Sit: with modified independence PT Goal: Stand to Sit - Progress: Met Pt will Ambulate: 51 - 150 feet;with supervision;with least restrictive assistive device PT Goal: Ambulate - Progress: Progressing toward goal  Visit Information  Last PT Received On: 11/12/11 Assistance Needed: +1    Subjective Data  Subjective: I have a HA. I am nauseated.   Cognition  Overall Cognitive Status: Appears within functional limits for tasks assessed/performed Area of Impairment: Safety/judgement Arousal/Alertness: Awake/alert Orientation Level: Appears intact for tasks assessed    Balance     End of Session PT - End of Session Activity Tolerance: Patient limited by fatigue Patient left: in bed;with call bell/phone within reach Nurse Communication: Mobility status   GP     [name] 11/12/2011, 4:30 PM

## 2011-11-12 NOTE — Progress Notes (Signed)
Patient was given resources on finding a PCP last admission, patient is agreeable to go to the [facility] but they cannot see her until the end of October. [facility] Called - unable to see the patient until the end of October. [facility] called - cannot see the patient until next year. [facility] called - they are not seeing new Medicare patients. Dr Marlan Palau office called - not seeing new patient's until the first of the year. Dr Vedia Pereyra at [facility] called - apt made for Nov 16, 2011 at 4:45pm per [facility]. Phone # [phone] [address], [place], [place] [address]  Informed patient to follow up with Dr Mikeal Hawthorne next week

## 2011-11-12 NOTE — Progress Notes (Signed)
[facility] PROGRESS NOTE  BRANIYAH BESSE XBJ:[id number] DOB: Mar 10, 1955 DOA: 11/02/2011 PCP: Lonia Blood, MD  Assessment/Plan: 1. Hyponatremia--No significant change today. Thought to be secondary to water overload and sodium depletion initially. Improved somewhat with NS and fluid restriction and then recurred. Started on salt tabs by nephrology (stopped per Dr. Caryn Section). Cortisol and TSH normal. Tolvaptan caused rapid improvement, was stopped by nephrology and sodium dropped. Nephrology recommended fluid restriction and demeclocyine. Enforcing compliance with water restriction has been challenging but intake has improved. 2. Hypokalemia--repleted. 3. Normocytic anemia--stable. 4. CAP/necrotizing pneumonia--Stable. Dr. Gwenlyn Perking discussed with ID--plan total 2 weeks with Bactrim and repeat CT chest in 6 months 5. Fall --multifactorial, related to patient's hyponatremia and recent infection. Patient refuses SNF. 6. COPD--stable. 7. History of reported substance abuse 8. Severe malnutrition in context of acute illness  Code Status: Full code Family Communication: none present Disposition Plan: HHPT, anticipate 10/4 if sodium stable with outpatient follow-up next week.  [name], MD  [facility] Team 6 Pager [phone]. If 8PM-8AM, please contact night-coverage at [email or web address], password [facility] 11/12/2011, 2:46 PM  LOS: 10 days   Brief narrative: 56 year old female who was just discharged from [facility] today after being treated for pneumonia. Patient does not report any worsening symptoms, no fevers, no shortness of breath, no wheezing. Labs in the ER revealed a worsened hyponatremia with a sodium of 120, with a slightly increased white blood cell count, the hospitalist was requested to admit patient.   Consultants:  Nephrology  PT--HH PT, rolling walker with 5" wheels;3 in 1 bedside comode   Procedures:    Antibiotics:  Bactrim 9/30 >>  10/13.  HPI/Subjective: Afebrile, vitals stable. No complaints. Feels well. Hopes to go home soon.  Objective: Filed Vitals:   11/11/11 2202 11/12/11 0514 11/12/11 1103 11/12/11 1415  BP: 100/65 94/68  104/72  Pulse: 114 104  115  Temp: 97.3 F (36.3 C) 98.2 F (36.8 C)  97.8 F (36.6 C)  TempSrc:  Oral  Oral  Resp: 18 20  20   Height:      Weight:      SpO2: 100% 100% 94% 95%    Intake/Output Summary (Last 24 hours) at 11/12/11 1446 Last data filed at 11/12/11 1417  Gross per 24 hour  Intake   1211 ml  Output    800 ml  Net    411 ml   Filed Weights   11/03/11 0455 11/03/11 0629  Weight: 35.29 kg (77 lb 12.8 oz) 35.5 kg (78 lb 4.2 oz)    Exam:  General:  Appears calm and comfortable Cardiovascular: RRR, no m/r/g. No LE edema. Respiratory: CTA bilaterally, no w/r/r. Normal respiratory effort. Psychiatric: grossly normal mood and affect  Data Reviewed: Basic Metabolic Panel:  Lab 11/12/11 4696 11/11/11 0520 11/10/11 0435 11/09/11 0453 11/08/11 0523 11/07/11 0443  NA 125* 126* 123* 127* 131* --  K 3.7 3.2* 3.8 4.4 3.4* --  CL 90* 89* 91* 95* 94* --  CO2 20 22 17* 22 21 --  GLUCOSE 88 95 102* 76 90 --  BUN 9 6 4* 3* 4* --  CREATININE 0.64 0.54 0.47* 0.41* 0.47* --  CALCIUM 8.8 8.9 8.8 8.8 9.0 --  MG -- -- -- -- 1.6 --  PHOS -- -- -- -- 4.4 4.0   Liver Function Tests:  Lab 11/08/11 0523 11/07/11 0443  AST -- --  ALT -- --  ALKPHOS -- --  BILITOT -- --  PROT -- --  ALBUMIN 2.2* 2.1*  CBC:  Lab 11/08/11 0523 11/06/11 0500  WBC 7.2 9.4  NEUTROABS -- --  HGB 9.7* 9.3*  HCT 27.2* 25.4*  MCV 76.6* 75.8*  PLT 394 357    Basename 10/28/11 2052  PROBNP 133.2*   Studies: No results found.  Scheduled Meds:    . albuterol  2.5 mg Nebulization BID  . antiseptic oral rinse  15 mL Mouth Rinse BID  . demeclocycline  300 mg Oral Q12H  . feeding supplement  237 mL Oral BID BM  . furosemide  40 mg Oral BID  . heparin  5,000 Units Subcutaneous Q8H  .  mirtazapine  7.5 mg Oral QHS  . nicotine  14 mg Transdermal Daily  . potassium chloride  40 mEq Oral BID  . sulfamethoxazole-trimethoprim  1 tablet Oral Q12H  . tiotropium  18 mcg Inhalation Daily   Continuous Infusions:   Active Problems:  COPD  Leukocytosis  Hyponatremia  Hypokalemia  CAP (community acquired pneumonia)     [name], MD  [facility] Team 6 Pager [phone]. If 8PM-8AM, please contact night-coverage at [email or web address], password [facility] 11/12/2011, 2:46 PM  LOS: 10 days   Time spent: 25 minutes

## 2011-11-13 LAB — BASIC METABOLIC PANEL
BUN: 7 mg/dL (ref 6–23)
Calcium: 9 mg/dL (ref 8.4–10.5)
Creatinine, Ser: 0.67 mg/dL (ref 0.50–1.10)
GFR calc Af Amer: >90 mL/min (ref >90)
GFR calc non Af Amer: >90 mL/min (ref >90)
Glucose, Bld: 84 mg/dL (ref 70–99)
Potassium: 3.7 mEq/L (ref 3.5–5.1)

## 2011-11-13 MED ORDER — POTASSIUM CHLORIDE ER 10 MEQ PO TBCR: 20.0000 meq | EXTENDED_RELEASE_TABLET | Freq: Every day | ORAL | Status: DC

## 2011-11-13 MED ORDER — SULFAMETHOXAZOLE-TMP DS 800-160 MG PO TABS: 1.0000 | ORAL_TABLET | Freq: Two times a day (BID) | ORAL | Status: DC

## 2011-11-13 MED ORDER — DEMECLOCYCLINE HCL 150 MG PO TABS: 300.0000 mg | ORAL_TABLET | Freq: Two times a day (BID) | ORAL | Status: DC

## 2011-11-13 MED ORDER — FUROSEMIDE 40 MG PO TABS: 40.0000 mg | ORAL_TABLET | Freq: Two times a day (BID) | ORAL | Status: DC

## 2011-11-13 MED ORDER — FLUCONAZOLE 150 MG PO TABS: 150.0000 mg | ORAL_TABLET | Freq: Once | ORAL | Status: DC

## 2011-11-13 MED ORDER — ENSURE COMPLETE PO LIQD: 237.0000 mL | Freq: Two times a day (BID) | ORAL | Status: DC

## 2011-11-13 MED ORDER — NICOTINE 14 MG/24HR TD PT24: 1.0000 | MEDICATED_PATCH | Freq: Every day | TRANSDERMAL | Status: DC

## 2011-11-13 NOTE — Progress Notes (Signed)
Physical Therapy Treatment Patient Details Name: Jennifer Hurley MRN: [id number] DOB: 01-07-1956 Today's Date: 11/13/2011 Time: 6440-3474 PT Time Calculation (min): 15 min  PT Assessment / Plan / Recommendation Comments on Treatment Session  Pt tolerated increased overall gait distance today, but requires several brief standing rest periods due to fatigue. No LOB. OK to DC home from PT standpoint.     Follow Up Recommendations  Home health PT    Barriers to Discharge        Equipment Recommendations  None recommended by PT (pt states she does not need.)    Recommendations for Other Services    Frequency Min 3X/week   Plan Discharge plan remains appropriate;Frequency remains appropriate    Precautions / Restrictions Precautions Precautions: Fall Precaution Comments: fluid restriction Restrictions Weight Bearing Restrictions: No   Pertinent Vitals/Pain **no c/o pain*    Mobility  Bed Mobility Supine to Sit: 7: Independent Sitting - Scoot to Edge of Bed: 7: Independent Sit to Supine: 7: Independent Transfers Sit to Stand: 6: Modified independent (Device/Increase time);From bed Stand to Sit: 6: Modified independent (Device/Increase time);To chair/3-in-1;With armrests Ambulation/Gait Ambulation/Gait Assistance: 5: Supervision Ambulation Distance (Feet): 150 Feet Assistive device: None Ambulation/Gait Assistance Details: 2 standing rest breaks leaning against wall or counter due to fatigue Gait Pattern: Within Functional Limits Gait velocity: decreased General Gait Details: pt with limited endurance for ambulation, no LOB    Exercises Other Exercises Other Exercises: Reviewed LAQs, but Pt. did not perform as she declined 2* fatigue   PT Diagnosis:    PT Problem List:   PT Treatment Interventions:     PT Goals Acute Rehab PT Goals PT Goal Formulation: With patient Pt will go Sit to Stand: Independently PT Goal: Sit to Stand - Progress: Met Pt will go Stand to Sit:  with modified independence PT Goal: Stand to Sit - Progress: Met Pt will Ambulate: 51 - 150 feet;with supervision;with least restrictive assistive device  Visit Information  Last PT Received On: 11/13/11 Assistance Needed: +1    Subjective Data  Subjective: I need to go home so I can pay my rent.  Is this yellow discharge from the medicine?  (notified MD and RN of vaginal discharge) Patient Stated Goal: to go home   Cognition  Overall Cognitive Status: Appears within functional limits for tasks assessed/performed Arousal/Alertness: Awake/alert Orientation Level: Appears intact for tasks assessed Behavior During Session: Jennifer Hurley for tasks performed    Balance     End of Session PT - End of Session Activity Tolerance: Patient limited by fatigue Patient left: with call bell/phone within reach;in chair Nurse Communication: Mobility status   GP     [name] 11/13/2011, 10:51 AM [phone]

## 2011-11-13 NOTE — Progress Notes (Signed)
[facility] PROGRESS NOTE  Jennifer Hurley YQM:[id number] DOB: 1955/06/08 DOA: 11/02/2011 PCP: Lonia Blood, MD  Assessment/Plan: 1. Hyponatremia--improved, continue fluid restriction and demeclocycline (discussed with patient). Thought to be secondary to water overload and sodium depletion initially. Improved somewhat with NS and fluid restriction and then recurred. Started on salt tabs by nephrology (stopped per Dr. Caryn Section). Cortisol and TSH normal. Tolvaptan caused rapid improvement, was stopped by nephrology and sodium dropped. Nephrology recommended fluid restriction and demeclocycline. Enforcing compliance with water restriction has been challenging but intake has improved. 2. Hypokalemia--repleted. 3. Normocytic anemia--stable. 4. CAP/necrotizing pneumonia--Stable. Dr. Gwenlyn Perking discussed with ID--plan total 2 weeks with Bactrim and repeat CT chest in 6 months 5. Fall --multifactorial, related to patient's hyponatremia and recent infection. Patient refuses SNF. 6. COPD--stable. 7. History of reported substance abuse 8. Severe malnutrition in context of acute illness 9. Severe centrilobular emphysema--with probable biapical pleural parenchymal scarring. Somewhat more nodular component at the right apex. Consider follow-up with chest CT at 3 - 6 months to confirm resolution of the adenopathy and stability of the right apical Nodularity. 10. Age advanced coronary artery atherosclerosis--by CT imaging. Asymptomatic. Consider assessment of coronary risk factors and consideration of medical therapy.  Complains of vaginal discharge--Diflucan, follow-up with PCP next week.  Code Status: Full code Family Communication: none present Disposition Plan: HHPT, home today  [name], MD  [facility] Team 6 Pager [phone]. If 8PM-8AM, please contact night-coverage at [email or web address], password [facility] 11/13/2011, 10:26 AM  LOS: 11 days   Brief narrative: 56 year old female who was just  discharged from [facility] today after being treated for pneumonia. Patient does not report any worsening symptoms, no fevers, no shortness of breath, no wheezing. Labs in the ER revealed a worsened hyponatremia with a sodium of 120, with a slightly increased white blood cell count, the hospitalist was requested to admit patient.   Consultants:  Nephrology  PT--HH PT  Procedures:    Antibiotics:  Bactrim 9/30 >> 10/13.  HPI/Subjective: No complaints, ready to go home.  Objective: Filed Vitals:   11/12/11 2030 11/12/11 2306 11/13/11 0633 11/13/11 0848  BP:  109/79 102/69   Pulse:  109 101   Temp:  97.6 F (36.4 C) 97.9 F (36.6 C)   TempSrc:  Axillary Oral   Resp:  20 20   Height:      Weight:      SpO2: 94% 100% 100% 96%    Intake/Output Summary (Last 24 hours) at 11/13/11 1026 Last data filed at 11/13/11 0645  Gross per 24 hour  Intake    677 ml  Output    750 ml  Net    -73 ml   Filed Weights   11/03/11 0455 11/03/11 0629  Weight: 35.29 kg (77 lb 12.8 oz) 35.5 kg (78 lb 4.2 oz)    Exam:  General:  Appears calm and comfortable, seen to ambulate with PT Cardiovascular: RRR, no m/r/g. No LE edema. Respiratory: CTA bilaterally, no w/r/r. Normal respiratory effort. Psychiatric: grossly normal mood and affect  Data Reviewed: Basic Metabolic Panel:  Lab 11/13/11 4401 11/12/11 0358 11/11/11 0520 11/10/11 0435 11/09/11 0453 11/08/11 0523 11/07/11 0443  NA 127* 125* 126* 123* 127* -- --  K 3.7 3.7 3.2* 3.8 4.4 -- --  CL 92* 90* 89* 91* 95* -- --  CO2 21 20 22  17* 22 -- --  GLUCOSE 84 88 95 102* 76 -- --  BUN 7 9 6  4* 3* -- --  CREATININE 0.67 0.64 0.54  0.47* 0.41* -- --  CALCIUM 9.0 8.8 8.9 8.8 8.8 -- --  MG -- -- -- -- -- 1.6 --  PHOS -- -- -- -- -- 4.4 4.0   Liver Function Tests:  Lab 11/08/11 0523 11/07/11 0443  AST -- --  ALT -- --  ALKPHOS -- --  BILITOT -- --  PROT -- --  ALBUMIN 2.2* 2.1*   CBC:  Lab 11/08/11 0523  WBC 7.2  NEUTROABS  --  HGB 9.7*  HCT 27.2*  MCV 76.6*  PLT 394    Basename 10/28/11 2052  PROBNP 133.2*   Studies: No results found.  Scheduled Meds:    . albuterol  2.5 mg Nebulization BID  . antiseptic oral rinse  15 mL Mouth Rinse BID  . demeclocycline  300 mg Oral Q12H  . feeding supplement  237 mL Oral BID BM  . furosemide  40 mg Oral BID  . heparin  5,000 Units Subcutaneous Q8H  . mirtazapine  7.5 mg Oral QHS  . nicotine  14 mg Transdermal Daily  . sulfamethoxazole-trimethoprim  1 tablet Oral Q12H  . tiotropium  18 mcg Inhalation Daily   Continuous Infusions:   Active Problems:  COPD  Leukocytosis  Hyponatremia  Hypokalemia  CAP (community acquired pneumonia)     [name], MD  [facility] Team 6 Pager [phone]. If 8PM-8AM, please contact night-coverage at [email or web address], password [facility] 11/13/2011, 10:26 AM  LOS: 11 days

## 2011-11-13 NOTE — Discharge Summary (Signed)
Physician Discharge Summary  Jennifer Hurley:[id number] DOB: 03-Sep-1955 DOA: 11/02/2011  PCP: Lonia Blood, MD  Admit date: 11/02/2011 Discharge date: 11/13/2011  Recommendations for Outpatient Follow-up:  1. Follow-up hyponatremia, completes demeclocycline 10/7. Suggest BMP twice per week until sodium improved/stable. 2. Follow-up resolution of necrotizing pneumonia, consider repeat chest CT 3-6 months, see below. 3. Follow-up nodular component right apex/scarring with chest CT 3-6 months.  4. Follow-up advanced coronary artery atherosclerosis seen on chest CT (see below) 5. Home health PT, no equipment recommended.  Follow-up Information    Follow up with [facility], MD. On 11/16/2011. (at 4:45)    Contact information:   [address] [place] [address] [phone]         Discharge Diagnoses:  1. Hyponatremia 2. Hypokalemia 3. Normocytic anemia 4. CAP/necrotizing pneumonia 5. Fall 6. COPD 7. History of reported substance abuse  8. Severe malnutrition in context of acute illness  9. Severe centrilobular emphysema 10. Age advanced coronary artery atherosclerosis  Discharge Condition: improved Disposition: home with [facility] PT  Diet recommendation: regular, 1 liter fluid restriction/24 hours  History of present illness:  56 year old female admitted for worsened hyponatremia with a sodium of 120, with a slightly increased white blood cell count and recent pneumonia.  Hospital Course:  Jennifer Hurley was admitted and treated for hyponatremia in consultation with nephrology; her course was prolonged by non-compliance with fluid restriction but eventually sodium improved on fluid restriction, Lasix and demeclocycline. Fluid restriction will be important ongoing as outpatient. Discussed with Dr. Caryn Section 10/4, recommends total 7 days demeclocycline and follow-up BMPs twice weekly until sodium stable. See further comments below. Hospitalization was prolonged by recurrent hyponatremia.  Other issues included resolving necrotizing pneumonia (diagnosed on previous admission); will complete oral antibiotics as outpatient (see below).  1. Hyponatremia--improved, continue fluid restriction and demeclocycline total 7 days (discussed with patient). Thought to be secondary to water overload and sodium depletion initially. Improved somewhat with NS and fluid restriction and then recurred. Cortisol and TSH normal. Tolvaptan caused rapid improvement, was stopped by nephrology and sodium dropped. Nephrology recommended fluid restriction, Lasix and demeclocycline. Enforcing compliance with water restriction has been challenging but solute intake has improved. Likely secondary to excessive fluid intake and current pneumonia. 2. Hypokalemia--repleted.  3. Normocytic anemia--stable.  4. CAP/necrotizing pneumonia--Stable. Dr. Gwenlyn Perking discussed with ID--plan total 2 weeks with Bactrim and repeat CT chest in 6 months  5. Fall --multifactorial, related to patient's hyponatremia and recent infection. Patient refuses SNF. HH PT. 6. COPD--stable.  7. History of reported substance abuse  8. Severe malnutrition in context of acute illness  9. Severe centrilobular emphysema--with probable biapical pleural parenchymal scarring. Somewhat more nodular component at the right apex. Consider follow-up with chest CT at 3 - 6 months to confirm resolution of the adenopathy and stability of the right apical Nodularity.  10. Age advanced coronary artery atherosclerosis--by CT imaging. Asymptomatic. Consider assessment of coronary risk factors and consideration of medical therapy as outpatient.  Complains of vaginal discharge--Diflucan, follow-up with PCP next week.  Consultants:  Nephrology   PT--HH PT  Procedures:    Antibiotics:  Bactrim 9/30 >> 10/13.  Discharge Instructions  Discharge Orders    Future Orders Please Complete By Expires   Diet general      Increase activity slowly      Discharge  instructions      Comments:   Be sure to follow-up with new doctor as directed. Finish all antibiotics. Limit fluid consumption to 1 liter per day.  Home Health      Questions: Responses:   To provide the following care/treatments PT   Face-to-face encounter      Comments:   I Editha Bridgeforth certify that this patient is under my care and that I, or a nurse practitioner or physician's assistant working with me, had a face-to-face encounter that meets the physician face-to-face encounter requirements with this patient on 11/13/2011.   Questions: Responses:   The encounter with the patient was in whole, or in part, for the following medical condition, which is the primary reason for home health care hyponatremia, necrotizing pneumonia   I certify that, based on my findings, the following services are medically necessary home health services Physical therapy   My clinical findings support the need for the above services High Risk for rehospitalization   Further, I certify that my clinical findings support that this patient is homebound due to: Unsafe ambulation due to balance issues   To provide the following care/treatments PT       Medication List     As of 11/13/2011 11:14 AM    STOP taking these medications         ibuprofen 200 MG tablet   Commonly known as: ADVIL,MOTRIN      levofloxacin 500 MG tablet   Commonly known as: LEVAQUIN      TAKE these medications         albuterol (5 MG/ML) 0.5% nebulizer solution   Commonly known as: PROVENTIL   Take 0.5 mLs (2.5 mg total) by nebulization every 6 (six) hours as needed for wheezing or shortness of breath.      demeclocycline 150 MG tablet   Commonly known as: DECLOMYCIN   Take 2 tablets (300 mg total) by mouth every 12 (twelve) hours. may cause photosensitivity; discontinue if skin erythema occurs. Use skin protection and avoid prolonged exposure to sunlight; do not use tanning equipment.      feeding supplement Liqd   Take  237 mLs by mouth 2 (two) times daily between meals.      fluconazole 150 MG tablet   Commonly known as: DIFLUCAN   Take 1 tablet (150 mg total) by mouth once.      Fluticasone-Salmeterol 250-50 MCG/DOSE Aepb   Commonly known as: ADVAIR   Inhale 1 puff into the lungs 2 (two) times daily.      furosemide 40 MG tablet   Commonly known as: LASIX   Take 1 tablet (40 mg total) by mouth 2 (two) times daily.      guaiFENesin-dextromethorphan 100-10 MG/5ML syrup   Commonly known as: ROBITUSSIN DM   Take 5 mLs by mouth every 4 (four) hours as needed for cough.      HYDROcodone-acetaminophen 5-325 MG per tablet   Commonly known as: NORCO/VICODIN   Take 1-2 tablets by mouth every 4 (four) hours as needed for pain.      nicotine 14 mg/24hr patch   Commonly known as: NICODERM CQ - dosed in mg/24 hours   Place 1 patch onto the skin daily. If not smoking.      potassium chloride 10 MEQ tablet   Commonly known as: K-DUR   Take 2 tablets (20 mEq total) by mouth daily.      sulfamethoxazole-trimethoprim 800-160 MG per tablet   Commonly known as: BACTRIM DS   Take 1 tablet by mouth every 12 (twelve) hours. Last dose 10/13      tiotropium 18 MCG inhalation capsule   Commonly known as:  SPIRIVA   Place 1 capsule (18 mcg total) into inhaler and inhale daily.       The results of significant diagnostics from this hospitalization (including imaging, microbiology, ancillary and laboratory) are listed below for reference.    Significant Diagnostic Studies: Dg Chest 2 View  11/06/2011  *RADIOLOGY REPORT*  Clinical Data: Follow up cavitary right upper lobe pneumonia.  CHEST - 2 VIEW  Comparison: Two-view chest x-ray 11/03/2011.  Portable chest x-rays dating back to 10/28/2011.  Findings: Further aeration of the cavity within the area of necrotic pneumonia in the right upper lobe since the examination 3 days ago.  No change in the airspace consolidation.  No new pulmonary parenchymal abnormalities  elsewhere.  Stable severe COPD/emphysema and left upper lobe scarring.  Cardiomediastinal silhouette unremarkable, unchanged.  Generalized osteopenia again noted.  IMPRESSION: Further aeration of the cavity within the necrotic right upper lobe pneumonia, though the airspace consolidation is unchanged since the examination 3 days ago.  This is superimposed upon severe COPD/emphysema.  No new abnormalities.   Original Report Authenticated By: Arnell Sieving, M.D.    Ct Chest W Contrast  11/08/2011  *RADIOLOGY REPORT*  Clinical Data: Pneumonia.  Evaluate cavitary process.  CT CHEST WITH CONTRAST  Technique:  Multidetector CT imaging of the chest was performed following the standard protocol during bolus administration of intravenous contrast.  Contrast: 80mL OMNIPAQUE IOHEXOL 300 MG/ML  SOLN  Comparison: Plain films of 11/06/2011 and 11/03/2011.  CT of 10/28/2011.  Findings: Lung windows demonstrate severe underlying centrilobular emphysema.  Secretions along the left side trachea.  Mild nonspecific bronchial wall thickening to the right lower lobe.  Biapical pleural parenchymal scarring.  The right-sided presumed scarring has a somewhat nodular appearance on image 9.  A left lower lobe 4 mm nodule on image 34 was present in 2010, suggesting benignity. Dependent scar or atelectasis in the superior segment left lower lobe.  The extent of pneumonia within the posterior right upper lobe is decreased.  There are patchy areas of right middle lobe airspace disease as well.  The right middle lobe areas are primarily similar with slight increase in lateral right middle lobe opacity on image 44.  Somewhat more confluent areas of cavitation within the posterior right upper lobe.  For example, well circumscribed gas component measures 2.9 x 2.9 cm on image 23.  No well-defined fluid collection identified.  Soft tissue windows demonstrate no supraclavicular adenopathy. Advanced aortic atherosclerosis.  Normal heart size  with trace pericardial fluid. Multivessel coronary artery atherosclerosis. Calcified plaque is seen distal left main versus proximal LAD. Resolved right-sided pleural fluid.  No left effusion.  Small prevascular nodes are unchanged.  There is right hilar adenopathy at 1.2 cm on image 26 which is unchanged.  The esophagus is mildly dilated with a fluid level within.  Limited abdominal imaging demonstrates no significant findings.  No acute osseous abnormality.  IMPRESSION:  1.  Evolution of necrotizing pneumonia.  Overall improved right upper and less so right middle lobe airspace disease.  Increase in extra alveolar air, consistent with necrosis.  No well-defined abscess. 2.  Similar mild right hilar adenopathy.  Favored to be reactive. 3.  Severe centrilobular emphysema with probable biapical pleural parenchymal scarring.  Somewhat more nodular component at the right apex.  Consider follow-up with chest CT at 3 - 6 months to confirm resolution of the adenopathy and stability of the right apical nodularity. 4. Age advanced coronary artery atherosclerosis.  Recommend assessment of coronary risk factors  and consideration of medical therapy. 5.  Resolution of small right pleural effusion.   Original Report Authenticated By: Consuello Bossier, M.D.    Labs: Basic Metabolic Panel:  Lab 11/13/11 1610 11/12/11 0358 11/11/11 0520 11/10/11 0435 11/09/11 0453 11/08/11 0523 11/07/11 0443  NA 127* 125* 126* 123* 127* -- --  K 3.7 3.7 3.2* 3.8 4.4 -- --  CL 92* 90* 89* 91* 95* -- --  CO2 21 20 22  17* 22 -- --  GLUCOSE 84 88 95 102* 76 -- --  BUN 7 9 6  4* 3* -- --  CREATININE 0.67 0.64 0.54 0.47* 0.41* -- --  CALCIUM 9.0 8.8 8.9 8.8 8.8 -- --  MG -- -- -- -- -- 1.6 --  PHOS -- -- -- -- -- 4.4 4.0   Liver Function Tests:  Lab 11/08/11 0523 11/07/11 0443  AST -- --  ALT -- --  ALKPHOS -- --  BILITOT -- --  PROT -- --  ALBUMIN 2.2* 2.1*   CBC:  Lab 11/08/11 0523  WBC 7.2  NEUTROABS --  HGB 9.7*  HCT  27.2*  MCV 76.6*  PLT 394    Active Problems:  COPD  Leukocytosis  Hyponatremia  Hypokalemia  CAP (community acquired pneumonia)   Time coordinating discharge: 35 minutes  Signed:  [name], MD [facility] 11/13/2011, 11:14 AM

## 2011-11-13 NOTE — Progress Notes (Signed)
Patient to be discharged home today. [facility] called for resumption of care. Abelino Derrick RN,BSN,MHA

## 2011-11-13 NOTE — Progress Notes (Signed)
Patient discharge to home, alert and oriented, no complaints of any pain or discomfort, daughter at bedside. D/C instructions and follow up appointment done and given to patient, verbalized understanding.

## 2012-02-12 ENCOUNTER — Inpatient Hospital Stay (HOSPITAL_COMMUNITY)
Admission: EM | Admit: 2012-02-12 | Discharge: 2012-02-17 | DRG: 640 | Disposition: A | Discharge status: Home Health | Payer: Medicare Other | Attending: Internal Medicine | Admitting: Internal Medicine

## 2012-02-12 ENCOUNTER — Encounter (HOSPITAL_COMMUNITY): Payer: Self-pay | Admitting: Emergency Medicine

## 2012-02-12 DIAGNOSIS — E43 Unspecified severe protein-calorie malnutrition: Secondary | ICD-10-CM | POA: Diagnosis present

## 2012-02-12 DIAGNOSIS — J439 Emphysema, unspecified: Secondary | ICD-10-CM | POA: Diagnosis present

## 2012-02-12 DIAGNOSIS — Z79899 Other long term (current) drug therapy: Secondary | ICD-10-CM

## 2012-02-12 DIAGNOSIS — J449 Chronic obstructive pulmonary disease, unspecified: Secondary | ICD-10-CM

## 2012-02-12 DIAGNOSIS — J9610 Chronic respiratory failure, unspecified whether with hypoxia or hypercapnia: Secondary | ICD-10-CM | POA: Diagnosis present

## 2012-02-12 DIAGNOSIS — E871 Hypo-osmolality and hyponatremia: Secondary | ICD-10-CM

## 2012-02-12 DIAGNOSIS — I509 Heart failure, unspecified: Secondary | ICD-10-CM | POA: Diagnosis present

## 2012-02-12 DIAGNOSIS — J9601 Acute respiratory failure with hypoxia: Secondary | ICD-10-CM

## 2012-02-12 DIAGNOSIS — E878 Other disorders of electrolyte and fluid balance, not elsewhere classified: Secondary | ICD-10-CM

## 2012-02-12 DIAGNOSIS — E861 Hypovolemia: Secondary | ICD-10-CM | POA: Diagnosis present

## 2012-02-12 DIAGNOSIS — F1910 Other psychoactive substance abuse, uncomplicated: Secondary | ICD-10-CM

## 2012-02-12 DIAGNOSIS — E876 Hypokalemia: Secondary | ICD-10-CM

## 2012-02-12 DIAGNOSIS — J189 Pneumonia, unspecified organism: Secondary | ICD-10-CM

## 2012-02-12 DIAGNOSIS — J479 Bronchiectasis, uncomplicated: Secondary | ICD-10-CM | POA: Diagnosis present

## 2012-02-12 DIAGNOSIS — I5022 Chronic systolic (congestive) heart failure: Secondary | ICD-10-CM

## 2012-02-12 DIAGNOSIS — J984 Other disorders of lung: Secondary | ICD-10-CM | POA: Diagnosis present

## 2012-02-12 DIAGNOSIS — E46 Unspecified protein-calorie malnutrition: Secondary | ICD-10-CM

## 2012-02-12 DIAGNOSIS — I252 Old myocardial infarction: Secondary | ICD-10-CM

## 2012-02-12 DIAGNOSIS — M069 Rheumatoid arthritis, unspecified: Secondary | ICD-10-CM | POA: Diagnosis present

## 2012-02-12 DIAGNOSIS — J9611 Chronic respiratory failure with hypoxia: Secondary | ICD-10-CM | POA: Diagnosis present

## 2012-02-12 DIAGNOSIS — J4489 Other specified chronic obstructive pulmonary disease: Secondary | ICD-10-CM

## 2012-02-12 DIAGNOSIS — D509 Iron deficiency anemia, unspecified: Secondary | ICD-10-CM | POA: Diagnosis present

## 2012-02-12 DIAGNOSIS — R627 Adult failure to thrive: Secondary | ICD-10-CM | POA: Diagnosis present

## 2012-02-12 DIAGNOSIS — Z681 Body mass index (BMI) 19 or less, adult: Secondary | ICD-10-CM

## 2012-02-12 DIAGNOSIS — B370 Candidal stomatitis: Secondary | ICD-10-CM

## 2012-02-12 DIAGNOSIS — D72829 Elevated white blood cell count, unspecified: Secondary | ICD-10-CM

## 2012-02-12 DIAGNOSIS — I10 Essential (primary) hypertension: Secondary | ICD-10-CM

## 2012-02-12 DIAGNOSIS — F17200 Nicotine dependence, unspecified, uncomplicated: Secondary | ICD-10-CM | POA: Diagnosis present

## 2012-02-12 DIAGNOSIS — A599 Trichomoniasis, unspecified: Secondary | ICD-10-CM

## 2012-02-12 LAB — CBC WITH DIFFERENTIAL/PLATELET
Basophils Relative: 0 % (ref 0–1)
Eosinophils Relative: 0 % (ref 0–5)
HCT: 39.2 % (ref 36.0–46.0)
Hemoglobin: 14.5 g/dL (ref 12.0–15.0)
Lymphs Abs: 1.6 10*3/uL (ref 0.7–4.0)
MCV: 72.9 fL — ABNORMAL LOW (ref 78.0–100.0)
Monocytes Relative: 5 % (ref 3–12)
Neutro Abs: 11.4 10*3/uL — ABNORMAL HIGH (ref 1.7–7.7)
RBC: 5.38 MIL/uL — ABNORMAL HIGH (ref 3.87–5.11)
RDW: 12.5 % (ref 11.5–15.5)
WBC: 13.7 10*3/uL — ABNORMAL HIGH (ref 4.0–10.5)

## 2012-02-12 LAB — COMPREHENSIVE METABOLIC PANEL
Alkaline Phosphatase: 121 U/L — ABNORMAL HIGH (ref 39–117)
BUN: 5 mg/dL — ABNORMAL LOW (ref 6–23)
CO2: 21 mEq/L (ref 19–32)
Chloride: 75 mEq/L — ABNORMAL LOW (ref 96–112)
Creatinine, Ser: 0.37 mg/dL — ABNORMAL LOW (ref 0.50–1.10)
GFR calc non Af Amer: >90 mL/min (ref >90)
Glucose, Bld: 73 mg/dL (ref 70–99)
Potassium: 3.3 mEq/L — ABNORMAL LOW (ref 3.5–5.1)
Total Bilirubin: 0.8 mg/dL (ref 0.3–1.2)

## 2012-02-12 LAB — URINE MICROSCOPIC-ADD ON

## 2012-02-12 LAB — URINALYSIS, ROUTINE W REFLEX MICROSCOPIC: Nitrite: NEGATIVE

## 2012-02-12 MED ORDER — ENOXAPARIN SODIUM 40 MG/0.4ML ~~LOC~~ SOLN: 40.0000 mg | SUBCUTANEOUS | Status: DC

## 2012-02-12 MED ORDER — ALBUTEROL SULFATE (5 MG/ML) 0.5% IN NEBU: 2.5000 mg | INHALATION_SOLUTION | Freq: Four times a day (QID) | RESPIRATORY_TRACT | Status: DC | PRN

## 2012-02-12 MED ORDER — ENOXAPARIN SODIUM 30 MG/0.3ML ~~LOC~~ SOLN
30.0000 mg | SUBCUTANEOUS | Status: DC
  Administered 2012-02-12 – 2012-02-16 (×3): 30 mg via SUBCUTANEOUS
  Filled 2012-02-12 (×6): qty 0.3

## 2012-02-12 MED ORDER — ONDANSETRON HCL 4 MG/2ML IJ SOLN: 4.0000 mg | Freq: Four times a day (QID) | INTRAMUSCULAR | Status: DC | PRN

## 2012-02-12 MED ORDER — SODIUM CHLORIDE 0.9 % IV SOLN
INTRAVENOUS | Status: DC
  Administered 2012-02-12 – 2012-02-16 (×7): via INTRAVENOUS

## 2012-02-12 MED ORDER — TIOTROPIUM BROMIDE MONOHYDRATE 18 MCG IN CAPS
18.0000 ug | ORAL_CAPSULE | Freq: Every day | RESPIRATORY_TRACT | Status: DC
  Administered 2012-02-13 – 2012-02-17 (×5): 18 ug via RESPIRATORY_TRACT
  Filled 2012-02-12 (×2): qty 5

## 2012-02-12 MED ORDER — DEMECLOCYCLINE HCL 150 MG PO TABS
300.0000 mg | ORAL_TABLET | Freq: Two times a day (BID) | ORAL | Status: DC
  Administered 2012-02-12 – 2012-02-17 (×10): 300 mg via ORAL
  Filled 2012-02-12 (×12): qty 2

## 2012-02-12 MED ORDER — ACETAMINOPHEN 325 MG PO TABS
650.0000 mg | ORAL_TABLET | Freq: Four times a day (QID) | ORAL | Status: DC | PRN
  Administered 2012-02-14: 650 mg via ORAL
  Filled 2012-02-12 (×2): qty 2

## 2012-02-12 MED ORDER — ONDANSETRON HCL 4 MG PO TABS: 4.0000 mg | ORAL_TABLET | Freq: Four times a day (QID) | ORAL | Status: DC | PRN

## 2012-02-12 MED ORDER — SODIUM CHLORIDE 0.9 % IJ SOLN
3.0000 mL | Freq: Two times a day (BID) | INTRAMUSCULAR | Status: DC
  Administered 2012-02-13 – 2012-02-16 (×2): 3 mL via INTRAVENOUS

## 2012-02-12 MED ORDER — ENSURE COMPLETE PO LIQD
237.0000 mL | Freq: Two times a day (BID) | ORAL | Status: DC
  Administered 2012-02-13 – 2012-02-16 (×7): 237 mL via ORAL

## 2012-02-12 MED ORDER — MOMETASONE FURO-FORMOTEROL FUM 100-5 MCG/ACT IN AERO
2.0000 | INHALATION_SPRAY | Freq: Two times a day (BID) | RESPIRATORY_TRACT | Status: DC
  Administered 2012-02-13 – 2012-02-17 (×10): 2 via RESPIRATORY_TRACT
  Filled 2012-02-12: qty 8.8

## 2012-02-12 NOTE — ED Notes (Addendum)
Per EMS: Pt called for [facility], however O2 sat was 96%, lungs CTA. Pt is orthopneic. Pt was treated for pneumonia several months ago.  Pt reports being tired, lethargic, and weak since yesterday. Pt was incontinent upon EMS arrival. Pt also reports dark colored urine. Pt has history of asthma. Pt denies chest pain, diaphoresis. Pt A/O x4 and at baseline per family, unremarkable neuro check.

## 2012-02-12 NOTE — ED Provider Notes (Signed)
History     CSN: [id number]  Arrival date & time 02/12/12  1528   First MD Initiated Contact with Patient 02/12/12 1535      Chief Complaint  Patient presents with  . Dehydration  . Weakness    (Consider location/radiation/quality/duration/timing/severity/associated sxs/prior treatment) Patient is a 57 y.o. female presenting with weakness. The history is provided by the patient.  Weakness  Additional symptoms include weakness.   patient here complaining of 2 days of diffuse whole-body weakness. Similar symptoms associated with hyponatremia for which she was admitted for3 months ago. She admits to drinking 6 bottles of water a day. No fever or chills. No vomiting but does have some loose stools. Denies any severe headache, neck pain, chest pain, abdominal pain. No urinary symptoms. Weakness is persistent and nothing makes it better or worse  Past Medical History  Diagnosis Date  . Rheumatoid arthritis   . Bronchitis   . Myocardial infarction   . Pneumonia     Past Surgical History  Procedure Date  . Tonsillectomy   . Tubal ligation     Family History  Problem Relation Age of Onset  . Coronary artery disease    . Diabetes type II      History  Substance Use Topics  . Smoking status: Current Every Day Smoker -- 1.0 packs/day for 39 years  . Smokeless tobacco: Never Used  . Alcohol Use: Yes     Comment: quart of beer per day    OB History    Grav Para Term Preterm Abortions TAB SAB Ect Mult Living                  Review of Systems  Neurological: Positive for weakness.  All other systems reviewed and are negative.    Allergies  Review of patient's allergies indicates no known allergies.  Home Medications   Current Outpatient Rx  Name  Route  Sig  Dispense  Refill  . ALBUTEROL SULFATE (5 MG/ML) 0.5% IN NEBU   Nebulization   Take 0.5 mLs (2.5 mg total) by nebulization every 6 (six) hours as needed for wheezing or shortness of breath.   20 mL   0   .  DEMECLOCYCLINE HCL 150 MG PO TABS   Oral   Take 2 tablets (300 mg total) by mouth every 12 (twelve) hours. may cause photosensitivity; discontinue if skin erythema occurs. Use skin protection and avoid prolonged exposure to sunlight; do not use tanning equipment.   16 tablet   0   . ENSURE COMPLETE PO LIQD   Oral   Take 237 mLs by mouth 2 (two) times daily between meals.         Marland Kitchen FLUTICASONE-SALMETEROL 250-50 MCG/DOSE IN AEPB   Inhalation   Inhale 1 puff into the lungs 2 (two) times daily.   60 each   0   . TIOTROPIUM BROMIDE MONOHYDRATE 18 MCG IN CAPS   Inhalation   Place 1 capsule (18 mcg total) into inhaler and inhale daily.   30 capsule   0     SpO2 96%  Physical Exam  Nursing note and vitals reviewed. Constitutional: She is oriented to person, place, and time. She appears well-developed and well-nourished.  Non-toxic appearance. No distress.  HENT:  Head: Normocephalic and atraumatic.  Eyes: Conjunctivae normal, EOM and lids are normal. Pupils are equal, round, and reactive to light.  Neck: Normal range of motion. Neck supple. No tracheal deviation present. No mass present.  Cardiovascular: Normal  rate, regular rhythm and normal heart sounds.  Exam reveals no gallop.   No murmur heard. Pulmonary/Chest: Effort normal and breath sounds normal. No stridor. No respiratory distress. She has no decreased breath sounds. She has no wheezes. She has no rhonchi. She has no rales.  Abdominal: Soft. Normal appearance and bowel sounds are normal. She exhibits no distension. There is no tenderness. There is no rebound and no CVA tenderness.  Musculoskeletal: Normal range of motion. She exhibits no edema and no tenderness.  Neurological: She is alert and oriented to person, place, and time. She has normal strength. No cranial nerve deficit or sensory deficit. GCS eye subscore is 4. GCS verbal subscore is 5. GCS motor subscore is 6.  Skin: Skin is warm and dry. No abrasion and no rash  noted.  Psychiatric: Her speech is normal and behavior is normal. Her affect is blunt.    ED Course  Procedures (including critical care time)  Labs Reviewed - No data to display No results found.   No diagnosis found.    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

## 2012-02-12 NOTE — ED Notes (Signed)
Pt unable to void 

## 2012-02-12 NOTE — H&P (Signed)
History and Physical  TANYLA STEGE ZOX:[id number] DOB: 1955/11/04 DOA: 02/12/2012  Referring physician: Dr Freida Busman PCP: Lonia Blood, MD   Chief Complaint: Weakness  HPI:  Patient is a 57 year old female with past medical history most significant for rheumatoid arthritis and previous admission for pneumonia and hyponatremia comes in today with chief complaint of weakness. Weakness started 2 days ago. Patient complains that she has also been having some diarrhea since last 1 week. Patient had about 2 episodes of diarrhea every day. Bowel movements are described as watery but no blood noted. Patient also complains of decreased appetite since last one week. Patient denies any chest pain, shortness of breath, headaches, ear pain, abdominal pain, change in urinary habits, swelling in the body, weakness in any one particular limb, slurring of speech, seizures, or palpitations.  Patient drinks about 6 bottles of 20 oz water bottle every day.  Review of Systems:  As per history of present illness  Past Medical History  Diagnosis Date  . Rheumatoid arthritis   . Bronchitis   . Myocardial infarction   . Pneumonia     Past Surgical History  Procedure Date  . Tonsillectomy   . Tubal ligation     Social History:  reports that she has been smoking.  She has never used smokeless tobacco. She reports that she drinks alcohol. She reports that she uses illicit drugs (Cocaine and Marijuana).  No Known Allergies  Family History  Problem Relation Age of Onset  . Coronary artery disease    . Diabetes type II       Prior to Admission medications   Medication Sig Start Date End Date Taking? Authorizing Provider  albuterol (PROVENTIL) (5 MG/ML) 0.5% nebulizer solution Take 0.5 mLs (2.5 mg total) by nebulization every 6 (six) hours as needed for wheezing or shortness of breath. 11/02/11  Yes Alison Murray, MD  demeclocycline (DECLOMYCIN) 150 MG tablet Take 2 tablets (300 mg total) by mouth every 12  (twelve) hours. may cause photosensitivity; discontinue if skin erythema occurs. Use skin protection and avoid prolonged exposure to sunlight; do not use tanning equipment. 11/13/11  Yes Standley Brooking, MD  feeding supplement (ENSURE COMPLETE) LIQD Take 237 mLs by mouth 2 (two) times daily between meals. 11/13/11  Yes Standley Brooking, MD  Fluticasone-Salmeterol (ADVAIR DISKUS) 250-50 MCG/DOSE AEPB Inhale 1 puff into the lungs 2 (two) times daily. 11/02/11  Yes Alison Murray, MD  tiotropium (SPIRIVA) 18 MCG inhalation capsule Place 1 capsule (18 mcg total) into inhaler and inhale daily. 11/02/11  Yes Alison Murray, MD   Physical Exam: Filed Vitals:   02/12/12 1529 02/12/12 1655 02/12/12 1947  BP:  122/80 113/62  Pulse:   113  Temp:  98.3 F (36.8 C) 98.3 F (36.8 C)  TempSrc:  Oral Oral  Resp:   22  SpO2: 96%  96%    Physical Exam: General:  seems dehydrated with sunken eyes, patient is cachectic looking, Vital signs reviewed and noted. in no acute distress; alert, appropriate and cooperative throughout examination.  Head: Normocephalic, atraumatic.  Eyes: PERRL, EOMI, No signs of anemia or jaundince.  Nose: Mucous membranes dry, not inflammed, nonerythematous.  Throat: Oropharynx nonerythematous, no exudate appreciated.   Neck:  No deformities, masses, or tenderness noted.Supple, No carotid Bruits, no JVD.  Lungs:  decreased breath sounds at bases bilaterally, without crackles or wheezes.  Heart: RRR. S1 and S2 normal without gallop, murmur, or rubs.  Abdomen:  BS normoactive. Soft, Nondistended, non-tender.  No masses or organomegaly.  Extremities: No pretibial edema.  Neurologic: A&O X3, CN II - XII are grossly intact. Motor strength is 5/5 in the all 4 extremities, Sensations intact to light touch, Cerebellar signs negative.  Skin: No visible rashes, scars.     Wt Readings from Last 3 Encounters:  11/03/11 78 lb 4.2 oz (35.5 kg)  11/02/11 78 lb 3.2 oz (35.471 kg)  02/27/08 87  lb (39.463 kg)    Labs on Admission:  Basic Metabolic Panel:  Lab 02/12/12 9811  NA 118*  K 3.3*  CL 75*  CO2 21  GLUCOSE 73  BUN 5*  CREATININE 0.37*  CALCIUM 9.6  MG --  PHOS --    Liver Function Tests:  Lab 02/12/12 1629  AST 20  ALT 7  ALKPHOS 121*  BILITOT 0.8  PROT 8.3  ALBUMIN 3.0*    CBC:  Lab 02/12/12 1629  WBC 13.7*  NEUTROABS 11.4*  HGB 14.5  HCT 39.2  MCV 72.9*  PLT 316     BNP (last 3 results)  Basename 10/28/11 2052  PROBNP 133.2*      Radiological Exams on Admission: No results found.  EKG: Independently reviewed. 126 beats per minute, sinus rhythm, left axis deviation noted, nonspecific T wave changes in precordial lateral leads.   Principal Problem:  *Hyponatremia Active Problems:  COPD  Leukocytosis  Hypokalemia  Chloride, decreased level   Assessment/Plan  Patient is a 57 year old female with past medical history most significant for COPD, hyponatremia, substance abuse in the past, hypertension and chronic systolic congestive heart failure who is currently being admitted with acute weakness and found to have a sodium of 118 on admission with other electrolyte abnormalities as noted above. -Admit to Telemetry as this patient was tachycardic on EKG -Strict water restriction for treatment of psychogenic polydipsia/SIADH induced hyponatremia -Continue demeclocycline which has worked for her in previous admission -Consider renal consult in a.m. if hyponatremia is not resolved -Replete potassium and it will help sodium repletion as well (due to physiological shift of sodium from inside to cells to                                                                                                    extracellular space as per up to date) -Continue home inhalers -Basic metabolic profile in a.m. -Start regular diet with Ensure -Nutrition consult for malnutrition -Patient will need a primary care physician at discharge and will consult  social worker for that.   Code Status: full Family Communication: Discussed in detail with the family and patient Disposition Plan/Anticipated LOS: 1-2 days  Time spent: 70 minutes  Lars Mage, MD  [facility] Team 5  If 7PM-7AM, please contact night-coverage at [email or web address], password [facility] 02/12/2012, 9:24 PM

## 2012-02-12 NOTE — ED Notes (Signed)
Pt.remided for urinalysis, verbalized understanding .

## 2012-02-12 NOTE — ED Notes (Signed)
AVW:[id number]<XB> Expected date:<BR> Expected time:<BR> Means of arrival:Ambulance<BR> Comments:<BR> weakness

## 2012-02-12 NOTE — ED Notes (Signed)
Bed:WA10<BR> Expected date:<BR> Expected time:<BR> Means of arrival:<BR> Comments:<BR> triage

## 2012-02-13 ENCOUNTER — Inpatient Hospital Stay (HOSPITAL_COMMUNITY): Payer: Medicare Other

## 2012-02-13 DIAGNOSIS — E46 Unspecified protein-calorie malnutrition: Secondary | ICD-10-CM

## 2012-02-13 DIAGNOSIS — A599 Trichomoniasis, unspecified: Secondary | ICD-10-CM

## 2012-02-13 DIAGNOSIS — E871 Hypo-osmolality and hyponatremia: Principal | ICD-10-CM

## 2012-02-13 LAB — BASIC METABOLIC PANEL
CO2: 19 mEq/L (ref 19–32)
CO2: 20 mEq/L (ref 19–32)
Calcium: 8.7 mg/dL (ref 8.4–10.5)
Chloride: 89 mEq/L — ABNORMAL LOW (ref 96–112)
Chloride: 91 mEq/L — ABNORMAL LOW (ref 96–112)
Creatinine, Ser: 0.3 mg/dL — ABNORMAL LOW (ref 0.50–1.10)
GFR calc Af Amer: >90 mL/min (ref >90)
GFR calc non Af Amer: >90 mL/min (ref >90)
Glucose, Bld: 147 mg/dL — ABNORMAL HIGH (ref 70–99)
Potassium: 4.2 mEq/L (ref 3.5–5.1)
Potassium: 3 mEq/L — ABNORMAL LOW (ref 3.5–5.1)
Sodium: 119 mEq/L — CL (ref 135–145)
Sodium: 121 mEq/L — ABNORMAL LOW (ref 135–145)
Sodium: 123 mEq/L — ABNORMAL LOW (ref 135–145)

## 2012-02-13 MED ORDER — PNEUMOCOCCAL 13-VAL CONJ VACC IM SUSP: 0.5000 mL | INTRAMUSCULAR | Status: DC

## 2012-02-13 MED ORDER — INFLUENZA VIRUS VACC SPLIT PF IM SUSP
0.5000 mL | INTRAMUSCULAR | Status: AC
  Filled 2012-02-13 (×3): qty 0.5

## 2012-02-13 MED ORDER — POTASSIUM CHLORIDE 10 MEQ/100ML IV SOLN
10.0000 meq | INTRAVENOUS | Status: AC
  Administered 2012-02-13 (×6): 10 meq via INTRAVENOUS
  Filled 2012-02-13 (×6): qty 100

## 2012-02-13 MED ORDER — PNEUMOCOCCAL VAC POLYVALENT 25 MCG/0.5ML IJ INJ
0.5000 mL | INJECTION | INTRAMUSCULAR | Status: AC
Start: 2012-02-14 — End: 2012-02-15
  Filled 2012-02-13 (×2): qty 0.5

## 2012-02-13 MED ORDER — METRONIDAZOLE 500 MG PO TABS
500.0000 mg | ORAL_TABLET | Freq: Two times a day (BID) | ORAL | Status: DC
  Administered 2012-02-13 – 2012-02-17 (×6): 500 mg via ORAL
  Filled 2012-02-13 (×11): qty 1

## 2012-02-13 MED ORDER — POTASSIUM CHLORIDE CRYS ER 20 MEQ PO TBCR
20.0000 meq | EXTENDED_RELEASE_TABLET | ORAL | Status: DC
  Administered 2012-02-13 (×2): 20 meq via ORAL
  Filled 2012-02-13 (×3): qty 1

## 2012-02-13 MED ORDER — GUAIFENESIN-DM 100-10 MG/5ML PO SYRP
5.0000 mL | ORAL_SOLUTION | ORAL | Status: DC | PRN
  Administered 2012-02-13 – 2012-02-14 (×2): 5 mL via ORAL
  Filled 2012-02-13 (×2): qty 10

## 2012-02-13 NOTE — Progress Notes (Signed)
[facility] PROGRESS NOTE  SHYNE RESCH ZOX:[id number] DOB: 05-16-1955 DOA: 02/12/2012 PCP: Lonia Blood, MD  Assessment/Plan: Hyponatremia Patient presents with a recurrent hyponatremia and was admitted for months back with hypovolemic hyponatremia. Patient however has been drinking large-volume water and is placed on fluid restriction. Continue IV normal saline. Monitor serum sodium levels every 6-8 hours. Monitor for any change in mental status.  -Check serum osmolality, urine osmolality and FeNA Check magnesium and replenish potassium. -Check Pseudomonas on the, female and urine nodularity. -Vision also complains off weight loss for past few weeks and is extremely cachectic. We'll get chest x-ray to evaluate for any pneumonia or a lung mass and also check for HIV. -We will continue treatment plan based on serum and urine osmolality available. Renal consult as needed. -Patient has been on demeclocycline on previous discharge which I will continue.  Severe malnutrition with failure to thrive. Will obtain nutrition consult. Continue with nutrition supplements. Patient has been feeling weak for several weeks. We'll obtain PT eval.  COPD  continue O2 via nasal cannula as needed.   patient is an active smoker  Counseled on smoking cessation  Trichomoniasis asymptomatic Will treat with Flagyl 500 mg twice a day for 5 days  Code Status: Full code Family Communication: None at bedside Disposition Plan: Pending PT eval   Consultants:  None  Procedures:  None  Antibiotics:  Metronidazole started on 1/4  HPI/Subjective: Admission H&P reviewed. Patient denies any significant symptoms however informs of almost 7 pound weight loss for past few weeks.  Objective: Filed Vitals:   02/12/12 1655 02/12/12 1947 02/12/12 2115 02/13/12 0600  BP: 122/80 113/62 102/72 104/70  Pulse:  113 111 105  Temp: 98.3 F (36.8 C) 98.3 F (36.8 C) 98.2 F (36.8 C) 98.2 F (36.8 C)    TempSrc: Oral Oral Oral Oral  Resp:  22 20 18   Height:   5\' 1"  (1.549 m)   Weight:   32.432 kg (71 lb 8 oz)   SpO2:  96% 98% 95%    Intake/Output Summary (Last 24 hours) at 02/13/12 1020 Last data filed at 02/12/12 2200  Gross per 24 hour  Intake    240 ml  Output      0 ml  Net    240 ml   Filed Weights   02/12/12 2115  Weight: 32.432 kg (71 lb 8 oz)    Exam:   General:  Middle aged cachectic female in no acute distress, poorly groomed  HEENT: No pallor, dry oral mucosa, no icterus, no cervical or axillary lymphadenopathy.  Cardiovascular: Normal S1 and S2, no murmurs rub or gallop  Respiratory: To auscultation bilaterally no added sounds  Abdomen: Soft, nontender, nondistended, bowel sounds present  Extremities: Thin extremities, no edema  CNS: AAO x3, appears fatigued.    Data Reviewed: Basic Metabolic Panel:  Lab 02/13/12 8119 02/12/12 1629  NA 119* 118*  K 4.2 3.3*  CL 85* 75*  CO2 21 21  GLUCOSE 90 73  BUN 3* 5*  CREATININE 0.30* 0.37*  CALCIUM 8.7 9.6  MG -- --  PHOS -- --   Liver Function Tests:  Lab 02/12/12 1629  AST 20  ALT 7  ALKPHOS 121*  BILITOT 0.8  PROT 8.3  ALBUMIN 3.0*   No results found for this basename: LIPASE:5,AMYLASE:5 in the last 168 hours No results found for this basename: AMMONIA:5 in the last 168 hours CBC:  Lab 02/12/12 1629  WBC 13.7*  NEUTROABS 11.4*  HGB 14.5  HCT 39.2  MCV 72.9*  PLT 316   Cardiac Enzymes: No results found for this basename: CKTOTAL:5,CKMB:5,CKMBINDEX:5,TROPONINI:5 in the last 168 hours BNP (last 3 results)  Basename 10/28/11 2052  PROBNP 133.2*   CBG: No results found for this basename: GLUCAP:5 in the last 168 hours  No results found for this or any previous visit (from the past 240 hour(s)).   Studies: No results found.  Scheduled Meds:   . demeclocycline  300 mg Oral Q12H  . enoxaparin (LOVENOX) injection  30 mg Subcutaneous Q24H  . feeding supplement  237 mL Oral BID  BM  . influenza  inactive virus vaccine  0.5 mL Intramuscular Tomorrow-1000  . mometasone-formoterol  2 puff Inhalation BID  . pneumococcal 23 valent vaccine  0.5 mL Intramuscular Tomorrow-1000  . sodium chloride  3 mL Intravenous Q12H  . tiotropium  18 mcg Inhalation Daily   Continuous Infusions:   . sodium chloride 125 mL/hr at 02/13/12 0215      Time spent: 25 minutes    Bodee Lafoe  [facility] Pager [phone] If 8PM-8AM, please contact night-coverage at [email or web address], password [facility] 02/13/2012, 10:20 AM  LOS: 1 day

## 2012-02-14 ENCOUNTER — Inpatient Hospital Stay (HOSPITAL_COMMUNITY): Payer: Medicare Other

## 2012-02-14 DIAGNOSIS — J189 Pneumonia, unspecified organism: Secondary | ICD-10-CM

## 2012-02-14 DIAGNOSIS — E876 Hypokalemia: Secondary | ICD-10-CM

## 2012-02-14 LAB — BASIC METABOLIC PANEL
BUN: <3 mg/dL — ABNORMAL LOW (ref 6–23)
BUN: <3 mg/dL — ABNORMAL LOW (ref 6–23)
BUN: <3 mg/dL — ABNORMAL LOW (ref 6–23)
CO2: 20 mEq/L (ref 19–32)
CO2: 21 mEq/L (ref 19–32)
Chloride: 92 mEq/L — ABNORMAL LOW (ref 96–112)
Chloride: 90 mEq/L — ABNORMAL LOW (ref 96–112)
Chloride: 91 mEq/L — ABNORMAL LOW (ref 96–112)
Creatinine, Ser: 0.24 mg/dL — ABNORMAL LOW (ref 0.50–1.10)
GFR calc Af Amer: >90 mL/min (ref >90)
GFR calc non Af Amer: >90 mL/min (ref >90)
Glucose, Bld: 99 mg/dL (ref 70–99)
Glucose, Bld: 120 mg/dL — ABNORMAL HIGH (ref 70–99)
Potassium: 2.7 mEq/L — CL (ref 3.5–5.1)
Potassium: 3.5 mEq/L (ref 3.5–5.1)

## 2012-02-14 LAB — HIV ANTIBODY (ROUTINE TESTING W REFLEX): HIV: NONREACTIVE

## 2012-02-14 LAB — URINE CULTURE

## 2012-02-14 LAB — CBC
HCT: 26.6 % — ABNORMAL LOW (ref 36.0–46.0)
Hemoglobin: 9.5 g/dL — ABNORMAL LOW (ref 12.0–15.0)
MCHC: 36.8 g/dL — ABNORMAL HIGH (ref 30.0–36.0)
MCV: 73.3 fL — ABNORMAL LOW (ref 78.0–100.0)

## 2012-02-14 LAB — SODIUM, URINE, RANDOM: Sodium, Ur: 105 mEq/L

## 2012-02-14 LAB — PHOSPHORUS: Phosphorus: 2.3 mg/dL (ref 2.3–4.6)

## 2012-02-14 LAB — MAGNESIUM: Magnesium: 1.2 mg/dL — ABNORMAL LOW (ref 1.5–2.5)

## 2012-02-14 MED ORDER — VITAMINS A & D EX OINT
TOPICAL_OINTMENT | CUTANEOUS | Status: AC
  Administered 2012-02-14: 09:00:00
  Filled 2012-02-14: qty 5

## 2012-02-14 MED ORDER — PNEUMOCOCCAL VAC POLYVALENT 25 MCG/0.5ML IJ INJ
0.5000 mL | INJECTION | INTRAMUSCULAR | Status: AC
  Administered 2012-02-15: 0.5 mL via INTRAMUSCULAR
  Filled 2012-02-14 (×2): qty 0.5

## 2012-02-14 MED ORDER — VITAMINS A & D EX OINT
TOPICAL_OINTMENT | CUTANEOUS | Status: AC
  Administered 2012-02-14: 5
  Filled 2012-02-14: qty 5

## 2012-02-14 MED ORDER — POTASSIUM CHLORIDE CRYS ER 20 MEQ PO TBCR
40.0000 meq | EXTENDED_RELEASE_TABLET | Freq: Once | ORAL | Status: AC
  Administered 2012-02-14: 40 meq via ORAL
  Filled 2012-02-14: qty 2

## 2012-02-14 MED ORDER — LEVOFLOXACIN 750 MG PO TABS
750.0000 mg | ORAL_TABLET | Freq: Every day | ORAL | Status: DC
  Filled 2012-02-14: qty 1

## 2012-02-14 MED ORDER — IOHEXOL 300 MG/ML  SOLN
80.0000 mL | Freq: Once | INTRAMUSCULAR | Status: AC | PRN
  Administered 2012-02-14: 80 mL via INTRAVENOUS

## 2012-02-14 MED ORDER — MAGNESIUM SULFATE 40 MG/ML IJ SOLN
2.0000 g | Freq: Once | INTRAMUSCULAR | Status: AC
  Administered 2012-02-14: 2 g via INTRAVENOUS
  Filled 2012-02-14: qty 50

## 2012-02-14 MED ORDER — TUBERCULIN PPD 5 UNIT/0.1ML ID SOLN
5.0000 [IU] | Freq: Once | INTRADERMAL | Status: AC
  Administered 2012-02-15: 5 [IU] via INTRADERMAL
  Filled 2012-02-14: qty 0.1

## 2012-02-14 MED ORDER — TRAMADOL HCL 50 MG PO TABS
50.0000 mg | ORAL_TABLET | Freq: Once | ORAL | Status: AC
  Administered 2012-02-14: 50 mg via ORAL
  Filled 2012-02-14: qty 1

## 2012-02-14 MED ORDER — POTASSIUM CHLORIDE 10 MEQ/100ML IV SOLN
10.0000 meq | INTRAVENOUS | Status: AC
  Administered 2012-02-14 (×4): 10 meq via INTRAVENOUS
  Filled 2012-02-14 (×4): qty 100

## 2012-02-14 MED ORDER — PHENOL 1.4 % MT LIQD
1.0000 | OROMUCOSAL | Status: DC | PRN
  Administered 2012-02-14: 1 via OROMUCOSAL
  Filled 2012-02-14: qty 177

## 2012-02-14 MED ORDER — INFLUENZA VIRUS VACC SPLIT PF IM SUSP
0.5000 mL | INTRAMUSCULAR | Status: AC
  Administered 2012-02-15: 0.5 mL via INTRAMUSCULAR
  Filled 2012-02-14: qty 0.5

## 2012-02-14 NOTE — Progress Notes (Signed)
PT Cancellation Note  Patient Details Name: Jennifer Hurley MRN: [id number] DOB: Apr 21, 1955   Cancelled Treatment:    Reason Eval/Treat Not Completed: Other (comment) (Pt refused. "No! I'm not getting out of this bed!") Pt stated she walked earlier today in the hall with nursing. Will follow.    [name] 02/14/2012, 2:18 PM [phone]

## 2012-02-14 NOTE — Progress Notes (Signed)
Patient yelling profanity regarding use of bed alarm.  Attempted to calm patient who states she is ready to go home and that she is going to leave.  Talked to CN who paged Md.  Patient denies wanting to go home with physician and has agreed to stay under the care of an different nurse.

## 2012-02-14 NOTE — Progress Notes (Signed)
INITIAL NUTRITION ASSESSMENT  DOCUMENTATION CODES Per approved criteria  -Severe malnutrition in the context of chronic illness -Underweight   INTERVENTION: Ensure Complete bid Provide patient preferences Encourage po Patient is at risk for refeeding syndrome.  Monitor phos, magnesium and potassium.  NUTRITION DIAGNOSIS: Increased nutrient needs related to weight loss as evidenced by underweight.   Goal: Intake of >75% meals and supplements  Monitor:  Intake, labs, weight trend  Reason for Assessment: Consult/nutrition risk  57 y.o. female  Admitting Dx: Hyponatremia, malnutrition, weakness and FTT  ASSESSMENT: Patient with severe malnutrition related to chronic illness AEB severe depletion of body fat and muscle mass with BMI of 13 and inadequate oral intake.  Pt well known to me from past admits.  Hx of hyponatremia and not compliant with fluid restriction in the hospital or home.  Does not like Ensure pudding but will drink Ensure Complete.  Hx of cocaine abuse.  Patient denies recent drug use and states that she has been eating 3 meals plus snacks at home and Ensure when she has it.  Question accuracy of patient history with 12# weight loss in the past 3 months.  Height: Ht Readings from Last 1 Encounters:  02/12/12 5\' 1"  (1.549 m)    Weight: Wt Readings from Last 1 Encounters:  02/12/12 71 lb 8 oz (32.432 kg)    Ideal Body Weight: 105 lbs  % Ideal Body Weight: 68  Wt Readings from Last 10 Encounters:  02/12/12 71 lb 8 oz (32.432 kg)  11/03/11 78 lb 4.2 oz (35.5 kg)  11/02/11 78 lb 3.2 oz (35.471 kg)  02/27/08 87 lb (39.463 kg)  01/19/08 90 lb 6.1 oz (40.997 kg)  11/03/07 88 lb (39.917 kg)    Usual Body Weight: 83 lb 10/29/11  % Usual Body Weight: 86  BMI:  Body mass index is 13.51 kg/(m^2).  Estimated Nutritional Needs: Kcal: 1350-1450 Protein: 55-65 gm Fluid: 1L FR  Skin: wnl  Diet Order: General with 1 liter fluid restriction  EDUCATION  NEEDS: -Education needs addressed   Intake/Output Summary (Last 24 hours) at 02/14/12 1538 Last data filed at 02/14/12 1000  Gross per 24 hour  Intake   1680 ml  Output    300 ml  Net   1380 ml     Labs:   Lab 02/14/12 1049 02/14/12 0526 02/13/12 1845  NA 123* 124* 123*  K 3.5 2.7* 3.0*  CL 90* 92* 91*  CO2 22 20 20   BUN <3* <3* <3*  CREATININE 0.25* 0.24* 0.29*  CALCIUM 8.1* 8.0* 8.2*  MG -- 1.2* --  PHOS -- 2.3 --  GLUCOSE 120* 99 147*    CBG (last 3)  No results found for this basename: GLUCAP:3 in the last 72 hours  Scheduled Meds:   . demeclocycline  300 mg Oral Q12H  . enoxaparin (LOVENOX) injection  30 mg Subcutaneous Q24H  . feeding supplement  237 mL Oral BID BM  . influenza  inactive virus vaccine  0.5 mL Intramuscular Tomorrow-1000  . influenza  inactive virus vaccine  0.5 mL Intramuscular Tomorrow-1000  . metroNIDAZOLE  500 mg Oral Q12H  . mometasone-formoterol  2 puff Inhalation BID  . pneumococcal 23 valent vaccine  0.5 mL Intramuscular Tomorrow-1000  . pneumococcal 23 valent vaccine  0.5 mL Intramuscular Tomorrow-1000  . sodium chloride  3 mL Intravenous Q12H  . tiotropium  18 mcg Inhalation Daily  . tuberculin  5 Units Intradermal Once    Continuous Infusions:   .  sodium chloride 125 mL/hr at 02/14/12 1115    Past Medical History  Diagnosis Date  . Rheumatoid arthritis   . Bronchitis   . Myocardial infarction   . Pneumonia     Past Surgical History  Procedure Date  . Tonsillectomy   . Tubal ligation     Oran Rein, RD, LDN Clinical Inpatient Dietitian Pager:  [phone] Weekend and after hours pager:  [phone]  '

## 2012-02-14 NOTE — Progress Notes (Addendum)
[facility] PROGRESS NOTE  Jennifer Hurley GNF:[id number] DOB: Jan 01, 1956 DOA: 02/12/2012 PCP: Lonia Blood, MD  Brief narrative: 57 year old malnourished female with history of COPD admitted for weakness and failure to thrive with malnutrition and hyponatremia.  Assessment/Plan:  Hyponatremia  Patient presents with a recurrent hyponatremia and was admitted few months back with hypovolemic hyponatremia. Patient however has been drinking large-volume water and is placed on fluid restriction. Continue IV normal saline. Monitor serum sodium levels every 6-8 hours to see for improvement or overcorrection. . slowly improving and is 124 today. Monitor for any change in mental status.  -low serum osmolality noted, pending urine osmolality and FeNA  -low mg and k being replenished  -Patient has been on demeclocycline on previous discharge which is continued.   Severe malnutrition with failure to thrive.  - nutrition consult. Continue with nutrition supplements.  Patient has been feeling weak for several weeks.  PT eval.    Weight loss  -patient informs losing 8-10 lbs over past few months  CXR obtained with concern for smoking and active cough with hyponatremia shows persistent RUL pneumonia with infected bullae. A CT chest done today to exclude malignancy shows rt UL dense consolidation compatible with PNA with element of cystic bronchiectasis vs cavitation. -discussed with PCCM, the new CT findings does suggest cavitary lesion which needs to be ruled out for TB. Will place her on respiratory isolation, obtain sputum for AFB x 3, place a PPD.  Hold off any antibiotics.  -nebs prn. mucolytic. -HIV ab ordered.   Hypokalemia/ hypomagnesemia  being replenished  COPD  continue O2 via nasal cannula as needed.  patient is an active smoker  Counseled on smoking cessation    Trichomoniasis  asymptomatic  Will treat with Flagyl 500 mg twice a day for 7 days ( day 2)   Anemia  noted on  previous hospitalization as well. Iron panel done during that time Suggested iron deficiency anemia. Will hold off on treatment at this time.  Code Status: Full code  Family Communication: None at bedside  Disposition Plan: Pending PT eval    Consultants:  Discussed with Dr Cathie Hoops over the phone. PCCM to see pt in am   Procedures:  None   Antibiotics:  Metronidazole started on 1/4  HPI/Subjective:  Admission H&P reviewed. Patient denies any significant symptoms however informs of almost 7 pound weight loss for past few weeks.   HPI/Subjective: Patient denies any symptoms. However had persistent hacking cough overnight. She continues to be very weak with poor appetite. Chest x-ray all pain yesterday suggested persistent right upper lobe pneumonia with numerous infected bullae  and nucleated right upper lobe pleural effusion.  Objective: Filed Vitals:   02/13/12 2200 02/14/12 0030 02/14/12 0600 02/14/12 0932  BP: 88/50 100/70 91/60   Pulse: 100 100 90   Temp: 98.8 F (37.1 C)  98.4 F (36.9 C)   TempSrc: Oral  Oral   Resp: 18  16   Height:      Weight:      SpO2: 95%  97% 98%    Intake/Output Summary (Last 24 hours) at 02/14/12 1146 Last data filed at 02/14/12 1000  Gross per 24 hour  Intake   1680 ml  Output    300 ml  Net   1380 ml   Filed Weights   02/12/12 2115  Weight: 32.432 kg (71 lb 8 oz)    Exam:  General: Middle aged cachectic female in no acute distress, poorly groomed  HEENT: No  pallor, dry oral mucosa, no icterus, no cervical or axillary lymphadenopathy.  Cardiovascular: Normal S1 and S2, no murmurs rub or gallop  Respiratory: clear auscultation bilaterally no added sounds  Abdomen: Soft, nontender, nondistended, bowel sounds present  Extremities: Thin extremities, no edema  CNS: AAO x3, appears fatigued.   Data Reviewed: Basic Metabolic Panel:  Lab 02/14/12 1610 02/13/12 1845 02/13/12 1142 02/13/12 0530 02/12/12 1629  NA 124* 123* 121*  119* 118*  K 2.7* 3.0* 4.2 4.2 3.3*  CL 92* 91* 89* 85* 75*  CO2 20 20 19 21 21   GLUCOSE 99 147* 102* 90 73  BUN <3* <3* <3* 3* 5*  CREATININE 0.24* 0.29* 0.33* 0.30* 0.37*  CALCIUM 8.0* 8.2* 8.8 8.7 9.6  MG 1.2* -- -- -- --  PHOS 2.3 -- -- -- --   Liver Function Tests:  Lab 02/12/12 1629  AST 20  ALT 7  ALKPHOS 121*  BILITOT 0.8  PROT 8.3  ALBUMIN 3.0*   No results found for this basename: LIPASE:5,AMYLASE:5 in the last 168 hours No results found for this basename: AMMONIA:5 in the last 168 hours CBC:  Lab 02/14/12 0527 02/12/12 1629  WBC 9.0 13.7*  NEUTROABS -- 11.4*  HGB 9.5* 14.5  HCT 26.6* 39.2  MCV 73.3* 72.9*  PLT 366 316   Cardiac Enzymes: No results found for this basename: CKTOTAL:5,CKMB:5,CKMBINDEX:5,TROPONINI:5 in the last 168 hours BNP (last 3 results)  Basename 10/28/11 2052  PROBNP 133.2*   CBG: No results found for this basename: GLUCAP:5 in the last 168 hours  Recent Results (from the past 240 hour(s))  URINE CULTURE     Status: Normal   Collection Time   02/12/12  9:45 PM      Component Value Range Status Comment   Specimen Description URINE, RANDOM   Final    Special Requests NONE   Final    Culture  Setup Time 02/13/2012 02:29   Final    Colony Count NO GROWTH   Final    Culture NO GROWTH   Final    Report Status 02/14/2012 FINAL   Final      Studies: Dg Chest 2 View  02/13/2012  *RADIOLOGY REPORT*  Clinical Data: Shortness of breath.  Cough.  Follow up necrotizing pneumonia superimposed upon severe COPD.  CHEST - 2 VIEW  Comparison: Two-view chest x-ray 11/06/2011, 11/03/2011.  CT chest 11/08/2011.  Findings: Persistent airspace opacities in the right upper lobe with multiple air-fluid levels consistent with necrosis and infected bullae.  New loculated pleural effusion laterally at the right apex.  New patchy airspace opacities in the superior segment right lower lobe.  Left lung remains emphysematous but clear. Cardiomediastinal silhouette  unremarkable and unchanged.  IMPRESSION: Since the prior examinations from late September, 2013, persistent right upper lobe pneumonia with numerous infected bullae.  New loculated pleural effusion laterally at the right apex.  New patchy pneumonia in the superior segment right lower lobe.   Original Report Authenticated By: Hulan Saas, M.D.    Ct Chest W Contrast  02/14/2012  *RADIOLOGY REPORT*  Clinical Data: Pneumonia  CT CHEST WITH CONTRAST  Technique:  Multidetector CT imaging of the chest was performed following the standard protocol during bolus administration of intravenous contrast.  Contrast: 80mL OMNIPAQUE IOHEXOL 300 MG/ML  SOLN  Comparison: Chest radiograph 02/13/2012, chest CT 11/08/2011  Findings: Diffuse confluent right upper lobe airspace consolidation is noted with air bronchogram formation and areas of probable cavitation versus cystic bronchiectasis.  Central airways are  patent.  Diffuse emphysematous changes are noted elsewhere.  Small right pleural effusion.  Left apical scarring again noted.  Heart size is normal.  Mild prominence of right hilar lymph nodes, representative 7 mm short-axis diameter lymph node image 27, likely reactive.  Coronary arterial calcification noted.  Trace pericardial fluid.  Great vessels are normal in caliber.  IMPRESSION: New right upper lobe dense consolidation compatible with pneumonia. This could be an element of cystic bronchiectasis versus cavitation.  Trace right parapneumonic effusion.  Severe emphysematous changes.   Original Report Authenticated By: Christiana Pellant, M.D.     Scheduled Meds:   . demeclocycline  300 mg Oral Q12H  . enoxaparin (LOVENOX) injection  30 mg Subcutaneous Q24H  . feeding supplement  237 mL Oral BID BM  . influenza  inactive virus vaccine  0.5 mL Intramuscular Tomorrow-1000  . levofloxacin  750 mg Oral Daily  . magnesium sulfate 1 - 4 g bolus IVPB  2 g Intravenous Once  . metroNIDAZOLE  500 mg Oral Q12H  .  mometasone-formoterol  2 puff Inhalation BID  . pneumococcal 23 valent vaccine  0.5 mL Intramuscular Tomorrow-1000  . potassium chloride  10 mEq Intravenous Q1 Hr x 4  . sodium chloride  3 mL Intravenous Q12H  . tiotropium  18 mcg Inhalation Daily   Continuous Infusions:   . sodium chloride 125 mL/hr at 02/14/12 1115      Time spent: 25 minutes    Keylee Shrestha  [facility] Pager [phone] If 8PM-8AM, please contact night-coverage at [email or web address], password [facility] 02/14/2012, 11:46 AM  LOS: 2 days

## 2012-02-14 NOTE — Progress Notes (Signed)
I have just taken her to rm. 1441 per w/c without incident.  She had been reluctant to agree to transfer; and with our A.C.'s council, ultimately (after much profanity), she agreed to the transfer.  I reported to [address] west nurse [address]. Pt. Transferred in no distress.

## 2012-02-14 NOTE — Progress Notes (Signed)
02-14-12  NSG:   Pt c/o of dry, hacky, non productive cough last night at hs, none noted, Lenny Pastel on call and notified and new orders received and pt reported relief, Continues to refuse some meds and tonight it was her Lovenox, and flagyl,  PO intake remains poor and BP's low (88-100/ 50-70's),  Pt asymptomatic, no changes, remains ST on the monitor rate 110, K+  Is 2.7 this AM and MD notified and new orders received and day Rn informed of new orders for PO and IV runs of K+

## 2012-02-14 NOTE — Progress Notes (Addendum)
Arrived to floor, reviewed safety plan.  Patient states she wants to go home.  Refusing vaccines, TB testing today.  States she will agree tomorrow.

## 2012-02-14 NOTE — Progress Notes (Signed)
02-14-12 NSG:  Pt continues to be incont - unable to collect urine samples.

## 2012-02-15 DIAGNOSIS — R0902 Hypoxemia: Secondary | ICD-10-CM

## 2012-02-15 DIAGNOSIS — J9610 Chronic respiratory failure, unspecified whether with hypoxia or hypercapnia: Secondary | ICD-10-CM

## 2012-02-15 DIAGNOSIS — J189 Pneumonia, unspecified organism: Secondary | ICD-10-CM | POA: Diagnosis present

## 2012-02-15 DIAGNOSIS — J449 Chronic obstructive pulmonary disease, unspecified: Secondary | ICD-10-CM

## 2012-02-15 DIAGNOSIS — J9611 Chronic respiratory failure with hypoxia: Secondary | ICD-10-CM | POA: Diagnosis present

## 2012-02-15 LAB — BASIC METABOLIC PANEL
BUN: <3 mg/dL — ABNORMAL LOW (ref 6–23)
BUN: <3 mg/dL — ABNORMAL LOW (ref 6–23)
CO2: 22 mEq/L (ref 19–32)
CO2: 23 mEq/L (ref 19–32)
Calcium: 8.3 mg/dL — ABNORMAL LOW (ref 8.4–10.5)
Chloride: 93 mEq/L — ABNORMAL LOW (ref 96–112)
Creatinine, Ser: 0.22 mg/dL — ABNORMAL LOW (ref 0.50–1.10)
GFR calc non Af Amer: >90 mL/min (ref >90)
Glucose, Bld: 97 mg/dL (ref 70–99)
Glucose, Bld: 80 mg/dL (ref 70–99)
Glucose, Bld: 78 mg/dL (ref 70–99)
Potassium: 3.1 mEq/L — ABNORMAL LOW (ref 3.5–5.1)
Sodium: 124 mEq/L — ABNORMAL LOW (ref 135–145)

## 2012-02-15 LAB — TSH: TSH: 1.887 u[IU]/mL (ref 0.350–4.500)

## 2012-02-15 LAB — PHOSPHORUS: Phosphorus: 2.3 mg/dL (ref 2.3–4.6)

## 2012-02-15 MED ORDER — POTASSIUM CHLORIDE CRYS ER 20 MEQ PO TBCR
40.0000 meq | EXTENDED_RELEASE_TABLET | Freq: Once | ORAL | Status: DC
  Filled 2012-02-15: qty 2

## 2012-02-15 NOTE — Consult Note (Signed)
PULMONARY  / CRITICAL CARE MEDICINE  Name: Jennifer Hurley MRN: [id number] DOB: Dec 17, 1955    LOS: 3  REFERRING PROVIDER:  Theda Belfast Dhungel  CHIEF COMPLAINT:  Weakness  BRIEF PATIENT DESCRIPTION:  57 yo female admitted on 02/12/2012 with weakness, diarrhea, anorexia with hyponatremia associated with weight loss.  She was found to have RUL pneumonia on CT chest with BTX and cavitation.  PCCM consulted 1/06 to further assess.  She was hospitalized in September 2013 for Rt upper lobe pneumonia.  Of note is that her mother had TB as a child.  Previously followed by Dr. Marchelle Gearing as outpt.  Significant PMHx of Severe COPD/emphysema, Hyponatremia, ETOH, Cocaine abuse, systolic CHF   LINES / TUBES: PIV  CULTURES: HIV 1/05 >> non reactive Sputum 1/05 >> Sputum AFB 1/05 >>  ANTIBIOTICS: Flagyl 1/04 >>  EVENTS:  1/03 Admit  TESTS: 05/17/07 CT chest >> severe centrilobular emphysema, spiculated opacities 9 mm in apices 12/06/07 CT chest >> no change 05/23/08 CT chest >> no change 10/28/11 CT chest >> RUL consolidation 11/08/11 CT chest >> decreased RUL ASD 02/14/11 CT chest >> RUL ASD with cavitation and BTX, diffuse emphysema, small Rt effusion, Lt apical scar   HISTORY OF PRESENT ILLNESS:   57 yo female admitted with weakness, anorexia, wt loss with hyponatremia and polydipsia.  She was treated for Rt upper lobe pneumonia in September 2013.  She reports her mother had TB as a child.  She no longer smokes >> can't afford cigarettes.  She denies recent use of alcohol or cocaine.  She carries a diagnosis of rheumatoid arthritis, but denies every being told she has this.    She denies sore throat, dysphagia, sinus congestion, gland swelling, fever, chills, sweats, hemoptysis, cough, sputum, or chest pain.  PAST MEDICAL HISTORY :  Past Medical History  Diagnosis Date  . Rheumatoid arthritis   . Bronchitis   . Myocardial infarction   . Pneumonia    Past Surgical History  Procedure Date    . Tonsillectomy   . Tubal ligation    Prior to Admission medications   Medication Sig Start Date End Date Taking? Authorizing Provider  albuterol (PROVENTIL) (5 MG/ML) 0.5% nebulizer solution Take 0.5 mLs (2.5 mg total) by nebulization every 6 (six) hours as needed for wheezing or shortness of breath. 11/02/11  Yes Alison Murray, MD  demeclocycline (DECLOMYCIN) 150 MG tablet Take 2 tablets (300 mg total) by mouth every 12 (twelve) hours. may cause photosensitivity; discontinue if skin erythema occurs. Use skin protection and avoid prolonged exposure to sunlight; do not use tanning equipment. 11/13/11  Yes Standley Brooking, MD  feeding supplement (ENSURE COMPLETE) LIQD Take 237 mLs by mouth 2 (two) times daily between meals. 11/13/11  Yes Standley Brooking, MD  Fluticasone-Salmeterol (ADVAIR DISKUS) 250-50 MCG/DOSE AEPB Inhale 1 puff into the lungs 2 (two) times daily. 11/02/11  Yes Alison Murray, MD  tiotropium (SPIRIVA) 18 MCG inhalation capsule Place 1 capsule (18 mcg total) into inhaler and inhale daily. 11/02/11  Yes Alison Murray, MD   No Known Allergies  FAMILY HISTORY:  Family History  Problem Relation Age of Onset  . Coronary artery disease    . Diabetes type II     SOCIAL HISTORY:  reports that she has been smoking.  She has never used smokeless tobacco. She reports that she drinks alcohol. She reports that she uses illicit drugs (Cocaine and Marijuana).  REVIEW OF SYSTEMS:   Negative except above.  INTERVAL HISTORY:  She is frustrated about being in hospital again.  VITAL SIGNS: Temp:  [97.2 F (36.2 C)-97.9 F (36.6 C)] 97.2 F (36.2 C) (01/06 0435) Pulse Rate:  [85-105] 85  (01/06 0435) Resp:  [18] 18  (01/06 0435) BP: (86-110)/(59-73) 110/73 mmHg (01/06 0435) SpO2:  [96 %-99 %] 96 % (01/06 0921)  PHYSICAL EXAMINATION: General:  Thin, no distress Neuro:  Normal strength, CN intact, follows commands, easily agitated HEENT:  No sinus tenderness, no oral exudate, no  LAN Cardiovascular:  s1s2 regular, no murmur Lungs:  Decreased breath sounds, prolonged exhalation, bronchial breath sounds RUL Abdomen:  Soft, non tender, no masses Musculoskeletal:  No edema Skin:  No rashes   Lab 02/15/12 0320 02/14/12 1905 02/14/12 1049  NA 124* 123* 123*  K 2.9* 3.4* 3.5  CL 93* 91* 90*  CO2 22 21 22   BUN <3* <3* <3*  CREATININE 0.22* 0.24* 0.25*  GLUCOSE 97 96 120*    Lab 02/14/12 0527 02/12/12 1629  HGB 9.5* 14.5  HCT 26.6* 39.2  WBC 9.0 13.7*  PLT 366 316   Ct Chest W Contrast  02/14/2012  *RADIOLOGY REPORT*  Clinical Data: Pneumonia  CT CHEST WITH CONTRAST  Technique:  Multidetector CT imaging of the chest was performed following the standard protocol during bolus administration of intravenous contrast.  Contrast: 80mL OMNIPAQUE IOHEXOL 300 MG/ML  SOLN  Comparison: Chest radiograph 02/13/2012, chest CT 11/08/2011  Findings: Diffuse confluent right upper lobe airspace consolidation is noted with air bronchogram formation and areas of probable cavitation versus cystic bronchiectasis.  Central airways are patent.  Diffuse emphysematous changes are noted elsewhere.  Small right pleural effusion.  Left apical scarring again noted.  Heart size is normal.  Mild prominence of right hilar lymph nodes, representative 7 mm short-axis diameter lymph node image 27, likely reactive.  Coronary arterial calcification noted.  Trace pericardial fluid.  Great vessels are normal in caliber.  IMPRESSION: New right upper lobe dense consolidation compatible with pneumonia. This could be an element of cystic bronchiectasis versus cavitation.  Trace right parapneumonic effusion.  Severe emphysematous changes.   Original Report Authenticated By: Christiana Pellant, M.D.     ASSESSMENT / PLAN:  57 yo female with persistent vs recurrent Rt upper lobe infiltrate. Plan: F/u sputum AFB >> may need to induce sputum with RT May need bronchoscopy with airway sampling No indication that she has  acute bacterial infection >> agree with holding antibiotics F/u PA/Lateral CXR 1/07 Check Quantiferon gold assay  Severe COPD/Emphysema. Plan: Continue dulera with prn albuterol  Chronic hypoxic respiratory failure 2nd to COPD. Plan: Supplemental oxygen to keep SpO2 > 92%  Acute on chronic hyponatremia (has been present since 2009). Cortisol 14.6 from 11/04/11. Plan: Repeat cortisol level, TSH Rer hospitalist team   Coralyn Helling, MD [facility] Pulmonary/Critical Care 02/15/2012, 11:40 AM Pager:  [phone] After 3pm call: [phone]

## 2012-02-15 NOTE — Consult Note (Signed)
Reason for Consult: Hyponatremia Referring Physician: Dr. Gonzella Lex   HPI:  Jennifer Hurley is an 57 y.o. female with past medical history significant for questionable rheumatoid arthritis, bronchitis/COPD, coronary artery disease, polysubstance abuse with chronic malnutrition and failure to thrive. She also has a long-standing history of hyponatremia dating back to 2009. She was on demeclocycline as an outpatient for this issue. The etiology of her hyponatremia is not entirely clear cut and that is the reason for consultation. When patient was admitted this time and the last time she was noted to be very volume depleted and sodium did improve with normal saline administration. Both times her urine osmolality is been checked it's been appropriately low. This is likely the effect of the demeclocycline for which she is on for an element of SIADH. She reportedly drinks a large amount of water but doesn't always eat so much. She is unable to give me an explanation for why that is. This time, a CT scan showed a change in her lungs on a possible cavitary lesion. Therefore, right now she's being ruled out for TB.  She's had intermittent outbursts, threatening to leave AMA. Today to my interview she seems disinterested. She does not complain of thirst. She has received nearly 5 L of normal saline and still has not developed any lower extremity edema.    Trend in Creatinine: Creatinine, Ser  Date/Time Value Range Status  02/15/2012 12:30 PM 0.27* 0.50 - 1.10 mg/dL Final  02/14/1094  0:45 AM 0.22* 0.50 - 1.10 mg/dL Final  4/0/9811  9:14 PM 0.24* 0.50 - 1.10 mg/dL Final  08/17/2954 21:30 AM 0.25* 0.50 - 1.10 mg/dL Final  09/15/5782  6:96 AM 0.24* 0.50 - 1.10 mg/dL Final  03/20/5282  1:32 PM 0.29* 0.50 - 1.10 mg/dL Final  05/13/100 72:53 AM 0.33* 0.50 - 1.10 mg/dL Final  07/15/4401  4:74 AM 0.30* 0.50 - 1.10 mg/dL Final  03/16/9561  8:75 PM 0.37* 0.50 - 1.10 mg/dL Final  64/04/3293  1:88 AM 0.67  0.50 - 1.10 mg/dL Final    41/07/6061  0:16 AM 0.64  0.50 - 1.10 mg/dL Final  01/0/9323  5:57 AM 0.54  0.50 - 1.10 mg/dL Final  32/03/252  2:70 AM 0.47* 0.50 - 1.10 mg/dL Final  08/02/7626  3:15 AM 0.41* 0.50 - 1.10 mg/dL Final  1/76/1607  3:71 AM 0.47* 0.50 - 1.10 mg/dL Final  0/62/6948  5:46 AM 0.42* 0.50 - 1.10 mg/dL Final  2/70/3500  9:38 AM 0.40* 0.50 - 1.10 mg/dL Final  1/82/9937  1:69 PM 0.44* 0.50 - 1.10 mg/dL Final  6/78/9381  0:17 AM 0.37* 0.50 - 1.10 mg/dL Final  06/19/2583  2:77 PM 0.34* 0.50 - 1.10 mg/dL Final  10/03/2351  6:14 PM 0.33* 0.50 - 1.10 mg/dL Final  4/31/5400 86:76 AM 0.35* 0.50 - 1.10 mg/dL Final  1/95/0932  6:71 AM 0.37* 0.50 - 1.10 mg/dL Final  2/45/8099  8:33 AM 0.37* 0.50 - 1.10 mg/dL Final  10/03/537 76:73 PM 0.36* 0.50 - 1.10 mg/dL Final  05/28/3788  2:40 PM 0.35* 0.50 - 1.10 mg/dL Final  9/73/5329  9:24 PM 0.38* 0.50 - 1.10 mg/dL Final  2/68/3419 62:22 AM 0.33* 0.50 - 1.10 mg/dL Final  9/79/8921  1:94 AM 0.34* 0.50 - 1.10 mg/dL Final  1/74/0814 48:18 PM 0.36* 0.50 - 1.10 mg/dL Final  5/63/1497  0:26 AM 0.38* 0.50 - 1.10 mg/dL Final  3/78/5885  0:27 AM 0.27* 0.50 - 1.10 mg/dL Final  7/41/2878  6:76 PM 0.26* 0.50 - 1.10  mg/dL Final  1/61/0960  4:54 AM 0.22* 0.50 - 1.10 mg/dL Final  0/98/1191  4:78 PM 0.30* 0.50 - 1.10 mg/dL Final  2/95/6213  0:86 AM 0.26* 0.50 - 1.10 mg/dL Final  5/78/4696  2:95 PM 0.30* 0.50 - 1.10 mg/dL Final  2/84/1324  4:01 PM 0.53  0.40-1.20 mg/dL Final  0/27/2536 64:40 PM 0.55  0.40-1.20 mg/dL Final  3/47/4259  5:63 PM 0.77  0.40-1.20 mg/dL Final  09/15/5641  3:29 PM 0.69  0.40-1.20 mg/dL Final  51/09/8414  6:06 PM 0.68  0.40-1.20 mg/dL Final  3/0/1601 09:32 PM 0.48  0.40-1.20 mg/dL Final  3/55/7322  0:25 AM 0.47   Final  06/07/2007  4:35 AM 0.59   Final  06/05/2007  3:53 AM 0.47   Final  06/04/2007  3:10 AM 0.46   Final  06/03/2007  3:35 AM 0.46   Final  06/02/2007  4:30 AM 0.55   Final  06/01/2007  3:35 AM 0.49   Final  05/31/2007  4:09 AM 0.38 REPEATED TO  VERIFY DELTA CHECK NOTED*  Final  05/30/2007  4:00 AM 0.67   Final   Sodium  Date/Time Value Range Status  02/15/2012 12:30 PM 124* 135 - 145 mEq/L Final  02/15/2012  3:20 AM 124* 135 - 145 mEq/L Final  02/14/2012  7:05 PM 123* 135 - 145 mEq/L Final  02/14/2012 10:49 AM 123* 135 - 145 mEq/L Final  02/14/2012  5:26 AM 124* 135 - 145 mEq/L Final  02/13/2012  6:45 PM 123* 135 - 145 mEq/L Final  02/13/2012 11:42 AM 121* 135 - 145 mEq/L Final  02/13/2012  5:30 AM 119* 135 - 145 mEq/L Final     CRITICAL RESULT CALLED TO, READ BACK BY AND VERIFIED WITH:     M. HOWLETT AT 4270 ON 01.04.14 BY HOBBINS, J.  02/12/2012  4:29 PM 118* 135 - 145 mEq/L Final     CRITICAL RESULT CALLED TO, READ BACK BY AND VERIFIED WITH:     KEFLER,R. AT 1721 ON [id number] BY LOVE,T.  11/13/2011  5:30 AM 127* 135 - 145 mEq/L Final  11/12/2011  3:58 AM 125* 135 - 145 mEq/L Final  11/11/2011  5:20 AM 126* 135 - 145 mEq/L Final  11/10/2011  4:35 AM 123* 135 - 145 mEq/L Final  11/09/2011  4:53 AM 127* 135 - 145 mEq/L Final  11/08/2011  5:23 AM 131* 135 - 145 mEq/L Final  11/07/2011  4:43 AM 134* 135 - 145 mEq/L Final  11/06/2011 11:40 PM 135  135 - 145 mEq/L Final     RESULT REPEATED AND VERIFIED     DELTA CHECK NOTED  11/06/2011  4:57 PM 125* 135 - 145 mEq/L Final  11/06/2011  5:00 AM 122* 135 - 145 mEq/L Final  11/05/2011  1:00 PM 125* 135 - 145 mEq/L Final  11/05/2011  4:38 AM 125* 135 - 145 mEq/L Final  11/04/2011  8:41 PM 120* 135 - 145 mEq/L Final  11/04/2011  2:58 PM 120* 135 - 145 mEq/L Final  11/04/2011 10:53 AM 124* 135 - 145 mEq/L Final  11/04/2011  7:04 AM 124* 135 - 145 mEq/L Final  11/04/2011  3:00 AM 126* 135 - 145 mEq/L Final  11/03/2011 11:17 PM 124* 135 - 145 mEq/L Final  11/03/2011  7:22 PM 123* 135 - 145 mEq/L Final  11/03/2011  2:50 PM 123* 135 - 145 mEq/L Final  11/03/2011 11:05 AM 121* 135 - 145 mEq/L Final  11/03/2011  6:42 AM 122* 135 - 145 mEq/L  Final  11/02/2011 11:17 PM 120* 135 - 145 mEq/L Final     DELTA CHECK NOTED      REPEATED TO VERIFY  11/01/2011  4:18 AM 127* 135 - 145 mEq/L Final  10/31/2011  5:34 AM 124* 135 - 145 mEq/L Final  10/30/2011  2:51 PM 122* 135 - 145 mEq/L Final  10/30/2011  3:20 AM 120* 135 - 145 mEq/L Final  10/29/2011  2:36 PM 122* 135 - 145 mEq/L Final  10/29/2011  3:20 AM 123* 135 - 145 mEq/L Final  10/28/2011  1:40 PM 119* 135 - 145 mEq/L Final     CRITICAL RESULT CALLED TO, READ BACK BY AND VERIFIED WITH:     [id number]  08/21/2009  8:18 PM 125* 135-145 meq/L Final  07/30/2009 10:50 PM 125* 135-145 meq/L Final  06/19/2008  9:37 PM 135  135-145 meq/L Final  04/11/2008  8:43 PM 134* 135-145 meq/L Final  11/17/2007  9:42 PM 132* 135-145 meq/L Final  10/18/2007 10:53 PM 131* 135-145 meq/L Final  06/09/2007  3:15 AM 126*  Final  06/07/2007  4:35 AM 132*  Final  06/05/2007  3:53 AM 132*  Final  06/04/2007  3:10 AM 136   Final  06/03/2007  3:35 AM 138   Final  06/02/2007  4:30 AM 136   Final  06/01/2007  3:35 AM 137   Final   PMH:   Past Medical History  Diagnosis Date  . Rheumatoid arthritis   . Bronchitis   . Myocardial infarction   . Pneumonia     PSH:   Past Surgical History  Procedure Date  . Tonsillectomy   . Tubal ligation     Allergies: No Known Allergies  Medications:   Prior to Admission medications   Medication Sig Start Date End Date Taking? Authorizing Provider  albuterol (PROVENTIL) (5 MG/ML) 0.5% nebulizer solution Take 0.5 mLs (2.5 mg total) by nebulization every 6 (six) hours as needed for wheezing or shortness of breath. 11/02/11  Yes Alison Murray, MD  demeclocycline (DECLOMYCIN) 150 MG tablet Take 2 tablets (300 mg total) by mouth every 12 (twelve) hours. may cause photosensitivity; discontinue if skin erythema occurs. Use skin protection and avoid prolonged exposure to sunlight; do not use tanning equipment. 11/13/11  Yes Standley Brooking, MD  feeding supplement (ENSURE COMPLETE) LIQD Take 237 mLs by mouth 2 (two) times daily between meals. 11/13/11  Yes  Standley Brooking, MD  Fluticasone-Salmeterol (ADVAIR DISKUS) 250-50 MCG/DOSE AEPB Inhale 1 puff into the lungs 2 (two) times daily. 11/02/11  Yes Alison Murray, MD  tiotropium (SPIRIVA) 18 MCG inhalation capsule Place 1 capsule (18 mcg total) into inhaler and inhale daily. 11/02/11  Yes Alison Murray, MD    Discontinued Meds:   Medications Discontinued During This Encounter  Medication Reason  . fluconazole (DIFLUCAN) 150 MG tablet Completed Course  . furosemide (LASIX) 40 MG tablet Patient has not taken in last 30 days  . guaiFENesin-dextromethorphan (ROBITUSSIN DM) 100-10 MG/5ML syrup Patient Preference  . HYDROcodone-acetaminophen (NORCO/VICODIN) 5-325 MG per tablet Completed Course  . nicotine (NICODERM CQ - DOSED IN MG/24 HOURS) 14 mg/24hr patch Patient has not taken in last 30 days  . potassium chloride (K-DUR) 10 MEQ tablet Patient has not taken in last 30 days  . sulfamethoxazole-trimethoprim (BACTRIM DS) 800-160 MG per tablet Patient has not taken in last 30 days  . enoxaparin (LOVENOX) injection 40 mg Dose change  . pneumococcal 13-valent conjugate vaccine (PREVNAR 13) injection 0.5 mL  Formulary change  . potassium chloride SA (K-DUR,KLOR-CON) CR tablet 20 mEq   . levofloxacin (LEVAQUIN) tablet 750 mg     Social History:  reports that she has been smoking.  She has never used smokeless tobacco. She reports that she drinks alcohol. She reports that she uses illicit drugs (Cocaine and Marijuana).  Family History:   Family History  Problem Relation Age of Onset  . Coronary artery disease    . Diabetes type II      A comprehensive review of systems was negative except for: Constitutional: positive for anorexia and malaise Respiratory: positive for cough and dyspnea on exertion Gastrointestinal: positive for dyspepsia Behavioral/Psych: positive for aggressive behavior and depression  Blood pressure 125/80, pulse 92, temperature 98.2 F (36.8 C), temperature source Oral, resp.  rate 16, height 5\' 1"  (1.549 m), weight 32.432 kg (71 lb 8 oz), SpO2 100.00%. General appearance: distracted and no distress Eyes: conjunctivae/corneas clear. PERRL, EOM's intact. Fundi benign. Neck: no adenopathy, no carotid bruit, no JVD, supple, symmetrical, trachea midline and thyroid not enlarged, symmetric, no tenderness/mass/nodules Resp: rhonchi bilaterally Cardio: regular rate and rhythm, S1, S2 normal, no murmur, click, rub or gallop GI: soft, non-tender; bowel sounds normal; no masses,  no organomegaly Extremities: extremities normal, atraumatic, no cyanosis or edema Skin: Skin color, texture, turgor normal. No rashes or lesions or skin dry  Labs: Basic Metabolic Panel:  Lab 02/15/12 1610 02/15/12 0320 02/14/12 1905 02/14/12 1049 02/14/12 0526 02/13/12 1845 02/13/12 1142 02/12/12 1629  NA 124* 124* 123* 123* 124* 123* 121* --  K 3.3* 2.9* 3.4* 3.5 2.7* 3.0* 4.2 --  CL 91* 93* 91* 90* 92* 91* 89* --  CO2 23 22 21 22 20 20 19  --  GLUCOSE 80 97 96 120* 99 147* 102* --  BUN <3* <3* <3* <3* <3* <3* <3* --  CREATININE 0.27* 0.22* 0.24* 0.25* 0.24* 0.29* 0.33* --  ALBUMIN -- -- -- -- -- -- -- 3.0*  CALCIUM 8.3* 7.8* 7.9* 8.1* 8.0* 8.2* 8.8 --  PHOS -- 2.3 -- -- 2.3 -- -- --   Liver Function Tests:  Lab 02/12/12 1629  AST 20  ALT 7  ALKPHOS 121*  BILITOT 0.8  PROT 8.3  ALBUMIN 3.0*   No results found for this basename: LIPASE:3,AMYLASE:3 in the last 168 hours No results found for this basename: AMMONIA:3 in the last 168 hours CBC:  Lab 02/14/12 0527 02/12/12 1629  WBC 9.0 13.7*  NEUTROABS -- 11.4*  HGB 9.5* 14.5  HCT 26.6* 39.2  MCV 73.3* 72.9*  PLT 366 316   PT/INR: @labrcntip (inr:5) Cardiac Enzymes: No results found for this basename: CKTOTAL:5,CKMB:5,CKMBINDEX:5,TROPONINI:5 in the last 168 hours CBG: No results found for this basename: GLUCAP:5 in the last 168 hours  Iron Studies: No results found for this basename:  IRON:30,TIBC:30,TRANSFERRIN:30,FERRITIN:30 in the last 168 hours  Xrays/Other Studies: Ct Chest W Contrast  02/14/2012  *RADIOLOGY REPORT*  Clinical Data: Pneumonia  CT CHEST WITH CONTRAST  Technique:  Multidetector CT imaging of the chest was performed following the standard protocol during bolus administration of intravenous contrast.  Contrast: 80mL OMNIPAQUE IOHEXOL 300 MG/ML  SOLN  Comparison: Chest radiograph 02/13/2012, chest CT 11/08/2011  Findings: Diffuse confluent right upper lobe airspace consolidation is noted with air bronchogram formation and areas of probable cavitation versus cystic bronchiectasis.  Central airways are patent.  Diffuse emphysematous changes are noted elsewhere.  Small right pleural effusion.  Left apical scarring again noted.  Heart size is normal.  Mild  prominence of right hilar lymph nodes, representative 7 mm short-axis diameter lymph node image 27, likely reactive.  Coronary arterial calcification noted.  Trace pericardial fluid.  Great vessels are normal in caliber.  IMPRESSION: New right upper lobe dense consolidation compatible with pneumonia. This could be an element of cystic bronchiectasis versus cavitation.  Trace right parapneumonic effusion.  Severe emphysematous changes.   Original Report Authenticated By: Christiana Pellant, M.D.      Assessment/Plan: 57 year old black female with chronic hyponatremia the in the setting of lung disease. She also has significant malnutrition and volume depletion and failure to thrive issues. 1. Hyponatremia- etiology is not entirely clear-cut.  I do believe she has intermittent episodes of being very volume depleted and having hypovolemic hyponatremia. When she has this going on it responds appropriately to normal saline administration as has been the case this hospitalization. 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 I agree with management to date. I think her tank is getting close to being full. A clue to that would be if she were to develop any lower extremity edema at all. However because she does not have it now I would continue with normal saline administration at least one more day. I also agree with the diet encouraging caloric intake as opposed to water intake with a moderate, water restriction. Given my historical review of her sodium levels in the past, I would be surprised if her sodium got much higher than 130.  I agree with checking cortisol and TSH to rule out other etiologies. 2. Volume- still appears dry to me. Would continue normal saline administration.   Thank you for this consultation, we will follow with you.     Eviana Sibilia A 02/15/2012, 3:49 PM

## 2012-02-15 NOTE — Progress Notes (Signed)
   CARE MANAGEMENT NOTE 02/15/2012  Patient:  Jennifer Hurley, Jennifer Hurley   Account Number:  [id number]  Date Initiated:  02/15/2012  Documentation initiated by:  Jiles Crocker  Subjective/Objective Assessment:   ADMITTED WITH DEHYDRATION     Action/Plan:   PT KNOWN TO CM- WAS GIVEN INFORMATION ON FINDING A PCP 10/2011, PATIENT WAS TO FOLLOW UP WITH DR Mikeal Hawthorne ON 11/16/2011; PATIENT WAS INFORMED ABOUT THE [facility] ALSO; PT WAS ACTIVE WITH [facility] UNTIL 11/2012   Anticipated DC Date:  02/22/2012   Anticipated DC Plan:  HOME/SELF CARE      DC Planning Services  CM consult          Status of service:  In process, will continue to follow Medicare Important Message given?   (If response is "NO", the following Medicare IM given date fields will be blank) Per UR Regulation:  Reviewed for med. necessity/level of care/duration of stay  Comments:  02/15/2012- B Kyri Dai RN,BSN,MHA

## 2012-02-15 NOTE — Progress Notes (Signed)
[facility] PROGRESS NOTE  Jennifer Hurley QIO:[id number] DOB: November 25, 1955 DOA: 02/12/2012 PCP: Lonia Blood, MD  Brief narrative:  57 year old malnourished female with history of COPD admitted for weakness and failure to thrive with malnutrition and hyponatremia.   Assessment/Plan:  Hyponatremia  Patient presents with a recurrent hyponatremia and was admitted few months back with hypovolemic hyponatremia. Patient however has been drinking large-volume water and is placed on fluid restriction. Continue IV normal saline. Monitor serum sodium levels every 6-8 hours to see for improvement or overcorrection. . Minimally improved  Monitor for any change in mental status.  -low serum osmolality and low urine osm noted. Given normal urine Na , seems to be a mixed picture of her hyponatremia. Appreciate renal consult. 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  Recommend to continue fluids for 1 more day. -check TSH and cortisol ( low on admission) -low mg and k being replenished    Weight loss with cavitary lung lesion -patient informs losing 8-10 lbs over past few months  CXR obtained with concern for smoking and active cough with hyponatremia shows persistent RUL pneumonia with infected bullae. A CT chest done  to exclude malignancy shows rt UL dense consolidation compatible with PNA with element of cystic bronchiectasis vs cavitation.  -discussed with PCCM, the new CT findings does suggest cavitary lesion which needs to be ruled out for TB.  placed her on respiratory isolation, obtain sputum for AFB x 3, ordered for PPD and quantiferon gold TB assay. . Hold off any antibiotics.  -nebs prn. mucolytic.  Patient may need bronchoscopy with bx if sptum samples inadequate  Severe malnutrition with failure to thrive.  - nutrition consult. Continue with nutrition supplements.   PT eval.  -HIV ab negative  Hypokalemia/ hypomagnesemia  being replenished    COPD  continue O2 via nasal cannula as needed.  patient is an active smoker  Counseled on smoking cessation   Trichomoniasis  asymptomatic   Flagyl 500 mg twice a day for 7 days ( day 3)   Anemia  noted on previous hospitalization as well. Iron panel done during that time Suggested iron deficiency anemia. Will hold off on treatment at this time.   Code Status: Full code  Family Communication: None at bedside  Disposition Plan: Pending PT eval   Consultants:  PCCM  renal  Procedures:  None   Antibiotics:  Flagyl ( day 3/7)  HPI/Subjective: Patient has been refusing medications and not compliant to giving sputum samples. Reinforced the importance of these tests and agrees to cooperate.   Objective: Filed Vitals:   02/15/12 0114 02/15/12 0435 02/15/12 0921 02/15/12 1300  BP: 93/61 110/73  125/80  Pulse: 91 85  92  Temp:  97.2 F (36.2 C)  98.2 F (36.8 C)  TempSrc:  Oral  Oral  Resp: 18 18  16   Height:      Weight:      SpO2:  99% 96% 100%    Intake/Output Summary (Last 24 hours) at 02/15/12 1655 Last data filed at 02/15/12 0930  Gross per 24 hour  Intake 4044.58 ml  Output    600 ml  Net 3444.58 ml   Filed Weights   02/12/12 2115  Weight: 32.432 kg (71 lb 8 oz)    Exam:  General: Middle aged cachectic female in no acute distress, poorly groomed  HEENT: No pallor, dry oral mucosa, no icterus, no cervical or axillary lymphadenopathy.  Cardiovascular: Normal S1 and S2, no  murmurs rub or gallop  Respiratory: clear auscultation bilaterally no added sounds  Abdomen: Soft, nontender, nondistended, bowel sounds present  Extremities: Thin extremities, no edema  CNS: AAO x3, appears fatigued.   Data Reviewed: Basic Metabolic Panel:  Lab 02/15/12 7829 02/15/12 0320 02/14/12 1905 02/14/12 1049 02/14/12 0526  NA 124* 124* 123* 123* 124*  K 3.3* 2.9* 3.4* 3.5 2.7*  CL 91* 93* 91* 90* 92*  CO2 23 22 21 22 20   GLUCOSE 80 97 96 120* 99  BUN <3* <3* <3*  <3* <3*  CREATININE 0.27* 0.22* 0.24* 0.25* 0.24*  CALCIUM 8.3* 7.8* 7.9* 8.1* 8.0*  MG -- 1.4* -- -- 1.2*  PHOS -- 2.3 -- -- 2.3   Liver Function Tests:  Lab 02/12/12 1629  AST 20  ALT 7  ALKPHOS 121*  BILITOT 0.8  PROT 8.3  ALBUMIN 3.0*   No results found for this basename: LIPASE:5,AMYLASE:5 in the last 168 hours No results found for this basename: AMMONIA:5 in the last 168 hours CBC:  Lab 02/14/12 0527 02/12/12 1629  WBC 9.0 13.7*  NEUTROABS -- 11.4*  HGB 9.5* 14.5  HCT 26.6* 39.2  MCV 73.3* 72.9*  PLT 366 316   Cardiac Enzymes: No results found for this basename: CKTOTAL:5,CKMB:5,CKMBINDEX:5,TROPONINI:5 in the last 168 hours BNP (last 3 results)  Basename 10/28/11 2052  PROBNP 133.2*   CBG: No results found for this basename: GLUCAP:5 in the last 168 hours  Recent Results (from the past 240 hour(s))  URINE CULTURE     Status: Normal   Collection Time   02/12/12  9:45 PM      Component Value Range Status Comment   Specimen Description URINE, RANDOM   Final    Special Requests NONE   Final    Culture  Setup Time 02/13/2012 02:29   Final    Colony Count NO GROWTH   Final    Culture NO GROWTH   Final    Report Status 02/14/2012 FINAL   Final      Studies: Ct Chest W Contrast  02/14/2012  *RADIOLOGY REPORT*  Clinical Data: Pneumonia  CT CHEST WITH CONTRAST  Technique:  Multidetector CT imaging of the chest was performed following the standard protocol during bolus administration of intravenous contrast.  Contrast: 80mL OMNIPAQUE IOHEXOL 300 MG/ML  SOLN  Comparison: Chest radiograph 02/13/2012, chest CT 11/08/2011  Findings: Diffuse confluent right upper lobe airspace consolidation is noted with air bronchogram formation and areas of probable cavitation versus cystic bronchiectasis.  Central airways are patent.  Diffuse emphysematous changes are noted elsewhere.  Small right pleural effusion.  Left apical scarring again noted.  Heart size is normal.  Mild prominence  of right hilar lymph nodes, representative 7 mm short-axis diameter lymph node image 27, likely reactive.  Coronary arterial calcification noted.  Trace pericardial fluid.  Great vessels are normal in caliber.  IMPRESSION: New right upper lobe dense consolidation compatible with pneumonia. This could be an element of cystic bronchiectasis versus cavitation.  Trace right parapneumonic effusion.  Severe emphysematous changes.   Original Report Authenticated By: Christiana Pellant, M.D.     Scheduled Meds:   . demeclocycline  300 mg Oral Q12H  . enoxaparin (LOVENOX) injection  30 mg Subcutaneous Q24H  . feeding supplement  237 mL Oral BID BM  . metroNIDAZOLE  500 mg Oral Q12H  . mometasone-formoterol  2 puff Inhalation BID  . potassium chloride  40 mEq Oral Once  . sodium chloride  3 mL Intravenous  Q12H  . tiotropium  18 mcg Inhalation Daily   Continuous Infusions:   . sodium chloride 125 mL/hr at 02/15/12 0335      Time spent: 30 MINUTES    Renada Cronin  [facility] Pager [phone]. If 8PM-8AM, please contact night-coverage at [email or web address], password [facility] 02/15/2012, 4:55 PM  LOS: 3 days

## 2012-02-16 ENCOUNTER — Inpatient Hospital Stay (HOSPITAL_COMMUNITY): Payer: Medicare Other

## 2012-02-16 LAB — BASIC METABOLIC PANEL
BUN: <3 mg/dL — ABNORMAL LOW (ref 6–23)
BUN: <3 mg/dL — ABNORMAL LOW (ref 6–23)
BUN: <3 mg/dL — ABNORMAL LOW (ref 6–23)
CO2: 20 mEq/L (ref 19–32)
CO2: 23 mEq/L (ref 19–32)
Calcium: 8.2 mg/dL — ABNORMAL LOW (ref 8.4–10.5)
Calcium: 8.6 mg/dL (ref 8.4–10.5)
Calcium: 8.2 mg/dL — ABNORMAL LOW (ref 8.4–10.5)
Chloride: 86 mEq/L — ABNORMAL LOW (ref 96–112)
Chloride: 85 mEq/L — ABNORMAL LOW (ref 96–112)
Creatinine, Ser: 0.26 mg/dL — ABNORMAL LOW (ref 0.50–1.10)
Creatinine, Ser: 0.28 mg/dL — ABNORMAL LOW (ref 0.50–1.10)
GFR calc Af Amer: >90 mL/min (ref >90)
GFR calc Af Amer: >90 mL/min (ref >90)
GFR calc non Af Amer: >90 mL/min (ref >90)
GFR calc non Af Amer: >90 mL/min (ref >90)
GFR calc non Af Amer: >90 mL/min (ref >90)
Glucose, Bld: 70 mg/dL (ref 70–99)
Glucose, Bld: 98 mg/dL (ref 70–99)
Glucose, Bld: 102 mg/dL — ABNORMAL HIGH (ref 70–99)
Potassium: 2.8 mEq/L — ABNORMAL LOW (ref 3.5–5.1)
Potassium: 2.9 mEq/L — ABNORMAL LOW (ref 3.5–5.1)
Sodium: 118 mEq/L — CL (ref 135–145)
Sodium: 120 mEq/L — ABNORMAL LOW (ref 135–145)

## 2012-02-16 LAB — EXPECTORATED SPUTUM ASSESSMENT W GRAM STAIN, RFLX TO RESP C

## 2012-02-16 MED ORDER — POTASSIUM & SODIUM PHOSPHATES 280-160-250 MG PO PACK
1.0000 | PACK | Freq: Three times a day (TID) | ORAL | Status: DC
  Administered 2012-02-16 – 2012-02-17 (×5): 1 via ORAL
  Filled 2012-02-16 (×8): qty 1

## 2012-02-16 MED ORDER — POTASSIUM CHLORIDE CRYS ER 20 MEQ PO TBCR
40.0000 meq | EXTENDED_RELEASE_TABLET | ORAL | Status: AC
  Filled 2012-02-16 (×3): qty 2

## 2012-02-16 MED ORDER — SODIUM CHLORIDE 3 % IN NEBU
15.0000 mL | INHALATION_SOLUTION | Freq: Once | RESPIRATORY_TRACT | Status: DC
  Filled 2012-02-16: qty 15

## 2012-02-16 MED ORDER — POTASSIUM CHLORIDE 20 MEQ/15ML (10%) PO LIQD
40.0000 meq | Freq: Two times a day (BID) | ORAL | Status: AC
  Administered 2012-02-16 (×2): 40 meq via ORAL
  Filled 2012-02-16 (×3): qty 30

## 2012-02-16 MED ORDER — MAGNESIUM SULFATE 40 MG/ML IJ SOLN
2.0000 g | Freq: Once | INTRAMUSCULAR | Status: AC
  Administered 2012-02-16: 2 g via INTRAVENOUS
  Filled 2012-02-16: qty 50

## 2012-02-16 NOTE — Progress Notes (Signed)
CRITICAL VALUE ALERT  Critical value received:  Sodium level 119  Date of notification:  02/16/2012  Time of notification:  2005  Critical value read back:yes  Nurse who received alert:  Ophelia Charter RN  MD notified (1st page):  Donnamarie Poag NP  Time of first page:  2135  MD notified (2nd page):  Time of second page: 2205  Responding MD:  Donnamarie Poag NP  Time MD responded:  [id number]

## 2012-02-16 NOTE — Progress Notes (Signed)
PT Cancellation Note/ Late entry for 02/15/12  Patient Details Name: Jennifer Hurley MRN: [id number] DOB: 1955-10-15   Cancelled Treatment:    Reason Eval/Treat Not Completed: Other (comment) (pt declined x 3  On 02/15/12)   [name] 02/16/2012, 8:12 AM

## 2012-02-16 NOTE — Progress Notes (Signed)
Attempted to administer hypertonic saline neb tx to induce sputum, however pt refused.  Pt stated she was "too cold to do tx".  Pt then coughed up on her own a sample, which was walked to lab for processing.  RN notified.

## 2012-02-16 NOTE — Progress Notes (Signed)
Patient continues to be noncompliant.  Patient educated regarding consequences of low Na, K levels.  Patient continues to refuse PO potassium tablets.  MD informed.  MD will change to liquid potassium; will attempt to administer medication to patient.  Patient's intake continues to be poor.  Patient refuses to eat, drink.  Drinks very small amounts of ensure.  Lab attempting to draw blood from patient now; patient very irritable and using profanity towards phlebotomist.  Will continue to monitor patient.

## 2012-02-16 NOTE — Progress Notes (Addendum)
[facility] PROGRESS NOTE  Jennifer Hurley:[id number] DOB: January 02, 1956 DOA: 02/12/2012 PCP: Lonia Blood, MD  Brief narrative:  57 year old malnourished female with history of COPD admitted for weakness and failure to thrive with malnutrition and hyponatremia. 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  Assessment/Plan:  Hyponatremia  Patient presents with a recurrent hyponatremia and was admitted few months back with hypovolemic hyponatremia. Patient however has been drinking large-volume water and is placed on fluid restriction. She has been positive by almost 7 L since admission however her sodium level has been corrected. Monitor serum sodium levels every 6-8 hours to see for improvement or overcorrection.  Monitor for any change in mental status.  -low serum osmolality and low urine osm noted. Given normal urine Na , seems to be a mixed picture of her hyponatremia. Appreciate renal consult. 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 Recommend to reduce IV normal saline to 50 cc per hour and monitor. Renal consult is with the opinion that her serum sodium may not increase above 130 given with adequate correction -TSH and cortisol within normal limits. -low mg and k being replenished . Recheck in a.m.  Weight loss with cavitary lung lesion  -patient informs losing 8-10 lbs over past few months  CXR obtained with concern for smoking and active cough with hyponatremia shows persistent RUL pneumonia with infected bullae. A CT chest done to exclude malignancy shows rt UL dense consolidation compatible with PNA with element of cystic bronchiectasis vs cavitation.  -discussed with PCCM, the new CT findings does suggest cavitary lesion which needs to be ruled out for TB. placed her on  respiratory isolation, obtain sputum for AFB x 3, ordered for PPD and quantiferon gold TB assay. . Hold off any antibiotics.  -nebs prn. mucolytic.  Patient may need bronchoscopy with bx if sptum samples inadequate or difficult to all pain. Patient has been poorly cooperative in providing sputum samples, and taking her medications. -PPD placed  On 1/6. Continue airborne isolation.   Hypokalemia Patient has persistent hypokalemia with associated hypomagnesemia and hypophosphatemia. She is quite now the distant as well. Replenish him with by mouth KCl (try liquid as patient is not compliant with KCl tablets.) Low magnesium and low normal phosphorus level noted and being replenished. Continue telemetry monitoring.  Severe malnutrition with failure to thrive.  - nutrition consult. Continue with nutrition supplements.  PT eval.  -HIV ab negative   COPD  continue O2 via nasal cannula as needed.  patient is an active smoker  Counseled on smoking cessation   Trichomoniasis  asymptomatic  Flagyl 500 mg twice a day for 7 days ( day4 )   Anemia  noted on previous hospitalization as well. Iron panel done during that time Suggested iron deficiency anemia. Will hold off on treatment at this time.   Issues with compliance to treatment Patient has been acting difficulty with taking medications and providing sputum samples. Has a very poor insight about her medical illness. We'll ask psychiatry to evaluate for competency.  Code Status: Full code  Family Communication: discussed with Son Delisia Mcquiston over the phone on 1/7 Disposition Plan: Pending PT eval .    Consultants:  PCCM  renal  Psychiatry   Procedures:  None  Antibiotics:  Flagyl ( day 4/7)     HPI/Subjective: Patient has been refusing medications  and providing sputum samples. Was counseled again and reinforced about the need of it.  Objective: Filed Vitals:   02/15/12 1300 02/15/12 2054 02/15/12 2137 02/16/12 0705  BP:  125/80  111/65 122/82  Pulse: 92  105 106  Temp: 98.2 F (36.8 C)  99.2 F (37.3 C) 98.7 F (37.1 C)  TempSrc: Oral  Oral Oral  Resp: 16  16 16   Height:      Weight:      SpO2: 100% 98% 95% 98%    Intake/Output Summary (Last 24 hours) at 02/16/12 1520 Last data filed at 02/16/12 1222  Gross per 24 hour  Intake   1000 ml  Output    200 ml  Net    800 ml   Filed Weights   02/12/12 2115  Weight: 32.432 kg (71 lb 8 oz)    Exam:  General: Middle aged cachectic female in no acute distress, poorly groomed  HEENT: No pallor, dry oral mucosa, no icterus, no cervical or axillary lymphadenopathy.  Cardiovascular: Normal S1 and S2, no murmurs rub or gallop  Respiratory: clear auscultation bilaterally no added sounds  Abdomen: Soft, nontender, nondistended, bowel sounds present  Extremities: Thin extremities, no edema  CNS: AAO x3, appears fatigued. Has poor insight   Data Reviewed: Basic Metabolic Panel:  Lab 02/16/12 1610 02/16/12 0240 02/15/12 1852 02/15/12 1230 02/15/12 0320 02/14/12 0526  NA 120* 118* 120* 124* 124* --  K 2.9* 2.8* 3.1* 3.3* 2.9* --  CL 85* 86* 88* 91* 93* --  CO2 23 20 22 23 22  --  GLUCOSE 98 70 78 80 97 --  BUN <3* <3* <3* <3* <3* --  CREATININE 0.28* 0.26* 0.29* 0.27* 0.22* --  CALCIUM 8.6 8.2* 8.3* 8.3* 7.8* --  MG -- -- -- -- 1.4* 1.2*  PHOS -- -- -- -- 2.3 2.3   Liver Function Tests:  Lab 02/12/12 1629  AST 20  ALT 7  ALKPHOS 121*  BILITOT 0.8  PROT 8.3  ALBUMIN 3.0*   No results found for this basename: LIPASE:5,AMYLASE:5 in the last 168 hours No results found for this basename: AMMONIA:5 in the last 168 hours CBC:  Lab 02/14/12 0527 02/12/12 1629  WBC 9.0 13.7*  NEUTROABS -- 11.4*  HGB 9.5* 14.5  HCT 26.6* 39.2  MCV 73.3* 72.9*  PLT 366 316   Cardiac Enzymes: No results found for this basename: CKTOTAL:5,CKMB:5,CKMBINDEX:5,TROPONINI:5 in the last 168 hours BNP (last 3 results)  Basename 10/28/11 2052  PROBNP 133.2*    CBG: No results found for this basename: GLUCAP:5 in the last 168 hours  Recent Results (from the past 240 hour(s))  URINE CULTURE     Status: Normal   Collection Time   02/12/12  9:45 PM      Component Value Range Status Comment   Specimen Description URINE, RANDOM   Final    Special Requests NONE   Final    Culture  Setup Time 02/13/2012 02:29   Final    Colony Count NO GROWTH   Final    Culture NO GROWTH   Final    Report Status 02/14/2012 FINAL   Final   CULTURE, EXPECTORATED SPUTUM-ASSESSMENT     Status: Normal   Collection Time   02/16/12  5:15 AM      Component Value Range Status Comment   Specimen Description SPUTUM   Final    Special Requests NONE   Final    Sputum evaluation     Final  Value: MICROSCOPIC FINDINGS SUGGEST THAT THIS SPECIMEN IS NOT REPRESENTATIVE OF LOWER RESPIRATORY SECRETIONS. PLEASE RECOLLECT.     Gram Stain Report Called to,Read Back By and Verified With: H.DAVIS RN AT 0555 ON [id number] BY C.BONGEL   Report Status 02/16/2012 FINAL   Final      Studies: Dg Chest 2 View  02/16/2012  *RADIOLOGY REPORT*  Clinical Data: Cough, congestion.  CHEST - 2 VIEW  Comparison: 02/14/2012 CT.  Plain films 02/13/2012.  Findings: Dense consolidation and associated pleural thickening within the right upper lobe, unchanged.  Nodular and linear densities project over the left upper lobe also stable.  Severe emphysematous changes.  Heart is normal size.  Small right pleural effusion.  No acute bony abnormality.  IMPRESSION: No change since prior study.   Original Report Authenticated By: Charlett Nose, M.D.     Scheduled Meds:   . demeclocycline  300 mg Oral Q12H  . enoxaparin (LOVENOX) injection  30 mg Subcutaneous Q24H  . feeding supplement  237 mL Oral BID BM  . metroNIDAZOLE  500 mg Oral Q12H  . mometasone-formoterol  2 puff Inhalation BID  . potassium & sodium phosphates  1 packet Oral TID AC & HS  . potassium chloride  40 mEq Oral BID  . potassium chloride  40 mEq  Oral Q4H  . sodium chloride  3 mL Intravenous Q12H  . sodium chloride HYPERTONIC  15 mL Nebulization Once  . tiotropium  18 mcg Inhalation Daily   Continuous Infusions:   . sodium chloride 50 mL/hr at 02/16/12 1436      Time spent: 35 minutes    Vannak Montenegro  [facility] Pager [phone]. If 8PM-8AM, please contact night-coverage at [email or web address], password [facility] 02/16/2012, 3:20 PM  LOS: 4 days

## 2012-02-16 NOTE — Progress Notes (Signed)
Subjective:  Seems angry.  Not willing to participate in her care per nursing notes.  Not eating or drinking ensure.  Still no edema despite 7 liters positive Objective Vital signs in last 24 hours: Filed Vitals:   02/15/12 1300 02/15/12 2054 02/15/12 2137 02/16/12 0705  BP: 125/80  111/65 122/82  Pulse: 92  105 106  Temp: 98.2 F (36.8 C)  99.2 F (37.3 C) 98.7 F (37.1 C)  TempSrc: Oral  Oral Oral  Resp: 16  16 16   Height:      Weight:      SpO2: 100% 98% 95% 98%   Weight change:   Intake/Output Summary (Last 24 hours) at 02/16/12 1343 Last data filed at 02/16/12 1222  Gross per 24 hour  Intake 2085.42 ml  Output    200 ml  Net 1885.42 ml   Labs: Basic Metabolic Panel:  Lab 02/16/12 1610 02/16/12 0240 02/15/12 1852 02/15/12 0320 02/14/12 0526  NA 120* 118* 120* -- --  K 2.9* 2.8* 3.1* -- --  CL 85* 86* 88* -- --  CO2 23 20 22  -- --  GLUCOSE 98 70 78 -- --  BUN <3* <3* <3* -- --  CREATININE 0.28* 0.26* 0.29* -- --  CALCIUM 8.6 8.2* 8.3* -- --  ALB -- -- -- -- --  PHOS -- -- -- 2.3 2.3   Liver Function Tests:  Lab 02/12/12 1629  AST 20  ALT 7  ALKPHOS 121*  BILITOT 0.8  PROT 8.3  ALBUMIN 3.0*   No results found for this basename: LIPASE:3,AMYLASE:3 in the last 168 hours No results found for this basename: AMMONIA:3 in the last 168 hours CBC:  Lab 02/14/12 0527 02/12/12 1629  WBC 9.0 13.7*  NEUTROABS -- 11.4*  HGB 9.5* 14.5  HCT 26.6* 39.2  MCV 73.3* 72.9*  PLT 366 316   Cardiac Enzymes: No results found for this basename: CKTOTAL:5,CKMB:5,CKMBINDEX:5,TROPONINI:5 in the last 168 hours CBG: No results found for this basename: GLUCAP:5 in the last 168 hours  Iron Studies: No results found for this basename: IRON,TIBC,TRANSFERRIN,FERRITIN in the last 72 hours Studies/Results: Dg Chest 2 View  02/16/2012  *RADIOLOGY REPORT*  Clinical Data: Cough, congestion.  CHEST - 2 VIEW  Comparison: 02/14/2012 CT.  Plain films 02/13/2012.  Findings: Dense  consolidation and associated pleural thickening within the right upper lobe, unchanged.  Nodular and linear densities project over the left upper lobe also stable.  Severe emphysematous changes.  Heart is normal size.  Small right pleural effusion.  No acute bony abnormality.  IMPRESSION: No change since prior study.   Original Report Authenticated By: Charlett Nose, M.D.    Medications: Infusions:    . sodium chloride 125 mL/hr at 02/15/12 0335    Scheduled Medications:    . demeclocycline  300 mg Oral Q12H  . enoxaparin (LOVENOX) injection  30 mg Subcutaneous Q24H  . feeding supplement  237 mL Oral BID BM  . metroNIDAZOLE  500 mg Oral Q12H  . mometasone-formoterol  2 puff Inhalation BID  . potassium & sodium phosphates  1 packet Oral TID AC & HS  . potassium chloride  40 mEq Oral BID  . potassium chloride  40 mEq Oral Q4H  . sodium chloride  3 mL Intravenous Q12H  . sodium chloride HYPERTONIC  15 mL Nebulization Once  . tiotropium  18 mcg Inhalation Daily    have reviewed scheduled and prn medications.  Physical Exam: General: thin, angry Heart: RRR Lungs: mostly clear Abdomen: soft,  non tender Extremities: no peripheral edema   I Assessment/ Plan: Pt is a 57 y.o. yo female who was admitted on 02/12/2012 with  Acute on chronic hyponatremia already being treated with demeclocycline.  Has improved but marginally with current therapy   Assessment/Plan: 1. . Hyponatremia-  I do believe she has intermittent episodes of being very volume depleted and having hypovolemic hyponatremia. When she has this going on it responds appropriately to normal saline administration as has been the case this hospitalization. 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  Her TSH and cortisol are WNL and likely not contributing.  Her tank has to be close to getting full. I will decrease  her NS administration to 50 cc per hour.  I also agree with the diet encouraging caloric intake as opposed to water intake with a moderate, water restriction. Given my historical review of her sodium levels in the past, I would be surprised if her sodium got much higher than 130. 2. Volume-  Would continue normal saline administration but at a lower rate with eventually to stop 3. Dispo- this is a difficult management dilemma- she does not wish to participate in her care. continue to rule out TB to make sure that she is not a danger to others and try to establish a disposition.  I would not let her sodium level keep her here.     Lelani Garnett A   02/16/2012,1:43 PM  LOS: 4 days

## 2012-02-16 NOTE — Progress Notes (Signed)
PULMONARY  / CRITICAL CARE MEDICINE  Name: Jennifer Hurley MRN: [id number] DOB: [birth year]    LOS: 4  REFERRING PROVIDER:  Theda Belfast Dhungel  CHIEF COMPLAINT:  Weakness  BRIEF PATIENT DESCRIPTION:  57 yo female admitted on 02/12/2012 with weakness, diarrhea, anorexia with hyponatremia associated with weight loss.  She was found to have RUL pneumonia on CT chest with BTX and cavitation.  PCCM consulted 1/06 to further assess.  She was hospitalized in September 2013 for Rt upper lobe pneumonia.  Of note is that her mother had TB as a child.  Previously followed by Dr. Marchelle Gearing as outpt.  Significant PMHx of Severe COPD/emphysema, Hyponatremia, ETOH, Cocaine abuse, systolic CHF   LINES / TUBES: PIV  CULTURES: HIV 1/05 >> non reactive Sputum 1/05 >> Sputum AFB 1/05 >>  ANTIBIOTICS: Flagyl 1/04 >>  EVENTS:  1/03 Admit  TESTS: 05/17/07 CT chest >> severe centrilobular emphysema, spiculated opacities 9 mm in apices 12/06/07 CT chest >> no change 05/23/08 CT chest >> no change 10/28/11 CT chest >> RUL consolidation 11/08/11 CT chest >> decreased RUL ASD 02/14/11 CT chest >> RUL ASD with cavitation and BTX, diffuse emphysema, small Rt effusion, Lt apical scar   HISTORY OF PRESENT ILLNESS:   57 yo female admitted with weakness, anorexia, wt loss with hyponatremia and polydipsia.  She was treated for Rt upper lobe pneumonia in September 2013.  She reports her mother had TB as a child.  She no longer smokes >> can't afford cigarettes.  She denies recent use of alcohol or cocaine.  She carries a diagnosis of rheumatoid arthritis, but denies every being told she has this.      VITAL SIGNS: Temp:  [98.2 F (36.8 C)-99.2 F (37.3 C)] 98.7 F (37.1 C) (01/07 0705) Pulse Rate:  [92-106] 106  (01/07 0705) Resp:  [16] 16  (01/07 0705) BP: (111-125)/(65-82) 122/82 mmHg (01/07 0705) SpO2:  [95 %-100 %] 98 % (01/07 0705)  PHYSICAL EXAMINATION: General:  Thin, no distress Neuro:  Normal  strength, CN intact, follows commands, easily agitated, more cooperative 1-7 HEENT:  No sinus tenderness, no oral exudate, no LAN Cardiovascular:  s1s2 regular, no murmur Lungs:  Decreased breath sounds, prolonged exhalation, bronchial breath sounds RUL Abdomen:  Soft, non tender, no masses Musculoskeletal:  No edema Skin:  No rashes   Lab 02/16/12 0240 02/15/12 1852 02/15/12 1230  NA 118* 120* 124*  K 2.8* 3.1* 3.3*  CL 86* 88* 91*  CO2 20 22 23   BUN <3* <3* <3*  CREATININE 0.26* 0.29* 0.27*  GLUCOSE 70 78 80    Lab 02/14/12 0527 02/12/12 1629  HGB 9.5* 14.5  HCT 26.6* 39.2  WBC 9.0 13.7*  PLT 366 316   Dg Chest 2 View  02/16/2012  *RADIOLOGY REPORT*  Clinical Data: Cough, congestion.  CHEST - 2 VIEW  Comparison: 02/14/2012 CT.  Plain films 02/13/2012.  Findings: Dense consolidation and associated pleural thickening within the right upper lobe, unchanged.  Nodular and linear densities project over the left upper lobe also stable.  Severe emphysematous changes.  Heart is normal size.  Small right pleural effusion.  No acute bony abnormality.  IMPRESSION: No change since prior study.   Original Report Authenticated By: Charlett Nose, M.D.     ASSESSMENT / PLAN:  57 yo female with persistent vs recurrent Rt upper lobe infiltrate. Plan: F/u sputum AFB >> may need to induce sputum with RT May need bronchoscopy with airway sampling No indication that she has  acute bacterial infection >> agree with holding antibiotics Check Quantiferon gold assay  Severe COPD/Emphysema. Plan: Continue dulera with prn albuterol  Chronic hypoxic respiratory failure 2nd to COPD. Plan: Supplemental oxygen to keep SpO2 > 92%  Acute on chronic hyponatremia (has been present since 2009). Cortisol 14.6 from 11/04/11. Plan: Repeat cortisol level  11, TSH  1.88 Rer hospitalist team   [facility] Minor ACNP Adolph Pollack PCCM Pager [phone] till 3 pm If no answer page [phone] 02/16/2012, 11:28 AM

## 2012-02-16 NOTE — Progress Notes (Signed)
PT Cancellation Note  Patient Details Name: Jennifer Hurley MRN: [id number] DOB: [birth year]   Cancelled Treatment:    Reason Eval/Treat Not Completed:  (pt refused, stated 'I am cold") will check back 1/8, if refuses, will sign off.  [name] 02/16/2012, 3:19 [phone]

## 2012-02-17 DIAGNOSIS — F489 Nonpsychotic mental disorder, unspecified: Secondary | ICD-10-CM

## 2012-02-17 LAB — BASIC METABOLIC PANEL
CO2: 21 mEq/L (ref 19–32)
Calcium: 8 mg/dL — ABNORMAL LOW (ref 8.4–10.5)
Chloride: 90 mEq/L — ABNORMAL LOW (ref 96–112)
GFR calc non Af Amer: >90 mL/min (ref >90)
Glucose, Bld: 90 mg/dL (ref 70–99)
Glucose, Bld: 94 mg/dL (ref 70–99)
Potassium: 3.5 mEq/L (ref 3.5–5.1)
Sodium: 122 mEq/L — ABNORMAL LOW (ref 135–145)
Sodium: 124 mEq/L — ABNORMAL LOW (ref 135–145)

## 2012-02-17 LAB — OSMOLALITY, URINE: Osmolality, Ur: 363 mOsm/kg — ABNORMAL LOW (ref 390–1090)

## 2012-02-17 MED ORDER — GUAIFENESIN-DM 100-10 MG/5ML PO SYRP: 5.0000 mL | ORAL_SOLUTION | ORAL | Status: DC | PRN

## 2012-02-17 MED ORDER — FLUTICASONE-SALMETEROL 250-50 MCG/DOSE IN AEPB: 1.0000 | INHALATION_SPRAY | Freq: Two times a day (BID) | RESPIRATORY_TRACT | Status: DC

## 2012-02-17 MED ORDER — TIOTROPIUM BROMIDE MONOHYDRATE 18 MCG IN CAPS: 18.0000 ug | ORAL_CAPSULE | Freq: Every day | RESPIRATORY_TRACT | Status: DC

## 2012-02-17 MED ORDER — METRONIDAZOLE 500 MG PO TABS: 500.0000 mg | ORAL_TABLET | Freq: Two times a day (BID) | ORAL | Status: DC

## 2012-02-17 MED ORDER — DEMECLOCYCLINE HCL 150 MG PO TABS: 300.0000 mg | ORAL_TABLET | Freq: Two times a day (BID) | ORAL | Status: DC

## 2012-02-17 MED ORDER — ALBUTEROL SULFATE (5 MG/ML) 0.5% IN NEBU: 2.5000 mg | INHALATION_SOLUTION | Freq: Four times a day (QID) | RESPIRATORY_TRACT | Status: DC | PRN

## 2012-02-17 MED ORDER — ENSURE COMPLETE PO LIQD: 237.0000 mL | Freq: Two times a day (BID) | ORAL | Status: DC

## 2012-02-17 NOTE — Progress Notes (Signed)
Subjective:  UOP is more than has been, sign that tank is finally full.  affect  Still not the best. Sodium up to 124 today  Objective Vital signs in last 24 hours: Filed Vitals:   02/16/12 2118 02/16/12 2147 02/17/12 0603 02/17/12 0957  BP:  95/56 96/54   Pulse:  90 91   Temp:  98.6 F (37 C) 98.1 F (36.7 C)   TempSrc:  Oral Oral   Resp:  20 18   Height:      Weight:      SpO2: 95% 100% 99% 98%   Weight change:   Intake/Output Summary (Last 24 hours) at 02/17/12 1323 Last data filed at 02/17/12 1027  Gross per 24 hour  Intake   2445 ml  Output    700 ml  Net   1745 ml   Labs: Basic Metabolic Panel:  Lab 02/17/12 1610 02/17/12 0240 02/16/12 1900 02/15/12 0320 02/14/12 0526  NA 124* 122* 119* -- --  K 3.8 3.5 3.2* -- --  CL 91* 90* 87* -- --  CO2 20 21 22  -- --  GLUCOSE 94 90 102* -- --  BUN <3* <3* <3* -- --  CREATININE 0.32* 0.31* 0.31* -- --  CALCIUM 8.0* 8.0* 8.2* -- --  ALB -- -- -- -- --  PHOS -- -- -- 2.3 2.3   Liver Function Tests:  Lab 02/12/12 1629  AST 20  ALT 7  ALKPHOS 121*  BILITOT 0.8  PROT 8.3  ALBUMIN 3.0*   No results found for this basename: LIPASE:3,AMYLASE:3 in the last 168 hours No results found for this basename: AMMONIA:3 in the last 168 hours CBC:  Lab 02/14/12 0527 02/12/12 1629  WBC 9.0 13.7*  NEUTROABS -- 11.4*  HGB 9.5* 14.5  HCT 26.6* 39.2  MCV 73.3* 72.9*  PLT 366 316   Cardiac Enzymes: No results found for this basename: CKTOTAL:5,CKMB:5,CKMBINDEX:5,TROPONINI:5 in the last 168 hours CBG: No results found for this basename: GLUCAP:5 in the last 168 hours  Iron Studies: No results found for this basename: IRON,TIBC,TRANSFERRIN,FERRITIN in the last 72 hours Studies/Results: Dg Chest 2 View  02/16/2012  *RADIOLOGY REPORT*  Clinical Data: Cough, congestion.  CHEST - 2 VIEW  Comparison: 02/14/2012 CT.  Plain films 02/13/2012.  Findings: Dense consolidation and associated pleural thickening within the right upper lobe,  unchanged.  Nodular and linear densities project over the left upper lobe also stable.  Severe emphysematous changes.  Heart is normal size.  Small right pleural effusion.  No acute bony abnormality.  IMPRESSION: No change since prior study.   Original Report Authenticated By: Charlett Nose, M.D.    Medications: Infusions:    . sodium chloride 50 mL/hr at 02/16/12 1436    Scheduled Medications:    . demeclocycline  300 mg Oral Q12H  . enoxaparin (LOVENOX) injection  30 mg Subcutaneous Q24H  . feeding supplement  237 mL Oral BID BM  . metroNIDAZOLE  500 mg Oral Q12H  . mometasone-formoterol  2 puff Inhalation BID  . potassium & sodium phosphates  1 packet Oral TID AC & HS  . sodium chloride  3 mL Intravenous Q12H  . sodium chloride HYPERTONIC  15 mL Nebulization Once  . tiotropium  18 mcg Inhalation Daily    have reviewed scheduled and prn medications.  Physical Exam: General: thin, angry Heart: RRR Lungs: mostly clear Abdomen: soft, non tender Extremities: no peripheral edema   I Assessment/ Plan: Pt is a 57 y.o. yo female who  was admitted on 02/12/2012 with  Acute on chronic hyponatremia already being treated with demeclocycline.  Has improved but marginally with current therapy   Assessment/Plan: 1. . Hyponatremia-   intermittent episodes of being very volume depleted and having hypovolemic hyponatremia. When she has this going on it responds appropriately to normal saline administration as has been the case this hospitalization. 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  Her TSH and cortisol are WNL and likely not contributing.  Her tank seems now full. I will stop her NS administration. I also agree with the diet encouraging caloric intake as opposed to water intake with a moderate, water restriction. Given my historical review of her sodium levels in the  past, I would be surprised if her sodium got much higher than 130. 2. Volume-  Stop saline and see if can maintain 3. Dispo- this is a difficult management dilemma- she does not wish to participate in her care. continue to rule out TB to make sure that she is not a danger to others and try to establish a disposition.  I would not let her sodium level keep her here.    I have no further recs.  Renal will sign off, call with questions.    Seward Coran A   02/17/2012,1:23 PM  LOS: 5 days

## 2012-02-17 NOTE — Progress Notes (Signed)
Patient stated that she would follow up with Dr Mikeal Hawthorne after discharge. Abelino Derrick RN,BSN,MHA

## 2012-02-17 NOTE — Progress Notes (Signed)
Patient discharged per MD order.  Patient given COPD gold packet and educated regarding benefits of COPD gold card.  Patient remained indifferent to teaching but did listen.  Patient given discharge instructions with friend at bedside.  Patient verbally demonstrated understanding of discharge instructions.

## 2012-02-17 NOTE — Consult Note (Signed)
Patient Identification:  Jennifer Hurley Date of Evaluation:  02/17/2012 Reason for Consult: Hyponatremia, refusing treatment, Pneumonia  Referring Provider: Dr. Gonzella Lex   History of Present Illness:pt is ac\dmitted with complaints of weakness.  She is found to have a low sodium 118 mEq/L.  She says she drinks 6- 20 oz. Bottles of water/day.  She has also had diarrhea for 1 week.    Past Psychiatric History:Pt smokes 3 cigarettes/day and has not tried to quit.    She has a past history smoking cannabis and using coke.  She denies ever feeling suicidal or trying to take her own life. She has some sort of disability income.   Past Medical History:     Past Medical History  Diagnosis Date  . Rheumatoid arthritis   . Bronchitis   . Myocardial infarction   . Pneumonia        Past Surgical History  Procedure Date  . Tonsillectomy   . Tubal ligation     Allergies: No Known Allergies  Current Medications:  Prior to Admission medications   Medication Sig Start Date End Date Taking? Authorizing Provider  albuterol (PROVENTIL) (5 MG/ML) 0.5% nebulizer solution Take 0.5 mLs (2.5 mg total) by nebulization every 6 (six) hours as needed for wheezing or shortness of breath. 11/02/11  Yes Alison Murray, MD  demeclocycline (DECLOMYCIN) 150 MG tablet Take 2 tablets (300 mg total) by mouth every 12 (twelve) hours. may cause photosensitivity; discontinue if skin erythema occurs. Use skin protection and avoid prolonged exposure to sunlight; do not use tanning equipment. 11/13/11  Yes Standley Brooking, MD  feeding supplement (ENSURE COMPLETE) LIQD Take 237 mLs by mouth 2 (two) times daily between meals. 11/13/11  Yes Standley Brooking, MD  Fluticasone-Salmeterol (ADVAIR DISKUS) 250-50 MCG/DOSE AEPB Inhale 1 puff into the lungs 2 (two) times daily. 11/02/11  Yes Alison Murray, MD  tiotropium (SPIRIVA) 18 MCG inhalation capsule Place 1 capsule (18 mcg total) into inhaler and inhale daily. 11/02/11  Yes Alison Murray, MD    Social History:    reports that she has been smoking.  She has never used smokeless tobacco. She reports that she drinks alcohol. She reports that she uses illicit drugs (Cocaine and Marijuana).   Family History:    Family History  Problem Relation Age of Onset  . Coronary artery disease    . Diabetes type II      Mental Status Examination/Evaluation: Objective:  Appearance: Neat  Eye Contact::  Good  Speech:  Clear and Coherent  Volume:  Normal  Mood:  resting  Affect:  Blunt and Congruent  Thought Process:  Coherent and Logical  Orientation:  Other:  pt is oriented to person, place; not to illness or medications  Thought Content:  denies psychiatric symptoms  Suicidal Thoughts:  No  Homicidal Thoughts:  No  Judgement:  Fair  Insight:  Lacking   DIAGNOSIS:   AXIS I  Mental status changes due to metabolic shifts  AXIS II  Deferred  AXIS III See medical notes.  AXIS IV other psychosocial or environmental problems, problems related to social environment and pt may be drinking water excessively that may contribute to her hyponatremia; possible poor iintake.  Poor nutrition alone may cause mental status changes,  long term antibioticx may have contributed to fluid depletion also causing jhyponatremic and mental status changes.   AXIS V 41-50 serious symptoms   Assessment/Plan:  Trying to contact Dr. Gonzella Lex,  Pt is awake and aware. She cooperates with evaluation.  She says she graduated from [employer].  She names the date correctly.  She is given 3 words for short term memory. She recalls 2 of 3.  She calculates with concentration serial 3s [20 - 3 etc] correctly.  She spells world but cannot spell it backwards.  She interprets proverb 'spilled milk' concretely.  She is given a situation and she responds with an appropriate solution.      She does not know her diagnoses or medications.  She is unaware of her pnsumonia or problems with her sodium.  She sleeps  well and denies any thoughts of suicide.  She claims she eats but 3 containers of protein nutritional supplement are on her tray [? Whether these were brought at different times or not - may indicate neglect/anorexia.  Some medications she had taken on last admission may have affected her appetite.   Need to rule out diarrhea a new Dx or secondary to antibiotic(s) given. RECOMMENDATION:  1.  Pt demonstrates she has capacity but is not versed in her medical conditions or treatment.  This 'capacity' does not endorse her ability to manage her medical care in a responsible manner.    2.  Collateral information is needed - would request Psych CSW if contact with her son or daughter can be made to better assess her ability for self care, including her management of medications.  3.  Pt may need ALF/SNF depending on needs. 4.  Will follow pt.  Mickeal Skinner MD 02/17/2012 1:07 PM

## 2012-02-17 NOTE — Evaluation (Signed)
Physical Therapy Evaluation Patient Details Name: Jennifer Hurley MRN: [id number] DOB: [birth year] Today's Date: 02/17/2012 Time: 5784-6962 PT Time Calculation (min): 13 min  PT Assessment / Plan / Recommendation Clinical Impression  Pt presents with hyponatremia and PNA with history of RA.  Pt also being screened for TB and is therefore on airborne isolation.  Noted that pt was very agitated during session and only agreed to ambulate back and fourth once in room.  Unable to obtain full PLOF/history from pt due to agitation.  Pt will benefit from skilled PT in acute venue if she can agree to participate.  PT recommends HHPT, however assume that pt will refuse.      PT Assessment  Patient needs continued PT services    Follow Up Recommendations  Home health PT;No PT follow up    Does the patient have the potential to tolerate intense rehabilitation      Barriers to Discharge Decreased caregiver support      Equipment Recommendations   (TBD)    Recommendations for Other Services     Frequency Min 3X/week    Precautions / Restrictions Precautions Precautions: Fall Restrictions Weight Bearing Restrictions: No   Pertinent Vitals/Pain No pain      Mobility  Bed Mobility Bed Mobility: Supine to Sit;Sit to Supine Supine to Sit: 6: Modified independent (Device/Increase time) Sit to Supine: 6: Modified independent (Device/Increase time) Details for Bed Mobility Assistance: increased time Transfers Transfers: Sit to Stand;Stand to Sit Sit to Stand: 4: Min guard;From bed Stand to Sit: 4: Min guard;To bed Details for Transfer Assistance: Min/guard for safety/balance with cues for hand placement.  Ambulation/Gait Ambulation/Gait Assistance: 4: Min guard Ambulation Distance (Feet): 28 Feet Assistive device: None Ambulation/Gait Assistance Details: Min/guard for safety with cues for safety.  Noted pt to be somewhat unsteady, however no LOB.  Pt agreed to walk back and fourth in room  once only.   Gait Pattern: Step-through pattern;Decreased stride length;Trunk flexed;Narrow base of support Gait velocity: decreased Stairs: No Wheelchair Mobility Wheelchair Mobility: No    Shoulder Instructions     Exercises     PT Diagnosis: Difficulty walking;Generalized weakness  PT Problem List: Decreased strength;Decreased activity tolerance;Decreased balance;Decreased mobility;Decreased safety awareness PT Treatment Interventions: Gait training;Functional mobility training;Therapeutic activities;Therapeutic exercise;Balance training;Patient/family education;DME instruction   PT Goals Acute Rehab PT Goals PT Goal Formulation: With patient Time For Goal Achievement: 02/24/12 Potential to Achieve Goals: Fair Pt will go Supine/Side to Sit: Independently PT Goal: Supine/Side to Sit - Progress: Goal set today Pt will go Sit to Supine/Side: Independently PT Goal: Sit to Supine/Side - Progress: Goal set today Pt will go Sit to Stand: Independently PT Goal: Sit to Stand - Progress: Goal set today Pt will go Stand to Sit: Independently PT Goal: Stand to Sit - Progress: Goal set today Pt will Ambulate: 51 - 150 feet;with supervision;with least restrictive assistive device PT Goal: Ambulate - Progress: Goal set today  Visit Information  Last PT Received On: 02/17/12 Assistance Needed: +1    Subjective Data  Subjective: I'm not walking in the hall if I have to wear a mask.  Patient Stated Goal: n/a   Prior Functioning  Home Living Lives With: Alone Home Adaptive Equipment: None Additional Comments: Unable to get full history as pt was very agitated during session and MD arrived to speak with pt.   Prior Function Comments: Unable to obtain PLOF due to pt very agitated during session and MD arrived to speak with pt.  Cognition  Overall Cognitive Status: Appears within functional limits for tasks assessed/performed Arousal/Alertness: Awake/alert Orientation Level: Appears  intact for tasks assessed Behavior During Session: Agitated    Extremity/Trunk Assessment Right Lower Extremity Assessment RLE ROM/Strength/Tone: [facility] for tasks assessed Left Lower Extremity Assessment LLE ROM/Strength/Tone: [facility] for tasks assessed   Balance Balance Balance Assessed: Yes Static Sitting Balance Static Sitting - Balance Support: Feet supported;No upper extremity supported Static Sitting - Level of Assistance: 7: Independent  End of Session PT - End of Session Activity Tolerance: Patient limited by fatigue Patient left: in bed;with call bell/phone within reach;with bed alarm set  GP     Vista Deck 02/17/2012, 12:28 PM

## 2012-02-17 NOTE — Progress Notes (Signed)
Patient is also agreeable to have [facility] for [facility] services, she has used them in the past. Norberta Keens RN with [facility] called for arrangements.

## 2012-02-17 NOTE — Discharge Summary (Addendum)
Physician Discharge Summary  Jennifer Hurley YNW:[id number] DOB: Jan 30, 1956 DOA: 02/12/2012  PCP: Lonia Blood, MD  Admit date: 02/12/2012 Discharge date: 02/17/2012  Recommendations for Outpatient Follow-up:  1. AFB x1 is negative 2. Please note that another AFB was not collected as patient could not produce another sputum sample. In addition quantiferon test is pending. This will be followed up and patient will be called back in case this is positive. 3. Skin test negative 4. Patient was already set up with Dr. Lonia Blood but she is very noncompliant with appointments.  Discharge Diagnoses:  Principal Problem:  *Hyponatremia Active Problems:  COPD  Leukocytosis  Hypokalemia  Chloride, decreased level  Malnutrition  Trichomoniasis  Persistent pneumonia  Chronic respiratory failure with hypoxia   Discharge Condition: Patient insists on going home today.   Diet recommendation: As tolerated  History of present illness:  57 year old malnourished female with history of COPD admitted for weakness and failure to thrive with malnutrition and hyponatremia. 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 Patient however has been drinking large-volume water and is placed on fluid restriction. She has been positive by almost 7 L since admission however her sodium level is being slowly corrected  Sodium trending up, 124 today. Patient can continue taking demeclocycline twice a day for next 8 days upon discharge  SH and cortisol within normal limits.   Please note that the magnesium and potassium have been repleted Weight loss with cavitary lung lesion   Chest x-ray  done on admission shows persistent right upper lobe pneumonia and infected bullae.   CT chest done to exclude malignancy shows rt UL dense consolidation compatible with PNA with element of cystic bronchiectasis vs cavitation.   This was discussed with PCCM, the new CT findings does suggest cavitary lesion which needs to be ruled out forTB.   Please note that only one AFB was collected, second sample was not collected because patient does not have any sputum to produce. AFB x1 is negative. Quantiferon test is pending. PPD skin test is negative. Hypokalemia   Potassium and magnesium have been repleted Severe malnutrition with failure to thrive.   Nutrition consulted and recommendation for feeding supplement. Prescription provided for feeding supplement  COPD   With emphysema, bullous emphysema  Patient continues to smoke Trichomoniasis   Patient has received 5 days of Flagyl and will continue Flagyl for 2 more days on discharge, prescription provided  Anemia   noted on previous hospitalization as well. Iron panel done during that time Suggested iron deficiency anemia. Will hold off on treatment at this time.  Issues with compliance to treatment   Patient has capacity for her medical treatment, this was determined by psychiatry. Patient reports she is compliant with medications. On occasion she runs out of medications but usually takes all her medications.   Code Status: Full code  Family Communication: discussed with Son Romelle Muldoon over the phone on 1/7  Disposition Plan: Home with home health physical therapy and occupational therapy   Consultants:   PCCM   renal   Psychiatry  Procedures:  None  Antibiotics:  Flagyl will be completed for 2 more days on discharge. Patient has received 5 days of Flagyl so far.  Manson Passey, MD  [facility] 02/17/2012, 2:00 PM  Pager #: [phone]   Discharge Exam: Filed Vitals:   02/17/12 0603  BP: 96/54  Pulse: 91  Temp: 98.1 F  (36.7 C)  Resp: 18   Filed Vitals:   02/16/12 2118 02/16/12 2147 02/17/12 0603 02/17/12 0957  BP:  95/56 96/54   Pulse:  90 91   Temp:  98.6 F (37 C) 98.1 F (36.7 C)   TempSrc:  Oral Oral   Resp:  20 18   Height:      Weight:      SpO2: 95% 100% 99% 98%    General: Pt is alert, follows commands appropriately, not in acute distress; very cachectic Cardiovascular: Regular rate and rhythm, S1/S2 +, no murmurs, no rubs, no gallops Respiratory: Coarse breath sounds, diminished Abdominal: Soft, non tender, non distended, bowel sounds +, no guarding Extremities: no edema, no cyanosis, pulses palpable bilaterally DP and PT Neuro: Grossly nonfocal  Discharge Instructions  Discharge Orders    Future Orders Please Complete By Expires   Diet - low sodium heart healthy      Increase activity slowly      Call MD for:  persistant nausea and vomiting      Call MD for:  severe uncontrolled pain      Call MD for:  difficulty breathing, headache or visual disturbances      Call MD for:  persistant dizziness or light-headedness          Medication List     As of 02/17/2012  2:00 PM    TAKE these medications         albuterol (5 MG/ML) 0.5% nebulizer solution   Commonly known as: PROVENTIL   Take 0.5 mLs (2.5 mg total) by nebulization every 6 (six) hours as needed for wheezing or shortness of breath.      demeclocycline 150 MG tablet   Commonly known as: DECLOMYCIN   Take 2 tablets (300 mg total) by mouth every 12 (twelve) hours. may cause photosensitivity; discontinue if skin erythema occurs. Use skin protection and avoid prolonged exposure to sunlight; do not use tanning equipment.      feeding supplement Liqd   Take 237 mLs by mouth 2 (two) times daily between meals.      Fluticasone-Salmeterol 250-50 MCG/DOSE Aepb   Commonly known as: ADVAIR   Inhale 1 puff into the lungs 2 (two) times daily.      guaiFENesin-dextromethorphan 100-10 MG/5ML syrup   Commonly known as:  ROBITUSSIN DM   Take 5 mLs by mouth every 4 (four) hours as needed for cough.      metroNIDAZOLE 500 MG tablet   Commonly known as: FLAGYL   Take 1 tablet (500 mg total) by mouth every 12 (twelve) hours.      tiotropium 18 MCG inhalation capsule   Commonly known as: SPIRIVA   Place 1 capsule (18 mcg total) into inhaler and inhale daily.           Follow-up Information    Follow up with [facility], MD. Schedule an appointment as soon as possible for a visit in 2 weeks. (If symptoms worsen)    Contact information:   [address] [place] [address] [phone]           The results of significant diagnostics from this hospitalization (including imaging, microbiology, ancillary and laboratory) are listed below for reference.    Significant Diagnostic Studies: Dg Chest 2 View 02/16/2012  * IMPRESSION:  No change since prior study.     Dg Chest 2 View 02/13/2012  *  IMPRESSION: Since the prior examinations from late September, 2013, persistent right upper lobe pneumonia with numerous infected bullae.  New loculated pleural effusion laterally at the right apex.  New patchy pneumonia in the superior segment right lower lobe.     Ct Chest W Contrast 02/14/2012  * IMPRESSION: New right upper lobe dense consolidation compatible with pneumonia. This could be an element of cystic bronchiectasis versus cavitation.  Trace right parapneumonic effusion.  Severe emphysematous changes.     Microbiology: Recent Results (from the past 240 hour(s))  URINE CULTURE     Status: Normal   Collection Time   02/12/12  9:45 PM      Component Value Range Status Comment   Specimen Description URINE, RANDOM   Final    Special Requests NONE   Final    Culture  Setup Time 02/13/2012 02:29   Final    Colony Count NO GROWTH   Final    Culture NO GROWTH   Final    Report Status 02/14/2012 FINAL   Final   CULTURE, EXPECTORATED SPUTUM-ASSESSMENT     Status: Normal   Collection Time   02/16/12  5:15 AM       Component Value Range Status Comment   Specimen Description SPUTUM   Final    Special Requests NONE   Final    Sputum evaluation     Final    Value: MICROSCOPIC FINDINGS SUGGEST THAT THIS SPECIMEN IS NOT REPRESENTATIVE OF LOWER RESPIRATORY SECRETIONS. PLEASE RECOLLECT.     Gram Stain Report Called to,Read Back By and Verified With: H.DAVIS RN AT 0555 ON [id number] BY C.BONGEL   Report Status 02/16/2012 FINAL   Final   AFB CULTURE WITH SMEAR     Status: Normal (Preliminary result)   Collection Time   02/16/12  5:15 AM      Component Value Range Status Comment   Specimen Description SPUTUM   Final    Special Requests Normal   Final    ACID FAST SMEAR NO ACID FAST BACILLI SEEN   Final    Culture     Final    Value: CULTURE WILL BE EXAMINED FOR 6 WEEKS BEFORE ISSUING A FINAL REPORT   Report Status PENDING   Incomplete   CULTURE, EXPECTORATED SPUTUM-ASSESSMENT     Status: Normal   Collection Time   02/16/12  1:21 PM      Component Value Range Status Comment   Specimen Description SPUTUM   Final    Special Requests Normal   Final    Sputum evaluation     Final    Value: THIS SPECIMEN IS ACCEPTABLE. RESPIRATORY CULTURE REPORT TO FOLLOW.   Report Status 02/16/2012 FINAL   Final   CULTURE, RESPIRATORY     Status: Normal (Preliminary result)   Collection Time   02/16/12  1:21 PM      Component Value Range Status Comment   Specimen Description SPUTUM   Final    Special Requests NONE   Final    Gram Stain     Final    Value: RARE WBC PRESENT,BOTH PMN AND MONONUCLEAR     RARE SQUAMOUS EPITHELIAL CELLS PRESENT     FEW YEAST     FEW GRAM POSITIVE COCCI     IN PAIRS   Culture PENDING   Incomplete    Report Status PENDING   Incomplete  Labs: Basic Metabolic Panel:  Lab 02/17/12 4098 02/17/12 0240 02/16/12 1900 02/16/12 1110 02/16/12 0240  NA 124* 122* 119* 120* 118*  K 3.8 3.5 3.2* 2.9* 2.8*  CL 91* 90* 87* 85* 86*  CO2 20 21 22 23 20   GLUCOSE 94 90 102* 98 70  BUN <3* <3* <3* <3* <3*    CREATININE 0.32* 0.31* 0.31* 0.28* 0.26*  CALCIUM 8.0* 8.0* 8.2* 8.6 8.2*   Liver Function Tests:  Lab 02/12/12 1629  AST 20  ALT 7  ALKPHOS 121*  BILITOT 0.8  PROT 8.3  ALBUMIN 3.0*   CBC:  Lab 02/14/12 0527 02/12/12 1629  WBC 9.0 13.7*  NEUTROABS -- 11.4*  HGB 9.5* 14.5  HCT 26.6* 39.2  MCV 73.3* 72.9*  PLT 366 316   BNP: BNP (last 3 results)  Basename 10/28/11 2052  PROBNP 133.2*   CBG: No results found for this basename: GLUCAP:5 in the last 168 hours  Time coordinating discharge: Over 30 minutes  Signed:

## 2012-02-17 NOTE — Progress Notes (Signed)
PT KNOWN TO CM- WAS GIVEN INFORMATION ON FINDING A PCP 10/2011, PATIENT WAS TO FOLLOW UP WITH DR Mikeal Hawthorne ON 11/16/2011; PATIENT WAS INFORMED ABOUT THE [facility] ALSO; PT WAS ACTIVE WITH [facility] UNTIL 11/2012

## 2012-02-17 NOTE — Progress Notes (Signed)
Chart review.  Noted that, per MD note on 02/16/12, psychiatry consulted for capacity.  No psych CSW needs identified, at this time.  Psych MD to evaluate Pt for capacity.  Providence Crosby, LCSWA Clinical Social Work [phone]

## 2012-02-17 NOTE — Progress Notes (Signed)
PULMONARY  / CRITICAL CARE MEDICINE  Name: Jennifer Hurley MRN: [id number] DOB: Aug 08, 1955    LOS: 5  REFERRING PROVIDER:  Theda Belfast Dhungel  CHIEF COMPLAINT:  Weakness  BRIEF PATIENT DESCRIPTION:  57 yo female admitted on 02/12/2012 with weakness, diarrhea, anorexia with hyponatremia associated with weight loss.  She was found to have RUL pneumonia on CT chest with BTX and cavitation.  PCCM consulted 1/06 to further assess.  She was hospitalized in September 2013 for Rt upper lobe pneumonia.  Of note is that her mother had TB as a child.  Previously followed by Dr. Marchelle Gearing as outpt.  Significant PMHx of Severe COPD/emphysema, Hyponatremia, ETOH, Cocaine abuse, systolic CHF   LINES / TUBES: PIV  CULTURES: HIV 1/05 >> non reactive Sputum 1/05 >> Sputum AFB 1/05 >> neg afb smear >>  ANTIBIOTICS: Flagyl 1/04 >>  EVENTS:  1/03 Admit  TESTS: 05/17/07 CT chest >> severe centrilobular emphysema, spiculated opacities 9 mm in apices 12/06/07 CT chest >> no change 05/23/08 CT chest >> no change 10/28/11 CT chest >> RUL consolidation 11/08/11 CT chest >> decreased RUL ASD 02/14/11 CT chest >> RUL ASD with cavitation and BTX, diffuse emphysema, small Rt effusion, Lt apical scar   HISTORY OF PRESENT ILLNESS:   57 yo female admitted with weakness, anorexia, wt loss with hyponatremia and polydipsia.  She was treated for Rt upper lobe pneumonia in September 2013.  She reports her mother had TB as a child.  She no longer smokes >> can't afford cigarettes.  She denies recent use of alcohol or cocaine.  She carries a diagnosis of rheumatoid arthritis, but denies every being told she has this.      VITAL SIGNS: Temp:  [98.1 F (36.7 C)-98.7 F (37.1 C)] 98.1 F (36.7 C) (01/08 0603) Pulse Rate:  [90-121] 91  (01/08 0603) Resp:  [18-20] 18  (01/08 0603) BP: (85-96)/(54-65) 96/54 mmHg (01/08 0603) SpO2:  [95 %-100 %] 98 % (01/08 0957) FIO2  RA  PHYSICAL EXAMINATION: General:  Thin, no  distress Neuro:  Normal strength, CN intact, follows commands, easily agitated, more cooperative 1-7 HEENT:  No sinus tenderness, no oral exudate, no LAN Cardiovascular:  s1s2 regular, no murmur Lungs:  Decreased breath sounds, prolonged exhalation, bronchial breath sounds RUL Abdomen:  Soft, non tender, no masses Musculoskeletal:  No edema Skin:  No rashes   Lab 02/17/12 0240 02/16/12 1900 02/16/12 1110  NA 122* 119* 120*  K 3.5 3.2* 2.9*  CL 90* 87* 85*  CO2 21 22 23   BUN <3* <3* <3*  CREATININE 0.31* 0.31* 0.28*  GLUCOSE 90 102* 98    Lab 02/14/12 0527 02/12/12 1629  HGB 9.5* 14.5  HCT 26.6* 39.2  WBC 9.0 13.7*  PLT 366 316   Dg Chest 2 View  02/16/2012  *RADIOLOGY REPORT*  Clinical Data: Cough, congestion.  CHEST - 2 VIEW  Comparison: 02/14/2012 CT.  Plain films 02/13/2012.  Findings: Dense consolidation and associated pleural thickening within the right upper lobe, unchanged.  Nodular and linear densities project over the left upper lobe also stable.  Severe emphysematous changes.  Heart is normal size.  Small right pleural effusion.  No acute bony abnormality.  IMPRESSION: No change since prior study.   Original Report Authenticated By: Charlett Nose, M.D.     ASSESSMENT / PLAN:  57 yo female with persistent vs recurrent Rt upper lobe infiltrate. Plan: F/u sputum AFB >> may need to induce sputum with RT, she has sputum cultures  pending>> No need bronchoscopy with airway sampling at this time No indication that she has acute bacterial infection >> agree with holding antibiotics Check Quantiferon gold assay  Severe COPD/Emphysema. Plan: Continue dulera with prn albuterol  Chronic hypoxic respiratory failure 2nd to COPD. Plan: Supplemental oxygen to keep SpO2 > 92%  Acute on chronic hyponatremia (has been present since 2009). Cortisol 14.6 from 11/04/11. Plan: Repeat cortisol level  11, TSH  1.88 Per hospitalist/Renal team  Global: PCCM will sign off    Brett Canales  Min[place] ACNP [facility] Pollack PCCM Pager [phone] till 3 pm If no answer page [phone] 02/17/2012, 10:31 AM  Pt seen and indepently examined and chart reviewed > agree with above imp and plan.  Sandrea Hughs, MD Pulmonary and Critical Care Medicine [facility] Cell [phone] After 5:30 PM or weekends, call [phone]

## 2012-02-18 LAB — CHLORIDE, URINE, RANDOM: Chloride Urine: 162 mEq/L

## 2012-02-19 LAB — CULTURE, RESPIRATORY W GRAM STAIN

## 2012-04-12 ENCOUNTER — Inpatient Hospital Stay (HOSPITAL_COMMUNITY): Payer: Medicare Other

## 2012-04-12 ENCOUNTER — Inpatient Hospital Stay (HOSPITAL_COMMUNITY)
Admission: EM | Admit: 2012-04-12 | Discharge: 2012-04-25 | DRG: 004 | Disposition: A | Discharge status: Other Acute Inpatient Hospital | Payer: Medicare Other | Attending: Internal Medicine | Admitting: Internal Medicine

## 2012-04-12 ENCOUNTER — Encounter (HOSPITAL_COMMUNITY): Payer: Self-pay | Admitting: Emergency Medicine

## 2012-04-12 ENCOUNTER — Emergency Department (HOSPITAL_COMMUNITY): Payer: Medicare Other

## 2012-04-12 DIAGNOSIS — N179 Acute kidney failure, unspecified: Secondary | ICD-10-CM | POA: Diagnosis present

## 2012-04-12 DIAGNOSIS — F17200 Nicotine dependence, unspecified, uncomplicated: Secondary | ICD-10-CM | POA: Diagnosis present

## 2012-04-12 DIAGNOSIS — D72819 Decreased white blood cell count, unspecified: Secondary | ICD-10-CM | POA: Diagnosis present

## 2012-04-12 DIAGNOSIS — I471 Supraventricular tachycardia, unspecified: Secondary | ICD-10-CM

## 2012-04-12 DIAGNOSIS — F1411 Cocaine abuse, in remission: Secondary | ICD-10-CM | POA: Diagnosis present

## 2012-04-12 DIAGNOSIS — J441 Chronic obstructive pulmonary disease with (acute) exacerbation: Secondary | ICD-10-CM

## 2012-04-12 DIAGNOSIS — D649 Anemia, unspecified: Secondary | ICD-10-CM | POA: Diagnosis present

## 2012-04-12 DIAGNOSIS — B370 Candidal stomatitis: Secondary | ICD-10-CM

## 2012-04-12 DIAGNOSIS — R7402 Elevation of levels of lactic acid dehydrogenase (LDH): Secondary | ICD-10-CM | POA: Diagnosis present

## 2012-04-12 DIAGNOSIS — E46 Unspecified protein-calorie malnutrition: Secondary | ICD-10-CM

## 2012-04-12 DIAGNOSIS — E43 Unspecified severe protein-calorie malnutrition: Secondary | ICD-10-CM

## 2012-04-12 DIAGNOSIS — J9620 Acute and chronic respiratory failure, unspecified whether with hypoxia or hypercapnia: Principal | ICD-10-CM | POA: Diagnosis present

## 2012-04-12 DIAGNOSIS — F1010 Alcohol abuse, uncomplicated: Secondary | ICD-10-CM

## 2012-04-12 DIAGNOSIS — G9340 Encephalopathy, unspecified: Secondary | ICD-10-CM | POA: Diagnosis present

## 2012-04-12 DIAGNOSIS — T68XXXA Hypothermia, initial encounter: Secondary | ICD-10-CM | POA: Diagnosis present

## 2012-04-12 DIAGNOSIS — R627 Adult failure to thrive: Secondary | ICD-10-CM | POA: Diagnosis present

## 2012-04-12 DIAGNOSIS — J9611 Chronic respiratory failure with hypoxia: Secondary | ICD-10-CM

## 2012-04-12 DIAGNOSIS — J189 Pneumonia, unspecified organism: Secondary | ICD-10-CM

## 2012-04-12 DIAGNOSIS — E871 Hypo-osmolality and hyponatremia: Secondary | ICD-10-CM

## 2012-04-12 DIAGNOSIS — J9601 Acute respiratory failure with hypoxia: Secondary | ICD-10-CM

## 2012-04-12 DIAGNOSIS — I10 Essential (primary) hypertension: Secondary | ICD-10-CM

## 2012-04-12 DIAGNOSIS — R410 Disorientation, unspecified: Secondary | ICD-10-CM

## 2012-04-12 DIAGNOSIS — Z9981 Dependence on supplemental oxygen: Secondary | ICD-10-CM

## 2012-04-12 DIAGNOSIS — R451 Restlessness and agitation: Secondary | ICD-10-CM

## 2012-04-12 DIAGNOSIS — E878 Other disorders of electrolyte and fluid balance, not elsewhere classified: Secondary | ICD-10-CM

## 2012-04-12 DIAGNOSIS — J4489 Other specified chronic obstructive pulmonary disease: Secondary | ICD-10-CM

## 2012-04-12 DIAGNOSIS — D72829 Elevated white blood cell count, unspecified: Secondary | ICD-10-CM

## 2012-04-12 DIAGNOSIS — E876 Hypokalemia: Secondary | ICD-10-CM

## 2012-04-12 DIAGNOSIS — J9600 Acute respiratory failure, unspecified whether with hypoxia or hypercapnia: Secondary | ICD-10-CM

## 2012-04-12 DIAGNOSIS — Z720 Tobacco use: Secondary | ICD-10-CM

## 2012-04-12 DIAGNOSIS — R7401 Elevation of levels of liver transaminase levels: Secondary | ICD-10-CM | POA: Diagnosis present

## 2012-04-12 DIAGNOSIS — Z681 Body mass index (BMI) 19 or less, adult: Secondary | ICD-10-CM

## 2012-04-12 DIAGNOSIS — F1910 Other psychoactive substance abuse, uncomplicated: Secondary | ICD-10-CM

## 2012-04-12 DIAGNOSIS — A599 Trichomoniasis, unspecified: Secondary | ICD-10-CM

## 2012-04-12 DIAGNOSIS — F1410 Cocaine abuse, uncomplicated: Secondary | ICD-10-CM

## 2012-04-12 DIAGNOSIS — R0902 Hypoxemia: Secondary | ICD-10-CM | POA: Diagnosis present

## 2012-04-12 DIAGNOSIS — I2510 Atherosclerotic heart disease of native coronary artery without angina pectoris: Secondary | ICD-10-CM | POA: Diagnosis present

## 2012-04-12 DIAGNOSIS — I5022 Chronic systolic (congestive) heart failure: Secondary | ICD-10-CM

## 2012-04-12 DIAGNOSIS — I509 Heart failure, unspecified: Secondary | ICD-10-CM | POA: Diagnosis present

## 2012-04-12 DIAGNOSIS — K148 Other diseases of tongue: Secondary | ICD-10-CM | POA: Diagnosis not present

## 2012-04-12 HISTORY — DX: Hypo-osmolality and hyponatremia: E87.1

## 2012-04-12 HISTORY — DX: Cocaine abuse, uncomplicated: F14.10

## 2012-04-12 HISTORY — DX: Chronic respiratory failure, unspecified whether with hypoxia or hypercapnia: J96.10

## 2012-04-12 HISTORY — DX: Tobacco use: Z72.0

## 2012-04-12 HISTORY — DX: Alcohol abuse, uncomplicated: F10.10

## 2012-04-12 HISTORY — DX: Chronic obstructive pulmonary disease, unspecified: J44.9

## 2012-04-12 LAB — BLOOD GAS, ARTERIAL
Acid-base deficit: 8.4 mmol/L — ABNORMAL HIGH (ref 0.0–2.0)
Drawn by: 10370
Drawn by: 232811
MECHVT: 400 mL
O2 Content: 3 L/min
O2 Saturation: 96.4 %
Patient temperature: 36.6
Patient temperature: 36.6
Patient temperature: 36.1
Pressure control: 5 cmH2O
RATE: 15 resp/min
RATE: 16 resp/min
TCO2: 21.2 mmol/L (ref 0–100)
TCO2: 16.3 mmol/L (ref 0–100)
pCO2 arterial: 42.7 mmHg (ref 35.0–45.0)
pH, Arterial: 7.189 — CL (ref 7.350–7.450)
pH, Arterial: 7.214 — ABNORMAL LOW (ref 7.350–7.450)

## 2012-04-12 LAB — COMPREHENSIVE METABOLIC PANEL
Albumin: 3.3 g/dL — ABNORMAL LOW (ref 3.5–5.2)
Alkaline Phosphatase: 97 U/L (ref 39–117)
BUN: 32 mg/dL — ABNORMAL HIGH (ref 6–23)
Calcium: 8.8 mg/dL (ref 8.4–10.5)
Creatinine, Ser: 1.41 mg/dL — ABNORMAL HIGH (ref 0.50–1.10)
GFR calc Af Amer: 48 mL/min — ABNORMAL LOW (ref >90)
Glucose, Bld: 102 mg/dL — ABNORMAL HIGH (ref 70–99)
Potassium: 4.1 mEq/L (ref 3.5–5.1)
Total Protein: 8.1 g/dL (ref 6.0–8.3)

## 2012-04-12 LAB — GLUCOSE, CAPILLARY
Glucose-Capillary: 213 mg/dL — ABNORMAL HIGH (ref 70–99)
Glucose-Capillary: 67 mg/dL — ABNORMAL LOW (ref 70–99)

## 2012-04-12 LAB — RAPID URINE DRUG SCREEN, HOSP PERFORMED
Amphetamines: NOT DETECTED
Cocaine: NOT DETECTED
Opiates: NOT DETECTED
Tetrahydrocannabinol: NOT DETECTED

## 2012-04-12 LAB — URINE MICROSCOPIC-ADD ON

## 2012-04-12 LAB — URINALYSIS, ROUTINE W REFLEX MICROSCOPIC
Glucose, UA: NEGATIVE mg/dL
Ketones, ur: NEGATIVE mg/dL
Leukocytes, UA: NEGATIVE
pH: 5 (ref 5.0–8.0)

## 2012-04-12 LAB — CBC WITH DIFFERENTIAL/PLATELET
Basophils Relative: 0 % (ref 0–1)
Eosinophils Absolute: 0 10*3/uL (ref 0.0–0.7)
Eosinophils Relative: 0 % (ref 0–5)
HCT: 36.4 % (ref 36.0–46.0)
Hemoglobin: 13 g/dL (ref 12.0–15.0)
Lymphs Abs: 1.2 10*3/uL (ref 0.7–4.0)
MCH: 28.4 pg (ref 26.0–34.0)
MCHC: 35.7 g/dL (ref 30.0–36.0)
MCV: 79.5 fL (ref 78.0–100.0)
Monocytes Absolute: 0.3 10*3/uL (ref 0.1–1.0)
Monocytes Relative: 6 % (ref 3–12)
RBC: 4.58 MIL/uL (ref 3.87–5.11)

## 2012-04-12 LAB — CREATININE, URINE, RANDOM: Creatinine, Urine: 90.32 mg/dL

## 2012-04-12 LAB — PRO B NATRIURETIC PEPTIDE: Pro B Natriuretic peptide (BNP): 1485 pg/mL — ABNORMAL HIGH (ref 0–125)

## 2012-04-12 LAB — MAGNESIUM: Magnesium: 2.4 mg/dL (ref 1.5–2.5)

## 2012-04-12 LAB — TROPONIN I
Troponin I: <0.3 ng/mL (ref <0.30)
Troponin I: <0.3 ng/mL

## 2012-04-12 LAB — PROCALCITONIN: Procalcitonin: 2.76 ng/mL

## 2012-04-12 MED ORDER — BIOTENE DRY MOUTH MT LIQD
1.0000 "application " | Freq: Four times a day (QID) | OROMUCOSAL | Status: DC
  Administered 2012-04-13 – 2012-04-25 (×51): 15 mL via OROMUCOSAL

## 2012-04-12 MED ORDER — IPRATROPIUM BROMIDE 0.02 % IN SOLN
0.5000 mg | RESPIRATORY_TRACT | Status: DC
  Administered 2012-04-12 – 2012-04-15 (×16): 0.5 mg via RESPIRATORY_TRACT
  Filled 2012-04-12 (×16): qty 2.5

## 2012-04-12 MED ORDER — ALBUTEROL SULFATE (5 MG/ML) 0.5% IN NEBU
5.0000 mg | INHALATION_SOLUTION | Freq: Once | RESPIRATORY_TRACT | Status: AC
  Administered 2012-04-12: 5 mg via RESPIRATORY_TRACT
  Filled 2012-04-12: qty 1

## 2012-04-12 MED ORDER — TUBERCULIN PPD 5 UNIT/0.1ML ID SOLN
5.0000 [IU] | Freq: Once | INTRADERMAL | Status: DC
  Filled 2012-04-12: qty 0.1

## 2012-04-12 MED ORDER — SODIUM CHLORIDE 0.9 % IV BOLUS (SEPSIS)
1000.0000 mL | Freq: Once | INTRAVENOUS | Status: AC
  Administered 2012-04-12: 1000 mL via INTRAVENOUS

## 2012-04-12 MED ORDER — ENSURE COMPLETE PO LIQD: 237.0000 mL | Freq: Two times a day (BID) | ORAL | Status: DC

## 2012-04-12 MED ORDER — SODIUM CHLORIDE 0.9 % IJ SOLN
3.0000 mL | Freq: Two times a day (BID) | INTRAMUSCULAR | Status: DC
  Administered 2012-04-12 – 2012-04-13 (×2): 3 mL via INTRAVENOUS
  Administered 2012-04-14 – 2012-04-18 (×5): 10 mL via INTRAVENOUS
  Administered 2012-04-18 – 2012-04-25 (×8): 3 mL via INTRAVENOUS

## 2012-04-12 MED ORDER — ACETAMINOPHEN 650 MG RE SUPP: 650.0000 mg | Freq: Four times a day (QID) | RECTAL | Status: DC | PRN

## 2012-04-12 MED ORDER — SUCCINYLCHOLINE CHLORIDE 20 MG/ML IJ SOLN
INTRAMUSCULAR | Status: AC
  Filled 2012-04-12: qty 1

## 2012-04-12 MED ORDER — PHENYLEPHRINE HCL 10 MG/ML IJ SOLN
30.0000 ug/min | INTRAVENOUS | Status: DC
  Administered 2012-04-12: 50 ug/min via INTRAVENOUS
  Administered 2012-04-12: 70 ug/min via INTRAVENOUS
  Administered 2012-04-13: 100 ug/min via INTRAVENOUS
  Filled 2012-04-12 (×3): qty 1

## 2012-04-12 MED ORDER — SODIUM CHLORIDE 0.9 % IV BOLUS (SEPSIS): 1000.0000 mL | Freq: Once | INTRAVENOUS | Status: DC

## 2012-04-12 MED ORDER — HEPARIN SODIUM (PORCINE) 5000 UNIT/ML IJ SOLN
5000.0000 [IU] | Freq: Three times a day (TID) | INTRAMUSCULAR | Status: DC
  Administered 2012-04-12 – 2012-04-14 (×5): 5000 [IU] via SUBCUTANEOUS
  Filled 2012-04-12 (×8): qty 1

## 2012-04-12 MED ORDER — THIAMINE HCL 100 MG/ML IJ SOLN
100.0000 mg | Freq: Every day | INTRAMUSCULAR | Status: DC
  Administered 2012-04-12 – 2012-04-13 (×2): 100 mg via INTRAVENOUS
  Filled 2012-04-12 (×3): qty 1

## 2012-04-12 MED ORDER — SODIUM CHLORIDE 0.9 % IV SOLN
INTRAVENOUS | Status: DC
  Administered 2012-04-12: 150 mL/h via INTRAVENOUS
  Administered 2012-04-13 (×2): 1000 mL via INTRAVENOUS
  Administered 2012-04-13: 08:00:00 via INTRAVENOUS
  Administered 2012-04-14: 150 mL/h via INTRAVENOUS

## 2012-04-12 MED ORDER — LORAZEPAM 2 MG/ML IJ SOLN: 0.0000 mg | Freq: Four times a day (QID) | INTRAMUSCULAR | Status: DC

## 2012-04-12 MED ORDER — LEVALBUTEROL HCL 1.25 MG/0.5ML IN NEBU
1.2500 mg | INHALATION_SOLUTION | RESPIRATORY_TRACT | Status: DC | PRN
  Filled 2012-04-12: qty 0.5

## 2012-04-12 MED ORDER — ALBUTEROL SULFATE (5 MG/ML) 0.5% IN NEBU: 2.5000 mg | INHALATION_SOLUTION | RESPIRATORY_TRACT | Status: DC | PRN

## 2012-04-12 MED ORDER — CHLORHEXIDINE GLUCONATE 0.12 % MT SOLN
OROMUCOSAL | Status: AC
  Filled 2012-04-12: qty 15

## 2012-04-12 MED ORDER — METHYLPREDNISOLONE SODIUM SUCC 125 MG IJ SOLR
60.0000 mg | Freq: Four times a day (QID) | INTRAMUSCULAR | Status: DC
  Administered 2012-04-12: 60 mg via INTRAVENOUS
  Filled 2012-04-12 (×3): qty 0.96

## 2012-04-12 MED ORDER — GUAIFENESIN-DM 100-10 MG/5ML PO SYRP: 5.0000 mL | ORAL_SOLUTION | ORAL | Status: DC | PRN

## 2012-04-12 MED ORDER — METHYLPREDNISOLONE SODIUM SUCC 125 MG IJ SOLR
80.0000 mg | Freq: Four times a day (QID) | INTRAMUSCULAR | Status: DC
  Administered 2012-04-12 – 2012-04-15 (×10): 80 mg via INTRAVENOUS
  Filled 2012-04-12 (×12): qty 1.28

## 2012-04-12 MED ORDER — ADULT MULTIVITAMIN W/MINERALS CH
1.0000 | ORAL_TABLET | Freq: Every day | ORAL | Status: DC
  Filled 2012-04-12: qty 1

## 2012-04-12 MED ORDER — FOLIC ACID 1 MG PO TABS
1.0000 mg | ORAL_TABLET | Freq: Every day | ORAL | Status: DC
  Filled 2012-04-12 (×2): qty 1

## 2012-04-12 MED ORDER — FOLIC ACID 1 MG PO TABS: 1.0000 mg | ORAL_TABLET | Freq: Every day | ORAL | Status: DC

## 2012-04-12 MED ORDER — VITAMIN B-1 100 MG PO TABS
100.0000 mg | ORAL_TABLET | Freq: Every day | ORAL | Status: DC
  Filled 2012-04-12: qty 1

## 2012-04-12 MED ORDER — DEMECLOCYCLINE HCL 150 MG PO TABS: 300.0000 mg | ORAL_TABLET | Freq: Two times a day (BID) | ORAL | Status: DC

## 2012-04-12 MED ORDER — DEXTROSE 50 % IV SOLN
INTRAVENOUS | Status: AC
  Administered 2012-04-12: 25 mL
  Filled 2012-04-12: qty 50

## 2012-04-12 MED ORDER — LEVALBUTEROL HCL 1.25 MG/0.5ML IN NEBU
1.2500 mg | INHALATION_SOLUTION | RESPIRATORY_TRACT | Status: DC
  Administered 2012-04-12 – 2012-04-15 (×16): 1.25 mg via RESPIRATORY_TRACT
  Filled 2012-04-12 (×22): qty 0.5

## 2012-04-12 MED ORDER — ACETAMINOPHEN 325 MG PO TABS
650.0000 mg | ORAL_TABLET | Freq: Four times a day (QID) | ORAL | Status: DC | PRN
  Administered 2012-04-18: 650 mg via ORAL
  Filled 2012-04-12: qty 2

## 2012-04-12 MED ORDER — PANTOPRAZOLE SODIUM 40 MG IV SOLR
40.0000 mg | INTRAVENOUS | Status: DC
  Administered 2012-04-12 – 2012-04-13 (×2): 40 mg via INTRAVENOUS
  Filled 2012-04-12 (×3): qty 40

## 2012-04-12 MED ORDER — BUDESONIDE 0.5 MG/2ML IN SUSP
0.5000 mg | Freq: Two times a day (BID) | RESPIRATORY_TRACT | Status: DC
  Administered 2012-04-12 – 2012-04-25 (×25): 0.5 mg via RESPIRATORY_TRACT
  Filled 2012-04-12 (×29): qty 2

## 2012-04-12 MED ORDER — LORAZEPAM 2 MG/ML IJ SOLN: 0.0000 mg | Freq: Two times a day (BID) | INTRAMUSCULAR | Status: DC

## 2012-04-12 MED ORDER — DEXTROSE 50 % IV SOLN: 25.0000 mL | Freq: Once | INTRAVENOUS | Status: AC | PRN

## 2012-04-12 MED ORDER — CHLORHEXIDINE GLUCONATE 0.12 % MT SOLN
15.0000 mL | Freq: Two times a day (BID) | OROMUCOSAL | Status: DC
  Administered 2012-04-12 – 2012-04-25 (×26): 15 mL via OROMUCOSAL
  Filled 2012-04-12 (×25): qty 15

## 2012-04-12 MED ORDER — LEVOFLOXACIN IN D5W 750 MG/150ML IV SOLN
750.0000 mg | INTRAVENOUS | Status: DC
  Administered 2012-04-12 – 2012-04-14 (×2): 750 mg via INTRAVENOUS
  Filled 2012-04-12 (×2): qty 150

## 2012-04-12 MED ORDER — ROCURONIUM BROMIDE 50 MG/5ML IV SOLN
INTRAVENOUS | Status: AC
  Filled 2012-04-12: qty 2

## 2012-04-12 MED ORDER — SODIUM CHLORIDE 0.9 % IV SOLN
INTRAVENOUS | Status: DC
  Administered 2012-04-12: 1000 mL via INTRAVENOUS

## 2012-04-12 MED ORDER — SODIUM CHLORIDE 0.9 % IV SOLN
25.0000 ug/h | INTRAVENOUS | Status: DC
  Administered 2012-04-12: 100 ug/h via INTRAVENOUS
  Administered 2012-04-13: 85 ug/h via INTRAVENOUS
  Filled 2012-04-12 (×3): qty 50

## 2012-04-12 MED ORDER — ALBUTEROL SULFATE (5 MG/ML) 0.5% IN NEBU: 2.5000 mg | INHALATION_SOLUTION | RESPIRATORY_TRACT | Status: DC

## 2012-04-12 MED ORDER — MIDAZOLAM HCL 5 MG/ML IJ SOLN
1.0000 mg/h | INTRAMUSCULAR | Status: DC
  Administered 2012-04-12 – 2012-04-13 (×2): 1 mg/h via INTRAVENOUS
  Filled 2012-04-12 (×3): qty 10

## 2012-04-12 MED ORDER — ADULT MULTIVITAMIN W/MINERALS CH: 1.0000 | ORAL_TABLET | Freq: Every day | ORAL | Status: DC

## 2012-04-12 MED ORDER — LIDOCAINE HCL (CARDIAC) 20 MG/ML IV SOLN
INTRAVENOUS | Status: AC
  Filled 2012-04-12: qty 5

## 2012-04-12 MED ORDER — ETOMIDATE 2 MG/ML IV SOLN
INTRAVENOUS | Status: AC
  Administered 2012-04-12: 20 mg
  Filled 2012-04-12: qty 20

## 2012-04-12 MED ORDER — IPRATROPIUM BROMIDE 0.02 % IN SOLN
0.5000 mg | Freq: Once | RESPIRATORY_TRACT | Status: AC
  Administered 2012-04-12: 0.5 mg via RESPIRATORY_TRACT
  Filled 2012-04-12: qty 2.5

## 2012-04-12 MED ORDER — VITAMIN B-1 100 MG PO TABS: 100.0000 mg | ORAL_TABLET | Freq: Every day | ORAL | Status: DC

## 2012-04-12 NOTE — ED Provider Notes (Signed)
History     CSN: [id number]  Arrival date & time 04/12/12  1200   First MD Initiated Contact with Patient 04/12/12 1247      Chief Complaint  Patient presents with  . Fatigue    (Consider location/radiation/quality/duration/timing/severity/associated sxs/prior treatment) The history is provided by the patient and the EMS personnel. The history is limited by the condition of the patient.  pt w hx copd, heart disease, hyponatremia, presents from home w generalized weakness. Pt very poor historian, not responding to most questions - level 5 caveat.  Per family/friend, lives at home alone, has difficulty caring for same, has eaten/drank very little in several days, getting weaker.       Past Medical History  Diagnosis Date  . Rheumatoid arthritis   . Bronchitis   . Myocardial infarction   . Pneumonia     Past Surgical History  Procedure Laterality Date  . Tonsillectomy    . Tubal ligation      Family History  Problem Relation Age of Onset  . Coronary artery disease    . Diabetes type II      History  Substance Use Topics  . Smoking status: Current Every Day Smoker -- 1.00 packs/day for 39 years  . Smokeless tobacco: Never Used  . Alcohol Use: Yes     Comment: quart of beer per day    OB History   Grav Para Term Preterm Abortions TAB SAB Ect Mult Living                  Review of Systems  Unable to perform ROS: Mental status change  level 5 caveat  Allergies  Review of patient's allergies indicates no known allergies.  Home Medications   Current Outpatient Rx  Name  Route  Sig  Dispense  Refill  . albuterol (PROVENTIL) (5 MG/ML) 0.5% nebulizer solution   Nebulization   Take 0.5 mLs (2.5 mg total) by nebulization every 6 (six) hours as needed for wheezing or shortness of breath.   20 mL   0   . demeclocycline (DECLOMYCIN) 150 MG tablet   Oral   Take 2 tablets (300 mg total) by mouth every 12 (twelve) hours. may cause photosensitivity; discontinue if  skin erythema occurs. Use skin protection and avoid prolonged exposure to sunlight; do not use tanning equipment.   16 tablet   0   . feeding supplement (ENSURE COMPLETE) LIQD   Oral   Take 237 mLs by mouth 2 (two) times daily between meals.   10 Bottle   3   . Fluticasone-Salmeterol (ADVAIR DISKUS) 250-50 MCG/DOSE AEPB   Inhalation   Inhale 1 puff into the lungs 2 (two) times daily.   60 each   0   . guaiFENesin-dextromethorphan (ROBITUSSIN DM) 100-10 MG/5ML syrup   Oral   Take 5 mLs by mouth every 4 (four) hours as needed for cough.   118 mL   0   . metroNIDAZOLE (FLAGYL) 500 MG tablet   Oral   Take 1 tablet (500 mg total) by mouth every 12 (twelve) hours.   4 tablet   0   . tiotropium (SPIRIVA) 18 MCG inhalation capsule   Inhalation   Place 1 capsule (18 mcg total) into inhaler and inhale daily.   30 capsule   3     BP 107/92  Pulse 91  Temp(Src) 0 F (-17.8 C)  Resp 27  SpO2 92%  Physical Exam  Nursing note and vitals reviewed. Constitutional:  Very thin, cachectic appearing.  HENT:  Head: Atraumatic.  Nose: Nose normal.  Dry mm  Eyes: Conjunctivae are normal. Pupils are equal, round, and reactive to light. No scleral icterus.  Neck: Neck supple. No tracheal deviation present. No thyromegaly present.  No stiffness or rigidity  Cardiovascular: Normal rate, regular rhythm, normal heart sounds and intact distal pulses.   Pulmonary/Chest: Effort normal and breath sounds normal. No respiratory distress. She exhibits no tenderness.  Abdominal: Soft. Normal appearance and bowel sounds are normal. She exhibits no distension and no mass. There is no tenderness. There is no rebound and no guarding.  Genitourinary:  No cva tenderness  Musculoskeletal: She exhibits no edema and no tenderness.  Neurological: She is alert.  Alert, very soft voice, occasional responds briefly to questions. Moves bil extremities purposefully.   Skin: Skin is warm and dry. No rash  noted. She is not diaphoretic.  Psychiatric:  Lethargic, slow to respond.     ED Course  Procedures (including critical care time)  Results for orders placed during the hospital encounter of 04/12/12  GLUCOSE, CAPILLARY      Result Value Range   Glucose-Capillary 93  70 - 99 mg/dL  CBC WITH DIFFERENTIAL      Result Value Range   WBC 5.8  4.0 - 10.5 K/uL   RBC 4.58  3.87 - 5.11 MIL/uL   Hemoglobin 13.0  12.0 - 15.0 g/dL   HCT 40.9  81.1 - 91.4 %   MCV 79.5  78.0 - 100.0 fL   MCH 28.4  26.0 - 34.0 pg   MCHC 35.7  30.0 - 36.0 g/dL   RDW 78.2 (*) 95.6 - 21.3 %   Platelets 220  150 - 400 K/uL   Neutrophils Relative 74  43 - 77 %   Neutro Abs 4.3  1.7 - 7.7 K/uL   Lymphocytes Relative 20  12 - 46 %   Lymphs Abs 1.2  0.7 - 4.0 K/uL   Monocytes Relative 6  3 - 12 %   Monocytes Absolute 0.3  0.1 - 1.0 K/uL   Eosinophils Relative 0  0 - 5 %   Eosinophils Absolute 0.0  0.0 - 0.7 K/uL   Basophils Relative 0  0 - 1 %   Basophils Absolute 0.0  0.0 - 0.1 K/uL  COMPREHENSIVE METABOLIC PANEL      Result Value Range   Sodium 125 (*) 135 - 145 mEq/L   Potassium 4.1  3.5 - 5.1 mEq/L   Chloride 87 (*) 96 - 112 mEq/L   CO2 21  19 - 32 mEq/L   Glucose, Bld 102 (*) 70 - 99 mg/dL   BUN 32 (*) 6 - 23 mg/dL   Creatinine, Ser 0.86 (*) 0.50 - 1.10 mg/dL   Calcium 8.8  8.4 - 57.8 mg/dL   Total Protein 8.1  6.0 - 8.3 g/dL   Albumin 3.3 (*) 3.5 - 5.2 g/dL   AST 469 (*) 0 - 37 U/L   ALT 58 (*) 0 - 35 U/L   Alkaline Phosphatase 97  39 - 117 U/L   Total Bilirubin 0.5  0.3 - 1.2 mg/dL   GFR calc non Af Amer 41 (*) >90 mL/min   GFR calc Af Amer 48 (*) >90 mL/min  LACTIC ACID, PLASMA      Result Value Range   Lactic Acid, Venous 2.1  0.5 - 2.2 mmol/L  TROPONIN I      Result Value Range   Troponin I <  0.30  <0.30 ng/mL  LIPASE, BLOOD      Result Value Range   Lipase 68 (*) 11 - 59 U/L   Dg Chest Port 1 View  04/12/2012  *RADIOLOGY REPORT*  Clinical Data: Weakness and shortness of breath.   PORTABLE CHEST - 1 VIEW  Comparison: 02/16/2012  Findings: Right upper lobe infiltrate has resolved since the prior study.  Severe chronic lung disease again noted without definite focal acute process.  The heart size is normal.  IMPRESSION: Resolution of previously identified right upper lobe infiltrate. Stable severe COPD.   Original Report Authenticated By: Irish Lack, M.D.        MDM  Iv ns bolus. O2, monitor. Pcxr. Ecg. Labs. Cxs.   Lawyer.  2nd iv line.   Reviewed nursing notes and prior charts for additional history.     Date: 04/12/2012  Rate: 91  Rhythm: normal sinus rhythm  QRS Axis: right  Intervals: poor quality ecg, 'best' that could be obtained per staff, indeterminate  ST/T Wave abnormalities: indeterminate  Conduction Disutrbances:none  Narrative Interpretation:   Old EKG Reviewed: unchanged  Additional nv fluid boluses.  Recheck sl wheezing. albut and atrovent neb.  Foley. Initial no urine.  Additional ns.  Pt starting to have small amt urine output via foley. ua pending.  Pt more alert on recheck, less lethargic. No new c/o.   Discussed w triad hospitalist - state team 4, step down.  CRITICAL CARE Performed by: Suzi Roots   Total critical care time: 35  Critical care time was exclusive of separately billable procedures and treating other patients.  Critical care was necessary to treat or prevent imminent or life-threatening deterioration.  Critical care was time spent personally by me on the following activities: development of treatment plan with patient and/or surrogate as well as nursing, discussions with consultants, evaluation of patient's response to treatment, examination of patient, obtaining history from patient or surrogate, ordering and performing treatments and interventions, ordering and review of laboratory studies, ordering and review of radiographic studies, pulse oximetry and re-evaluation of patient's condition.  pts  prior wt 78#, w bun 3 and cr .3 - with todays bun 28, cr 1.4 reflective of significant dehydration/volume depletion.  Recheck abd soft nt.  ua still pending.         Suzi Roots, MD 04/12/12 [id number]

## 2012-04-12 NOTE — Procedures (Signed)
Name: [name] MRN: [id number] DOB: Jun 20, 1955   PROCEDURE NOTE  Procedure:  Endotracheal intubation.  Indication:  Acute respiratory failure  Consent:  Consent was implied due to the emergency nature of the procedure.  Anesthesia:  A total of 10 mg of Etomidate was given intravenously.  Procedure summary:  Appropriate equipment was assembled. The patient was identified as Jennifer Hurley and safety timeout was performed. The patient was placed supine, with head in sniffing position. After adequate level of anesthesia was achieved, a GS3 blade was inserted into the oropharynx and the vocal cords were visualized. A 7.5 endotracheal tube was inserted without difficulty and visualized going through the vocal cords. The stylette was removed and cuff inflated. Colorimetric change was noted on the CO2 meter. Breath sounds were heard over both lung fields equally. Post procedure chest xray was ordered.  Complications:  No immediate complications were noted.  Hemodynamic parameters and oxygenation remained stable throughout the procedure.    Orlean Bradford, M.D. Pulmonary and Critical Care Medicine [facility] Cell: [phone] Pager: [phone]  04/12/2012, 8:26 PM

## 2012-04-12 NOTE — ED Notes (Signed)
Per EMS-pt lives home alone unable to care for herself. Haven't eaten or drink anything in days. States she doesn't take any meds. Hx of emphysema and COPD. C/o of weakness.

## 2012-04-12 NOTE — Progress Notes (Signed)
[facility] ED CM reviewed  EPIC notes Spoke with pt who is only nodding in response at this time.  CM spoke with [facility] to find out that after pt last d/c she refused [facility] services. CM spoke with Darl Pikes of [facility] to follow pt for d/c needs

## 2012-04-12 NOTE — Procedures (Signed)
Arterial Catheter Insertion Procedure Note Jennifer Hurley [id number] 07-18-1955  Procedure: Insertion of Arterial Catheter  Indications: Blood pressure monitoring  Procedure Details Consent: Unable to obtain consent because of emergent medical necessity. and sedated on the ventilator Time Out: Verified patient identification, verified procedure, site/side was marked, verified correct patient position, special equipment/implants available, medications/allergies/relevent history reviewed, required imaging and test results available.  Performed  Maximum sterile technique was used including antiseptics, cap, gloves, gown, hand hygiene, mask and sheet. Skin prep: Chlorhexidine 20 gauge catheter was inserted into right radial artery using the Seldinger technique.  Evaluation Blood flow good; BP tracing good. Complications: No apparent complications.   [name] 04/12/2012

## 2012-04-12 NOTE — Procedures (Signed)
Name:  PANHIA KARL MRN:  [id number] DOB:  Jan 23, 1956  PROCEDURE NOTE  Procedure:  Central venous catheter placement.  Indications:  Need for intravenous access and hemodynamic monitoring.  Consent:  Consent was implied due to the emergency nature of the procedure.  Anesthesia:  A total of 10 mL of 1% Lidocaine was used for local infiltration anesthesia.  Procedure summary:  Appropriate equipment was assembled.  The patient was identified as Jennifer Hurley and safety timeout was performed. The patient was placed in Trendelenburg position.  Sterile technique was used. The patient's left anterior chest wall was prepped using chlorhexidine / alcohol scrub and the field was draped in usual sterile fashion with full body drape. After the adequate anesthesia was achieved, the left subclavian vein was cannulated with the introducer needle without difficulty. A guide wire was advanced through the introducer needle, which was then withdrawn. A small skin incision was made at the point of wire entry, the dilator was inserted over the guide wire and appropriate dilation was obtained. The dilator was removed and triple-lumen catheter was advanced over the guide wire, which was then removed.  All ports were aspirated and flushed with normal saline without difficulty. The catheter was secured into place withsutures. Antibiotic patch was placed and sterile dressing was applied. Post-procedure chest x-ray was ordered.  Complications:  No immediate complications were noted.  Hemodynamic parameters and oxygenation remained stable throughout the procedure.  Estimated blood loss:  Less then 5 mL.  Orlean Bradford, M.D. Pulmonary and Critical Care Medicine [facility] Cell: [phone]  04/12/2012, 8:27 PM

## 2012-04-12 NOTE — H&P (Addendum)
[facility] History and Physical  Jennifer Hurley ZOX:[id number] DOB: 12-29-1955 DOA: 04/12/2012  Referring physician:  Denton Lank PCP:  Lonia Blood, MD   Chief Complaint:  Weakness, not eating or drinking  HPI:  The patient is a 57 y.o. year-old female with history of cocaine, alcohol abuse, CAD, COPD on chronic home oxygen, cachexia, rheumatoid arthritis, recent history of necrotizing pneumonia with cavitary apical lesion who has been hospitalized several times in the last year.  She was hospitalized in 10/2011 with necrotizing pneumonia and discharged to home and then again in January 2014 with apical pneumonia, persistent hyponatremia, cachexia, and weakness.  She was brought in by her neighbors today because she has not been eating or drinking anything for the last several days.  She has been essentially bedbound.  She is a poor historian.     In the ER, she was found to be hypothermic with core temperature of 89 degrees, tachycardic to the 100s, tachypneic to the high 20s low 30s, narrow pulse pressure with SBP 107.  Wt 32.4kg.  Labs were notable for sodium 125 (near baseline), chloride 87, BUN 32, creatinine 1.41 (baseline of 0.3), lactic acid 2.1, troponin <0.3, mild transaminitis.  Review of Systems:  Denies fevers, chills, weight loss or gain, changes to hearing and vision.  Denies sinus congestion, sore throat.  Denies chest pain and palpitations.  Denies SOB, wheezing, cough.  Denies nausea, vomiting, constipation, but seems to indicate that may have had diarrhea.  Denies dysuria.  Denies coughing up blood, blood in stools.  Denies lymphadenopathy.  Unable to obtain remainder of history due to patient.    Past Medical History  Diagnosis Date  . Rheumatoid arthritis   . Bronchitis   . Myocardial infarction   . Pneumonia   . COPD (chronic obstructive pulmonary disease)   . Chronic respiratory failure   . Hyponatremia   . Alcohol abuse   . Cocaine abuse   . Tobacco abuse    Past  Surgical History  Procedure Laterality Date  . Tonsillectomy    . Tubal ligation     Social History:  reports that she has been smoking Cigarettes.  She has a 39 pack-year smoking history. She has never used smokeless tobacco. She reports that  drinks alcohol. She reports that she uses illicit drugs (Cocaine and Marijuana).   No Known Allergies  Family History  Problem Relation Age of Onset  . Coronary artery disease    . Diabetes type II       Prior to Admission medications   Medication Sig Start Date End Date Taking? Authorizing Provider  albuterol (PROVENTIL) (5 MG/ML) 0.5% nebulizer solution Take 0.5 mLs (2.5 mg total) by nebulization every 6 (six) hours as needed for wheezing or shortness of breath. 02/17/12   Alison Murray, MD  demeclocycline (DECLOMYCIN) 150 MG tablet Take 2 tablets (300 mg total) by mouth every 12 (twelve) hours. may cause photosensitivity; discontinue if skin erythema occurs. Use skin protection and avoid prolonged exposure to sunlight; do not use tanning equipment. 02/17/12   Alison Murray, MD  feeding supplement (ENSURE COMPLETE) LIQD Take 237 mLs by mouth 2 (two) times daily between meals. 02/17/12   Alison Murray, MD  Fluticasone-Salmeterol (ADVAIR DISKUS) 250-50 MCG/DOSE AEPB Inhale 1 puff into the lungs 2 (two) times daily. 02/17/12   Alison Murray, MD  guaiFENesin-dextromethorphan (ROBITUSSIN DM) 100-10 MG/5ML syrup Take 5 mLs by mouth every 4 (four) hours as needed for cough. 02/17/12   Alma  Concepcion Elk, MD  metroNIDAZOLE (FLAGYL) 500 MG tablet Take 1 tablet (500 mg total) by mouth every 12 (twelve) hours. 02/17/12   Alison Murray, MD  tiotropium (SPIRIVA) 18 MCG inhalation capsule Place 1 capsule (18 mcg total) into inhaler and inhale daily. 02/17/12   Alison Murray, MD   Physical Exam: Filed Vitals:   04/12/12 1430 04/12/12 1440 04/12/12 1445 04/12/12 1450  BP:      Pulse:      Temp: 96.9 F (36.1 C)  97.5 F (36.4 C)   Resp: 27 30  29   SpO2:         General:   Very cachectic AAF, moderate respiratory distress with SCM, subcostal, intercostal retractions  Eyes:  Lids droop over pupils occasionally during exam.  PERRL, anicteric, mildly injected.    ENT:  Nares clear.  OP clear, non-erythematous without plaques or exudates.  MMM.  Neck:  Supple without TM or JVD.    Lymph:  No cervical, supraclavicular, or submandibular LAD.    Cardiovascular:  Tachycardic, regular rhythm, normal S1, S2, 2/6 systolic murmur, no r/g.  2+ pulses, currently warm extremities  Respiratory:  Full expiratory wheeze with prolonged I:E.  Course rales, no rhonchi.    Abdomen:  NABS.  Soft, ND/NT.    Skin:  No rashes or focal lesions.  Musculoskeletal:  Normal bulk and tone.  No LE edema.  Psychiatric:  Awake, but lids droop some during exam.  Slow to answer questions.  Flat affect.  Difficult to understand and sometimes does not answer questions.  Does not regularly follow commands.    Neurologic:  No facial asymmetry.  3/5 strength throughout. Sensation grossly intact to light touch.    Labs on Admission:  Basic Metabolic Panel:  Recent Labs Lab 04/12/12 1255  NA 125*  K 4.1  CL 87*  CO2 21  GLUCOSE 102*  BUN 32*  CREATININE 1.41*  CALCIUM 8.8   Liver Function Tests:  Recent Labs Lab 04/12/12 1255  AST 263*  ALT 58*  ALKPHOS 97  BILITOT 0.5  PROT 8.1  ALBUMIN 3.3*    Recent Labs Lab 04/12/12 1255  LIPASE 68*   No results found for this basename: AMMONIA,  in the last 168 hours CBC:  Recent Labs Lab 04/12/12 1255  WBC 5.8  NEUTROABS 4.3  HGB 13.0  HCT 36.4  MCV 79.5  PLT 220   Cardiac Enzymes:  Recent Labs Lab 04/12/12 1255  TROPONINI <0.30    BNP (last 3 results)  Recent Labs  10/28/11 2052  PROBNP 133.2*   CBG:  Recent Labs Lab 04/12/12 1219  GLUCAP 93    Radiological Exams on Admission: Dg Chest Port 1 View  04/12/2012  *RADIOLOGY REPORT*  Clinical Data: Weakness and shortness of breath.  PORTABLE CHEST  - 1 VIEW  Comparison: 02/16/2012  Findings: Right upper lobe infiltrate has resolved since the prior study.  Severe chronic lung disease again noted without definite focal acute process.  The heart size is normal.  IMPRESSION: Resolution of previously identified right upper lobe infiltrate. Stable severe COPD.   Original Report Authenticated By: Irish Lack, M.D.     EKG: Independently reviewed. Sinus rhythm, poor baseline, diminished voltage of limb leads by appears normal axis.  Upright T-waves in precordial leads without evidence of acute ischemia.    Assessment/Plan Active Problems:   COPD   Hyponatremia   Systolic CHF, chronic   Chloride, decreased level   Chronic respiratory failure with hypoxia  Severe protein-calorie malnutrition   Active Problems:   COPD   Hyponatremia   Systolic CHF, chronic   Chloride, decreased level   Chronic respiratory failure with hypoxia   Severe protein-calorie malnutrition  CV:   Hypothermia, may be related to severe protein calorie malnutrition, poor oral intake, unclear housing circumstances.  No source for sepsis -  F/u blood cultures -  Continue foley temperature probe -  Continue bear hugger until temperature normal -  Fingersticks q6h  -  Continue telemetry.  At risk for arrhythmias during rewarming  PULM:   SOB with wheezing.  DDx includes acute COPD exacerbation, last FEV1 appears to be 30%, vs. Diastolic or systolic heart failure.  At risk for heart failure given anorexia.  Has history of respiratory failure secondary to COPD requiring intubation in 2009.   -  Solumedrol 60mg  IV q6h -  duoneb q4h with alb q2h prn -  Budesonide 0.5 bid -  Levofloxacin per pharm -  ECHO -  BNP 1485 - Bipap prn -  ABG:  7.1/69/104 >> Start BIPAP and repeat ABG in 1 hour -  PCCM consult pending  NEURO/PSYCH: Altered mental status with slurred speech, mild confusion, and mild somnolence, likely at least partly due to hypercapnea - electrolytes  near patient's baseline - ABG with pCO2 of 69 (baseline is 40s to 50s based on ABGs during ICU admission in 2009) - ammonia level - minimize sedating medications   Cocaine abuse, unclear when she last used.  UDS negative (so likely more than a week ago  Tobacco abuse, ongoing.  Advise to quit when more lucid  Alcohol abuse, ongoing.   -  No symptoms of withdrawal at this time -  CIWA protocol -  Telemetry -  Folate/thiamine  History of refusing treatments and capacity has been questioned during previous admissions.  Has been seen by psychiatry previously, last by Dr. Ferol Luz 02/2012.  RENAL/Electrolytes Hyponatremia.  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  She was placed on a fluid restriction and demeclocycline and her sodium has been stable.  Was seen by nephrology at her recent admission who determined that she is likely chronically volume deplete with reset osmostat and poor solute intake.  Recommended hydration unless she were to develop edema.   -  TSH was 1.89 in 02/2012 -  Cortisol was 13.3 in 02/2012  Acute kidney injury, elevated BUN:Cr, likely prerenal and may have some ATN secondary to chronic hypovolemia -  FENa 0.18% -  Renal US demonstrates normal kidney size.  No obstruction.  Increased echogenicity -  Daily weights -  STrict I/O  GI:   Transaminitis, mild with mildly elevated lipase.  May be related to dehydration versus alcoholic hepatitis versus infectious hepatitis.   -  Hep A/B/C -  Continue hydration  Globulin gap, may be related to dehydration, however, rule out other causes -  HIV tests repeatedly negative -  Likely not MM -  Hepatitis C.  Severe protein calorie malnutrition -  Regular diet -  Monitor for electrolyte derangement, including hypophosphatasia/refeeding syndrome -  Ensure supplement  ID:   History of STI -  RPR, Hep B and C screening -  HIV recently negative  Cavitary lung lesion in patient with cachexia.   Mother had hx of TB.  CXR appears improved.  PPD was recently negative, but will repeat.    -  AFB recently was negative -  Equivocal quantiferon gold -  Check PPD again  Diet:  Regular  with supplements Access:  PIV in lower extremity.  No PICC line in setting of AKI.  Reattempt PIV post hydration or consider central line IVF:  NS at 125 = 1.5 MIVF  Proph:  heparin  Code Status:  Full code pending  Family Communication:  Spoke with patient and with her son on the phone.  Son works at [employer].   Disposition Plan: admit to stepdown  Time spent: 60 min   Hurley, Jennifer [facility] Pager [phone]  If 7PM-7AM, please contact night-coverage [email or web address] Password [facility] 04/12/2012, 3:24 PM

## 2012-04-12 NOTE — Progress Notes (Signed)
ANTIBIOTIC CONSULT NOTE - INITIAL  Pharmacy Consult for Levaquin Indication: COPD Exacerbation  No Known Allergies  Patient Measurements: Height: 5' 1.02" (155 cm) Weight: 90 lb 9.7 oz (41.1 kg) IBW/kg (Calculated) : 47.86  Vital Signs: Temp: 97.9 F (36.6 C) (03/04 1616) BP: 93/63 mmHg (03/04 1616) Pulse Rate: 91 (03/04 1217) Intake/Output from previous day:   Intake/Output from this shift:    Labs:  Recent Labs  04/12/12 1255 04/12/12 1435  WBC 5.8  --   HGB 13.0  --   PLT 220  --   LABCREA  --  90.32  CREATININE 1.41*  --    Estimated Creatinine Clearance: 29.3 ml/min (by C-G formula based on Cr of 1.41). No results found for this basename: VANCOTROUGH, VANCOPEAK, VANCORANDOM, GENTTROUGH, GENTPEAK, GENTRANDOM, TOBRATROUGH, TOBRAPEAK, TOBRARND, AMIKACINPEAK, AMIKACINTROU, AMIKACIN,  in the last 72 hours   Microbiology: No results found for this or any previous visit (from the past 720 hour(s)).  Medical History: Past Medical History  Diagnosis Date  . Rheumatoid arthritis   . Bronchitis   . Myocardial infarction   . Pneumonia   . COPD (chronic obstructive pulmonary disease)   . Chronic respiratory failure   . Hyponatremia   . Alcohol abuse   . Cocaine abuse   . Tobacco abuse     Assessment:  71 yof with h/o and COPD on chronic home oxygen presented 3/4 with c/o weakness. Patient with SOB and wheezing - MD starting Levaquin per pharmacy for COPD exacerbation.    Afebrile, WBC 5.8K, Scr 1.41 for CG CrCl of 29.3 ml/min.  Blood cultures pending.   Plan:   Levaquin 750 mg IV q48 hours   Pharmacy will f/u  Geoffry Paradise, PharmD, BCPS Pager: [phone] 4:43 PM Pharmacy #: [phone]

## 2012-04-12 NOTE — ED Notes (Signed)
JYN:WGNF<AO> Expected date:<BR> Expected time:<BR> Means of arrival:<BR> Comments:<BR> Ems/ low sats

## 2012-04-12 NOTE — Consult Note (Signed)
PULMONARY  / CRITICAL CARE MEDICINE  Name: Jennifer Hurley MRN: [id number] DOB: [birth year]    ADMISSION DATE:  04/12/2012 CONSULTATION DATE:  04/12/2012  REFERRING MD :  TRH  CHIEF COMPLAINT:  Acute respiratory failure  BRIEF PATIENT DESCRIPTION:  57 yo with severe COPD, recent cavitary pneumonia and multiple recent hospitalizations for acute respiratory failure in setting of cocaine / alcohol abuse and medical noncompliance admitted 3/4 with acute respiratory failure, failed BiPAP, intubated.  SIGNIFICANT EVENTS / STUDIES:  3/4  Admitted for acute respiratory failure, failed BiPAP, intubated, CVL placed.  LINES / TUBES: OETT 3/4 >>> OGT 3/4 >>> Foley 3/4 >>> L [place] CVL 3/4 >>>  CULTURES: 3/4  Blood >>> 3/4  Respiratory >>>  ANTIBIOTICS: Levaquin 3/4 >>>  The patient is encephalopathic and unable to provide history, which was obtained for available medical records.  HISTORY OF PRESENT ILLNESS:  58 yo with severe COPD, recent cavitary pneumonia and multiple recent hospitalizations for acute respiratory failure in setting of cocaine / alcohol abuse and medical noncompliance admitted 3/4 with acute respiratory failure, failed BiPAP, intubated.  PAST MEDICAL HISTORY :  Past Medical History  Diagnosis Date  . Rheumatoid arthritis   . Bronchitis   . Myocardial infarction   . Pneumonia   . COPD (chronic obstructive pulmonary disease)   . Chronic respiratory failure   . Hyponatremia   . Alcohol abuse   . Cocaine abuse   . Tobacco abuse    Past Surgical History  Procedure Laterality Date  . Tonsillectomy    . Tubal ligation     Prior to Admission medications   Medication Sig Start Date End Date Taking? Authorizing Provider  albuterol (PROVENTIL) (5 MG/ML) 0.5% nebulizer solution Take 0.5 mLs (2.5 mg total) by nebulization every 6 (six) hours as needed for wheezing or shortness of breath. 02/17/12   Alison Murray, MD  demeclocycline (DECLOMYCIN) 150 MG tablet Take 2 tablets (300  mg total) by mouth every 12 (twelve) hours. may cause photosensitivity; discontinue if skin erythema occurs. Use skin protection and avoid prolonged exposure to sunlight; do not use tanning equipment. 02/17/12   Alison Murray, MD  feeding supplement (ENSURE COMPLETE) LIQD Take 237 mLs by mouth 2 (two) times daily between meals. 02/17/12   Alison Murray, MD  Fluticasone-Salmeterol (ADVAIR DISKUS) 250-50 MCG/DOSE AEPB Inhale 1 puff into the lungs 2 (two) times daily. 02/17/12   Alison Murray, MD  guaiFENesin-dextromethorphan (ROBITUSSIN DM) 100-10 MG/5ML syrup Take 5 mLs by mouth every 4 (four) hours as needed for cough. 02/17/12   Alison Murray, MD  metroNIDAZOLE (FLAGYL) 500 MG tablet Take 1 tablet (500 mg total) by mouth every 12 (twelve) hours. 02/17/12   Alison Murray, MD  tiotropium (SPIRIVA) 18 MCG inhalation capsule Place 1 capsule (18 mcg total) into inhaler and inhale daily. 02/17/12   Alison Murray, MD   No Known Allergies  FAMILY HISTORY:  Family History  Problem Relation Age of Onset  . Coronary artery disease    . Diabetes type II     SOCIAL HISTORY:  reports that she has been smoking Cigarettes.  She has a 39 pack-year smoking history. She has never used smokeless tobacco. She reports that  drinks alcohol. She reports that she uses illicit drugs (Cocaine and Marijuana).  REVIEW OF SYSTEMS:  Unable to provide.  INTERVAL HISTORY:  VITAL SIGNS: Temp:  [90.9 F (32.7 C)-97.9 F (36.6 C)] 97.9 F (36.6 C) (03/04 1700) Pulse Rate:  [  91-109] 109 (03/04 1849) Resp:  [23-35] 25 (03/04 1849) BP: (68-107)/(52-92) 68/52 mmHg (03/04 1900) SpO2:  [74 %-96 %] 74 % (03/04 1849) FiO2 (%):  [30 %] 30 % (03/04 1612) Weight:  [41.1 kg (90 lb 9.7 oz)] 41.1 kg (90 lb 9.7 oz) (03/04 1616) HEMODYNAMICS:   VENTILATOR SETTINGS: Vent Mode:  [-]  FiO2 (%):  [30 %] 30 % INTAKE / OUTPUT: Intake/Output   None     PHYSICAL EXAMINATION: General:  Appears acutely ill, cachectic, older than stated age,  mechanically ventilated, synchronous Neuro:  Encephalopathic, nonfocal, cough / gag diminished HEENT:  PERRL, OETT / OGT Cardiovascular:  Tachycardic, regular, no murmurs Lungs:  Bilateral diminished air entry, few expiratory wheezes Abdomen:  Soft, nontender, bowel sounds diminished Musculoskeletal:  Moves all extremities, no edema Skin:  Intact  LABS:  Recent Labs Lab 04/12/12 1255 04/12/12 1630 04/12/12 1824  HGB 13.0  --   --   WBC 5.8  --   --   PLT 220  --   --   NA 125*  --   --   K 4.1  --   --   CL 87*  --   --   CO2 21  --   --   GLUCOSE 102*  --   --   BUN 32*  --   --   CREATININE 1.41*  --   --   CALCIUM 8.8  --   --   MG 2.4  --   --   AST 263*  --   --   ALT 58*  --   --   ALKPHOS 97  --   --   BILITOT 0.5  --   --   PROT 8.1  --   --   ALBUMIN 3.3*  --   --   LATICACIDVEN 2.1  --   --   TROPONINI <0.30  --   --   PROBNP 1485.0*  --   --   PHART  --  7.121* 7.189*  PCO2ART  --  68.9* 52.2*  PO2ART  --  104.0* 75.1*    Recent Labs Lab 04/12/12 1219  GLUCAP 93   CXR:  3/4 >>> Hyperinflation, resolution of previously noted RUL infiltrate  ASSESSMENT / PLAN:  PULMONARY A:  Acute on chronic respiratory failure.  COPD with exacerbation.  Resolving cavitary RUL pneumonia, no new infiltrates.  History of recent negative PPD and equivocal quantiferon gold - doubt acute TB given improving infiltrates. P:   Gaol SpO2>92, pH>7.30 Full mechanical support Daily SBT Trend ABG / CXR Xopenex, Atrovent, Pulmicort Solu-Medrol May benefit from flexible bronchoscopy while intubated to conclude further pulmonary TB investigations  CARDIOVASCULAR A: Chronic systolic CHF without evidence of exacerbation.  Possible core pulmonale.  Hypotension, likely secondary to hypovolemia.  Possible SIRS.  CAD. P:  Goal MAP>60 Trend Lactate, Troponin 2D echo Neo-Synephrine PRN ASA CVP q4h Trend Mg, Phos  RENAL A:  Hyponatremia, chronic.  Suspected SIADH.  AKI.   Hypovolemia. P:   Goal CVP>10 Trend BMP Demeclocycline (as preadmission)  NS boluses to goal CVP of 10-12 NS @ 150  GASTROINTESTINAL A:  Severe malnutrition.  Pulmonary cachexia.  Adult failure to thrive.  Elevated transaminases in setting of ETOH.  At risk for refeeding syndrome. P:   NPO as intubated TF if remains intubated > 24 hours Nutritionist consult Protonix for GI Px Acute hepatitis panel  HEMATOLOGIC A:  No active issues. P:  Trend CBC Heparin for DVT  Px  INFECTIOUS A:  Resolving pneumonia.  Doubt TB.  Empirical coverage for COPD exacerbation. P:   Cultures and antibiotics as above PCT  ENDOCRINE  A:  Hypothermia on admission, now rewarmed.  Equivocal Cortisol level previously.  Possible adrenal insufficiency. P:   No interventions required  NEUROLOGIC A:  Acute encephalopathy, multifactorial.  History of ETOH / cocaine abuse, but clean drug screen on admission.  At risk for withdrawal.  Previous Psychiatric evaluations and questions of decision making capacity.  P:   Goal RASS 0 to -1 Fentanyl / Versed CIWA Thiamine / Folate  TODAY'S SUMMARY: 57 yo with severe COPD, recent cavitary pneumonia and multiple recent hospitalizations for acute respiratory failure in setting of cocaine / alcohol abuse and medical noncompliance admitted 3/4 with acute respiratory failure, failed BiPAP, intubated.  Tonight:  Full mechanical support, bronchodilators, steroids, Levaquin.  Possible adrenal insufficiency, but already on Solu-Medrol.  Nutritionist consult in AM.  Watch for refeeding syndrome.  Watch for ETOH withdrawal.  May need bronch while intubated as pulmonary TB was suspected earlier (not likely).   I have personally obtained a history, examined the patient, evaluated laboratory and imaging results, formulated the assessment and plan and placed orders.  CRITICAL CARE:  The patient is critically ill with multiple organ systems failure and requires high complexity  decision making for assessment and support, frequent evaluation and titration of therapies, application of advanced monitoring technologies and extensive interpretation of multiple databases. Critical Care Time devoted to patient care services described in this note is 45 minutes.   Lonia Farber, MD Pulmonary and Critical Care Medicine [facility] Pager: [phone]  04/12/2012, 7:31 PM

## 2012-04-12 NOTE — ED Notes (Signed)
Bear hugger applied 

## 2012-04-13 ENCOUNTER — Inpatient Hospital Stay (HOSPITAL_COMMUNITY): Payer: Medicare Other

## 2012-04-13 DIAGNOSIS — I369 Nonrheumatic tricuspid valve disorder, unspecified: Secondary | ICD-10-CM

## 2012-04-13 LAB — BASIC METABOLIC PANEL
BUN: 18 mg/dL (ref 6–23)
CO2: 16 mEq/L — ABNORMAL LOW (ref 19–32)
Calcium: 6.9 mg/dL — ABNORMAL LOW (ref 8.4–10.5)
Calcium: 6.9 mg/dL — ABNORMAL LOW (ref 8.4–10.5)
GFR calc Af Amer: >90 mL/min (ref >90)
GFR calc non Af Amer: >90 mL/min (ref >90)
GFR calc non Af Amer: >90 mL/min (ref >90)
Glucose, Bld: 290 mg/dL — ABNORMAL HIGH (ref 70–99)
Glucose, Bld: 171 mg/dL — ABNORMAL HIGH (ref 70–99)
Potassium: 2.9 mEq/L — ABNORMAL LOW (ref 3.5–5.1)
Potassium: 3.6 mEq/L (ref 3.5–5.1)
Sodium: 132 mEq/L — ABNORMAL LOW (ref 135–145)
Sodium: 134 mEq/L — ABNORMAL LOW (ref 135–145)

## 2012-04-13 LAB — HEPATITIS A ANTIBODY, IGM: Hep A IgM: NEGATIVE

## 2012-04-13 LAB — GLUCOSE, CAPILLARY
Glucose-Capillary: 219 mg/dL — ABNORMAL HIGH (ref 70–99)
Glucose-Capillary: 226 mg/dL — ABNORMAL HIGH (ref 70–99)

## 2012-04-13 LAB — BLOOD GAS, ARTERIAL
Acid-base deficit: 9.6 mmol/L — ABNORMAL HIGH (ref 0.0–2.0)
Bicarbonate: 16.8 mEq/L — ABNORMAL LOW (ref 20.0–24.0)
FIO2: 0.3 %
O2 Saturation: 95.7 %
Patient temperature: 37.1
pO2, Arterial: 83.1 mmHg (ref 80.0–100.0)

## 2012-04-13 LAB — MAGNESIUM: Magnesium: 1.6 mg/dL (ref 1.5–2.5)

## 2012-04-13 LAB — LACTIC ACID, PLASMA
Lactic Acid, Venous: 1.2 mmol/L (ref 0.5–2.2)
Lactic Acid, Venous: 1.8 mmol/L (ref 0.5–2.2)

## 2012-04-13 LAB — CBC
MCV: 81.2 fL (ref 78.0–100.0)
Platelets: 179 10*3/uL (ref 150–400)
RDW: 20.6 % — ABNORMAL HIGH (ref 11.5–15.5)
WBC: 5.9 10*3/uL (ref 4.0–10.5)

## 2012-04-13 LAB — HEPATITIS C ANTIBODY: HCV Ab: NEGATIVE

## 2012-04-13 LAB — HEPATITIS B CORE ANTIBODY, IGM: Hep B C IgM: NEGATIVE

## 2012-04-13 LAB — PROCALCITONIN: Procalcitonin: 1.5 ng/mL

## 2012-04-13 MED ORDER — FREE WATER
150.0000 mL | Status: DC
  Administered 2012-04-13 – 2012-04-14 (×6): 150 mL

## 2012-04-13 MED ORDER — SODIUM CHLORIDE 0.9 % IV BOLUS (SEPSIS)
500.0000 mL | Freq: Once | INTRAVENOUS | Status: AC
  Administered 2012-04-13: 500 mL via INTRAVENOUS

## 2012-04-13 MED ORDER — JEVITY 1.2 CAL PO LIQD
1000.0000 mL | ORAL | Status: DC
  Administered 2012-04-14 – 2012-04-19 (×6): 1000 mL

## 2012-04-13 MED ORDER — DEMECLOCYCLINE HCL 150 MG PO TABS
150.0000 mg | ORAL_TABLET | Freq: Two times a day (BID) | ORAL | Status: DC
  Administered 2012-04-13 – 2012-04-18 (×11): 150 mg via ORAL
  Filled 2012-04-13 (×12): qty 1

## 2012-04-13 MED ORDER — FOLIC ACID 5 MG/ML IJ SOLN
1.0000 mg | Freq: Every day | INTRAMUSCULAR | Status: DC
  Administered 2012-04-13: 1 mg via INTRAVENOUS
  Filled 2012-04-13 (×2): qty 0.2

## 2012-04-13 MED ORDER — PRO-STAT SUGAR FREE PO LIQD
30.0000 mL | ORAL | Status: DC
  Administered 2012-04-13 – 2012-04-19 (×7): 30 mL
  Filled 2012-04-13 (×8): qty 30

## 2012-04-13 MED ORDER — INSULIN ASPART 100 UNIT/ML ~~LOC~~ SOLN
0.0000 [IU] | SUBCUTANEOUS | Status: DC
  Administered 2012-04-13 (×3): 2 [IU] via SUBCUTANEOUS
  Administered 2012-04-13 (×2): 3 [IU] via SUBCUTANEOUS
  Administered 2012-04-14: 2 [IU] via SUBCUTANEOUS
  Administered 2012-04-14: 1 [IU] via SUBCUTANEOUS
  Administered 2012-04-14: 2 [IU] via SUBCUTANEOUS
  Administered 2012-04-14: 3 [IU] via SUBCUTANEOUS
  Administered 2012-04-14: 1 [IU] via SUBCUTANEOUS
  Administered 2012-04-15: 2 [IU] via SUBCUTANEOUS
  Administered 2012-04-15 (×2): 1 [IU] via SUBCUTANEOUS
  Administered 2012-04-16: 2 [IU] via SUBCUTANEOUS
  Administered 2012-04-16: 1 [IU] via SUBCUTANEOUS
  Administered 2012-04-16: 2 [IU] via SUBCUTANEOUS
  Administered 2012-04-16 – 2012-04-19 (×12): 1 [IU] via SUBCUTANEOUS
  Administered 2012-04-19: 2 [IU] via SUBCUTANEOUS
  Administered 2012-04-19: 1 [IU] via SUBCUTANEOUS
  Administered 2012-04-19: 2 [IU] via SUBCUTANEOUS
  Administered 2012-04-19 – 2012-04-20 (×4): 1 [IU] via SUBCUTANEOUS

## 2012-04-13 MED ORDER — PHENYLEPHRINE HCL 10 MG/ML IJ SOLN
30.0000 ug/min | INTRAVENOUS | Status: DC
  Administered 2012-04-13: 80 ug/min via INTRAVENOUS
  Administered 2012-04-13: 90.133 ug/min via INTRAVENOUS
  Administered 2012-04-13: 60 ug/min via INTRAVENOUS
  Administered 2012-04-13: 110 ug/min via INTRAVENOUS
  Administered 2012-04-14: 45.067 ug/min via INTRAVENOUS
  Filled 2012-04-13 (×4): qty 4

## 2012-04-13 MED ORDER — JEVITY 1.2 CAL PO LIQD
1000.0000 mL | ORAL | Status: DC
  Administered 2012-04-13: 1000 mL

## 2012-04-13 MED ORDER — ADULT MULTIVITAMIN LIQUID CH
5.0000 mL | Freq: Every day | ORAL | Status: DC
  Administered 2012-04-13 – 2012-04-19 (×7): 5 mL
  Filled 2012-04-13 (×7): qty 5

## 2012-04-13 MED ORDER — POTASSIUM CHLORIDE 10 MEQ/50ML IV SOLN
10.0000 meq | INTRAVENOUS | Status: AC
  Administered 2012-04-13 (×4): 10 meq via INTRAVENOUS
  Filled 2012-04-13 (×4): qty 50

## 2012-04-13 NOTE — Consult Note (Addendum)
PULMONARY  / CRITICAL CARE MEDICINE  Name: Jennifer Hurley MRN: [id number] DOB: [birth year]    ADMISSION DATE:  04/12/2012 CONSULTATION DATE:  04/12/2012  REFERRING MD :  TRH  CHIEF COMPLAINT:  Acute respiratory failure  BRIEF PATIENT DESCRIPTION:  57 yo with severe COPD, recent cavitary pneumonia and multiple recent hospitalizations for acute respiratory failure in setting of cocaine / alcohol abuse and medical noncompliance admitted 3/4 with acute respiratory failure, failed BiPAP, intubated.  SIGNIFICANT EVENTS / STUDIES:  3/4  Admitted for acute respiratory failure, failed BiPAP, intubated, CVL placed.  LINES / TUBES: OETT 3/4 >>> OGT 3/4 >>> Foley 3/4 >>> L  CVL 3/4 >>> 3/4 rt rad a line>>  CULTURES: 3/4  Blood >>> 3/4  Respiratory >>>  ANTIBIOTICS: Levaquin 3/4 >>>  VITAL SIGNS: Temp:  [90.9 F (32.7 C)-99 F (37.2 C)] 97.7 F (36.5 C) (03/05 0400) Pulse Rate:  [29-124] 66 (03/05 0930) Resp:  [16-35] 16 (03/05 0930) BP: (68-131)/(51-92) 122/78 mmHg (03/05 0930) SpO2:  [70 %-100 %] 100 % (03/05 0930) Arterial Line BP: (70-136)/(40-72) 124/68 mmHg (03/05 0930) FiO2 (%):  [30 %-100 %] 30 % (03/05 0848) Weight:  [36.6 kg (80 lb 11 oz)-41.2 kg (90 lb 13.3 oz)] 36.6 kg (80 lb 11 oz) (03/05 0500) HEMODYNAMICS: CVP:  [5 mmHg-10 mmHg] 6 mmHg VENTILATOR SETTINGS: Vent Mode:  [-] PRVC FiO2 (%):  [30 %-100 %] 30 % Set Rate:  [16 bmp] 16 bmp Vt Set:  [400 mL] 400 mL PEEP:  [5 cmH20] 5 cmH20 Plateau Pressure:  [14 cmH20-16 cmH20] 16 cmH20 INTAKE / OUTPUT: Intake/Output     03/04 0701 - 03/05 0700 03/05 0701 - 03/06 0700   I.V. (mL/kg) 2508.9 (68.6) 408.2 (11.2)   IV Piggyback 1050 50   Total Intake(mL/kg) 3558.9 (97.2) 458.2 (12.5)   Urine (mL/kg/hr) 885    Total Output 885     Net +2673.9 +458.2        PHYSICAL EXAMINATION: General:  Appears acutely ill, cachectic, older than stated age, mechanically ventilated, synchronous Neuro:  Encephalopathic, nonfocal, cough  / gag diminished HEENT:  PERRL, OETT / OGT Cardiovascular:  Tachycardic, regular, no murmurs Lungs:  Bilateral diminished air entry, few expiratory wheezes Abdomen:  Soft, nontender, bowel sounds diminished Musculoskeletal:  Moves all extremities, no edema Skin:  Intact  LABS:  Recent Labs Lab 04/12/12 1255 04/12/12 1630 04/12/12 1824 04/12/12 1938 04/12/12 2020 04/12/12 2238 04/13/12 0135 04/13/12 0425  HGB 13.0  --   --   --   --   --   --  10.5*  WBC 5.8  --   --   --   --   --   --  5.9  PLT 220  --   --   --   --   --   --  179  NA 125*  --   --   --   --   --   --  132*  K 4.1  --   --   --   --   --   --  2.9*  CL 87*  --   --   --   --   --   --  102  CO2 21  --   --   --   --   --   --  18*  GLUCOSE 102*  --   --   --   --   --   --  290*  BUN 32*  --   --   --   --   --   --  18  CREATININE 1.41*  --   --   --   --   --   --  0.67  CALCIUM 8.8  --   --   --   --   --   --  6.9*  MG 2.4  --   --   --   --   --   --  1.6  PHOS  --   --   --   --   --   --   --  2.3  AST 263*  --   --   --   --   --   --   --   ALT 58*  --   --   --   --   --   --   --   ALKPHOS 97  --   --   --   --   --   --   --   BILITOT 0.5  --   --   --   --   --   --   --   PROT 8.1  --   --   --   --   --   --   --   ALBUMIN 3.3*  --   --   --   --   --   --   --   LATICACIDVEN 2.1  --   --   --  1.0  --  1.2  --   TROPONINI <0.30  --   --   --  <0.30  --  <0.30  --   PROCALCITON  --   --   --  2.76  --   --   --  1.50  PROBNP 1485.0*  --   --   --   --   --   --   --   PHART  --  7.121* 7.189*  --   --  7.214*  --   --   PCO2ART  --  68.9* 52.2*  --   --  42.7  --   --   PO2ART  --  104.0* 75.1*  --   --  212.0*  --   --     Recent Labs Lab 04/12/12 2053 04/12/12 2107 04/13/12 0008 04/13/12 0441 04/13/12 0727  GLUCAP 67* 213* 219* 249* 226*   .imaging: Dg Chest 1 View  04/12/2012  *RADIOLOGY REPORT*  Clinical Data: 57 year old female intubated and central line placement.   CHEST - 1 VIEW  Comparison: 1258 hours the same day and earlier.  Findings: Portable semi upright AP views of the chest at 2033 hours.  Endotracheal tube tip in place, projects about halfway between the level of clavicles and carina.  Left subclavian approach central line in place, tip at the cavoatrial junction level.  Enteric tube placed, courses to the left upper quadrant with tip not included.  Stable large lung volumes and patchy bilateral reticulonodular density.  Evidence of left upper lobe emphysema.  No pneumothorax, pleural effusion or pulmonary edema identified.  Stable cardiac size and mediastinal contours.  IMPRESSION: 1.  Intubated with endotracheal tube and enteric tube in good position. 2.  Left subclavian central line placed, tip at the cavoatrial junction level. 3.  Stable lungs.  No pneumothorax.   Original Report Authenticated By: Erskine Speed, M.D.    US Renal  04/12/2012  *RADIOLOGY REPORT*  Clinical Data: History of acute renal injury.  History of dehydration.  RENAL/URINARY TRACT  ULTRASOUND COMPLETE  Comparison:  None.  Findings:  Right Kidney:  Right renal length is 9.8 cm.  There is a small amount of urine in the collecting system of the right kidney with mild pyelectasis. No definite obstructive hydronephrosis is seen. There is increased echogenicity of the cortex with prominent pyramids.  Left Kidney:  Left renal length is 10.5 cm. There is a small amount of urine in the collecting system of the left kidney with mild pyelectasis. There is increased echogenicity of cortex with prominent pyramids.  Examination of each kidney showed no evidence of diffuse or focal parenchymal loss, calculus, cyst, or solid mass.  Bladder:  Urinary bladder was empty with catheter in place.  IMPRESSION: Kidneys are normal size. There is a small amount of urine in the collecting system of each  kidney with mild pyelectasis. No definite obstructive hydronephrosis is seen.  There is increased echogenicity of the  cortex with prominent pyramids. History given of dehydration.   Original Report Authenticated By: Onalee Hua Call    Dg Chest Port 1 View  04/13/2012  *RADIOLOGY REPORT*  Clinical Data: Status post intubation  PORTABLE CHEST - 1 VIEW  Comparison: 04/12/2012  Findings: There is an ET tube with 2.5 cm above the carina.  Nasogastric tube tip is in the stomach.  There is a left subclavian catheter with tip in the cavoatrial junction.  The lungs are hyperinflated and there are coarsened interstitial markings noted bilaterally.  Patchy airspace opacification within the left lower lobe and right upper lobe is similar to previous exam.  IMPRESSION:  1.  Stable position of endotracheal tube with tip above the carina. 2.  No change in aeration to the lungs.   Original Report Authenticated By: Signa Kell, M.D.    Dg Chest Port 1 View  04/12/2012  *RADIOLOGY REPORT*  Clinical Data: Weakness and shortness of breath.  PORTABLE CHEST - 1 VIEW  Comparison: 02/16/2012  Findings: Right upper lobe infiltrate has resolved since the prior study.  Severe chronic lung disease again noted without definite focal acute process.  The heart size is normal.  IMPRESSION: Resolution of previously identified right upper lobe infiltrate. Stable severe COPD.   Original Report Authenticated By: Irish Lack, M.D.      ASSESSMENT / PLAN:  PULMONARY A:  Acute on chronic respiratory failure.  COPD with exacerbation.  Resolving cavitary RUL pneumonia, no new infiltrates.  History of recent negative PPD and equivocal quantiferon gold - doubt acute TB given improving infiltrates. P:   Goal SpO2>92, pH>7.30 Full mechanical support ABG now to address metabolic acidosis. Trend ABG / CXR. Xopenex, Atrovent, Pulmicort. Solu-Medrol as ordered. Will plan to bronchoscope later today or in AM for ?pulmonary TB pending time constraints.  CARDIOVASCULAR A: Chronic systolic CHF without evidence of exacerbation.  Possible core pulmonale.   Hypotension, likely secondary to hypovolemia.  Possible SIRS.  CAD. P:  Goal MAP>60 Lactate 1.2, Troponin negative 2D echo pending Neo-Synephrine at 60 mcg. ASA. CVP q4h. Trend Mg, Phos.  RENAL A:  Hyponatremia, chronic.  Suspected SIADH.  AKI.  Hypovolemia. P:   Goal CVP>10. Trend BMP. Demeclocycline (as preadmission), will restart at 150 q12 hours. NS boluses to goal CVP of 10-12 NS @ 150 ml/hr.  GASTROINTESTINAL A:  Severe malnutrition.  Pulmonary cachexia.  Adult failure to thrive.  Elevated transaminases in setting of ETOH.  At risk for refeeding syndrome. P:   TF start 3/5  Nutritionist consult Protonix for GI Px Acute hepatitis panel  HEMATOLOGIC  A:  No active issues. P:  Trend CBC Heparin for DVT Px  INFECTIOUS A:  Resolving pneumonia.  Doubt TB.  Empirical coverage for COPD exacerbation. P:   Cultures and antibiotics as above PCT  ENDOCRINE  A:  Hypothermia on admission, now rewarmed.  Equivocal Cortisol level previously.  Possible adrenal insufficiency. P:   No interventions required  NEUROLOGIC A:  Acute encephalopathy, multifactorial.  History of ETOH / cocaine abuse, but clean drug screen on admission.  At risk for withdrawal.  Previous Psychiatric evaluations and questions of decision making capacity.  P:   Goal RASS 0 to -1 Fentanyl / Versed CIWA Thiamine / Folate  TODAY'S SUMMARY: (57 yo with severe COPD, recent cavitary pneumonia and multiple recent hospitalizations for acute respiratory failure in setting of cocaine / alcohol abuse and medical noncompliance admitted 3/4 with acute respiratory failure, failed BiPAP, intubated.  Tonight:  Full mechanical support, bronchodilators, steroids, Levaquin.  Possible adrenal insufficiency, but already on Solu-Medrol.  Nutritionist consult in AM.  Watch for refeeding syndrome.  Watch for ETOH withdrawal.  May need bronch while intubated as pulmonary TB was suspected earlier (not likely). )  3/5 wean as  tolerated, start tf, hydrate.  Brett Canales Minor ACNP [facility] Pager [phone] till 3 pm If no answer page [phone] 04/13/2012, 9:34 AM  CC time 35 min.  Patient seen and examined, agree with above note.  I dictated the care and orders written for this patient under my direction.  Alyson Reedy, MD [phone]

## 2012-04-13 NOTE — Progress Notes (Signed)
[id number] Earlene Plater, RN, BSN, CCM:  CHART REVIEWED AND UPDATED.  Next chart review due on [id number]. NO DISCHARGE NEEDS PRESENT AT THIS TIME. CASE MANAGEMENT [id number]

## 2012-04-13 NOTE — Progress Notes (Signed)
[facility] PROGRESS NOTE  Jennifer Hurley ZOX:[id number] DOB: Dec 18, 1955 DOA: 04/12/2012 PCP: Lonia Blood, MD  Assessment/Plan: 1. Hypothermia - resolved, agree that this may have been multifactorial (social vs poor oral intake/protein calorie malnutrition vs infectious)  - Blood cultures with no growth to date  2. Hypotension - central line in place - patient on phenylephrine - PCCM managing  3. Respiratory failure - At this juncture patient is on ventilator - PCCM following and managing  4. COPD - Pt on pulmicort, atrovent, xopenex, solumedrol, and levaquin. Plan on continuing at this juncture - PCCM on board  5. AMS - In context of respiratory failure in patient with COPD on home oxygen as well as h/o marijuana and cocaine use at home - continue to monitor for changes currently sedated and intubated.  6. Alcohol abuse - continue ciwa protocol  7. Hypokalemia - Currently being replaced via central line and patient to receive 40 meq total via central line today 3/5 - Will recheck potassium levels this afternoon after levels have been repleted IV - continue to monitor on telemetry  8. Hyponatremia - Improving with IVF rehydration at this juncture and likely due to poor oral solute intake based on history and improvement while on IVF's - sodium 132 this am. - Given improvement of hypotention and hyponatremia will plan on continuing current IVF's  9. Cavitary lung lesion on cxr - Pulmonology on board and considering bronchoscopy for further evaluation  Code Status: full Family Communication: no family at bedside.  Disposition Plan: Pending improvement in condition.  Still acute   Consultants:  PCCM  Procedures:  Central line placement 04/12/12  Antibiotics:  Levaquin  HPI/Subjective: Pt is currently sedated and on ventilator  Objective: Filed Vitals:   04/13/12 0430 04/13/12 0500 04/13/12 0600 04/13/12 0700  BP: 102/61 110/66 111/70 123/76  Pulse:  76 74 71 68  Temp:      TempSrc:      Resp: 16 16 16 16   Height:      Weight:  36.6 kg (80 lb 11 oz)    SpO2: 100% 100% 99% 100%    Intake/Output Summary (Last 24 hours) at 04/13/12 0755 Last data filed at 04/13/12 0643  Gross per 24 hour  Intake 3558.93 ml  Output    885 ml  Net 2673.93 ml   Filed Weights   04/12/12 1616 04/12/12 2018 04/13/12 0500  Weight: 41.1 kg (90 lb 9.7 oz) 41.2 kg (90 lb 13.3 oz) 36.6 kg (80 lb 11 oz)    Exam:   General:  Pt laying supine intubated and sedated  Cardiovascular: RRR, No murmurs or rubs  Respiratory: Breath sounds BL, no wheezes  Abdomen: soft, ND  Musculoskeletal: no cyanosis or clubbing   Data Reviewed: Basic Metabolic Panel:  Recent Labs Lab 04/12/12 1255 04/13/12 0425  NA 125* 132*  K 4.1 2.9*  CL 87* 102  CO2 21 18*  GLUCOSE 102* 290*  BUN 32* 18  CREATININE 1.41* 0.67  CALCIUM 8.8 6.9*  MG 2.4 1.6  PHOS  --  2.3   Liver Function Tests:  Recent Labs Lab 04/12/12 1255  AST 263*  ALT 58*  ALKPHOS 97  BILITOT 0.5  PROT 8.1  ALBUMIN 3.3*    Recent Labs Lab 04/12/12 1255  LIPASE 68*    Recent Labs Lab 04/12/12 1646  AMMONIA 36   CBC:  Recent Labs Lab 04/12/12 1255 04/13/12 0425  WBC 5.8 5.9  NEUTROABS 4.3  --   HGB 13.0  10.5*  HCT 36.4 30.7*  MCV 79.5 81.2  PLT 220 179   Cardiac Enzymes:  Recent Labs Lab 04/12/12 1255 04/12/12 2020 04/13/12 0135  TROPONINI <0.30 <0.30 <0.30   BNP (last 3 results)  Recent Labs  10/28/11 2052 04/12/12 1255  PROBNP 133.2* 1485.0*   CBG:  Recent Labs Lab 04/12/12 2053 04/12/12 2107 04/13/12 0008 04/13/12 0441 04/13/12 0727  GLUCAP 67* 213* 219* 249* 226*    Recent Results (from the past 240 hour(s))  CULTURE, BLOOD (ROUTINE X 2)     Status: None   Collection Time    04/12/12 12:50 PM      Result Value Range Status   Specimen Description BLOOD LEFT FEMORAL   Final   Special Requests BOTTLES DRAWN AEROBIC ONLY   Final    Culture  Setup Time 04/12/2012 21:29   Final   Culture     Final   Value:        BLOOD CULTURE RECEIVED NO GROWTH TO DATE CULTURE WILL BE HELD FOR 5 DAYS BEFORE ISSUING A FINAL NEGATIVE REPORT   Report Status PENDING   Incomplete  CULTURE, BLOOD (ROUTINE X 2)     Status: None   Collection Time    04/12/12  1:20 PM      Result Value Range Status   Specimen Description BLOOD RIGHT FOREARM   Final   Special Requests BOTTLES DRAWN AEROBIC ONLY   Final   Culture  Setup Time 04/12/2012 21:29   Final   Culture     Final   Value:        BLOOD CULTURE RECEIVED NO GROWTH TO DATE CULTURE WILL BE HELD FOR 5 DAYS BEFORE ISSUING A FINAL NEGATIVE REPORT   Report Status PENDING   Incomplete  MRSA PCR SCREENING     Status: None   Collection Time    04/12/12  4:04 PM      Result Value Range Status   MRSA by PCR NEGATIVE  NEGATIVE Final   Comment:            The GeneXpert MRSA Assay (FDA     approved for NASAL specimens     only), is one component of a     comprehensive MRSA colonization     surveillance program. It is not     intended to diagnose MRSA     infection nor to guide or     monitor treatment for     MRSA infections.     Studies: Dg Chest 1 View  04/12/2012  *RADIOLOGY REPORT*  Clinical Data: 57 year old female intubated and central line placement.  CHEST - 1 VIEW  Comparison: 1258 hours the same day and earlier.  Findings: Portable semi upright AP views of the chest at 2033 hours.  Endotracheal tube tip in place, projects about halfway between the level of clavicles and carina.  Left subclavian approach central line in place, tip at the cavoatrial junction level.  Enteric tube placed, courses to the left upper quadrant with tip not included.  Stable large lung volumes and patchy bilateral reticulonodular density.  Evidence of left upper lobe emphysema.  No pneumothorax, pleural effusion or pulmonary edema identified.  Stable cardiac size and mediastinal contours.  IMPRESSION: 1.   Intubated with endotracheal tube and enteric tube in good position. 2.  Left subclavian central line placed, tip at the cavoatrial junction level. 3.  Stable lungs.  No pneumothorax.   Original Report Authenticated By: Odessa Fleming III,  M.D.    US Renal  04/12/2012  *RADIOLOGY REPORT*  Clinical Data: History of acute renal injury.  History of dehydration.  RENAL/URINARY TRACT ULTRASOUND COMPLETE  Comparison:  None.  Findings:  Right Kidney:  Right renal length is 9.8 cm.  There is a small amount of urine in the collecting system of the right kidney with mild pyelectasis. No definite obstructive hydronephrosis is seen. There is increased echogenicity of the cortex with prominent pyramids.  Left Kidney:  Left renal length is 10.5 cm. There is a small amount of urine in the collecting system of the left kidney with mild pyelectasis. There is increased echogenicity of cortex with prominent pyramids.  Examination of each kidney showed no evidence of diffuse or focal parenchymal loss, calculus, cyst, or solid mass.  Bladder:  Urinary bladder was empty with catheter in place.  IMPRESSION: Kidneys are normal size. There is a small amount of urine in the collecting system of each  kidney with mild pyelectasis. No definite obstructive hydronephrosis is seen.  There is increased echogenicity of the cortex with prominent pyramids. History given of dehydration.   Original Report Authenticated By: Onalee Hua Call    Dg Chest Port 1 View  04/13/2012  *RADIOLOGY REPORT*  Clinical Data: Status post intubation  PORTABLE CHEST - 1 VIEW  Comparison: 04/12/2012  Findings: There is an ET tube with 2.5 cm above the carina.  Nasogastric tube tip is in the stomach.  There is a left subclavian catheter with tip in the cavoatrial junction.  The lungs are hyperinflated and there are coarsened interstitial markings noted bilaterally.  Patchy airspace opacification within the left lower lobe and right upper lobe is similar to previous exam.   IMPRESSION:  1.  Stable position of endotracheal tube with tip above the carina. 2.  No change in aeration to the lungs.   Original Report Authenticated By: Signa Kell, M.D.    Dg Chest Port 1 View  04/12/2012  *RADIOLOGY REPORT*  Clinical Data: Weakness and shortness of breath.  PORTABLE CHEST - 1 VIEW  Comparison: 02/16/2012  Findings: Right upper lobe infiltrate has resolved since the prior study.  Severe chronic lung disease again noted without definite focal acute process.  The heart size is normal.  IMPRESSION: Resolution of previously identified right upper lobe infiltrate. Stable severe COPD.   Original Report Authenticated By: Irish Lack, M.D.     Scheduled Meds: . antiseptic oral rinse  1 application Mouth Rinse QID  . budesonide (PULMICORT) nebulizer solution  0.5 mg Nebulization BID  . chlorhexidine  15 mL Mouth/Throat BID  . folic acid  1 mg Oral Daily  . heparin  5,000 Units Subcutaneous Q8H  . insulin aspart  0-9 Units Subcutaneous Q4H  . ipratropium  0.5 mg Nebulization Q4H  . levalbuterol  1.25 mg Nebulization Q4H  . levofloxacin (LEVAQUIN) IV  750 mg Intravenous Q48H  . lidocaine (cardiac) 100 mg/6ml      . methylPREDNISolone (SOLU-MEDROL) injection  80 mg Intravenous Q6H  . pantoprazole (PROTONIX) IV  40 mg Intravenous Q24H  . potassium chloride  10 mEq Intravenous Q1 Hr x 4  . rocuronium      . sodium chloride  1,000 mL Intravenous Once  . sodium chloride  3 mL Intravenous Q12H  . succinylcholine      . thiamine  100 mg Intravenous Daily   Continuous Infusions: . sodium chloride 150 mL/hr (04/12/12 2356)  . fentaNYL infusion INTRAVENOUS 100 mcg/hr (04/12/12 2040)  . midazolam (  VERSED) infusion 1 mg/hr (04/12/12 2124)  . phenylephrine (NEO-SYNEPHRINE) Adult infusion 115 mcg/min (04/13/12 0555)    Active Problems:   COPD   Hyponatremia   Systolic CHF, chronic   Chloride, decreased level   Chronic respiratory failure with hypoxia   Severe protein-calorie  malnutrition   Acute respiratory failure   COPD exacerbation    Time spent: > 35 minutes     Pia  [facility] Pager [phone]. If 7PM-7AM, please contact night-coverage at [email or web address], password [facility] 04/13/2012, 7:55 AM  LOS: 1 day
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
## 2012-04-13 NOTE — Progress Notes (Signed)
INITIAL NUTRITION ASSESSMENT  DOCUMENTATION CODES Per approved criteria  -Underweight   INTERVENTION: Initiate Jevity 1.2 @ 20 ml/hr via OG tube and increase by 10 ml every 8 hours to goal rate of 35 ml/hr. 30 ml Prostat q 24hrs.  At goal rate, tube feeding regimen will provide 1108 kcal, 62 grams of protein, and 760 ml of H2O.  Recommend 150 ml free water flushes q 4 hrs TF regimen will provide 102% of estimated energy needs and 100% of estimated protein needs.  Provide Multivitamin daily due to tube feeds being less than 1 L/day  Monitor magnesium, potassium, and phosphorus daily for at least 3 days, MD to replete as needed, as pt is at risk for refeeding syndrome given unknown history and BMI of 14.75.   NUTRITION DIAGNOSIS: Inadequate oral intake related to inability to eat as evidenced by NPO status and pt on vent.   Goal: Pt to meet >/= 90% of their estimated nutrition needs  Monitor:  TF tolerance Wt Respiratory status Magnesium, potassium, and phosphorus  Reason for Assessment: Consult for Tube Feed Managment  57 y.o. female  Admitting Dx: Acute Respiratory Failure  ASSESSMENT: 57 yo with severe COPD, recent cavitary pneumonia and multiple recent hospitalizations for acute respiratory failure in setting of cocaine / alcohol abuse and medical noncompliance admitted 3/4 with acute respiratory failure, failed BiPAP, intubated. Tube feeds were initiated around 10 AM this morning- Jevity 1.2 @ 20 ml/hr   Height: Ht Readings from Last 1 Encounters:  04/12/12 5\' 2"  (1.575 m)    Weight: Wt Readings from Last 1 Encounters:  04/13/12 80 lb 11 oz (36.6 kg)    Ideal Body Weight: 110 lbs  % Ideal Body Weight: 73%  Wt Readings from Last 10 Encounters:  04/13/12 80 lb 11 oz (36.6 kg)  02/12/12 71 lb 8 oz (32.432 kg)  11/03/11 78 lb 4.2 oz (35.5 kg)  11/02/11 78 lb 3.2 oz (35.471 kg)  02/27/08 87 lb (39.463 kg)  01/19/08 90 lb 6.1 oz (40.997 kg)  11/03/07 88 lb  (39.917 kg)    Usual Body Weight: Unknown  % Usual Body Weight: NA  BMI:  Body mass index is 14.75 kg/(m^2).  Patient is currently intubated on ventilator support.  MV: 6.6 Temp:Temp (24hrs), Avg:96.3 F (35.7 C), Min:90.9 F (32.7 C), Max:99 F (37.2 C)   Estimated Nutritional Needs: Kcal: 1087 Protein: 55-75 grams Fluid: 1.7 L  Skin: WDL  Diet Order: NPO  EDUCATION NEEDS: -No education needs identified at this time   Intake/Output Summary (Last 24 hours) at 04/13/12 1040 Last data filed at 04/13/12 0953  Gross per 24 hour  Intake 4017.13 ml  Output   1060 ml  Net 2957.13 ml    Last BM: 3/4  Labs:   Recent Labs Lab 04/12/12 1255 04/13/12 0425  NA 125* 132*  K 4.1 2.9*  CL 87* 102  CO2 21 18*  BUN 32* 18  CREATININE 1.41* 0.67  CALCIUM 8.8 6.9*  MG 2.4 1.6  PHOS  --  2.3  GLUCOSE 102* 290*    CBG (last 3)   Recent Labs  04/13/12 0008 04/13/12 0441 04/13/12 0727  GLUCAP 219* 249* 226*    Scheduled Meds: . antiseptic oral rinse  1 application Mouth Rinse QID  . budesonide (PULMICORT) nebulizer solution  0.5 mg Nebulization BID  . chlorhexidine  15 mL Mouth/Throat BID  . feeding supplement (JEVITY 1.2 CAL)  1,000 mL Per Tube Q24H  . folic acid  1 mg Intravenous Daily  . heparin  5,000 Units Subcutaneous Q8H  . insulin aspart  0-9 Units Subcutaneous Q4H  . ipratropium  0.5 mg Nebulization Q4H  . levalbuterol  1.25 mg Nebulization Q4H  . levofloxacin (LEVAQUIN) IV  750 mg Intravenous Q48H  . methylPREDNISolone (SOLU-MEDROL) injection  80 mg Intravenous Q6H  . pantoprazole (PROTONIX) IV  40 mg Intravenous Q24H  . sodium chloride  1,000 mL Intravenous Once  . sodium chloride  3 mL Intravenous Q12H  . thiamine  100 mg Intravenous Daily    Continuous Infusions: . sodium chloride 150 mL/hr at 04/13/12 0758  . fentaNYL infusion INTRAVENOUS 100 mcg/hr (04/12/12 2040)  . midazolam (VERSED) infusion 1 mg/hr (04/12/12 2124)  . phenylephrine  (NEO-SYNEPHRINE) Adult infusion 70 mcg/min (04/13/12 1610)    Past Medical History  Diagnosis Date  . Rheumatoid arthritis   . Bronchitis   . Myocardial infarction   . Pneumonia   . COPD (chronic obstructive pulmonary disease)   . Chronic respiratory failure   . Hyponatremia   . Alcohol abuse   . Cocaine abuse   . Tobacco abuse     Past Surgical History  Procedure Laterality Date  . Tonsillectomy    . Tubal ligation      Ian Malkin RD, LDN Inpatient Clinical Dietitian Pager: [phone] After Hours Pager: [phone]

## 2012-04-13 NOTE — Progress Notes (Signed)
Hypokalemia   K repalced 

## 2012-04-13 NOTE — Progress Notes (Signed)
Inpatient Diabetes Program Recommendations  AACE/ADA: New Consensus Statement on Inpatient Glycemic Control (2013)  Target Ranges:  Prepandial:   less than 140 mg/dL      Peak postprandial:   less than 180 mg/dL (1-2 hours)      Critically ill patients:  140 - 180 mg/dL   Reason for Visit: Hyperglycemia in the presence of high dose steroid therapy.  Please consider using the ICU Hyperglycemia order set while patient is on steroid therapy.   Note: Thank you, Lenor Coffin, RN, CNS, Diabetes Coordinator [phone])

## 2012-04-13 NOTE — Progress Notes (Signed)
  Echocardiogram 2D Echocardiogram has been performed.  MORFORD, MELISSA 04/13/2012, 11:42 AM

## 2012-04-13 NOTE — Plan of Care (Signed)
Problem: ICU Phase Progression Outcomes Goal: Initial discharge plan identified Outcome: Completed/Met Date Met:  04/13/12 Lives alone

## 2012-04-14 ENCOUNTER — Inpatient Hospital Stay (HOSPITAL_COMMUNITY): Payer: Medicare Other

## 2012-04-14 LAB — CBC
HCT: 28 % — ABNORMAL LOW (ref 36.0–46.0)
Hemoglobin: 9.9 g/dL — ABNORMAL LOW (ref 12.0–15.0)
MCV: 81.2 fL (ref 78.0–100.0)
RBC: 3.45 MIL/uL — ABNORMAL LOW (ref 3.87–5.11)
RDW: 21 % — ABNORMAL HIGH (ref 11.5–15.5)
WBC: 4.3 10*3/uL (ref 4.0–10.5)

## 2012-04-14 LAB — BLOOD GAS, ARTERIAL
Acid-base deficit: 7.3 mmol/L — ABNORMAL HIGH (ref 0.0–2.0)
Bicarbonate: 18.5 mEq/L — ABNORMAL LOW (ref 20.0–24.0)
Drawn by: 232811
FIO2: 0.3 %
RATE: 16 resp/min
pCO2 arterial: 39.4 mmHg (ref 35.0–45.0)
pO2, Arterial: 80.3 mmHg (ref 80.0–100.0)

## 2012-04-14 LAB — BASIC METABOLIC PANEL
BUN: 12 mg/dL (ref 6–23)
CO2: 19 mEq/L (ref 19–32)
CO2: 19 mEq/L (ref 19–32)
Chloride: 110 mEq/L (ref 96–112)
GFR calc Af Amer: >90 mL/min (ref >90)
GFR calc non Af Amer: >90 mL/min (ref >90)
Glucose, Bld: 186 mg/dL — ABNORMAL HIGH (ref 70–99)
Glucose, Bld: 208 mg/dL — ABNORMAL HIGH (ref 70–99)
Potassium: 2.7 mEq/L — CL (ref 3.5–5.1)
Potassium: 3.4 mEq/L — ABNORMAL LOW (ref 3.5–5.1)
Sodium: 134 mEq/L — ABNORMAL LOW (ref 135–145)

## 2012-04-14 LAB — PROTIME-INR
INR: 0.98 (ref 0.00–1.49)
Prothrombin Time: 12.9 seconds (ref 11.6–15.2)

## 2012-04-14 LAB — GLUCOSE, CAPILLARY
Glucose-Capillary: 202 mg/dL — ABNORMAL HIGH (ref 70–99)
Glucose-Capillary: 198 mg/dL — ABNORMAL HIGH (ref 70–99)

## 2012-04-14 LAB — PHOSPHORUS
Phosphorus: 1.6 mg/dL — ABNORMAL LOW (ref 2.3–4.6)
Phosphorus: 3.2 mg/dL (ref 2.3–4.6)

## 2012-04-14 LAB — MAGNESIUM: Magnesium: 2.9 mg/dL — ABNORMAL HIGH (ref 1.5–2.5)

## 2012-04-14 MED ORDER — FOLIC ACID 1 MG PO TABS
1.0000 mg | ORAL_TABLET | Freq: Every day | ORAL | Status: DC
  Administered 2012-04-14 – 2012-04-19 (×6): 1 mg
  Filled 2012-04-14 (×7): qty 1

## 2012-04-14 MED ORDER — MIDAZOLAM HCL 2 MG/2ML IJ SOLN
2.0000 mg | INTRAMUSCULAR | Status: DC | PRN
  Administered 2012-04-14: 4 mg via INTRAVENOUS
  Administered 2012-04-14 – 2012-04-15 (×6): 2 mg via INTRAVENOUS
  Administered 2012-04-15: 4 mg via INTRAVENOUS
  Administered 2012-04-15 (×2): 2 mg via INTRAVENOUS
  Administered 2012-04-15: 4 mg via INTRAVENOUS
  Filled 2012-04-14: qty 4
  Filled 2012-04-14 (×2): qty 2
  Filled 2012-04-14: qty 4
  Filled 2012-04-14: qty 2
  Filled 2012-04-14: qty 4
  Filled 2012-04-14 (×3): qty 2
  Filled 2012-04-14: qty 4
  Filled 2012-04-14: qty 2

## 2012-04-14 MED ORDER — MAGNESIUM SULFATE 40 MG/ML IJ SOLN
4.0000 g | Freq: Once | INTRAMUSCULAR | Status: AC
  Administered 2012-04-14: 4 g via INTRAVENOUS
  Filled 2012-04-14: qty 100

## 2012-04-14 MED ORDER — LORAZEPAM 2 MG/ML IJ SOLN: 0.0000 mg | Freq: Four times a day (QID) | INTRAMUSCULAR | Status: DC

## 2012-04-14 MED ORDER — SODIUM CHLORIDE 0.9 % IV SOLN
10.0000 ug/h | INTRAVENOUS | Status: DC
  Administered 2012-04-14: 25 ug/h via INTRAVENOUS
  Administered 2012-04-14: 50 ug/h via INTRAVENOUS
  Filled 2012-04-14: qty 50

## 2012-04-14 MED ORDER — MAGNESIUM SULFATE 40 MG/ML IJ SOLN
4.0000 g | Freq: Once | INTRAMUSCULAR | Status: DC
  Filled 2012-04-14: qty 100

## 2012-04-14 MED ORDER — SODIUM CHLORIDE 0.9 % IV SOLN
INTRAVENOUS | Status: DC
  Administered 2012-04-14 – 2012-04-17 (×3): via INTRAVENOUS
  Administered 2012-04-19: 20 mL/h via INTRAVENOUS

## 2012-04-14 MED ORDER — FENTANYL CITRATE 0.05 MG/ML IJ SOLN
50.0000 ug | INTRAMUSCULAR | Status: DC | PRN
  Administered 2012-04-14: 100 ug via INTRAVENOUS
  Administered 2012-04-14: 50 ug via INTRAVENOUS
  Administered 2012-04-14: 100 ug via INTRAVENOUS
  Administered 2012-04-14: 50 ug via INTRAVENOUS
  Administered 2012-04-14: 100 ug via INTRAVENOUS
  Administered 2012-04-15: 50 ug via INTRAVENOUS
  Administered 2012-04-15: 100 ug via INTRAVENOUS
  Filled 2012-04-14 (×5): qty 2

## 2012-04-14 MED ORDER — LORAZEPAM 2 MG/ML IJ SOLN: 0.0000 mg | Freq: Two times a day (BID) | INTRAMUSCULAR | Status: DC

## 2012-04-14 MED ORDER — LORAZEPAM 2 MG/ML IJ SOLN: 1.0000 mg | Freq: Four times a day (QID) | INTRAMUSCULAR | Status: DC | PRN

## 2012-04-14 MED ORDER — LORAZEPAM 1 MG PO TABS
1.0000 mg | ORAL_TABLET | Freq: Four times a day (QID) | ORAL | Status: DC | PRN
Start: 2012-04-14 — End: 2012-04-15

## 2012-04-14 MED ORDER — PANTOPRAZOLE SODIUM 40 MG PO PACK
40.0000 mg | PACK | Freq: Every day | ORAL | Status: DC
  Administered 2012-04-14 – 2012-04-21 (×7): 40 mg
  Filled 2012-04-14 (×11): qty 20

## 2012-04-14 MED ORDER — VITAMIN B-1 100 MG PO TABS
100.0000 mg | ORAL_TABLET | Freq: Every day | ORAL | Status: DC
  Administered 2012-04-14 – 2012-04-19 (×6): 100 mg
  Filled 2012-04-14 (×7): qty 1

## 2012-04-14 MED ORDER — POTASSIUM PHOSPHATE DIBASIC 3 MMOLE/ML IV SOLN
30.0000 mmol | Freq: Once | INTRAVENOUS | Status: AC
  Administered 2012-04-14: 30 mmol via INTRAVENOUS
  Filled 2012-04-14: qty 10

## 2012-04-14 NOTE — Progress Notes (Signed)
Inpatient Diabetes Program Recommendations  AACE/ADA: New Consensus Statement on Inpatient Glycemic Control (2013)  Target Ranges:  Prepandial:   less than 140 mg/dL      Peak postprandial:   less than 180 mg/dL (1-2 hours)      Critically ill patients:  140 - 180 mg/dL   Reason for Visit: Glucose levels elevating with tube feeds while on solumedrol Pt may need some tube feed coverage, Novolog,starting with 2 units q4hrs As tube feed increases toward 30-35, pt may need 3-4 units q4hrs  Can continue correction scale.  May be helpful to use the ICU hyperglycemia protocol while on solumedrol.   Note: Thank you, Lenor Coffin, RN, CNS, Diabetes Coordinator [phone])

## 2012-04-14 NOTE — Progress Notes (Signed)
eLink Physician-Brief Progress Note Patient Name: DONNIE GEDEON DOB: 26-Sep-1955 MRN: [id number]  Date of Service  04/14/2012   HPI/Events of Note     eICU Interventions   Add Fent gtt + int versed   Intervention Category Minor Interventions: Agitation / anxiety - evaluation and management  ALVA,RAKESH V. 04/14/2012, 9:11 PM

## 2012-04-14 NOTE — Progress Notes (Signed)
haircut  04/14/12 0100  Hygiene  Linen Change Gown changed;Full linen changed;Other (Comment)    04/14/12 0100  Hygiene  Linen Change Gown changed;Full linen changed;Other (Comment)

## 2012-04-14 NOTE — Progress Notes (Signed)
eLink Physician-Brief Progress Note Patient Name: Jennifer Hurley DOB: May 17, 1955 MRN: [id number]  Date of Service  04/14/2012   HPI/Events of Note  Multiple electrolyte disorders with low Mag, phos, and potassium.  Corrected calcium is adequate  eICU Interventions  Plan: Orders for replacement of potassium, phos, and magnesium   Intervention Category Intermediate Interventions: Electrolyte abnormality - evaluation and management  DETERDING,ELIZABETH 04/14/2012, 5:07 AM

## 2012-04-14 NOTE — Progress Notes (Signed)
This patient is receiving IV Protonix, thiamin, and folic acid. Based on criteria approved by the Pharmacy and Therapeutics Committee, this medication is being converted to the equivalent oral dose form. These criteria include:   . The patient is eating (either orally or per tube) and/or has been taking other orally administered medications for at least 24 hours.  . This patient has no evidence of active gastrointestinal bleeding or impaired GI absorption (gastrectomy, short bowel, patient on TNA or NPO).   If you have questions about this conversion, please contact the pharmacy department.  Berkley Harvey, [facility] 04/14/2012 9:36 AM

## 2012-04-14 NOTE — Progress Notes (Signed)
During bath, pt's hair was found to be matted/unkempt and unable to wash/comb due to condition. NT consulted with RN on cutting patient's hair, as hair condition was a medical issue due to it being unable to be washed/cared for appropriately. NT cut pt's hair and washed hair.

## 2012-04-14 NOTE — Progress Notes (Signed)
CRITICAL VALUE ALERT  Critical value received:  K 2.7  Date of notification:  04/14/12  Time of notification:  0458  Critical value read back:yes  Nurse who received alert:  Julien Girt, RN  MD notified (1st page):  Pola Corn MD  Time of first page:  0459  MD notified (2nd page):  Time of second page:  Responding MD:  Deterding, MD  Time MD responded:  [phone]

## 2012-04-14 NOTE — Progress Notes (Signed)
PULMONARY  / CRITICAL CARE MEDICINE  Name: JOLIYAH LIPPENS MRN: [id number] DOB: 1955/03/26    ADMISSION DATE:  04/12/2012 CONSULTATION DATE:  04/12/2012  REFERRING MD :  TRH  CHIEF COMPLAINT:  Acute respiratory failure  BRIEF PATIENT DESCRIPTION:  57 yo with severe COPD, recent cavitary pneumonia and multiple recent hospitalizations for acute respiratory failure in setting of cocaine / alcohol abuse and medical noncompliance admitted 3/4 with acute respiratory failure, failed BiPAP, intubated.  SIGNIFICANT EVENTS / STUDIES:  3/4  Admitted for acute respiratory failure, failed BiPAP, intubated, CVL placed.  LINES / TUBES: OETT 3/4 >>> OGT 3/4 >>> Foley 3/4 >>> L [place] CVL 3/4 >>> 3/4 rt rad a line>>  CULTURES: 3/4  Blood >>> 3/4  Respiratory >>>  ANTIBIOTICS: Levaquin 3/4 >>>  VITAL SIGNS: Temp:  [97.3 F (36.3 C)-99.3 F (37.4 C)] 97.3 F (36.3 C) (03/06 0400) Pulse Rate:  [58-94] 71 (03/06 0800) Resp:  [15-16] 16 (03/06 0800) BP: (77-132)/(51-87) 116/71 mmHg (03/06 0800) SpO2:  [93 %-100 %] 97 % (03/06 0800) Arterial Line BP: (77-152)/(46-85) 118/65 mmHg (03/06 0800) FiO2 (%):  [30 %] 30 % (03/06 0400) HEMODYNAMICS: CVP:  [6 mmHg-17 mmHg] 17 mmHg VENTILATOR SETTINGS: Vent Mode:  [-] PRVC FiO2 (%):  [30 %] 30 % Set Rate:  [16 bmp] 16 bmp Vt Set:  [400 mL] 400 mL PEEP:  [5 cmH20] 5 cmH20 Plateau Pressure:  [14 cmH20-17 cmH20] 17 cmH20 INTAKE / OUTPUT: Intake/Output     03/05 0701 - 03/06 0700 03/06 0701 - 03/07 0700   I.V. (mL/kg) 4607.1 (125.9) 198.6 (5.4)   NG/GT 1170 170   IV Piggyback 250    Total Intake(mL/kg) 6027.1 (164.7) 368.6 (10.1)   Urine (mL/kg/hr) 3023 (3.4) 245 (3.5)   Total Output 3023 245   Net +3004.1 +123.6        PHYSICAL EXAMINATION: General:  Appears chronically ill, cachectic, older than stated age, mechanically ventilated, synchronous Neuro:  Arousable, nonfocal, cough/gag intact HEENT:  PERRL, OETT/OGT, -thyromegally and -  JVD. Cardiovascular:  Tachycardic, regular, no murmurs.  Lungs:  Bilateral diminished air entry, few expiratory wheezes. Abdomen:  Soft, nontender, bowel sounds diminished. Musculoskeletal:  Moves all extremities, no edema. Skin:  Intact.  LABS:  Recent Labs Lab 04/12/12 1255  04/12/12 1938 04/12/12 2020 04/12/12 2238 04/13/12 0135 04/13/12 0425 04/13/12 1108 04/13/12 1145 04/14/12 0329 04/14/12 0425  HGB 13.0  --   --   --   --   --  10.5*  --   --   --  9.9*  WBC 5.8  --   --   --   --   --  5.9  --   --   --  4.3  PLT 220  --   --   --   --   --  179  --   --   --  138*  NA 125*  --   --   --   --   --  132*  --  134*  --  137  K 4.1  --   --   --   --   --  2.9*  --  3.6  --  2.7*  CL 87*  --   --   --   --   --  102  --  107  --  110  CO2 21  --   --   --   --   --  18*  --  16*  --  19  GLUCOSE 102*  --   --   --   --   --  290*  --  171*  --  186*  BUN 32*  --   --   --   --   --  18  --  14  --  12  CREATININE 1.41*  --   --   --   --   --  0.67  --  0.60  --  0.55  CALCIUM 8.8  --   --   --   --   --  6.9*  --  6.9*  --  6.9*  MG 2.4  --   --   --   --   --  1.6  --   --   --  1.5  PHOS  --   --   --   --   --   --  2.3  --   --   --  1.6*  AST 263*  --   --   --   --   --   --   --   --   --   --   ALT 58*  --   --   --   --   --   --   --   --   --   --   ALKPHOS 97  --   --   --   --   --   --   --   --   --   --   BILITOT 0.5  --   --   --   --   --   --   --   --   --   --   PROT 8.1  --   --   --   --   --   --   --   --   --   --   ALBUMIN 3.3*  --   --   --   --   --   --   --   --   --   --   APTT  --   --   --   --   --   --   --   --   --   --  49*  INR  --   --   --   --   --   --   --   --   --   --  0.98  LATICACIDVEN 2.1  --   --  1.0  --  1.2  --   --  1.8  --   --   TROPONINI <0.30  --   --  <0.30  --  <0.30  --   --  <0.30  --   --   PROCALCITON  --   --  2.76  --   --   --  1.50  --   --   --  0.54  PROBNP 1485.0*  --   --   --   --   --   --    --   --   --   --   PHART  --   < >  --   --  7.214*  --   --  7.235*  --  7.288*  --   PCO2ART  --   < >  --   --  42.7  --   --  41.3  --  39.4  --   PO2ART  --   < >  --   --  212.0*  --   --  83.1  --  80.3  --   < > = values in this interval not displayed.  Recent Labs Lab 04/13/12 1601 04/13/12 1935 04/13/12 2347 04/14/12 0255 04/14/12 0758  GLUCAP 152* 152* 136* 170* 202*   .imaging: Dg Chest 1 View  04/12/2012  *RADIOLOGY REPORT*  Clinical Data: 57 year old female intubated and central line placement.  CHEST - 1 VIEW  Comparison: 1258 hours the same day and earlier.  Findings: Portable semi upright AP views of the chest at 2033 hours.  Endotracheal tube tip in place, projects about halfway between the level of clavicles and carina.  Left subclavian approach central line in place, tip at the cavoatrial junction level.  Enteric tube placed, courses to the left upper quadrant with tip not included.  Stable large lung volumes and patchy bilateral reticulonodular density.  Evidence of left upper lobe emphysema.  No pneumothorax, pleural effusion or pulmonary edema identified.  Stable cardiac size and mediastinal contours.  IMPRESSION: 1.  Intubated with endotracheal tube and enteric tube in good position. 2.  Left subclavian central line placed, tip at the cavoatrial junction level. 3.  Stable lungs.  No pneumothorax.   Original Report Authenticated By: Erskine Speed, M.D.    US Renal  04/12/2012  *RADIOLOGY REPORT*  Clinical Data: History of acute renal injury.  History of dehydration.  RENAL/URINARY TRACT ULTRASOUND COMPLETE  Comparison:  None.  Findings:  Right Kidney:  Right renal length is 9.8 cm.  There is a small amount of urine in the collecting system of the right kidney with mild pyelectasis. No definite obstructive hydronephrosis is seen. There is increased echogenicity of the cortex with prominent pyramids.  Left Kidney:  Left renal length is 10.5 cm. There is a small amount of urine  in the collecting system of the left kidney with mild pyelectasis. There is increased echogenicity of cortex with prominent pyramids.  Examination of each kidney showed no evidence of diffuse or focal parenchymal loss, calculus, cyst, or solid mass.  Bladder:  Urinary bladder was empty with catheter in place.  IMPRESSION: Kidneys are normal size. There is a small amount of urine in the collecting system of each  kidney with mild pyelectasis. No definite obstructive hydronephrosis is seen.  There is increased echogenicity of the cortex with prominent pyramids. History given of dehydration.   Original Report Authenticated By: Onalee Hua Call    Dg Chest Port 1 View  04/14/2012  *RADIOLOGY REPORT*  Clinical Data: Acute respiratory failure on ventilator.  COPD  PORTABLE CHEST - 1 VIEW  Comparison: 04/13/2012  Findings: The endotracheal tube, left subclavian center venous catheter and nasogastric tube remain in appropriate position.  Bilateral diffuse air space disease with bibasilar predominance is again seen superimposed on pulmonary emphysema.  This shows mild worsening in the right lung base, otherwise there is been no significant interval change.  No pleural effusion or pneumothorax identified.  Heart size remains normal.  IMPRESSION:  1.  Support apparatus in appropriate position. 2.  Bilateral airspace disease superimposed on pulmonary emphysema. 3.  Mild worsening of airspace disease noted in the right lung base.  No other significant interval change.   Original Report Authenticated By: Myles Rosenthal, M.D.    Dg Chest Port 1 View  04/13/2012  *RADIOLOGY REPORT*  Clinical Data: Status post intubation  PORTABLE  CHEST - 1 VIEW  Comparison: 04/12/2012  Findings: There is an ET tube with 2.5 cm above the carina.  Nasogastric tube tip is in the stomach.  There is a left subclavian catheter with tip in the cavoatrial junction.  The lungs are hyperinflated and there are coarsened interstitial markings noted bilaterally.   Patchy airspace opacification within the left lower lobe and right upper lobe is similar to previous exam.  IMPRESSION:  1.  Stable position of endotracheal tube with tip above the carina. 2.  No change in aeration to the lungs.   Original Report Authenticated By: Signa Kell, M.D.    Dg Chest Port 1 View  04/12/2012  *RADIOLOGY REPORT*  Clinical Data: Weakness and shortness of breath.  PORTABLE CHEST - 1 VIEW  Comparison: 02/16/2012  Findings: Right upper lobe infiltrate has resolved since the prior study.  Severe chronic lung disease again noted without definite focal acute process.  The heart size is normal.  IMPRESSION: Resolution of previously identified right upper lobe infiltrate. Stable severe COPD.   Original Report Authenticated By: Irish Lack, M.D.    ASSESSMENT / PLAN:  PULMONARY A:  Acute on chronic respiratory failure.  COPD with exacerbation.  Resolving cavitary RUL pneumonia, no new infiltrates.  History of recent negative PPD and equivocal quantiferon gold - doubt acute TB given improving infiltrates. P:   - Goal SpO2>92, pH>7.30 - Begin PS trials today but no extubation until mental status and hemodynamics are improved. - Xopenex, Atrovent, Pulmicort. - Solu-Medrol as ordered. - Will repeat quantiferon gold test today, will not bronch, this is very unlikely to be TB since patient is responding nicely to anti-bacterials.  CARDIOVASCULAR A: Chronic systolic CHF without evidence of exacerbation.  Possible core pulmonale.  Hypotension, likely secondary to hypovolemia.  Possible SIRS.  CAD.  CVP 17. P:  - Goal MAP>65, coming down on neo nicely as sedation is decreased. - 2D echo EF of 55% and PAP of 37 mmHg. - Neo-Synephrine at 50 mcg and decreasing. - ASA. - CVP q12h, last is 17 will stop IVF. - Replace Mg, K and Phos.  RENAL A:  Hyponatremia, chronic.  Suspected SIADH.  AKI.  Hypovolemia. P:   Goal CVP>10. Trend BMP. Demeclocycline (as preadmission), restarted at  150 q12 hours 3/5. CVP 17. KVO IVF.  GASTROINTESTINAL A:  Severe malnutrition.  Pulmonary cachexia.  Adult failure to thrive.  Elevated transaminases in setting of ETOH.  At risk for refeeding syndrome. P:   TF started 3/5. Nutritionist consult appreciated, TF tolerated. Protonix for GI Px Acute hepatitis panel with B surface ab positive but ag negative.  HEMATOLOGIC A:  Platelets dropping. P:  Trend CBC D/C heparin for DVT prophylaxis and start SCD. HIT panel.  INFECTIOUS A:  Resolving pneumonia.  Doubt TB.  Empirical coverage for COPD exacerbation. P:   Cultures and antibiotics as above PCT  ENDOCRINE  A:  Hypothermia on admission, now rewarmed.  Equivocal Cortisol level previously.  Possible adrenal insufficiency. P:   No interventions required while on solumedrol, once off will need stress dose steroids and home dose after.  NEUROLOGIC A:  Acute encephalopathy, multifactorial.  History of ETOH / cocaine abuse, but clean drug screen on admission.  At risk for withdrawal.  Previous Psychiatric evaluations and questions of decision making capacity.  P:   Goal RASS 0 to -1 Change versed/fentanyl to PRN. CIWA. Thiamine / Folate.  CC time 35 min.  Alyson Reedy, M.D. [facility] Pulmonary/Critical Care Medicine. Pager:  [phone]. After hours pager: [phone].

## 2012-04-15 ENCOUNTER — Inpatient Hospital Stay (HOSPITAL_COMMUNITY): Payer: Medicare Other

## 2012-04-15 LAB — GLUCOSE, CAPILLARY
Glucose-Capillary: 121 mg/dL — ABNORMAL HIGH (ref 70–99)
Glucose-Capillary: 116 mg/dL — ABNORMAL HIGH (ref 70–99)
Glucose-Capillary: 120 mg/dL — ABNORMAL HIGH (ref 70–99)

## 2012-04-15 LAB — BASIC METABOLIC PANEL
CO2: 25 mEq/L (ref 19–32)
Chloride: 106 mEq/L (ref 96–112)
Sodium: 138 mEq/L (ref 135–145)

## 2012-04-15 LAB — BLOOD GAS, ARTERIAL
Acid-base deficit: 0.4 mmol/L (ref 0.0–2.0)
Bicarbonate: 23.7 mEq/L (ref 20.0–24.0)
Bicarbonate: 25.3 mEq/L — ABNORMAL HIGH (ref 20.0–24.0)
FIO2: 0.3 %
FIO2: 0.3 %
O2 Saturation: 95.5 %
O2 Saturation: 96.1 %
Patient temperature: 98.7
Patient temperature: 98.6
TCO2: 22.8 mmol/L (ref 0–100)
TCO2: 24.2 mmol/L (ref 0–100)
pH, Arterial: 7.324 — ABNORMAL LOW (ref 7.350–7.450)

## 2012-04-15 LAB — CBC
MCV: 81.4 fL (ref 78.0–100.0)
Platelets: 119 10*3/uL — ABNORMAL LOW (ref 150–400)
RBC: 3.28 MIL/uL — ABNORMAL LOW (ref 3.87–5.11)
WBC: 3.1 10*3/uL — ABNORMAL LOW (ref 4.0–10.5)

## 2012-04-15 LAB — CULTURE, RESPIRATORY W GRAM STAIN

## 2012-04-15 LAB — PHOSPHORUS: Phosphorus: 1.9 mg/dL — ABNORMAL LOW (ref 2.3–4.6)

## 2012-04-15 LAB — MAGNESIUM: Magnesium: 2.3 mg/dL (ref 1.5–2.5)

## 2012-04-15 MED ORDER — LEVOFLOXACIN IN D5W 750 MG/150ML IV SOLN
750.0000 mg | INTRAVENOUS | Status: DC
  Administered 2012-04-15 – 2012-04-18 (×4): 750 mg via INTRAVENOUS
  Filled 2012-04-15 (×4): qty 150

## 2012-04-15 MED ORDER — METHYLPREDNISOLONE SODIUM SUCC 125 MG IJ SOLR
80.0000 mg | Freq: Two times a day (BID) | INTRAMUSCULAR | Status: DC
  Administered 2012-04-15 – 2012-04-19 (×8): 80 mg via INTRAVENOUS
  Filled 2012-04-15 (×10): qty 1.28

## 2012-04-15 MED ORDER — ALBUTEROL SULFATE (5 MG/ML) 0.5% IN NEBU
2.5000 mg | INHALATION_SOLUTION | Freq: Four times a day (QID) | RESPIRATORY_TRACT | Status: DC
  Administered 2012-04-15 – 2012-04-25 (×41): 2.5 mg via RESPIRATORY_TRACT
  Filled 2012-04-15 (×41): qty 0.5

## 2012-04-15 MED ORDER — FENTANYL BOLUS VIA INFUSION
25.0000 ug | Freq: Four times a day (QID) | INTRAVENOUS | Status: DC | PRN
  Filled 2012-04-15: qty 100

## 2012-04-15 MED ORDER — IPRATROPIUM BROMIDE 0.02 % IN SOLN
0.5000 mg | Freq: Four times a day (QID) | RESPIRATORY_TRACT | Status: DC
  Administered 2012-04-15 – 2012-04-25 (×41): 0.5 mg via RESPIRATORY_TRACT
  Filled 2012-04-15 (×41): qty 2.5

## 2012-04-15 MED ORDER — MIDAZOLAM BOLUS VIA INFUSION
1.0000 mg | INTRAVENOUS | Status: DC | PRN
  Filled 2012-04-15: qty 2

## 2012-04-15 MED ORDER — SODIUM CHLORIDE 0.9 % IV SOLN
1.0000 mg/h | INTRAVENOUS | Status: DC
  Administered 2012-04-15: 2 mg/h via INTRAVENOUS
  Administered 2012-04-15 – 2012-04-18 (×7): 4 mg/h via INTRAVENOUS
  Filled 2012-04-15 (×10): qty 10

## 2012-04-15 MED ORDER — SODIUM CHLORIDE 0.9 % IV SOLN
25.0000 ug/h | INTRAVENOUS | Status: DC
  Administered 2012-04-15: 100 ug/h via INTRAVENOUS
  Administered 2012-04-15: 125 ug/h via INTRAVENOUS
  Administered 2012-04-16: 150 ug/h via INTRAVENOUS
  Administered 2012-04-18: 125 ug/h via INTRAVENOUS
  Administered 2012-04-19: 50 ug/h via INTRAVENOUS
  Filled 2012-04-15 (×5): qty 50

## 2012-04-15 NOTE — Clinical Social Work Note (Signed)
CSW continues to follow. CSW met with son again on 3/6, he was able to get an update from Dr. Bard Herbert. CSW spoke with RN and reviewed chart. CSW will continue to follow for support and multiple psychosocial concerns. No family present today.   [name], LCSW ICU/Stepdown Clinical Social Worker [facility] Cell [phone] Hours 8am-1200pm M-F

## 2012-04-15 NOTE — Progress Notes (Signed)
ANTIBIOTIC CONSULT NOTE - FOLLOW UP  Pharmacy Consult for Levaquin Indication: COPD exacerbation  No Known Allergies  Patient Measurements: Height: 5\' 1"  (154.9 cm) Weight: 90 lb 6.2 oz (41 kg) IBW/kg (Calculated) : 47.8  Vital Signs: Temp: 98.2 F (36.8 C) (03/07 0800) Temp src: Core (Comment) (03/07 0800) BP: 109/64 mmHg (03/07 0810) Pulse Rate: 94 (03/07 0835)  Labs:  Recent Labs  04/12/12 1435 04/13/12 0425  04/14/12 0425 04/14/12 1008 04/15/12 0430  WBC  --  5.9  --  4.3  --  3.1*  HGB  --  10.5*  --  9.9*  --  9.2*  PLT  --  179  --  138*  --  119*  LABCREA 90.32  --   --   --   --   --   CREATININE  --  0.67  < > 0.55 0.51 0.52  < > = values in this interval not displayed. Estimated Creatinine Clearance: 51.4 ml/min (by C-G formula based on Cr of 0.52).   Microbiology: Recent Results (from the past 720 hour(s))  CULTURE, BLOOD (ROUTINE X 2)     Status: None   Collection Time    04/12/12 12:50 PM      Result Value Range Status   Specimen Description BLOOD LEFT FEMORAL   Final   Special Requests BOTTLES DRAWN AEROBIC ONLY   Final   Culture  Setup Time 04/12/2012 21:29   Final   Culture     Final   Value:        BLOOD CULTURE RECEIVED NO GROWTH TO DATE CULTURE WILL BE HELD FOR 5 DAYS BEFORE ISSUING A FINAL NEGATIVE REPORT   Report Status PENDING   Incomplete  CULTURE, BLOOD (ROUTINE X 2)     Status: None   Collection Time    04/12/12  1:20 PM      Result Value Range Status   Specimen Description BLOOD RIGHT FOREARM   Final   Special Requests BOTTLES DRAWN AEROBIC ONLY   Final   Culture  Setup Time 04/12/2012 21:29   Final   Culture     Final   Value:        BLOOD CULTURE RECEIVED NO GROWTH TO DATE CULTURE WILL BE HELD FOR 5 DAYS BEFORE ISSUING A FINAL NEGATIVE REPORT   Report Status PENDING   Incomplete  MRSA PCR SCREENING     Status: None   Collection Time    04/12/12  4:04 PM      Result Value Range Status   MRSA by PCR NEGATIVE  NEGATIVE  Final   Comment:            The GeneXpert MRSA Assay (FDA     approved for NASAL specimens     only), is one component of a     comprehensive MRSA colonization     surveillance program. It is not     intended to diagnose MRSA     infection nor to guide or     monitor treatment for     MRSA infections.  CULTURE, RESPIRATORY (NON-EXPECTORATED)     Status: None   Collection Time    04/12/12  9:24 PM      Result Value Range Status   Specimen Description TRACHEAL ASPIRATE   Final   Special Requests NONE   Final   Gram Stain     Final   Value: FEW WBC PRESENT,BOTH PMN AND MONONUCLEAR     RARE SQUAMOUS  EPITHELIAL CELLS PRESENT     FEW GRAM POSITIVE RODS     RARE GRAM NEGATIVE RODS     RARE GRAM POSITIVE COCCI   Culture Culture reincubated for better growth   Final   Report Status PENDING   Incomplete    Anti-infectives   Start     Dose/Rate Route Frequency Ordered Stop   04/13/12 1130  demeclocycline (DECLOMYCIN) tablet 150 mg     150 mg Oral Every 12 hours 04/13/12 1047     04/12/12 2200  demeclocycline (DECLOMYCIN) tablet 300 mg  Status:  Discontinued     300 mg Oral Every 12 hours 04/12/12 1611 04/12/12 2229   04/12/12 1700  levofloxacin (LEVAQUIN) IVPB 750 mg     750 mg 100 mL/hr over 90 Minutes Intravenous Every 48 hours 04/12/12 1644        Assessment:  57 yof with h/o and COPD on chronic home oxygen presented 3/4 with c/o weakness. Patient with SOB and wheezing - MD starting Levaquin per pharmacy for COPD exacerbation.  Afebrile, WBC low, SCr , Scr stable/improved.  CG CrCl of 51 ml/min.    Blood cultures NGTD, Respiratory cxt in progress:   Goal of Therapy:  Appropriate abx dosing, eradication of infection.   Plan:   Increase to Levaquin 750mg  IV q24h.  Follow up renal function and cultures as available.   Lynann Beaver PharmD, BCPS Pager [phone] 04/15/2012 9:32 AM

## 2012-04-15 NOTE — Progress Notes (Signed)
PULMONARY  / CRITICAL CARE MEDICINE  Name: Jennifer Hurley MRN: [id number] DOB: [birth year]    ADMISSION DATE:  04/12/2012 CONSULTATION DATE:  04/12/2012  REFERRING MD :  TRH  CHIEF COMPLAINT:  Acute respiratory failure  BRIEF PATIENT DESCRIPTION:  57 yo with severe COPD, recent cavitary pneumonia and multiple recent hospitalizations for acute respiratory failure in setting of cocaine / alcohol abuse and medical noncompliance admitted 3/4 by triad with acute respiratory failure, failed BiPAP, intubated in ICU 3/4 pm and PCCM assumed care at that point  SIGNIFICANT EVENTS / STUDIES:  3/6 off pressors 3/7 changed to continuous sedation due to airtrapping    LINES / TUBES: OETT 3/4 >>> OGT 3/4 >>>  L [place] CVL 3/4 >>> R rad a line 3/4 >>  CULTURES: MRSA screen 3/4 > neg 3/4  Blood >>> 3/4  Respiratory > mod group A strep pyogenes >>  ANTIBIOTICS: Levaquin 3/4 >>>  Subjective/ overnight events: Sedated, air stacking badly even with controlled rr of 16  VITAL SIGNS: Temp:  [97.3 F (36.3 C)-98.8 F (37.1 C)] 98.2 F (36.8 C) (03/07 0800) Pulse Rate:  [64-132] 98 (03/07 1000) Resp:  [7-23] 16 (03/07 1000) BP: (80-116)/(51-67) 104/56 mmHg (03/07 0940) SpO2:  [95 %-100 %] 98 % (03/07 1000) Arterial Line BP: (86-141)/(49-85) 101/55 mmHg (03/07 1000) FiO2 (%):  [30 %] 30 % (03/07 1000) Weight:  [90 lb 6.2 oz (41 kg)] 90 lb 6.2 oz (41 kg) (03/07 0400)  HEMODYNAMICS: CVP:  [5 mmHg-23 mmHg] 11 mmHg  VENTILATOR SETTINGS: Vent Mode:  [-] PRVC FiO2 (%):  [30 %] 30 % Set Rate:  [16 bmp] 16 bmp Vt Set:  [400 mL] 400 mL PEEP:  [5 cmH20] 5 cmH20 Plateau Pressure:  [11 cmH20-28 cmH20] 28 cmH20 INTAKE / OUTPUT: Intake/Output     03/06 0701 - 03/07 0700 03/07 0701 - 03/08 0700   P.O. 120    I.V. (mL/kg) 1071.3 (26.1) 138.8 (3.4)   NG/GT 800 105   IV Piggyback 150    Total Intake(mL/kg) 2141.3 (52.2) 243.8 (5.9)   Urine (mL/kg/hr) 1665 (1.7) 235 (1.4)   Total Output 1665 235   Net  +476.3 +8.8        PHYSICAL EXAMINATION: General:  Appears chronically ill, cachectic, older than stated age, mechanically ventilated, synchronous Neuro:  Arousable, nonfocal, cough/gag intact HEENT:  PERRL, OETT/OGT, -thyromegally and - JVD. Cardiovascular:  Tachycardic, regular, no murmurs.  Lungs:  Bilateral diminished air entry, few expiratory wheezes. Abdomen:  Soft, nontender, bowel sounds diminished. Musculoskeletal:  Moves all extremities, no edema. Skin:  Intact.  LABS:  Recent Labs Lab 04/12/12 1255  04/12/12 1938 04/12/12 2020  04/13/12 0135  04/13/12 0425 04/13/12 1108 04/13/12 1145 04/14/12 0329 04/14/12 0425 04/14/12 1008 04/15/12 0430 04/15/12 0433  HGB 13.0  --   --   --   --   --   --  10.5*  --   --   --  9.9*  --  9.2*  --   WBC 5.8  --   --   --   --   --   --  5.9  --   --   --  4.3  --  3.1*  --   PLT 220  --   --   --   --   --   --  179  --   --   --  138*  --  119*  --   NA 125*  --   --   --   --   --   --  132*  --  134*  --  137 134* 138  --   K 4.1  --   --   --   --   --   --  2.9*  --  3.6  --  2.7* 3.4* 3.6  --   CL 87*  --   --   --   --   --   --  102  --  107  --  110 106 106  --   CO2 21  --   --   --   --   --   --  18*  --  16*  --  19 19 25   --   GLUCOSE 102*  --   --   --   --   --   --  290*  --  171*  --  186* 208* 138*  --   BUN 32*  --   --   --   --   --   --  18  --  14  --  12 10 10   --   CREATININE 1.41*  --   --   --   --   --   --  0.67  --  0.60  --  0.55 0.51 0.52  --   CALCIUM 8.8  --   --   --   --   --   --  6.9*  --  6.9*  --  6.9* 6.7* 7.5*  --   MG 2.4  --   --   --   --   --   --  1.6  --   --   --  1.5 2.9* 2.3  --   PHOS  --   --   --   --   --   --   < > 2.3  --   --   --  1.6* 3.2 1.9*  --   AST 263*  --   --   --   --   --   --   --   --   --   --   --   --   --   --   ALT 58*  --   --   --   --   --   --   --   --   --   --   --   --   --   --   ALKPHOS 97  --   --   --   --   --   --   --   --   --   --   --    --   --   --   BILITOT 0.5  --   --   --   --   --   --   --   --   --   --   --   --   --   --   PROT 8.1  --   --   --   --   --   --   --   --   --   --   --   --   --   --   ALBUMIN 3.3*  --   --   --   --   --   --   --   --   --   --   --   --   --   --  APTT  --   --   --   --   --   --   --   --   --   --   --  49*  --   --   --   INR  --   --   --   --   --   --   --   --   --   --   --  0.98  --   --   --   LATICACIDVEN 2.1  --   --  1.0  --  1.2  --   --   --  1.8  --   --   --   --   --   TROPONINI <0.30  --   --  <0.30  --  <0.30  --   --   --  <0.30  --   --   --   --   --   PROCALCITON  --   --  2.76  --   --   --   --  1.50  --   --   --  0.54  --   --   --   PROBNP 1485.0*  --   --   --   --   --   --   --   --   --   --   --   --   --   --   PHART  --   < >  --   --   < >  --   --   --  7.235*  --  7.288*  --   --   --  7.290*  PCO2ART  --   < >  --   --   < >  --   --   --  41.3  --  39.4  --   --   --  50.9*  PO2ART  --   < >  --   --   < >  --   --   --  83.1  --  80.3  --   --   --  78.1*  < > = values in this interval not displayed.  Recent Labs Lab 04/14/12 1644 04/14/12 1930 04/14/12 2334 04/15/12 0504 04/15/12 0801  GLUCAP 114* 122* 120* 121* 116*     pcxr 3/7 Stable support system and basilar air space opacities  ASSESSMENT / PLAN:  PULMONARY A:  Acute on chronic respiratory failure.  COPD with exacerbation.  Resolving cavitary RUL pneumonia, no new infiltrates.  History of recent negative PPD and equivocal quantiferon gold - doubt acute TB given improving infiltrates. P:   - Goal SpO2>92, pH>7.30 - alb/atrovent/pulmocort - Solu-Medrol weaned to 80 q 12h 3/7    CARDIOVASCULAR A: Chronic systolic CHF without evidence of exacerbation.  Possible core pulmonale.  Hypotension, likely secondary to hypovolemia.  Possible SIRS.  CAD.   - euvolemic off pressors 3/7    RENAL A:  Hyponatremia, chronic.  Suspected SIADH.  AKI.  Hypovolemia resolved P:    Demeclocycline (as preadmission), restarted at 150 q12 hours 3/5.    GASTROINTESTINAL A:  Severe malnutrition.  Pulmonary cachexia.  Adult failure to thrive.  Elevated transaminases in setting of ETOH.  At risk for refeeding syndrome. P:   TF started 3/5. Nutritionist consult appreciated, TF tolerated. Protonix for GI Px Acute hepatitis panel with B surface ab positive but ag negative.  HEMATOLOGIC  Lab Results  Component Value Date   PLT 119* 04/15/2012   PLT 138* 04/14/2012   PLT 179 04/13/2012    A:  PThrombocytopenia P:  Trend CBC D/Cd heparin 3/6 for DVT prophylaxis and start SCD. HIT panel 3/6 >>>  INFECTIOUS A:  Resolving pneumonia.  Doubt TB.  Empirical coverage for COPD exacerbation. - Sputum AFB neg culture 02/16/12 P:   Cultures and antibiotics as above    ENDOCRINE  A:  Hypothermia on admission, now rewarmed.  Equivocal Cortisol level previously.  Possible adrenal insufficiency. P:   No interventions required while on solumedrol, once off may need stress dose steroids and home dose after.  NEUROLOGIC A:  Acute encephalopathy, multifactorial.  History of ETOH / cocaine abuse, but clean drug screen on admission.  At risk for withdrawal.  Previous Psychiatric evaluations and questions of decision making capacity.  P:   Goal RASS 0 to -1 Changed to continuous sedation for air trapping 3/7 Thiamine / Folate.  The patient is critically ill with multiple organ systems failure and requires high complexity decision making for assessment and support, frequent evaluation and titration of therapies, application of advanced monitoring technologies and extensive interpretation of multiple databases. Critical Care Time devoted to patient care services described in this note is 45 minutes.     Sandrea Hughs, MD Pulmonary and Critical Care Medicine [facility] Cell [phone] After 5:30 PM or weekends, call [phone]

## 2012-04-15 NOTE — Progress Notes (Signed)
Nutrition Brief Note  Pt continues to be on mechanical ventilation. Tube feeds are at goal rate and pt is tolerating them well per RN and nursing notes.   Ian Malkin RD, LDN Pager: [phone] After Hours Pager: [phone]

## 2012-04-16 ENCOUNTER — Inpatient Hospital Stay (HOSPITAL_COMMUNITY): Payer: Medicare Other

## 2012-04-16 LAB — CBC
Hemoglobin: 9 g/dL — ABNORMAL LOW (ref 12.0–15.0)
MCHC: 33.5 g/dL (ref 30.0–36.0)
RDW: 21.1 % — ABNORMAL HIGH (ref 11.5–15.5)

## 2012-04-16 LAB — BASIC METABOLIC PANEL
GFR calc Af Amer: >90 mL/min (ref >90)
GFR calc non Af Amer: >90 mL/min (ref >90)
Glucose, Bld: 155 mg/dL — ABNORMAL HIGH (ref 70–99)
Potassium: 4.1 mEq/L (ref 3.5–5.1)
Sodium: 146 mEq/L — ABNORMAL HIGH (ref 135–145)

## 2012-04-16 LAB — GLUCOSE, CAPILLARY
Glucose-Capillary: 151 mg/dL — ABNORMAL HIGH (ref 70–99)
Glucose-Capillary: 113 mg/dL — ABNORMAL HIGH (ref 70–99)

## 2012-04-16 LAB — PHOSPHORUS: Phosphorus: 1.8 mg/dL — ABNORMAL LOW (ref 2.3–4.6)

## 2012-04-16 MED ORDER — POTASSIUM PHOSPHATE DIBASIC 3 MMOLE/ML IV SOLN
20.0000 mmol | Freq: Once | INTRAVENOUS | Status: AC
  Administered 2012-04-16: 20 mmol via INTRAVENOUS
  Filled 2012-04-16: qty 6.67

## 2012-04-16 NOTE — Progress Notes (Signed)
Central line leaking fluid; concerning that the lumen is nicked   Attempt periferal iv placement.

## 2012-04-16 NOTE — Progress Notes (Signed)
PULMONARY  / CRITICAL CARE MEDICINE  Name: Jennifer Hurley MRN: [id number] DOB: 1955-02-18    ADMISSION DATE:  04/12/2012 CONSULTATION DATE:  04/12/2012  REFERRING MD :  TRH  CHIEF COMPLAINT:  Acute respiratory failure  BRIEF PATIENT DESCRIPTION:  57 yo with severe COPD, recent cavitary pneumonia and multiple recent hospitalizations for acute respiratory failure in setting of cocaine / alcohol abuse and medical noncompliance admitted 3/4 by triad with acute respiratory failure, failed BiPAP, intubated in ICU 3/4 pm and PCCM assumed care at that point  SIGNIFICANT EVENTS / STUDIES:  3/6 off pressors 3/7 changed to continuous sedation due to airtrapping    LINES / TUBES: OETT 3/4 >>> OGT 3/4 >>>  L [place] CVL 3/4 >>> R rad a line 3/4 >>  CULTURES: MRSA screen 3/4 > neg 3/4  Blood >>> 3/4  Respiratory > mod group A strep pyogenes >>  ANTIBIOTICS: Levaquin 3/4 >>>  Subjective/ overnight events: 04/16/2012: Sedated, air stacking badly even with controlled rr of 16  VITAL SIGNS: Temp:  [97.5 F (36.4 C)-99.3 F (37.4 C)] 99 F (37.2 C) (03/08 0800) Pulse Rate:  [74-112] 112 (03/08 1000) Resp:  [9-15] 13 (03/08 1000) BP: (97-158)/(59-81) 158/81 mmHg (03/08 0800) SpO2:  [96 %-100 %] 100 % (03/08 1000) Arterial Line BP: (96-166)/(52-87) 143/76 mmHg (03/08 1000) FiO2 (%):  [30 %] 30 % (03/08 1225) Weight:  [41.4 kg (91 lb 4.3 oz)] 41.4 kg (91 lb 4.3 oz) (03/08 0500)  HEMODYNAMICS: CVP:  [7 mmHg-12 mmHg] 7 mmHg  VENTILATOR SETTINGS: Vent Mode:  [-] PRVC FiO2 (%):  [30 %] 30 % Set Rate:  [14 bmp] 14 bmp Vt Set:  [430 mL] 430 mL PEEP:  [5 cmH20] 5 cmH20 Plateau Pressure:  [12 cmH20-21 cmH20] 12 cmH20 INTAKE / OUTPUT: Intake/Output     03/07 0701 - 03/08 0700 03/08 0701 - 03/09 0700   P.O. 60    I.V. (mL/kg) 1333.3 (32.2) 189.5 (4.6)   NG/GT 920 215   IV Piggyback 150    Total Intake(mL/kg) 2463.3 (59.5) 404.5 (9.8)   Urine (mL/kg/hr) 1220 (1.2) 150 (0.6)   Total Output  1220 150   Net +1243.3 +254.5        PHYSICAL EXAMINATION: General:  Appears chronically ill, cachectic, older than stated age, mechanically ventilated, synchronous Neuro:  Arousable, nonfocal, cough/gag intact HEENT:  PERRL, OETT/OGT, -thyromegally and - JVD. Cardiovascular:  Tachycardic, regular, no murmurs.  Lungs:  Bilateral diminished air entry, few expiratory wheezes. Abdomen:  Soft, nontender, bowel sounds diminished. Musculoskeletal:  Moves all extremities, no edema. Skin:  Intact.  LABS:  Recent Labs Lab 04/12/12 1255  04/12/12 1938 04/12/12 2020  04/13/12 0135  04/13/12 0425  04/13/12 1145 04/14/12 0329 04/14/12 0425 04/14/12 1008 04/15/12 0430 04/15/12 0433 04/15/12 1230 04/16/12 0454  HGB 13.0  --   --   --   --   --   --  10.5*  --   --   --  9.9*  --  9.2*  --   --  9.0*  WBC 5.8  --   --   --   --   --   --  5.9  --   --   --  4.3  --  3.1*  --   --  3.0*  PLT 220  --   --   --   --   --   --  179  --   --   --  138*  --  119*  --   --  144*  NA 125*  --   --   --   --   --   --  132*  --  134*  --  137 134* 138  --   --  146*  K 4.1  --   --   --   --   --   --  2.9*  --  3.6  --  2.7* 3.4* 3.6  --   --  4.1  CL 87*  --   --   --   --   --   --  102  --  107  --  110 106 106  --   --  112  CO2 21  --   --   --   --   --   --  18*  --  16*  --  19 19 25   --   --  30  GLUCOSE 102*  --   --   --   --   --   --  290*  --  171*  --  186* 208* 138*  --   --  155*  BUN 32*  --   --   --   --   --   --  18  --  14  --  12 10 10   --   --  13  CREATININE 1.41*  --   --   --   --   --   --  0.67  --  0.60  --  0.55 0.51 0.52  --   --  0.53  CALCIUM 8.8  --   --   --   --   --   --  6.9*  --  6.9*  --  6.9* 6.7* 7.5*  --   --  8.0*  MG 2.4  --   --   --   --   --   --  1.6  --   --   --  1.5 2.9* 2.3  --   --  2.1  PHOS  --   --   --   --   --   --   < > 2.3  --   --   --  1.6* 3.2 1.9*  --   --  1.8*  AST 263*  --   --   --   --   --   --   --   --   --   --   --   --    --   --   --   --   ALT 58*  --   --   --   --   --   --   --   --   --   --   --   --   --   --   --   --   ALKPHOS 97  --   --   --   --   --   --   --   --   --   --   --   --   --   --   --   --   BILITOT 0.5  --   --   --   --   --   --   --   --   --   --   --   --   --   --   --   --  PROT 8.1  --   --   --   --   --   --   --   --   --   --   --   --   --   --   --   --   ALBUMIN 3.3*  --   --   --   --   --   --   --   --   --   --   --   --   --   --   --   --   APTT  --   --   --   --   --   --   --   --   --   --   --  49*  --   --   --   --   --   INR  --   --   --   --   --   --   --   --   --   --   --  0.98  --   --   --   --   --   LATICACIDVEN 2.1  --   --  1.0  --  1.2  --   --   --  1.8  --   --   --   --   --   --   --   TROPONINI <0.30  --   --  <0.30  --  <0.30  --   --   --  <0.30  --   --   --   --   --   --   --   PROCALCITON  --   --  2.76  --   --   --   --  1.50  --   --   --  0.54  --   --   --   --   --   PROBNP 1485.0*  --   --   --   --   --   --   --   --   --   --   --   --   --   --   --   --   PHART  --   < >  --   --   < >  --   --   --   < >  --  7.288*  --   --   --  7.290* 7.324*  --   PCO2ART  --   < >  --   --   < >  --   --   --   < >  --  39.4  --   --   --  50.9* 50.2*  --   PO2ART  --   < >  --   --   < >  --   --   --   < >  --  80.3  --   --   --  78.1* 73.4*  --   < > = values in this interval not displayed.  Recent Labs Lab 04/15/12 2001 04/16/12 0029 04/16/12 0411 04/16/12 0734 04/16/12 1153  GLUCAP 120* 140* 151* 109* 146*     pcxr 3/7 Stable support system and basilar air space opacities  ASSESSMENT / PLAN:  PULMONARY  Recent Labs Lab 04/12/12 2238 04/13/12 1108 04/14/12 0329 04/15/12 0433 04/15/12 1230  PHART 7.214*  7.235* 7.288* 7.290* 7.324*  PCO2ART 42.7 41.3 39.4 50.9* 50.2*  PO2ART 212.0* 83.1 80.3 78.1* 73.4*  HCO3 16.9* 16.8* 18.5* 23.7 25.3*  TCO2 16.3 16.1 17.6 22.8 24.2  O2SAT 99.7 95.7 96.5 95.5 96.1     A:  Acute on chronic respiratory failure.  COPD with exacerbation.  Resolving cavitary RUL pneumonia, no new infiltrates.  History of recent negative PPD and equivocal quantiferon gold - doubt acute TB given improving infiltrates. - 04/16/2012: Does not meet SBT or extubation criteria  P:   Full mechanical ventilation support - Goal SpO2>92, pH>7.30 - alb/atrovent/pulmocort - Solu-Medrol weaned to 80 q 12h 3/7    CARDIOVASCULAR A: Chronic systolic CHF without evidence of exacerbation.  Possible core pulmonale.  Hypotension, likely secondary to hypovolemia.  Possible SIRS.  CAD.   - euvolemic off pressors 3/7 - 04/16/2012: Not on pressors P Monitor    RENAL  Recent Labs Lab 04/13/12 0425 04/13/12 1145 04/14/12 0425 04/14/12 1008 04/15/12 0430 04/16/12 0454  NA 132* 134* 137 134* 138 146*  K 2.9* 3.6 2.7* 3.4* 3.6 4.1  CL 102 107 110 106 106 112  CO2 18* 16* 19 19 25 30   GLUCOSE 290* 171* 186* 208* 138* 155*  BUN 18 14 12 10 10 13   CREATININE 0.67 0.60 0.55 0.51 0.52 0.53  CALCIUM 6.9* 6.9* 6.9* 6.7* 7.5* 8.0*  MG 1.6  --  1.5 2.9* 2.3 2.1  PHOS 2.3  --  1.6* 3.2 1.9* 1.8*    A:  Hyponatremia, chronic.  Suspected SIADH.  AKI.  Hypovolemia resolved - 04/16/2012: Hypophosphatemia present  P:   Demeclocycline (as preadmission), restarted at 150 q12 hours 3/5. Potassium phosphate repletion    GASTROINTESTINAL A:  Severe malnutrition.  Pulmonary cachexia.  Adult failure to thrive.  Elevated transaminases in setting of ETOH.  At risk for refeeding syndrome. - 04/16/2012: Some amount of refeeding syndrome as evidenced by hypophosphatemia P:   TF started 3/5. Nutritionist consult appreciated, TF tolerated. Protonix for GI Px Acute hepatitis panel with B surface ab positive but ag negative.  HEMATOLOGIC  Recent Labs Lab 04/14/12 0425 04/15/12 0430 04/16/12 0454  HGB 9.9* 9.2* 9.0*  HCT 28.0* 26.7* 26.9*  WBC 4.3 3.1* 3.0*  PLT 138* 119* 144*    Recent  Labs Lab 04/14/12 0425  INR 0.98     A:  Thrombocytopenia with hit panel pending 04/14/2012 Anemia critical illness Leukopenia P:  PRBC for hemoglobin less than 7 g percent SCD while awaitingHIT panel 3/6 >>>  INFECTIOUS A:  Resolving pneumonia.  Doubt TB.  Empirical coverage for COPD exacerbation. - Sputum AFB neg culture 02/16/12 P:   Cultures and antibiotics as above    ENDOCRINE  A:  Hypothermia on admission, now rewarmed.  Equivocal Cortisol level previously.  Possible adrenal insufficiency. P:   No interventions required while on solumedrol, once off may need stress dose steroids and home dose after.  NEUROLOGIC A:  Acute encephalopathy, multifactorial.  History of ETOH / cocaine abuse, but clean drug screen on admission.  At risk for withdrawal.  Previous Psychiatric evaluations and questions of decision making capacity.  - 04/16/2012: Agitated on wake up assessment P:   Goal RASS 0 to -1 Changed to continuous sedation for air trapping 3/7 Thiamine / Folate.  The patient is critically ill with multiple organ systems failure and requires high complexity decision making for assessment and support, frequent evaluation and titration of therapies, application of advanced monitoring technologies and extensive interpretation of  multiple databases. Critical Care Time devoted to patient care services described in this note is 35 minutes.    Dr. Kalman Shan, M.D., [facility].C.P Pulmonary and Critical Care Medicine Staff Physician [facility] Pager: [phone], If no answer or between  15:00h - 7:00h: call [phone]  04/16/2012 12:47 PM

## 2012-04-17 LAB — CBC
HCT: 25.4 % — ABNORMAL LOW (ref 36.0–46.0)
MCHC: 33.1 g/dL (ref 30.0–36.0)
MCV: 83.3 fL (ref 78.0–100.0)
RDW: 21.7 % — ABNORMAL HIGH (ref 11.5–15.5)

## 2012-04-17 LAB — BASIC METABOLIC PANEL
BUN: 14 mg/dL (ref 6–23)
Creatinine, Ser: 0.44 mg/dL — ABNORMAL LOW (ref 0.50–1.10)
GFR calc Af Amer: >90 mL/min (ref >90)
GFR calc non Af Amer: >90 mL/min (ref >90)

## 2012-04-17 LAB — GLUCOSE, CAPILLARY
Glucose-Capillary: 130 mg/dL — ABNORMAL HIGH (ref 70–99)
Glucose-Capillary: 134 mg/dL — ABNORMAL HIGH (ref 70–99)

## 2012-04-17 MED ORDER — BISACODYL 10 MG RE SUPP
10.0000 mg | Freq: Every day | RECTAL | Status: DC | PRN
  Administered 2012-04-17: 10 mg via RECTAL
  Filled 2012-04-17: qty 1

## 2012-04-17 MED ORDER — POTASSIUM PHOSPHATE DIBASIC 3 MMOLE/ML IV SOLN
10.0000 mmol | Freq: Once | INTRAVENOUS | Status: AC
  Administered 2012-04-17: 10 mmol via INTRAVENOUS
  Filled 2012-04-17: qty 3.33

## 2012-04-17 MED ORDER — DOCUSATE SODIUM 50 MG/5ML PO LIQD
100.0000 mg | Freq: Two times a day (BID) | ORAL | Status: DC
  Administered 2012-04-17 – 2012-04-25 (×15): 100 mg
  Filled 2012-04-17 (×17): qty 10

## 2012-04-17 MED ORDER — MAGNESIUM SULFATE 40 MG/ML IJ SOLN
2.0000 g | Freq: Once | INTRAMUSCULAR | Status: AC
  Administered 2012-04-17: 2 g via INTRAVENOUS
  Filled 2012-04-17: qty 50

## 2012-04-17 MED ORDER — MAGNESIUM SULFATE 50 % IJ SOLN: 2.0000 g | Freq: Once | INTRAVENOUS | Status: DC

## 2012-04-17 NOTE — Progress Notes (Signed)
PULMONARY  / CRITICAL CARE MEDICINE  Name: Jennifer Hurley MRN: [id number] DOB: 09-02-1955    ADMISSION DATE:  04/12/2012 CONSULTATION DATE:  04/12/2012  REFERRING MD :  TRH  CHIEF COMPLAINT:  Acute respiratory failure  BRIEF PATIENT DESCRIPTION:  57 yo with severe COPD, recent cavitary pneumonia and multiple recent hospitalizations for acute respiratory failure in setting of cocaine / alcohol abuse and medical noncompliance admitted 3/4 by [facility] with acute respiratory failure, failed BiPAP, intubated in ICU 3/4 pm and PCCM assumed care at that point g    LINES / TUBES: OETT 3/4 >>> OGT 3/4 >>>  L [place] CVL 3/4 >>> 04/17/12 after leak around site R rad a line 3/4 >>  CULTURES: MRSA screen 3/4 > neg 3/4  Blood >>> 3/4  Respiratory > mod group A strep pyogenes >>  ANTIBIOTICS: Levaquin 3/4 >>>  SIGNIFICANT EVENTS / STUDIES:  3/6 off pressors 3/7 changed to continuous sedation due to airtrappin 04/16/2012: Sedated, air stacking badly even with controlled rr of 16    SUBJECTIVE/OVERNIGHT/INTERVAL HX 04/17/2012: Significant cough tachycardia and hypertension on wake up assessment. So spontaneous breathing trial could not be done. His blood leaking around the central line so this will be removed  VITAL SIGNS: Temp:  [98.4 F (36.9 C)-98.9 F (37.2 C)] 98.4 F (36.9 C) (03/09 0412) Pulse Rate:  [80-153] 126 (03/09 1200) Resp:  [12-15] 14 (03/09 1200) BP: (122-134)/(70-82) 134/76 mmHg (03/09 0004) SpO2:  [92 %-100 %] 94 % (03/09 1200) Arterial Line BP: (110-169)/(58-98) 140/80 mmHg (03/09 1200) FiO2 (%):  [30 %] 30 % (03/09 1219) Weight:  [43.3 kg (95 lb 7.4 oz)] 43.3 kg (95 lb 7.4 oz) (03/09 0500)  HEMODYNAMICS: CVP:  [11 mmHg] 11 mmHg  VENTILATOR SETTINGS: Vent Mode:  [-] PRVC FiO2 (%):  [30 %] 30 % Set Rate:  [14 bmp] 14 bmp Vt Set:  [430 mL] 430 mL PEEP:  [5 cmH20] 5 cmH20 Plateau Pressure:  [15 cmH20-26 cmH20] 15 cmH20 INTAKE / OUTPUT: Intake/Output     03/08 0701  - 03/09 0700 03/09 0701 - 03/10 0700   P.O.     I.V. (mL/kg) 1179.1 (27.2) 187 (4.3)   NG/GT 1095 205   IV Piggyback 744    Total Intake(mL/kg) 3018.1 (69.7) 392 (9.1)   Urine (mL/kg/hr) 1125 (1.1) 500 (1.7)   Total Output 1125 500   Net +1893.1 -108        PHYSICAL EXAMINATION: General:  Appears chronically ill, cachectic, older than stated age, mechanically ventilated Neuro: Agitated on wake up assessment with significant cough  HEENT:  PERRL, OETT/OGT, -thyromegally and - JVD. Cardiovascular:  Tachycardic, regular, no murmurs.  Lungs:  Bilateral diminished air entry, few expiratory wheezes. Abdomen:  Soft, nontender, bowel sounds diminished. Musculoskeletal:  Moves all extremities, no edema. Skin:  Intact.  LABS: See individual assessment and plan    Imaging x48 hours Dg Chest Port 1 View  04/16/2012  *RADIOLOGY REPORT*  Clinical Data: Central line placement.  PORTABLE CHEST - 1 VIEW  Comparison: Chest radiograph performed 04/15/2012  Findings: The patient's endotracheal tube is seen ending 4 cm above the carina.  A left subclavian line is noted ending at the distal SVC.  An enteric tube is noted extending below the diaphragm.  The lungs are well expanded.  Bibasilar airspace opacities are perhaps mildly worsened, concerning for worsening multifocal pneumonia.  Minimal upper lung zone opacities are also seen.  Small bilateral pleural effusions are likely present.  No pneumothorax is  identified.  The cardiomediastinal silhouette is normal in size.  No acute osseous abnormalities are seen.  A metallic ring overlying the gastroesophageal junction is outside the patient, per clinical correlation.  IMPRESSION:  1.  Endotracheal tube seen ending 4 cm above the carina. 2.  Left subclavian line still seen ending at the distal SVC. 3.  Mildly worsened bibasilar airspace opacities concerning for mildly worsened multifocal pneumonia.  Likely small bilateral pleural effusions.   Original Report  Authenticated By: Tonia Ghent, M.D.     ASSESSMENT / PLAN:  PULMONARY  Recent Labs Lab 04/12/12 2238 04/13/12 1108 04/14/12 0329 04/15/12 0433 04/15/12 1230  PHART 7.214* 7.235* 7.288* 7.290* 7.324*  PCO2ART 42.7 41.3 39.4 50.9* 50.2*  PO2ART 212.0* 83.1 80.3 78.1* 73.4*  HCO3 16.9* 16.8* 18.5* 23.7 25.3*  TCO2 16.3 16.1 17.6 22.8 24.2  O2SAT 99.7 95.7 96.5 95.5 96.1    A:  Acute on chronic respiratory failure.  COPD with exacerbation.  Resolving cavitary RUL pneumonia, no new infiltrates.  History of recent negative PPD and equivocal quantiferon gold - doubt acute TB given improving infiltrates.  - 04/17/2012: Does not meet SBT or extubation criteria  P:   Full mechanical ventilation support - Goal SpO2>92, pH>7.30 - alb/atrovent/pulmocort - Solu-Medrol ( weaned to 80 q 12h 3/7)    CARDIOVASCULAR A: Chronic systolic CHF without evidence of exacerbation.  Possible core pulmonale.  Hypotension, likely secondary to hypovolemia.  Possible SIRS.  CAD.   - euvolemic off pressors 3/7 - 04/16/2012  And 04/17/2012: Not on pressors P Monitor Mean arterial pressure goal greater than 60    RENAL  Recent Labs Lab 04/14/12 0425 04/14/12 1008 04/15/12 0430 04/16/12 0454 04/17/12 0535  NA 137 134* 138 146* 145  K 2.7* 3.4* 3.6 4.1 3.7  CL 110 106 106 112 109  CO2 19 19 25 30  32  GLUCOSE 186* 208* 138* 155* 141*  BUN 12 10 10 13 14   CREATININE 0.55 0.51 0.52 0.53 0.44*  CALCIUM 6.9* 6.7* 7.5* 8.0* 7.9*  MG 1.5 2.9* 2.3 2.1 1.7  PHOS 1.6* 3.2 1.9* 1.8* 2.2*    A:  Hyponatremia, chronic.  Suspected SIADH.  AKI.  Hypovolemia resolved -04/17/2012: Mild hypomagnesemia hypophosphatemia and hypokalemia    P:   Demeclocycline (as preadmission), restarted at 150 q12 hours 3/5. Potassium phosphate. Magnesium and potassium repletion    GASTROINTESTINAL  Recent Labs Lab 04/12/12 1255 04/14/12 0425  AST 263*  --   ALT 58*  --   ALKPHOS 97  --   BILITOT 0.5  --    PROT 8.1  --   ALBUMIN 3.3*  --   INR  --  0.98    A:  Severe malnutrition.  Pulmonary cachexia.  Adult failure to thrive.  Elevated transaminases in setting of ETOH.  At risk for refeeding syndrome.Acute hepatitis panel with B surface ab positive but ag negative. - 04/16/2012: Some amount of refeeding syndrome as evidenced by hypophosphatemia  P:   TF started 3/5. Nutritionist consult appreciated, TF tolerated. Protonix for GI Px   HEMATOLOGIC  Recent Labs Lab 04/15/12 0430 04/16/12 0454 04/17/12 0535  HGB 9.2* 9.0* 8.4*  HCT 26.7* 26.9* 25.4*  WBC 3.1* 3.0* 4.4  PLT 119* 144* 159    Recent Labs Lab 04/14/12 0425  INR 0.98     A:  Thrombocytopenia with hit panel pending 04/14/2012 Anemia critical illness Leukopenia P:  PRBC for hemoglobin less than 7 g percent SCD while awaitingHIT panel 3/6 >>>  INFECTIOUS  Recent Labs Lab 04/12/12 1938 04/12/12 2020 04/13/12 0135 04/13/12 0425 04/13/12 1145 04/14/12 0425  LATICACIDVEN  --  1.0 1.2  --  1.8  --   PROCALCITON 2.76  --   --  1.50  --  0.54    A:  Resolving pneumonia.  Doubt TB.  Empirical coverage for COPD exacerbation. - Sputum AFB neg culture 02/16/12 P:   Cultures and antibiotics as above    ENDOCRINE  A:  Hypothermia on admission, now rewarmed.  Equivocal Cortisol level previously.  Possible adrenal insufficiency. P:   No interventions required while on solumedrol, once off may need stress dose steroids and home dose after.  NEUROLOGIC A:  Acute encephalopathy, multifactorial.  History of ETOH / cocaine abuse, but clean drug screen on admission.  At risk for withdrawal.  Previous Psychiatric evaluations and questions of decision making capacity.  - 04/17/2012: Agitated on wake up assessment P:   Goal RASS 0 to -1 Changed to continuous sedation for air trapping 3/7 Thiamine / Folate.  Global  04/17/2012: No family at bedside  The patient is critically ill with multiple organ systems  failure and requires high complexity decision making for assessment and support, frequent evaluation and titration of therapies, application of advanced monitoring technologies and extensive interpretation of multiple databases. Critical Care Time devoted to patient care services described in this note is 35 minutes.    Dr. Kalman Shan, M.D., [facility].C.P Pulmonary and Critical Care Medicine Staff Physician [facility] Pager: [phone], If no answer or between  15:00h - 7:00h: call [phone]  04/17/2012 1:42 PM

## 2012-04-18 ENCOUNTER — Inpatient Hospital Stay (HOSPITAL_COMMUNITY): Payer: Medicare Other

## 2012-04-18 DIAGNOSIS — R404 Transient alteration of awareness: Secondary | ICD-10-CM

## 2012-04-18 DIAGNOSIS — R410 Disorientation, unspecified: Secondary | ICD-10-CM | POA: Diagnosis present

## 2012-04-18 LAB — CBC
MCH: 27.6 pg (ref 26.0–34.0)
MCHC: 33.6 g/dL (ref 30.0–36.0)
MCV: 82.6 fL (ref 78.0–100.0)
MCV: 82.4 fL (ref 78.0–100.0)
Platelets: 215 10*3/uL (ref 150–400)
Platelets: 206 10*3/uL (ref 150–400)
RBC: 3.33 MIL/uL — ABNORMAL LOW (ref 3.87–5.11)
RDW: 21.1 % — ABNORMAL HIGH (ref 11.5–15.5)
WBC: 7.9 10*3/uL (ref 4.0–10.5)

## 2012-04-18 LAB — GLUCOSE, CAPILLARY
Glucose-Capillary: 131 mg/dL — ABNORMAL HIGH (ref 70–99)
Glucose-Capillary: 98 mg/dL (ref 70–99)
Glucose-Capillary: 96 mg/dL (ref 70–99)
Glucose-Capillary: 125 mg/dL — ABNORMAL HIGH (ref 70–99)

## 2012-04-18 LAB — CULTURE, BLOOD (ROUTINE X 2): Culture: NO GROWTH

## 2012-04-18 LAB — URINALYSIS, ROUTINE W REFLEX MICROSCOPIC
Bilirubin Urine: NEGATIVE
Protein, ur: NEGATIVE mg/dL
Urobilinogen, UA: 0.2 mg/dL (ref 0.0–1.0)

## 2012-04-18 LAB — MAGNESIUM: Magnesium: 2 mg/dL (ref 1.5–2.5)

## 2012-04-18 LAB — BASIC METABOLIC PANEL
CO2: 33 mEq/L — ABNORMAL HIGH (ref 19–32)
Chloride: 107 mEq/L (ref 96–112)
Creatinine, Ser: 0.44 mg/dL — ABNORMAL LOW (ref 0.50–1.10)

## 2012-04-18 LAB — HEPARIN INDUCED THROMBOCYTOPENIA PNL: UFH SRA Result: NEGATIVE

## 2012-04-18 LAB — TROPONIN I: Troponin I: <0.3 ng/mL (ref <0.30)

## 2012-04-18 LAB — COMPREHENSIVE METABOLIC PANEL
AST: 53 U/L — ABNORMAL HIGH (ref 0–37)
Albumin: 2.3 g/dL — ABNORMAL LOW (ref 3.5–5.2)
Calcium: 7.8 mg/dL — ABNORMAL LOW (ref 8.4–10.5)
Creatinine, Ser: 0.4 mg/dL — ABNORMAL LOW (ref 0.50–1.10)
GFR calc non Af Amer: >90 mL/min (ref >90)
Total Protein: 5.6 g/dL — ABNORMAL LOW (ref 6.0–8.3)

## 2012-04-18 LAB — PHOSPHORUS: Phosphorus: 2.5 mg/dL (ref 2.3–4.6)

## 2012-04-18 MED ORDER — DILTIAZEM LOAD VIA INFUSION: 10.0000 mg | Freq: Once | INTRAVENOUS | Status: DC

## 2012-04-18 MED ORDER — ACETAMINOPHEN 160 MG/5ML PO SOLN
650.0000 mg | ORAL | Status: DC | PRN
  Administered 2012-04-22 – 2012-04-25 (×6): 650 mg
  Filled 2012-04-18 (×6): qty 20.3

## 2012-04-18 MED ORDER — DILTIAZEM HCL 100 MG IV SOLR
5.0000 mg/h | INTRAVENOUS | Status: DC
  Administered 2012-04-18 – 2012-04-19 (×2): 5 mg/h via INTRAVENOUS
  Administered 2012-04-19: 10 mg/h via INTRAVENOUS
  Filled 2012-04-18: qty 100

## 2012-04-18 MED ORDER — RISPERIDONE 1 MG PO TABS
1.0000 mg | ORAL_TABLET | Freq: Two times a day (BID) | ORAL | Status: DC
  Administered 2012-04-18 – 2012-04-19 (×3): 1 mg via ORAL
  Filled 2012-04-18 (×4): qty 1

## 2012-04-18 MED ORDER — MAGNESIUM SULFATE 40 MG/ML IJ SOLN
4.0000 g | Freq: Once | INTRAMUSCULAR | Status: AC
  Administered 2012-04-19: 4 g via INTRAVENOUS
  Filled 2012-04-18 (×2): qty 100

## 2012-04-18 NOTE — Progress Notes (Addendum)
PULMONARY  / CRITICAL CARE MEDICINE  Name: Jennifer Hurley MRN: [id number] DOB: 1955-08-07    ADMISSION DATE:  04/12/2012 CONSULTATION DATE:  04/12/2012  REFERRING MD :  TRH  CHIEF COMPLAINT:  Acute respiratory failure  BRIEF PATIENT DESCRIPTION:  57 y/o with severe COPD, recent cavitary pneumonia and multiple recent hospitalizations for acute respiratory failure in setting of cocaine / alcohol abuse and medical noncompliance admitted 3/4 by [facility] with acute respiratory failure, failed BiPAP, intubated in ICU 3/4 pm and PCCM assumed care.      LINES / TUBES: OETT 3/4 >>> OGT 3/4 >>> L [place] CVL 3/4 >>> 04/17/12  R rad a line 3/4 >>  CULTURES: 3/4 MRSA screen>>> neg 3/4  Blood >>> 3/4  Respiratory > mod group A strep pyogenes >>  ANTIBIOTICS: Levaquin 3/4 >>>  SIGNIFICANT EVENTS / STUDIES:  3/06 - off pressors 3/07 - changed to continuous sedation due to air trapping 3/08 - Sedated, air stacking badly even with controlled rr of 16 3/09 - Significant cough tachycardia and hypertension on wake up assessment.   SUBJECTIVE/OVERNIGHT/INTERVAL HX 3/10 - Failed SBT, significant tachycardia  VITAL SIGNS: Temp:  [99.3 F (37.4 C)-100.2 F (37.9 C)] 100 F (37.8 C) (03/10 1200) Pulse Rate:  [88-154] 129 (03/10 1200) Resp:  [10-20] 14 (03/10 1200) BP: (106-160)/(63-101) 126/86 mmHg (03/10 1200) SpO2:  [94 %-97 %] 95 % (03/10 1200) Arterial Line BP: (105-175)/(53-113) 129/73 mmHg (03/10 1000) FiO2 (%):  [30 %] 30 % (03/10 1200) Weight:  [98 lb 1.7 oz (44.5 kg)] 98 lb 1.7 oz (44.5 kg) (03/10 0100)  VENTILATOR SETTINGS: Vent Mode:  [-] PRVC FiO2 (%):  [30 %] 30 % Set Rate:  [14 bmp-16 bmp] 16 bmp Vt Set:  [430 mL] 430 mL PEEP:  [5 cmH20] 5 cmH20 Plateau Pressure:  [12 cmH20-14 cmH20] 13 cmH20  INTAKE / OUTPUT: Intake/Output     03/09 0701 - 03/10 0700 03/10 0701 - 03/11 0700   I.V. (mL/kg) 1016.7 (22.8) 228.5 (5.1)   NG/GT 870 375   IV Piggyback 402    Total Intake(mL/kg)  2288.7 (51.4) 603.5 (13.6)   Urine (mL/kg/hr) 1620 (1.5) 535 (2.2)   Stool 1 (0)    Total Output 1621 535   Net +667.7 +68.5        Stool Occurrence  1 x    PHYSICAL EXAMINATION: General:  Appears chronically ill, cachectic, older than stated age, mechanically ventilated Neuro: Sedate, no distress HEENT:  PERRL, OETT/OGT, -thyromegally and - JVD. Cardiovascular:  Tachycardic, regular, no murmurs.  Lungs:  Bilateral diminished air entry, few expiratory wheezes. Abdomen:  Soft, nontender, bowel sounds diminished. Musculoskeletal:  Moves all extremities, no edema. Skin:  Intact.  LABS:   Recent Labs Lab 04/14/12 1008 04/15/12 0430 04/16/12 0454 04/17/12 0535 04/18/12 0410  NA 134* 138 146* 145 144  K 3.4* 3.6 4.1 3.7 3.8  CL 106 106 112 109 107  CO2 19 25 30  32 33*  GLUCOSE 208* 138* 155* 141* 160*  BUN 10 10 13 14 13   CREATININE 0.51 0.52 0.53 0.44* 0.44*  CALCIUM 6.7* 7.5* 8.0* 7.9* 7.9*  MG 2.9* 2.3 2.1 1.7 2.0  PHOS 3.2 1.9* 1.8* 2.2* 2.5    Recent Labs Lab 04/16/12 0454 04/17/12 0535 04/18/12 0410  HGB 9.0* 8.4* 9.2*  HCT 26.9* 25.4* 27.5*  WBC 3.0* 4.4 7.9  PLT 144* 159 215      Imaging x48 hours Dg Chest Port 1 View  04/18/2012  *RADIOLOGY REPORT*  Clinical Data: Endotracheal tube  PORTABLE CHEST - 1 VIEW  Comparison: 04/16/2012  Findings: Endotracheal tube slightly advanced now with its tip 1.9 cm from the carina.  Stable NG tube.  Bibasilar heterogeneous opacities improved on the left but stable on the right.  Right upper lobe opacities are stable. No pneumothorax.  Normal heart size.  IMPRESSION: Improved left basilar airspace disease.  Stable right lung airspace disease.   Original Report Authenticated By: Jolaine Click, M.D.    Dg Chest Port 1 View  04/16/2012  *RADIOLOGY REPORT*  Clinical Data: Central line placement.  PORTABLE CHEST - 1 VIEW  Comparison: Chest radiograph performed 04/15/2012  Findings: The patient's endotracheal tube is seen ending 4 cm  above the carina.  A left subclavian line is noted ending at the distal SVC.  An enteric tube is noted extending below the diaphragm.  The lungs are well expanded.  Bibasilar airspace opacities are perhaps mildly worsened, concerning for worsening multifocal pneumonia.  Minimal upper lung zone opacities are also seen.  Small bilateral pleural effusions are likely present.  No pneumothorax is identified.  The cardiomediastinal silhouette is normal in size.  No acute osseous abnormalities are seen.  A metallic ring overlying the gastroesophageal junction is outside the patient, per clinical correlation.  IMPRESSION:  1.  Endotracheal tube seen ending 4 cm above the carina. 2.  Left subclavian line still seen ending at the distal SVC. 3.  Mildly worsened bibasilar airspace opacities concerning for mildly worsened multifocal pneumonia.  Likely small bilateral pleural effusions.   Original Report Authenticated By: Tonia Ghent, M.D.     ASSESSMENT / PLAN:  PULMONARY  A:   Acute on chronic respiratory failure.   COPD - with exacerbation.   Resolving cavitary RUL pneumonia -  no new infiltrates.  History of recent negative PPD and equivocal quantiferon gold - doubt acute TB given improving infiltrates.   P:   Full mechanical ventilation support - Goal SpO2>92, pH>7.30 - alb/atrovent/pulmocort - Solu-Medrol, weaned to 80 q 12h 3/7    CARDIOVASCULAR A:  Chronic systolic CHF without evidence of exacerbation.   Possible core pulmonale.   Hypotension - likely secondary to hypovolemia.  Resolved.  Possible SIRS.   CAD.   - euvolemic off pressors 3/7  P: -Monitor -Mean arterial pressure goal greater than 60    RENAL  A:   Hyponatremia, chronic, resolved   Suspected SIADH.   AKI.   Hypovolemia resolved Hypomagnesemia  Hypophosphatemia  Hypokalemia    P:   D/C demeclocycline F/u lytes, replace PRN    GASTROINTESTINAL  A:   Severe malnutrition.  Pulmonary cachexia.  Adult failure  to thrive.   Elevated transaminases in setting of ETOH.  At risk for refeeding syndrome.  Acute hepatitis panel with B surface ab positive but ag negative.  P:   -TF started 3/5. -Nutritionist consult appreciated, TF tolerated. -Protonix for GI Px   HEMATOLOGIC   A:   Thrombocytopenia  Anemia critical illness Leukopenia  P:  -PRBC for hemoglobin less than 7 g percent -SCD while awaiting HIT panel (improved off heparin) 3/6 >>>  INFECTIOUS  A:   Resolving pneumonia.  Doubt TB.  Sputum AFB neg culture 02/16/12 Empiric coverage for COPD exacerbation.   P:   -Cultures and antibiotics as above    ENDOCRINE   A:   Hypothermia, concern for adrenal insufficiency - on admission, now rewarmed.  Equivocal Cortisol level previously.  P:   No interventions required while on solumedrol,  once off may need stress dose steroids and home dose after.   NEUROLOGIC A:   Acute encephalopathy - multifactorial.   History of ETOH / cocaine abuse - but clean drug screen on admission.  At risk for withdrawal.  Previous Psychiatric evaluations and questions of decision making capacity.   P:   -Goal RASS 0 to -1 -Changed to continuous sedation for air trapping 3/7 -add risperdal, attempt to wean gtts -Thiamine / Folate. -consider CT head if when off sedation concerning exam    The patient is critically ill with multiple organ systems failure and requires high complexity decision making for assessment and support, frequent evaluation and titration of therapies, application of advanced monitoring technologies and extensive interpretation of multiple databases. Critical Care Time devoted to patient care services described in this note is 40 minutes.    [name], MD ; [facility] [phone].  After 5:30 PM or weekends, call [phone]    04/18/2012 12:25 PM

## 2012-04-18 NOTE — Progress Notes (Signed)
ANTIBIOTIC CONSULT NOTE - FOLLOW UP  Pharmacy Consult for Levaquin Indication: COPD exacerbation  No Known Allergies  Patient Measurements: Height: 5\' 1"  (154.9 cm) Weight: 98 lb 1.7 oz (44.5 kg) IBW/kg (Calculated) : 47.8  Vital Signs: Temp: 100 F (37.8 C) (03/10 1200) Temp src: Core (Comment) (03/10 1200) BP: 126/86 mmHg (03/10 1200) Pulse Rate: 129 (03/10 1200)  Labs:  Recent Labs  04/16/12 0454 04/17/12 0535 04/18/12 0410  WBC 3.0* 4.4 7.9  HGB 9.0* 8.4* 9.2*  PLT 144* 159 215  CREATININE 0.53 0.44* 0.44*   Estimated Creatinine Clearance: 55.8 ml/min (by C-G formula based on Cr of 0.44).   Microbiology: Recent Results (from the past 720 hour(s))  CULTURE, BLOOD (ROUTINE X 2)     Status: None   Collection Time    04/12/12 12:50 PM      Result Value Range Status   Specimen Description BLOOD LEFT FEMORAL   Final   Special Requests BOTTLES DRAWN AEROBIC ONLY   Final   Culture  Setup Time 04/12/2012 21:29   Final   Culture NO GROWTH 5 DAYS   Final   Report Status 04/18/2012 FINAL   Final  CULTURE, BLOOD (ROUTINE X 2)     Status: None   Collection Time    04/12/12  1:20 PM      Result Value Range Status   Specimen Description BLOOD RIGHT FOREARM   Final   Special Requests BOTTLES DRAWN AEROBIC ONLY   Final   Culture  Setup Time 04/12/2012 21:29   Final   Culture NO GROWTH 5 DAYS   Final   Report Status 04/18/2012 FINAL   Final  MRSA PCR SCREENING     Status: None   Collection Time    04/12/12  4:04 PM      Result Value Range Status   MRSA by PCR NEGATIVE  NEGATIVE Final   Comment:            The GeneXpert MRSA Assay (FDA     approved for NASAL specimens     only), is one component of a     comprehensive MRSA colonization     surveillance program. It is not     intended to diagnose MRSA     infection nor to guide or     monitor treatment for     MRSA infections.  CULTURE, RESPIRATORY (NON-EXPECTORATED)     Status: None   Collection Time   04/12/12  9:24 PM      Result Value Range Status   Specimen Description TRACHEAL ASPIRATE   Final   Special Requests NONE   Final   Gram Stain     Final   Value: FEW WBC PRESENT,BOTH PMN AND MONONUCLEAR     RARE SQUAMOUS EPITHELIAL CELLS PRESENT     FEW GRAM POSITIVE RODS     RARE GRAM NEGATIVE RODS     RARE GRAM POSITIVE COCCI   Culture MODERATE GROUP A STREP (S.PYOGENES) ISOLATED   Final   Report Status 04/15/2012 FINAL   Final    Anti-infectives   Start     Dose/Rate Route Frequency Ordered Stop   04/15/12 1800  levofloxacin (LEVAQUIN) IVPB 750 mg     750 mg 100 mL/hr over 90 Minutes Intravenous Every 24 hours 04/15/12 0934     04/13/12 1130  demeclocycline (DECLOMYCIN) tablet 150 mg     150 mg Oral Every 12 hours 04/13/12 1047     04/12/12 2200  demeclocycline (DECLOMYCIN) tablet 300 mg  Status:  Discontinued     300 mg Oral Every 12 hours 04/12/12 1611 04/12/12 2229   04/12/12 1700  levofloxacin (LEVAQUIN) IVPB 750 mg  Status:  Discontinued     750 mg 100 mL/hr over 90 Minutes Intravenous Every 48 hours 04/12/12 1644 04/15/12 0934      Assessment: 57 y/o with respiratory failure, severe COPD, recent cavitary pneumonia and multiple recent hospitalizations for acute respiratory failure.  Pt currently on D#9 IV levaquin per pharmacy.  Tmax: 100.2  WBCs: WNL  Renal: Scr stable/low, CG 56 ml/min  Cultures: MRSA: negative, 3/4 blood x 2 >> NGF, 3/4 trach asp >> mod Group A strep (S.PYOGENES)    Goal of Therapy:  Appropriate abx dosing, eradication of infection.   Plan:   Continue Levaquin 750mg  IV q24h.  Follow-up duration of therapy  Haynes Hoehn, PharmD 04/18/2012 1:47 PM  Pager: [phone]

## 2012-04-18 NOTE — Progress Notes (Signed)
Tachycardia, seems sinus but trending up, now febrile, on Levaquin.   Routine labs ordered stat, cultures to be repeated. Tylenol prn for fever

## 2012-04-18 NOTE — Progress Notes (Signed)
eLink Physician-Brief Progress Note Patient Name: Jennifer Hurley DOB: April 21, 1955 MRN: [id number]  Date of Service  04/18/2012   HPI/Events of Note  Hypomag   eICU Interventions  Magnesium replaced   Intervention Category Intermediate Interventions: Electrolyte abnormality - evaluation and management  Jennifer Hurley 04/18/2012, 11:33 PM

## 2012-04-19 ENCOUNTER — Inpatient Hospital Stay (HOSPITAL_COMMUNITY): Payer: Medicare Other

## 2012-04-19 DIAGNOSIS — I471 Supraventricular tachycardia: Secondary | ICD-10-CM

## 2012-04-19 LAB — BASIC METABOLIC PANEL
CO2: 36 mEq/L — ABNORMAL HIGH (ref 19–32)
Calcium: 7.8 mg/dL — ABNORMAL LOW (ref 8.4–10.5)
Creatinine, Ser: 0.37 mg/dL — ABNORMAL LOW (ref 0.50–1.10)
GFR calc non Af Amer: >90 mL/min (ref >90)
Sodium: 142 mEq/L (ref 135–145)

## 2012-04-19 LAB — CBC
MCV: 81.1 fL (ref 78.0–100.0)
Platelets: 205 10*3/uL (ref 150–400)
RDW: 21.1 % — ABNORMAL HIGH (ref 11.5–15.5)
WBC: 6.6 10*3/uL (ref 4.0–10.5)

## 2012-04-19 LAB — URINE CULTURE: Culture: NO GROWTH

## 2012-04-19 LAB — GLUCOSE, CAPILLARY
Glucose-Capillary: 126 mg/dL — ABNORMAL HIGH (ref 70–99)
Glucose-Capillary: 130 mg/dL — ABNORMAL HIGH (ref 70–99)
Glucose-Capillary: 132 mg/dL — ABNORMAL HIGH (ref 70–99)
Glucose-Capillary: 136 mg/dL — ABNORMAL HIGH (ref 70–99)
Glucose-Capillary: 167 mg/dL — ABNORMAL HIGH (ref 70–99)

## 2012-04-19 LAB — TROPONIN I
Troponin I: <0.3 ng/mL (ref <0.30)
Troponin I: <0.3 ng/mL (ref <0.30)

## 2012-04-19 MED ORDER — METHYLPREDNISOLONE SODIUM SUCC 40 MG IJ SOLR
40.0000 mg | Freq: Two times a day (BID) | INTRAMUSCULAR | Status: DC
  Administered 2012-04-19 – 2012-04-20 (×2): 40 mg via INTRAVENOUS
  Filled 2012-04-19 (×4): qty 1

## 2012-04-19 MED ORDER — METOPROLOL TARTRATE 25 MG/10 ML ORAL SUSPENSION
12.5000 mg | Freq: Two times a day (BID) | ORAL | Status: DC
  Administered 2012-04-19: 12.5 mg via ORAL
  Filled 2012-04-19 (×4): qty 5

## 2012-04-19 MED ORDER — SODIUM CHLORIDE 0.9 % IV SOLN
INTRAVENOUS | Status: DC
  Administered 2012-04-19: 500 mL via INTRAVENOUS

## 2012-04-19 MED ORDER — LEVOFLOXACIN 500 MG PO TABS
500.0000 mg | ORAL_TABLET | Freq: Every day | ORAL | Status: DC
  Administered 2012-04-19: 500 mg
  Filled 2012-04-19 (×2): qty 1

## 2012-04-19 MED ORDER — DEXMEDETOMIDINE HCL IN NACL 200 MCG/50ML IV SOLN
0.2000 ug/kg/h | INTRAVENOUS | Status: DC
  Administered 2012-04-19: 0.4 ug/kg/h via INTRAVENOUS
  Administered 2012-04-19: 0.6 ug/kg/h via INTRAVENOUS
  Administered 2012-04-19: 0.5 ug/kg/h via INTRAVENOUS
  Filled 2012-04-19 (×3): qty 50

## 2012-04-19 MED ORDER — FENTANYL CITRATE 0.05 MG/ML IJ SOLN
25.0000 ug | INTRAMUSCULAR | Status: DC | PRN
  Administered 2012-04-19 – 2012-04-20 (×2): 50 ug via INTRAVENOUS
  Administered 2012-04-20: 25 ug via INTRAVENOUS
  Administered 2012-04-21 – 2012-04-22 (×4): 50 ug via INTRAVENOUS
  Filled 2012-04-19: qty 4
  Filled 2012-04-19 (×8): qty 2

## 2012-04-19 MED ORDER — RISPERIDONE 1 MG PO TABS
1.0000 mg | ORAL_TABLET | Freq: Every day | ORAL | Status: DC
  Filled 2012-04-19: qty 1

## 2012-04-19 MED ORDER — LEVOFLOXACIN 25 MG/ML PO SOLN: 500.0000 mg | Freq: Every day | ORAL | Status: DC

## 2012-04-19 MED ORDER — DEXMEDETOMIDINE HCL IN NACL 400 MCG/100ML IV SOLN
0.2000 ug/kg/h | INTRAVENOUS | Status: DC
  Administered 2012-04-19: 0.8 ug/kg/h via INTRAVENOUS
  Administered 2012-04-20: 1.2 ug/kg/h via INTRAVENOUS
  Filled 2012-04-19 (×2): qty 100

## 2012-04-19 MED ORDER — SODIUM CHLORIDE 0.9 % IV SOLN
Freq: Once | INTRAVENOUS | Status: AC
  Administered 2012-04-19: 500 mL via INTRAVENOUS

## 2012-04-19 MED ORDER — MIDAZOLAM HCL 2 MG/2ML IJ SOLN: 2.0000 mg | INTRAMUSCULAR | Status: DC | PRN

## 2012-04-19 MED ORDER — MIDAZOLAM HCL 2 MG/2ML IJ SOLN
2.0000 mg | INTRAMUSCULAR | Status: DC | PRN
  Administered 2012-04-19: 2 mg via INTRAVENOUS
  Filled 2012-04-19: qty 2

## 2012-04-19 MED ORDER — SODIUM CHLORIDE 0.9 % IV SOLN
INTRAVENOUS | Status: DC
  Administered 2012-04-21: 20 mL/h via INTRAVENOUS
  Administered 2012-04-23: 10 mL/h via INTRAVENOUS

## 2012-04-19 MED ORDER — METOPROLOL TARTRATE 1 MG/ML IV SOLN
2.5000 mg | Freq: Once | INTRAVENOUS | Status: AC
  Administered 2012-04-19: 2.5 mg via INTRAVENOUS
  Filled 2012-04-19: qty 5

## 2012-04-19 NOTE — Progress Notes (Signed)
PULMONARY  / CRITICAL CARE MEDICINE  Name: Jennifer Hurley MRN: [id number] DOB: [birth year]    ADMISSION DATE:  04/12/2012 CONSULTATION DATE:  04/12/2012  REFERRING MD :  TRH  CHIEF COMPLAINT:  Acute respiratory failure  BRIEF PATIENT DESCRIPTION:  57 y/o with severe COPD, recent cavitary pneumonia and multiple recent hospitalizations for acute respiratory failure in setting of cocaine / alcohol abuse and medical noncompliance admitted 3/4 by [facility] with acute respiratory failure, failed BiPAP, intubated in ICU 3/4 pm and PCCM assumed care.      LINES / TUBES: OETT 3/4 >>> OGT 3/4 >>> L [place] CVL 3/4 >>> 04/17/12  R rad a line 3/4 >>  CULTURES: 3/4 MRSA screen>>> neg 3/4  Blood >>>neg 3/4  Respiratory > mod group A strep pyogenes >>  ANTIBIOTICS: Levaquin 3/4 >>>  SIGNIFICANT EVENTS / STUDIES:  3/06 - off pressors 3/07 - changed to continuous sedation due to air trapping 3/08 - Sedated, air stacking badly even with controlled rr of 16 3/09 - Significant cough tachycardia and hypertension on wake up assessment. 3/10 - Failed SBT, significant tachycardia  SUBJECTIVE/OVERNIGHT/INTERVAL HX 3/11 - RN reports minimal response with WUA from neuro standpoint.  Tachy with SBT  VITAL SIGNS: Temp:  [98.4 F (36.9 C)-101.5 F (38.6 C)] 99.3 F (37.4 C) (03/11 1000) Pulse Rate:  [95-134] 134 (03/11 1000) Resp:  [13-23] 23 (03/11 1000) BP: (75-136)/(46-86) 136/84 mmHg (03/11 1000) SpO2:  [90 %-97 %] 93 % (03/11 1000) FiO2 (%):  [30 %] 30 % (03/11 1000) Weight:  [98 lb 1.7 oz (44.5 kg)] 98 lb 1.7 oz (44.5 kg) (03/11 0500)  VENTILATOR SETTINGS: Vent Mode:  [-] PRVC FiO2 (%):  [30 %] 30 % Set Rate:  [14 bmp-16 bmp] 14 bmp Vt Set:  [430 mL] 430 mL PEEP:  [5 cmH20] 5 cmH20 Plateau Pressure:  [10 cmH20-13 cmH20] 10 cmH20  INTAKE / OUTPUT: Intake/Output     03/10 0701 - 03/11 0700 03/11 0701 - 03/12 0700   I.V. (mL/kg) 968.4 (21.8) 142 (3.2)   NG/GT 1150 105   IV Piggyback 250    Total  Intake(mL/kg) 2368.4 (53.2) 247 (5.6)   Urine (mL/kg/hr) 2710 (2.5) 535 (3.5)   Stool     Total Output 2710 535   Net -341.6 -288        Stool Occurrence 1 x     PHYSICAL EXAMINATION: General:  Appears chronically ill, cachectic, older than stated age, mechanically ventilated Neuro: Sedate, no distress,  HEENT:  PERRL, OETT/OGT, -thyromegally and - JVD. Large protruding tongue Cardiovascular:  Tachycardic, regular, no murmurs.  Lungs:  Bilateral diminished air entry, few expiratory wheezes. Abdomen:  Soft, nontender, bowel sounds diminished. Musculoskeletal:  Moves all extremities, no edema. Skin:  Intact.  LABS:   Recent Labs Lab 04/14/12 1008 04/15/12 0430 04/16/12 0454 04/17/12 0535 04/18/12 0410 04/18/12 2230 04/19/12 0335  NA 134* 138 146* 145 144 143 142  K 3.4* 3.6 4.1 3.7 3.8 3.5 3.5  CL 106 106 112 109 107 101 99  CO2 19 25 30  32 33* 34* 36*  GLUCOSE 208* 138* 155* 141* 160* 156* 156*  BUN 10 10 13 14 13 11 11   CREATININE 0.51 0.52 0.53 0.44* 0.44* 0.40* 0.37*  CALCIUM 6.7* 7.5* 8.0* 7.9* 7.9* 7.8* 7.8*  MG 2.9* 2.3 2.1 1.7 2.0 1.4*  --   PHOS 3.2 1.9* 1.8* 2.2* 2.5  --   --     Recent Labs Lab 04/18/12 0410 04/18/12 2230 04/19/12  0335  HGB 9.2* 9.4* 9.6*  HCT 27.5* 28.0* 27.9*  WBC 7.9 6.3 6.6  PLT 215 206 205      Imaging x48 hours Dg Chest Port 1 View  04/19/2012  *RADIOLOGY REPORT*  Clinical Data: Airspace disease.  Check endotracheal tube.  PORTABLE CHEST - 1 VIEW  Comparison: 04/18/2012  Findings: Endotracheal tube is roughly 4.8 cm above the carina. Nasogastric tube extends into the abdomen.  Stable hyperinflation with chronic lung changes.  No significant change in the interstitial densities at the lung bases.  Cannot exclude small effusions. Heart size is stable.  IMPRESSION: Minimal change in the parenchymal lung disease.  Evidence for acute or chronic disease.  Support apparatuses as described.   Original Report Authenticated By: Richarda Overlie,  M.D.    Dg Chest Port 1 View  04/18/2012  *RADIOLOGY REPORT*  Clinical Data: Endotracheal tube  PORTABLE CHEST - 1 VIEW  Comparison: 04/16/2012  Findings: Endotracheal tube slightly advanced now with its tip 1.9 cm from the carina.  Stable NG tube.  Bibasilar heterogeneous opacities improved on the left but stable on the right.  Right upper lobe opacities are stable. No pneumothorax.  Normal heart size.  IMPRESSION: Improved left basilar airspace disease.  Stable right lung airspace disease.   Original Report Authenticated By: Jolaine Click, M.D.     ASSESSMENT / PLAN:  PULMONARY  A:   Acute on chronic respiratory failure.   COPD - with exacerbation.   Resolving cavitary RUL pneumonia -  no new infiltrates.  History of recent negative PPD and equivocal quantiferon gold - doubt acute TB given improving infiltrates.   P:   -Full mechanical ventilation support -Goal SpO2>92, pH>7.30 -alb/atrovent/pulmicort -Solu-Medrol, weaned to 80 3/7>>>40 q 12h 3/11 -Daily SBT -trial precedex 3/11   CARDIOVASCULAR A:  Chronic systolic CHF without evidence of exacerbation.   Possible core pulmonale.   Hypotension - likely secondary to hypovolemia.  Resolved.  Possible SIRS.   CAD.   - euvolemic off pressors 3/7  P: -Monitor -Mean arterial pressure goal greater than 60    RENAL  A:   Hyponatremia, chronic, resolved   Suspected SIADH.   AKI.   Hypovolemia resolved Hypomagnesemia  Hypophosphatemia  Hypokalemia    P:   F/u lytes, replace PRN    GASTROINTESTINAL  A:   Severe malnutrition.  Pulmonary cachexia.  Adult failure to thrive.   Elevated transaminases in setting of ETOH.  At risk for refeeding syndrome.  Acute hepatitis panel with B surface ab positive but ag negative.  P:   -TF started 3/5. -Nutritionist consult appreciated, TF tolerated. -Protonix for GI Px   HEMATOLOGIC   A:   Thrombocytopenia - resolved off heparin.  HIT neg Anemia critical  illness Leukopenia  P:  -PRBC for hemoglobin less than 7 g percent -SCD while awaiting HIT panel (improved off heparin) 3/6 >>>negative  INFECTIOUS  A:   Resolving pneumonia.  Doubt TB.  Sputum AFB neg culture 02/16/12 Empiric coverage for COPD exacerbation.   P:   -Cultures and antibiotics as above    ENDOCRINE   A:   Hypothermia, concern for adrenal insufficiency - on admission, now rewarmed.  Equivocal Cortisol level previously.  P:   -wean steroids slowly    NEUROLOGIC A:   Acute encephalopathy - multifactorial.   History of ETOH / cocaine abuse - but clean drug screen on admission.  At risk for withdrawal.   Previous Psychiatric evaluations and questions of decision making capacity.  P:   -Goal RASS 0 to -1 -change to precedex, d/c continuous sedation -add risperdal, attempt to wean gtts -Thiamine / Folate. -consider CT head if when off sedation concerning exam    The patient is critically ill with multiple organ systems failure and requires high complexity decision making for assessment and support, frequent evaluation and titration of therapies, application of advanced monitoring technologies and extensive interpretation of multiple databases. Critical Care Time devoted to patient care services described in this note is 40 minutes.   [name], MD ; [facility] [phone].  After 5:30 PM or weekends, call [phone]   04/19/2012 10:26 AM

## 2012-04-20 ENCOUNTER — Inpatient Hospital Stay (HOSPITAL_COMMUNITY): Payer: Medicare Other

## 2012-04-20 LAB — CBC
HCT: 27 % — ABNORMAL LOW (ref 36.0–46.0)
Hemoglobin: 9.2 g/dL — ABNORMAL LOW (ref 12.0–15.0)
MCH: 27.7 pg (ref 26.0–34.0)
MCV: 81.3 fL (ref 78.0–100.0)
RBC: 3.32 MIL/uL — ABNORMAL LOW (ref 3.87–5.11)

## 2012-04-20 LAB — BASIC METABOLIC PANEL
CO2: 32 mEq/L (ref 19–32)
Calcium: 8 mg/dL — ABNORMAL LOW (ref 8.4–10.5)
Chloride: 99 mEq/L (ref 96–112)
Glucose, Bld: 157 mg/dL — ABNORMAL HIGH (ref 70–99)
Potassium: 3.2 mEq/L — ABNORMAL LOW (ref 3.5–5.1)
Sodium: 140 mEq/L (ref 135–145)

## 2012-04-20 LAB — GLUCOSE, CAPILLARY
Glucose-Capillary: 146 mg/dL — ABNORMAL HIGH (ref 70–99)
Glucose-Capillary: 162 mg/dL — ABNORMAL HIGH (ref 70–99)

## 2012-04-20 MED ORDER — POTASSIUM CHLORIDE 20 MEQ/15ML (10%) PO LIQD
40.0000 meq | ORAL | Status: AC
  Administered 2012-04-20 (×2): 40 meq
  Filled 2012-04-20 (×2): qty 30

## 2012-04-20 MED ORDER — FUROSEMIDE 10 MG/ML IJ SOLN
20.0000 mg | Freq: Once | INTRAMUSCULAR | Status: AC
  Administered 2012-04-20: 20 mg via INTRAVENOUS
  Filled 2012-04-20: qty 4

## 2012-04-20 MED ORDER — POTASSIUM CHLORIDE 20 MEQ/15ML (10%) PO LIQD
40.0000 meq | Freq: Once | ORAL | Status: AC
  Administered 2012-04-20: 40 meq
  Filled 2012-04-20 (×2): qty 30

## 2012-04-20 MED ORDER — METOPROLOL TARTRATE 12.5 MG HALF TABLET
12.5000 mg | ORAL_TABLET | Freq: Two times a day (BID) | ORAL | Status: DC
  Administered 2012-04-20 – 2012-04-21 (×3): 12.5 mg via ORAL
  Filled 2012-04-20 (×6): qty 1

## 2012-04-20 MED ORDER — MIDAZOLAM HCL 2 MG/2ML IJ SOLN: 2.0000 mg | Freq: Once | INTRAMUSCULAR | Status: DC

## 2012-04-20 MED ORDER — METOPROLOL TARTRATE 1 MG/ML IV SOLN
6.2500 mg | Freq: Four times a day (QID) | INTRAVENOUS | Status: DC
  Administered 2012-04-20: 6.25 mg via INTRAVENOUS
  Filled 2012-04-20: qty 10

## 2012-04-20 MED ORDER — THIAMINE HCL 100 MG/ML IJ SOLN
100.0000 mg | Freq: Every day | INTRAMUSCULAR | Status: DC
  Administered 2012-04-20: 100 mg via INTRAVENOUS
  Filled 2012-04-20 (×2): qty 1

## 2012-04-20 MED ORDER — HYDRALAZINE HCL 20 MG/ML IJ SOLN
10.0000 mg | INTRAMUSCULAR | Status: DC | PRN
  Administered 2012-04-21: 10 mg via INTRAVENOUS
  Administered 2012-04-22: 20 mg via INTRAVENOUS
  Filled 2012-04-20 (×2): qty 1

## 2012-04-20 MED ORDER — LORAZEPAM 2 MG/ML IJ SOLN
0.5000 mg | INTRAMUSCULAR | Status: DC | PRN
  Administered 2012-04-20 – 2012-04-25 (×7): 1 mg via INTRAVENOUS
  Filled 2012-04-20 (×7): qty 1

## 2012-04-20 MED ORDER — ROCURONIUM BROMIDE 50 MG/5ML IV SOLN
INTRAVENOUS | Status: AC
  Filled 2012-04-20: qty 2

## 2012-04-20 MED ORDER — LIDOCAINE HCL (CARDIAC) 20 MG/ML IV SOLN
INTRAVENOUS | Status: AC
  Filled 2012-04-20: qty 5

## 2012-04-20 MED ORDER — ADULT MULTIVITAMIN W/MINERALS CH
1.0000 | ORAL_TABLET | Freq: Every day | ORAL | Status: DC
  Administered 2012-04-20 – 2012-04-25 (×6): 1
  Filled 2012-04-20 (×6): qty 1

## 2012-04-20 MED ORDER — ETOMIDATE 2 MG/ML IV SOLN
INTRAVENOUS | Status: AC
  Administered 2012-04-20: 3 mg
  Filled 2012-04-20: qty 20

## 2012-04-20 MED ORDER — SUCCINYLCHOLINE CHLORIDE 20 MG/ML IJ SOLN
INTRAMUSCULAR | Status: AC
  Filled 2012-04-20: qty 1

## 2012-04-20 MED ORDER — LORAZEPAM 2 MG/ML IJ SOLN
0.5000 mg | Freq: Four times a day (QID) | INTRAMUSCULAR | Status: DC | PRN
  Administered 2012-04-20: 0.5 mg via INTRAVENOUS
  Filled 2012-04-20: qty 1

## 2012-04-20 MED ORDER — METHYLPREDNISOLONE SODIUM SUCC 40 MG IJ SOLR
40.0000 mg | Freq: Every day | INTRAMUSCULAR | Status: DC
  Administered 2012-04-21: 40 mg via INTRAVENOUS
  Filled 2012-04-20: qty 1

## 2012-04-20 MED ORDER — LEVOFLOXACIN IN D5W 500 MG/100ML IV SOLN
500.0000 mg | INTRAVENOUS | Status: DC
  Administered 2012-04-20: 500 mg via INTRAVENOUS
  Filled 2012-04-20 (×2): qty 100

## 2012-04-20 MED ORDER — JEVITY 1.2 CAL PO LIQD
1000.0000 mL | ORAL | Status: DC
  Administered 2012-04-20 – 2012-04-24 (×5): 1000 mL

## 2012-04-20 MED ORDER — MIDAZOLAM HCL 2 MG/2ML IJ SOLN
INTRAMUSCULAR | Status: AC
  Filled 2012-04-20: qty 4

## 2012-04-20 MED ORDER — MAGNESIUM SULFATE 40 MG/ML IJ SOLN
2.0000 g | Freq: Once | INTRAMUSCULAR | Status: AC
  Administered 2012-04-20: 2 g via INTRAVENOUS
  Filled 2012-04-20: qty 50

## 2012-04-20 MED ORDER — FOLIC ACID 5 MG/ML IJ SOLN
1.0000 mg | Freq: Every day | INTRAMUSCULAR | Status: DC
  Administered 2012-04-20 – 2012-04-21 (×2): 1 mg via INTRAVENOUS
  Filled 2012-04-20 (×2): qty 0.2

## 2012-04-20 MED ORDER — DEXMEDETOMIDINE HCL IN NACL 400 MCG/100ML IV SOLN
0.2000 ug/kg/h | INTRAVENOUS | Status: DC
  Administered 2012-04-20 – 2012-04-21 (×3): 0.7 ug/kg/h via INTRAVENOUS
  Filled 2012-04-20 (×2): qty 100

## 2012-04-20 NOTE — Procedures (Signed)
Intubation Procedure Note Jennifer Hurley [id number] [birth year]  Procedure: Intubation Indications: Respiratory insufficiency  Procedure Details Consent: Risks of procedure as well as the alternatives and risks of each were explained to the (patient/caregiver).  Consent for procedure obtained. Time Out: Verified patient identification, verified procedure, site/side was marked, verified correct patient position, special equipment/implants available, medications/allergies/relevent history reviewed, required imaging and test results available.  Performed  Maximum sterile technique was used including cap, gloves, hand hygiene and mask.  MAC and 3    Evaluation Hemodynamic Status: BP stable throughout; O2 sats: stable throughout Patient's Current Condition: stable Complications: No apparent complications Patient did tolerate procedure well. Chest X-ray ordered to verify placement.  CXR: pending.  Procedure performed under direct supervision of Dr. Sung Amabile.     [name], NP-C [facility] Pgr: [phone] or [phone]   04/20/2012  I was present for and supervised the entire procedure  [name], MD ; [employer] service Mobile [phone].  After 5:30 PM or weekends, call [phone]

## 2012-04-20 NOTE — Progress Notes (Addendum)
PULMONARY  / CRITICAL CARE MEDICINE  Name: Jennifer Hurley MRN: [id number] DOB: 1955/09/17    ADMISSION DATE:  04/12/2012 CONSULTATION DATE:  04/12/2012  REFERRING MD :  TRH  CHIEF COMPLAINT:  Acute respiratory failure  BRIEF PATIENT DESCRIPTION:  57 y/o with severe COPD, recent cavitary pneumonia and multiple recent hospitalizations for acute respiratory failure in setting of cocaine / alcohol abuse and medical noncompliance admitted 3/4 by [facility] with acute respiratory failure, failed BiPAP, intubated in ICU 3/4 pm and PCCM assumed care.      LINES / TUBES: OETT 3/4 >>>3/12, 3/12>>> OGT 3/4 >>>3/12 L [place] CVL 3/4 >>> 04/17/12  R rad a line 3/4 >>  CULTURES: 3/4 MRSA screen>>> neg 3/4  Blood >>>neg 3/4  Respiratory>>>mod group A strep pyogenes  ANTIBIOTICS: Levaquin 3/4 >>>  SIGNIFICANT EVENTS / STUDIES:  3/06 - off pressors 3/07 - changed to continuous sedation due to air trapping 3/08 - Sedated, air stacking badly even with controlled rr of 16 3/09 - Significant cough tachycardia and hypertension on wake up assessment. 3/10 - Failed SBT, significant tachycardia 3/12- Extubated, remains tachycardic and hypertensive.  Re-intubated afternoon for poor resp mechanics/profound fatigue  SUBJECTIVE/OVERNIGHT/INTERVAL HX 3/12: RN reports pt more awake, weak but following commands.  No acute events.   VITAL SIGNS: Temp:  [95.9 F (35.5 C)-99.9 F (37.7 C)] 95.9 F (35.5 C) (03/12 0900) Pulse Rate:  [73-131] 94 (03/12 0900) Resp:  [15-29] 19 (03/12 0900) BP: (77-182)/(52-118) 170/118 mmHg (03/12 0900) SpO2:  [92 %-100 %] 98 % (03/12 0900) FiO2 (%):  [30 %-40 %] 30 % (03/12 0852) Weight:  [97 lb 10.6 oz (44.3 kg)] 97 lb 10.6 oz (44.3 kg) (03/12 0500)  VENTILATOR SETTINGS: Vent Mode:  [-] CPAP;PSV FiO2 (%):  [30 %-40 %] 30 % Set Rate:  [14 bmp] 14 bmp Vt Set:  [430 mL] 430 mL PEEP:  [5 cmH20] 5 cmH20 Pressure Support:  [5 cmH20-10 cmH20] 5 cmH20 Plateau Pressure:  [15 cmH20-20  cmH20] 15 cmH20  INTAKE / OUTPUT: Intake/Output     03/11 0701 - 03/12 0700 03/12 0701 - 03/13 0700   I.V. (mL/kg) 1470 (33.2) 64.5 (1.5)   NG/GT 840 70   IV Piggyback     Total Intake(mL/kg) 2310 (52.1) 134.5 (3)   Urine (mL/kg/hr) 2450 (2.3)    Stool 1 (0)    Total Output 2451     Net -141.1 +134.5         PHYSICAL EXAMINATION: General:  Appears chronically ill, cachectic, older than stated age Neuro: Alert and following commands. HEENT:  PERRL, OETT/OGT, -thyromegally and - JVD. Large protruding tongue. Cardiovascular:  Tachycardic, regular S1 and S2, no murmurs, hypertensive.  Lungs:  Bilaterally diminished BS,  expiratory wheezes.  Accessory muscle use noted. Abdomen:  Soft, nontender, bowel sounds diminished. Musculoskeletal:  Moves all extremities, 1+ edema to upper extremities.Generalized weakness. Skin:  Intact.  LABS:   Recent Labs Lab 04/14/12 1008 04/15/12 0430 04/16/12 0454 04/17/12 0535 04/18/12 0410 04/18/12 2230 04/19/12 0335 04/20/12 0340  NA 134* 138 146* 145 144 143 142 140  K 3.4* 3.6 4.1 3.7 3.8 3.5 3.5 3.2*  CL 106 106 112 109 107 101 99 99  CO2 19 25 30  32 33* 34* 36* 32  GLUCOSE 208* 138* 155* 141* 160* 156* 156* 157*  BUN 10 10 13 14 13 11 11 15   CREATININE 0.51 0.52 0.53 0.44* 0.44* 0.40* 0.37* 0.37*  CALCIUM 6.7* 7.5* 8.0* 7.9* 7.9* 7.8* 7.8* 8.0*  MG 2.9* 2.3 2.1 1.7 2.0 1.4*  --   --   PHOS 3.2 1.9* 1.8* 2.2* 2.5  --   --   --     Recent Labs Lab 04/18/12 2230 04/19/12 0335 04/20/12 0340  HGB 9.4* 9.6* 9.2*  HCT 28.0* 27.9* 27.0*  WBC 6.3 6.6 6.2  PLT 206 205 202      Imaging x48 hours Dg Chest Port 1 View  04/20/2012  *RADIOLOGY REPORT*  Clinical Data: Evaluate endotracheal tube placement.  PORTABLE CHEST - 1 VIEW  Comparison: Chest x-ray 04/19/2012.  Findings: An endotracheal tube is in place with tip 2.3 cm above the carina. A nasogastric tube is seen extending into the stomach, however, the tip of the nasogastric tube  extends below the lower margin of the image.  There are persistent and slightly increasing bibasilar opacities which may reflect areas of atelectasis and/or consolidation, likely with superimposed small bilateral pleural effusions (right greater than left).  No evidence of pulmonary edema.  Heart size is within normal limits.  Upper mediastinal contours are unremarkable.  Atherosclerosis in the thoracic aorta. Extensive architectural distortion in the lung apices bilaterally (right greater than left), compatible with chronic scarring.  IMPRESSION: 1.  Support apparatus, as above. 2.  Slight worsening aeration throughout the lung bases bilaterally may reflect increasing areas of atelectasis and/or consolidation with increasing small bilateral pleural effusions (right greater than left). 3.  Atherosclerosis.   Original Report Authenticated By: Trudie Reed, M.D.    Dg Chest Port 1 View  04/19/2012  *RADIOLOGY REPORT*  Clinical Data: Airspace disease.  Check endotracheal tube.  PORTABLE CHEST - 1 VIEW  Comparison: 04/18/2012  Findings: Endotracheal tube is roughly 4.8 cm above the carina. Nasogastric tube extends into the abdomen.  Stable hyperinflation with chronic lung changes.  No significant change in the interstitial densities at the lung bases.  Cannot exclude small effusions. Heart size is stable.  IMPRESSION: Minimal change in the parenchymal lung disease.  Evidence for acute or chronic disease.  Support apparatuses as described.   Original Report Authenticated By: Richarda Overlie, M.D.     ASSESSMENT / PLAN:  PULMONARY  A:   Acute on chronic respiratory failure.   COPD - with exacerbation.   Resolving cavitary RUL pneumonia  P:   - Cont alb / atrovent / pulmicort - Cont to wean systemic steroids - plan for trach on 3/14   CARDIOVASCULAR A:  Chronic systolic CHF without evidence of exacerbation.   Possible cor pulmonale.   Hypotension  Resolved.  CAD  Hypertensive and tachycardic  3/12  P: -Monitor -Lopressor PT to keep SBP <160 -PRN hydralazine     RENAL  A:   Hyponatremia, chronic, resolved    AKI, resolved Hypovolemia resolved Hypomagnesemia , resolved Hypophosphatemia, resolved Hypokalemia - replaced 3/12 P:   F/u lytes, replace PRN Mag repleted 3/12 with follow-up in am   GASTROINTESTINAL  A:   Severe malnutrition.  Pulmonary cachexia.  Adult failure to thrive.   Elevated transaminases in setting of ETOH.   P:   -resume TF post re-intubation -Protonix for GI Px  HEMATOLOGIC   A:   Thrombocytopenia - resolved off heparin.  HIT neg Anemia critical illness Leukopenia, reolsved  P:  -Monitor CBC intermittently   INFECTIOUS  A:   Resolving pneumonia.   P:   -Cultures and antibiotics as above    ENDOCRINE   A:   Hypothermia, resolved   P:   -wean steroids slowly  -Monitor  CBG's   NEUROLOGIC A:   Acute encephalopathy - multifactorial.- Resolved 3/12   History of ETOH / cocaine abuse   P:   -Goal RASS 0 to -1 -continue precedex, PRN ativan, PRN fentanyl -Thiamine / Folate.   Kandice Robinsons, RN ACNP Student [employer]   [name], NP-C [place] [facility] Pgr: [phone] or [phone]    The patient is critically ill with multiple organ systems failure and requires high complexity decision making for assessment and support, frequent evaluation and titration of therapies, application of advanced monitoring technologies and extensive interpretation of multiple databases. Critical Care Time devoted to patient care services described in this note is 45 minutes.   [name], MD ; [facility] [phone].  After 5:30 PM or weekends, call [phone]    04/20/2012 10:46 AM

## 2012-04-20 NOTE — Progress Notes (Signed)
NUTRITION FOLLOW UP  Intervention:   Continue Jevity 1.2 @ 35 ml/hr via OG tube. 30 ml Prostat q 24hrs. At goal rate, tube feeding regimen will provide 1108 kcal, 62 grams of protein, and 760 ml of H2O.  Recommend 150 ml free water flushes q 4 hrs This meets 95% of estimated calorie needs and 100% of estimated protein needs.  Provide Multivitamin with minerals daily per tube due to tube feeding volume being less than 1L per 24 hrs.  Nutrition Dx:   Inadequate oral intake related to inability to eat as evidenced by NPO status and pt on vent; ongoing   Goal:   Pt to meet >/= 90% of their estimated nutrition needs; being met  Monitor:   TF tolerance: tolerating Wt; 17 lb wt increase since 3/5- question accuracy of weights Respiratory status; pt extubated and re-intubated today Magnesium, potassium, and phosphorus; Mg and K low, Phos WNL   Assessment:   57 yo with severe COPD, recent cavitary pneumonia and multiple recent hospitalizations for acute respiratory failure in setting of cocaine / alcohol abuse and medical noncompliance admitted 3/4 with acute respiratory failure, failed BiPAP, intubated. Per RN pt was extubated this morning and re-intubated. Tube feeds were discontinued but, will be re-initiated soon. Per nursing notes pt has 15 ml residuals at 4 AM- tolerating.  Height: Ht Readings from Last 1 Encounters:  04/16/12 5\' 1"  (1.549 m)    Weight Status:   Wt Readings from Last 1 Encounters:  04/20/12 97 lb 10.6 oz (44.3 kg)   Patient is currently intubated on ventilator support.  MV: 7.0 Temp:Temp (24hrs), Avg:98.2 F (36.8 C), Min:95.9 F (35.5 C), Max:99.9 F (37.7 C)  Using wt of 36.6 kg from 3/5 for calculations  Re-estimated needs:  Kcal: 1167 Protein: 55-75 grams Fluid: 1.7 L  Skin: +1 RUE, LUE, RLE, and LLE edema and skin tear at sacrum  Diet Order:     Intake/Output Summary (Last 24 hours) at 04/20/12 1705 Last data filed at 04/20/12 0900  Gross  per 24 hour  Intake 1074.7 ml  Output    801 ml  Net  273.7 ml    Last BM: 3/12   Labs:   Recent Labs Lab 04/16/12 0454 04/17/12 0535 04/18/12 0410 04/18/12 2230 04/19/12 0335 04/20/12 0340  NA 146* 145 144 143 142 140  K 4.1 3.7 3.8 3.5 3.5 3.2*  CL 112 109 107 101 99 99  CO2 30 32 33* 34* 36* 32  BUN 13 14 13 11 11 15   CREATININE 0.53 0.44* 0.44* 0.40* 0.37* 0.37*  CALCIUM 8.0* 7.9* 7.9* 7.8* 7.8* 8.0*  MG 2.1 1.7 2.0 1.4*  --   --   PHOS 1.8* 2.2* 2.5  --   --   --   GLUCOSE 155* 141* 160* 156* 156* 157*    CBG (last 3)   Recent Labs  04/20/12 0747 04/20/12 1127 04/20/12 1541  GLUCAP 146* 162* 108*    Scheduled Meds: . albuterol  2.5 mg Nebulization Q6H  . antiseptic oral rinse  1 application Mouth Rinse QID  . budesonide (PULMICORT) nebulizer solution  0.5 mg Nebulization BID  . chlorhexidine  15 mL Mouth/Throat BID  . docusate  100 mg Per Tube BID  . feeding supplement (JEVITY 1.2 CAL)  1,000 mL Per Tube Q24H  . folic acid  1 mg Intravenous Daily  . insulin aspart  0-9 Units Subcutaneous Q4H  . ipratropium  0.5 mg Nebulization Q6H  . levofloxacin (LEVAQUIN)  IV  500 mg Intravenous Q24H  . lidocaine (cardiac) 100 mg/25ml      . [START ON 04/21/2012] methylPREDNISolone (SOLU-MEDROL) injection  40 mg Intravenous Daily  . metoprolol tartrate  12.5 mg Oral BID  . pantoprazole sodium  40 mg Per Tube Daily  . rocuronium      . sodium chloride  3 mL Intravenous Q12H  . succinylcholine      . thiamine  100 mg Intravenous Daily    Continuous Infusions: . sodium chloride    . dexmedetomidine 0.7 mcg/kg/hr (04/20/12 1100)    Ian Malkin RD, LDN Inpatient Clinical Dietitian Pager: [phone] After Hours Pager: [phone]

## 2012-04-20 NOTE — Progress Notes (Signed)
eLink Physician-Brief Progress Note Patient Name: Jennifer Hurley DOB: [birth year] MRN: [id number]  Date of Service  04/20/2012   HPI/Events of Note  Hypokalemia   eICU Interventions  Potassium replaced   Intervention Category Minor Interventions: Electrolytes abnormality - evaluation and management  DETERDING,ELIZABETH 04/20/2012, 4:48 AM

## 2012-04-20 NOTE — Progress Notes (Signed)
Per Dr. Sung Amabile- extubate PT- was successfully completed and placed on 2 lpm [place]. Brandi, NP was aware of negative cuff leak but stated to extubate. Prior to extubation PT was suctioned orally and down endotracheal tube. Oral secretions  were moderate and thick, ETT secretions were very minimal. Post extubation PT was orally suctioned and placed on 2 lpm [place]- current Sp02 97%, RR 20, BBS faint expiratory wheeze in upper lobes and diminished in the bases.

## 2012-04-20 NOTE — Progress Notes (Signed)
Due to PT WOB, increased HR, Sp02 of less than 88%- RT placed PT on NIV- 12/6 , 40%. PT confirms that she is getting adequate air during inhalation and able to blow out on expiration. Current HR 105, Sp02 97%, and WOB has decreased slightly. RN aware PT is on NIV at this time.

## 2012-04-21 ENCOUNTER — Inpatient Hospital Stay (HOSPITAL_COMMUNITY): Payer: Medicare Other

## 2012-04-21 LAB — GLUCOSE, CAPILLARY
Glucose-Capillary: 115 mg/dL — ABNORMAL HIGH (ref 70–99)
Glucose-Capillary: 119 mg/dL — ABNORMAL HIGH (ref 70–99)

## 2012-04-21 LAB — BASIC METABOLIC PANEL
CO2: 32 mEq/L (ref 19–32)
Glucose, Bld: 104 mg/dL — ABNORMAL HIGH (ref 70–99)
Potassium: 4.2 mEq/L (ref 3.5–5.1)
Sodium: 138 mEq/L (ref 135–145)

## 2012-04-21 LAB — CULTURE, RESPIRATORY W GRAM STAIN

## 2012-04-21 LAB — CBC
Hemoglobin: 9.8 g/dL — ABNORMAL LOW (ref 12.0–15.0)
MCH: 27.8 pg (ref 26.0–34.0)
RBC: 3.52 MIL/uL — ABNORMAL LOW (ref 3.87–5.11)

## 2012-04-21 MED ORDER — LEVOFLOXACIN 500 MG PO TABS
500.0000 mg | ORAL_TABLET | ORAL | Status: DC
  Administered 2012-04-21: 500 mg
  Filled 2012-04-21 (×2): qty 1

## 2012-04-21 MED ORDER — DEXMEDETOMIDINE HCL IN NACL 400 MCG/100ML IV SOLN
0.2000 ug/kg/h | INTRAVENOUS | Status: DC
  Administered 2012-04-22: 0.7 ug/kg/h via INTRAVENOUS
  Filled 2012-04-21 (×2): qty 100

## 2012-04-21 MED ORDER — METOPROLOL TARTRATE 1 MG/ML IV SOLN
INTRAVENOUS | Status: AC
  Administered 2012-04-21: 5 mg via INTRAVENOUS
  Filled 2012-04-21: qty 5

## 2012-04-21 MED ORDER — METOPROLOL TARTRATE 1 MG/ML IV SOLN
5.0000 mg | INTRAVENOUS | Status: AC
  Filled 2012-04-21: qty 5

## 2012-04-21 MED ORDER — ETOMIDATE 2 MG/ML IV SOLN
40.0000 mg | Freq: Once | INTRAVENOUS | Status: DC
  Filled 2012-04-21: qty 20

## 2012-04-21 MED ORDER — METOPROLOL TARTRATE 25 MG PO TABS
25.0000 mg | ORAL_TABLET | Freq: Two times a day (BID) | ORAL | Status: DC
  Administered 2012-04-21 – 2012-04-23 (×4): 25 mg via ORAL
  Filled 2012-04-21 (×5): qty 1

## 2012-04-21 MED ORDER — MIDAZOLAM HCL 2 MG/2ML IJ SOLN
4.0000 mg | Freq: Once | INTRAMUSCULAR | Status: AC
  Administered 2012-04-22: 4 mg via INTRAVENOUS
  Filled 2012-04-21: qty 4

## 2012-04-21 MED ORDER — LEVOFLOXACIN 25 MG/ML PO SOLN: 500.0000 mg | Freq: Every day | ORAL | Status: DC

## 2012-04-21 MED ORDER — RANITIDINE HCL 150 MG/10ML PO SYRP
150.0000 mg | ORAL_SOLUTION | Freq: Every day | ORAL | Status: DC
  Administered 2012-04-22 – 2012-04-25 (×4): 150 mg
  Filled 2012-04-21 (×5): qty 10

## 2012-04-21 MED ORDER — VECURONIUM BROMIDE 10 MG IV SOLR
10.0000 mg | Freq: Once | INTRAVENOUS | Status: DC
  Filled 2012-04-21: qty 10

## 2012-04-21 MED ORDER — METOPROLOL TARTRATE 1 MG/ML IV SOLN
2.5000 mg | INTRAVENOUS | Status: DC | PRN
  Administered 2012-04-21: 5 mg via INTRAVENOUS
  Administered 2012-04-22 – 2012-04-25 (×3): 2.5 mg via INTRAVENOUS
  Filled 2012-04-21 (×4): qty 5

## 2012-04-21 MED ORDER — FENTANYL CITRATE 0.05 MG/ML IJ SOLN
200.0000 ug | Freq: Once | INTRAMUSCULAR | Status: AC
  Administered 2012-04-22: 100 ug via INTRAVENOUS
  Filled 2012-04-21: qty 4

## 2012-04-21 MED ORDER — FAMOTIDINE 40 MG/5ML PO SUSR
20.0000 mg | Freq: Every day | ORAL | Status: DC
  Filled 2012-04-21: qty 2.5

## 2012-04-21 NOTE — Progress Notes (Signed)
RT placed PT back on full support - due to increased MVe, tachycardia, increased BP- RN aware.

## 2012-04-21 NOTE — Clinical Social Work Note (Signed)
CSW continues to follow. No family present today. CSW made aware of trach tomorrow and CSW is planning on SNF placement for Pt. CSW discussed this possibility with son last week and he felt that would be needed as well.   [name], LCSW ICU/Stepdown Clinical Social Worker [facility] Cell [phone] Hours 8am-1200pm M-F

## 2012-04-21 NOTE — Progress Notes (Signed)
RT spoke with Hinton Lovely (RT [facility] Manager) to confirm this PT is on the schedule for bedside trach for 04/22/12 at 1300- she stated it is scheduled.

## 2012-04-21 NOTE — Progress Notes (Signed)
PULMONARY  / CRITICAL CARE MEDICINE  Name: Jennifer Hurley MRN: [id number] DOB: Apr 22, 1955    ADMISSION DATE:  04/12/2012 CONSULTATION DATE:  04/12/2012  REFERRING MD :  TRH  CHIEF COMPLAINT:  Acute respiratory failure  BRIEF PATIENT DESCRIPTION:  57 y/o with severe COPD, recent cavitary pneumonia and multiple recent hospitalizations for acute respiratory failure in setting of cocaine / alcohol abuse and medical noncompliance admitted 3/4 by [facility] with acute respiratory failure, failed BiPAP, intubated in ICU 3/4 pm and PCCM assumed care.      LINES / TUBES: ETT  3/4 >> 3/12,  3/12 >>  L [place] CVL 3/4 >> 04/17/12  R rad a line 3/4 >> out  CULTURES: 3/4 MRSA screen>>> neg 3/4  Blood >>>neg 3/4  Respiratory>>>mod group A strep pyogenes  ANTIBIOTICS: Levaquin 3/4 >>  SIGNIFICANT EVENTS / STUDIES:  3/06 - off pressors 3/07 - changed to continuous sedation due to breath stacking 3/08 - Sedated, breath stacking  3/09 - Significant cough tachycardia and hypertension on wake up assessment. 3/10 - Failed SBT, significant tachycardia. Diltiazem infusion initiated 3/11- Tachycardia much improved with low dose metoprolol. Diltiazem weaned to off 3/12- Extubated. Failed after several hrs due to excessive secretions, increased WOB. Re-intubated 3/13 - RASS 0. + F/C. Agrees to proceed with trach. Planned for 3/14  SUBJECTIVE/OVERNIGHT/INTERVAL HX    VITAL SIGNS: Temp:  [96.3 F (35.7 C)-98.8 F (37.1 C)] 98.8 F (37.1 C) (03/13 0900) Pulse Rate:  [85-123] 111 (03/13 0915) Resp:  [16-34] 26 (03/13 0900) BP: (101-190)/(70-128) 120/93 mmHg (03/13 0915) SpO2:  [93 %-100 %] 100 % (03/13 0900) FiO2 (%):  [30 %-40 %] 30 % (03/13 0853) Weight:  [37.8 kg (83 lb 5.3 oz)] 37.8 kg (83 lb 5.3 oz) (03/13 0501)  VENTILATOR SETTINGS: Vent Mode:  [-] CPAP;PSV FiO2 (%):  [30 %-40 %] 30 % Set Rate:  [16 bmp] 16 bmp Vt Set:  [400 mL] 400 mL PEEP:  [5 cmH20] 5 cmH20 Pressure Support:  [5 cmH20-10 cmH20]  10 cmH20 Plateau Pressure:  [13 cmH20-21 cmH20] 16 cmH20  INTAKE / OUTPUT: Intake/Output     03/12 0701 - 03/13 0700 03/13 0701 - 03/14 0700   I.V. (mL/kg) 969 (25.6) 57.5 (1.5)   NG/GT 390 70   IV Piggyback 100    Total Intake(mL/kg) 1459 (38.6) 127.5 (3.4)   Urine (mL/kg/hr) 2625 (2.9) 85 (0.7)   Stool     Total Output 2625 85   Net -1166 +42.5         PHYSICAL EXAMINATION: General:  Appears chronically ill, cachectic, older than stated age Neuro: Alert and following commands. HEENT:  PERRL, OETT/OGT, -thyromegally and - JVD. Large protruding tongue. Cardiovascular:  Tachycardic, regular S1 and S2, no murmurs, hypertensive.  Lungs:  Bilaterally diminished BS,  expiratory wheezes.  Accessory muscle use noted. Abdomen:  Soft, nontender, bowel sounds diminished. Musculoskeletal:  Moves all extremities, 1+ edema to upper extremities.Generalized weakness. Skin:  Intact.  LABS:   Recent Labs Lab 04/15/12 0430 04/16/12 0454 04/17/12 0535 04/18/12 0410 04/18/12 2230 04/19/12 0335 04/20/12 0340 04/21/12 0320  NA 138 146* 145 144 143 142 140 138  K 3.6 4.1 3.7 3.8 3.5 3.5 3.2* 4.2  CL 106 112 109 107 101 99 99 100  CO2 25 30 32 33* 34* 36* 32 32  GLUCOSE 138* 155* 141* 160* 156* 156* 157* 104*  BUN 10 13 14 13 11 11 15 15   CREATININE 0.52 0.53 0.44* 0.44* 0.40* 0.37* 0.37*  0.41*  CALCIUM 7.5* 8.0* 7.9* 7.9* 7.8* 7.8* 8.0* 8.2*  MG 2.3 2.1 1.7 2.0 1.4*  --   --  2.1  PHOS 1.9* 1.8* 2.2* 2.5  --   --   --  3.2    Recent Labs Lab 04/19/12 0335 04/20/12 0340 04/21/12 0320  HGB 9.6* 9.2* 9.8*  HCT 27.9* 27.0* 28.4*  WBC 6.6 6.2 9.1  PLT 205 202 214      Imaging x48 hours Dg Chest Port 1 View  04/21/2012  *RADIOLOGY REPORT*  Clinical Data: Endotracheal tube position  PORTABLE CHEST - 1 VIEW  Comparison: 04/20/2012  Findings: Endotracheal tube tip 1 cm above the carina.  NG tube descends below the level of the image.  Hyperinflation with interstitial coarsening.   Blunted costophrenic angles. Biapical architectural distortion/scarring.  Underlying nodule not excluded. Continued attention on follow-up. No pneumothorax.  Osteopenia.  IMPRESSION: Endotracheal tube tip 1 cm above the carina.  Recommend 2-3 cm of retraction.  COPD/emphysema.   Original Report Authenticated By: Jearld Lesch, M.D.    Dg Chest Port 1 View  04/20/2012  *RADIOLOGY REPORT*  Clinical Data: Sedation, pleural effusions.  PORTABLE CHEST - 1 VIEW  Comparison: 04/20/2012.  Findings: Endotracheal tube terminates approximately 1.5 cm above the carina.  Heart size normal.  There are changes of bullous emphysema with slight worsening in diffuse bilateral air space disease.  Biapical pleural parenchymal scarring.  Small bilateral pleural effusions.  IMPRESSION:  1.  Endotracheal tube is slightly low-lying.  Pulling back approximately 2 cm would better position the tip above the carina. 2.  Slight worsening in diffuse bilateral air space disease with small bilateral pleural effusions.   Original Report Authenticated By: Leanna Battles, M.D.    Dg Chest Port 1 View  04/20/2012  *RADIOLOGY REPORT*  Clinical Data: Evaluate endotracheal tube placement.  PORTABLE CHEST - 1 VIEW  Comparison: Chest x-ray 04/19/2012.  Findings: An endotracheal tube is in place with tip 2.3 cm above the carina. A nasogastric tube is seen extending into the stomach, however, the tip of the nasogastric tube extends below the lower margin of the image.  There are persistent and slightly increasing bibasilar opacities which may reflect areas of atelectasis and/or consolidation, likely with superimposed small bilateral pleural effusions (right greater than left).  No evidence of pulmonary edema.  Heart size is within normal limits.  Upper mediastinal contours are unremarkable.  Atherosclerosis in the thoracic aorta. Extensive architectural distortion in the lung apices bilaterally (right greater than left), compatible with chronic  scarring.  IMPRESSION: 1.  Support apparatus, as above. 2.  Slight worsening aeration throughout the lung bases bilaterally may reflect increasing areas of atelectasis and/or consolidation with increasing small bilateral pleural effusions (right greater than left). 3.  Atherosclerosis.   Original Report Authenticated By: Trudie Reed, M.D.     ASSESSMENT / PLAN:  PULMONARY  A:   Acute on chronic respiratory failure.   COPD - with exacerbation.   Resolving cavitary RUL pneumonia  P:   - Cont alb / atrovent / pulmicort - Cont to wean systemic steroids - plan for trach on 3/14 - Plan LTACH 3/17   CARDIOVASCULAR A:  Chronic systolic CHF without evidence of exacerbation.   Possible cor pulmonale.   Hypotension  Resolved.  CAD  Hypertension, resolved Tachycardia, persists but improved on metoprolol  P: -increase metoprolol -PRN hydralazine to keep SBP <160    RENAL  A:   Hyponatremia, chronic, prior concern for SIADH  AKI, resolved Hypovolemia resolved Hypomagnesemia , resolved Hypophosphatemia, resolved Hypokalemia, recurrent P:   Monitor Na off demeclocycline Monitor chemistries and correct accordingly   GASTROINTESTINAL  A:   Severe malnutrition.     Elevated transaminases in setting of ETOH.   P:   -Cont TFs -Protonix for GI Px  HEMATOLOGIC   A:   Thrombocytopenia - resolved.  HIT neg Anemia critical illness, mild. No evidence of acute blood los Leukopenia, resolved  P:  -Monitor CBC intermittently   INFECTIOUS A:   Resolving pneumonia.   P:   -Cultures and antibiotics as above    ENDOCRINE  A:   Hypothermia, resolved  Euglycemic P:   -D/C SSI and CBGs   NEUROLOGIC A:   Acute encephalopathy - Resolved 3/12   History of ETOH / cocaine abuse   P:   -Goal RASS 0 to -1 -continue precedex, PRN ativan, PRN fentanyl   The patient is critically ill with multiple organ systems failure and requires high complexity decision making  for assessment and support, frequent evaluation and titration of therapies, application of advanced monitoring technologies and extensive interpretation of multiple databases. Critical Care Time devoted to patient care services described in this note is 35 minutes.   [name], MD ; [facility] [phone].  After 5:30 PM or weekends, call [phone]    04/21/2012 10:09 AM

## 2012-04-22 ENCOUNTER — Inpatient Hospital Stay (HOSPITAL_COMMUNITY): Payer: Medicare Other

## 2012-04-22 LAB — BASIC METABOLIC PANEL
BUN: 11 mg/dL (ref 6–23)
CO2: 30 mEq/L (ref 19–32)
Glucose, Bld: 80 mg/dL (ref 70–99)
Potassium: 3.6 mEq/L (ref 3.5–5.1)
Sodium: 133 mEq/L — ABNORMAL LOW (ref 135–145)

## 2012-04-22 LAB — CBC
Hemoglobin: 8 g/dL — ABNORMAL LOW (ref 12.0–15.0)
MCH: 27.5 pg (ref 26.0–34.0)
MCV: 80.8 fL (ref 78.0–100.0)
RBC: 2.91 MIL/uL — ABNORMAL LOW (ref 3.87–5.11)

## 2012-04-22 MED ORDER — MIDAZOLAM HCL 2 MG/2ML IJ SOLN
INTRAMUSCULAR | Status: AC
  Filled 2012-04-22: qty 4

## 2012-04-22 MED ORDER — LIDOCAINE HCL (CARDIAC) 20 MG/ML IV SOLN
INTRAVENOUS | Status: AC
  Filled 2012-04-22: qty 5

## 2012-04-22 MED ORDER — ROCURONIUM BROMIDE 50 MG/5ML IV SOLN
INTRAVENOUS | Status: AC
  Filled 2012-04-22: qty 2

## 2012-04-22 MED ORDER — ETOMIDATE 2 MG/ML IV SOLN
INTRAVENOUS | Status: AC
  Filled 2012-04-22: qty 20

## 2012-04-22 MED ORDER — VECURONIUM BROMIDE 10 MG IV SOLR
INTRAVENOUS | Status: AC
  Filled 2012-04-22: qty 10

## 2012-04-22 MED ORDER — SODIUM CHLORIDE 0.9 % IV BOLUS (SEPSIS)
1000.0000 mL | Freq: Once | INTRAVENOUS | Status: AC
  Administered 2012-04-22: 1000 mL via INTRAVENOUS

## 2012-04-22 MED ORDER — SUCCINYLCHOLINE CHLORIDE 20 MG/ML IJ SOLN
INTRAMUSCULAR | Status: AC
  Filled 2012-04-22: qty 1

## 2012-04-22 NOTE — Plan of Care (Signed)
Problem: Phase I Progression Outcomes Goal: Tracheostomy by Vent Day 14 Outcome: Completed/Met Date Met:  04/22/12 For bedside trach placement 04/22/12

## 2012-04-22 NOTE — Procedures (Signed)
Bronchoscopy Procedure Note Jennifer Hurley [id number] 1955-08-05  Procedure: Bronchoscopy Indications: Tracheostomy Placement / Airway Assessment  Procedure Details Consent: Risks of procedure as well as the alternatives and risks of each were explained to the (patient/caregiver).  Consent for procedure obtained. Time Out: Verified patient identification, verified procedure, site/side was marked, verified correct patient position, special equipment/implants available, medications/allergies/relevent history reviewed, required imaging and test results available.  Performed  In preparation for procedure, patient was given 100% FiO2 and bronchoscope lubricated. Sedation: Etomidate  Airway entered and the following bronchi were examined: proximal airways without lesion.  Procedures performed: Brushings performed Bronchoscope removed.    Evaluation Hemodynamic Status: Transient hypotension treated with fluid; O2 sats: stable throughout Patient's Current Condition: stable Specimens:  None Complications: No apparent complications Patient did tolerate procedure well.   Jennifer Hurley [facility] Pulmonary & Critical Care [phone] 04/22/2012

## 2012-04-22 NOTE — Progress Notes (Signed)
Pt hypotensive, MD called. Conley Rolls, RN

## 2012-04-22 NOTE — Clinical Social Work Note (Signed)
CSW spoke with Pt's son, [name] over the phone to check in and discuss LTAC options on behalf of RN CM. Pt understands LTAC level of care, but will need assistance explaining this to the rest of the family. CSW explained this further and will get RN CM to meet with him this afternoon. [name] reports he is coping adequately at this time and plans to come check on his mother this afternoon.   [name], LCSW ICU/Stepdown Clinical Social Worker [facility] Cell [phone] Hours 8am-1200pm M-F

## 2012-04-22 NOTE — Procedures (Signed)
Percutaneous Tracheostomy Procedure Note  Consent from family, patient sedated and paralyzed, area cleaned, lidocaine/epi injected, skin incision done under sterile condition followed by skin dissection.  Airway entered and visualized bronchoscopically.  Airway crushed and dilated.  Trach placed and visualized above carina bronchoscopically.  CXR ordered and pending.  Alyson Reedy, M.D. [facility] Pulmonary/Critical Care Medicine. Pager: [phone]. After hours pager: [phone].

## 2012-04-22 NOTE — Progress Notes (Signed)
PULMONARY  / CRITICAL CARE MEDICINE  Name: Jennifer Hurley MRN: [id number] DOB: 03-26-1955    ADMISSION DATE:  04/12/2012 CONSULTATION DATE:  04/12/2012  REFERRING MD :  TRH  CHIEF COMPLAINT:  Acute respiratory failure  BRIEF PATIENT DESCRIPTION:  57 y/o with severe COPD, recent cavitary pneumonia and multiple recent hospitalizations for acute respiratory failure in setting of cocaine / alcohol abuse and medical noncompliance admitted 3/4 by [facility] with acute respiratory failure, failed BiPAP, intubated in ICU 3/4 pm and PCCM assumed care.      LINES / TUBES: ETT  3/4 >> 3/12,  3/12 >> 3/14 L [place] CVL 3/4 >> 04/17/12  R rad a line 3/4 >> out Trach [name] >>     CULTURES: 3/4 MRSA screen>>> neg 3/4  Blood >>>neg 3/4  Respiratory>>>mod group A strep pyogenes  ANTIBIOTICS: Levaquin 3/4 >>3/14  SIGNIFICANT EVENTS / STUDIES:  3/06 - off pressors 3/07 - changed to continuous sedation due to breath stacking 3/08 - Sedated, breath stacking  3/09 - Significant cough tachycardia and hypertension on wake up assessment. 3/10 - Failed SBT, significant tachycardia. Diltiazem infusion initiated 3/11- Tachycardia much improved with low dose metoprolol. Diltiazem weaned to off 3/12- Extubated. Failed after several hrs due to excessive secretions, increased WOB. Re-intubated 3/13 - RASS 0. + F/C. Agrees to proceed with trach. Planned for 3/14 3/14 - Trach placed without complications  SUBJECTIVE:  No new problems. Trach placed without complications  VITAL SIGNS: Temp:  [98.1 F (36.7 C)-99.9 F (37.7 C)] 98.6 F (37 C) (03/14 0900) Pulse Rate:  [86-149] 111 (03/14 0800) Resp:  [16-35] 23 (03/14 0900) BP: (81-169)/(53-114) 121/79 mmHg (03/14 0900) SpO2:  [96 %-100 %] 97 % (03/14 0904) FiO2 (%):  [30 %] 30 % (03/14 0905)  VENTILATOR SETTINGS: Vent Mode:  [-] PRVC FiO2 (%):  [30 %] 30 % Set Rate:  [16 bmp] 16 bmp Vt Set:  [400 mL] 400 mL PEEP:  [5 cmH20] 5 cmH20 Pressure Support:  [10  cmH20] 10 cmH20 Plateau Pressure:  [12 cmH20-16 cmH20] 15 cmH20  INTAKE / OUTPUT: Intake/Output     03/13 0701 - 03/14 0700 03/14 0701 - 03/15 0700   I.V. (mL/kg) 797.7 (21.1)    NG/GT 810    IV Piggyback     Total Intake(mL/kg) 1607.7 (42.5)    Urine (mL/kg/hr) 2150 (2.4)    Total Output 2150     Net -542.3           PHYSICAL EXAMINATION: General:  Cachectic, NAD Neuro: diffusely weak, no focal deficits HEENT: Large tongue, poor dentition, o/w no abn Cardiovascular:  RRR s M.  Lungs: No wheeze Abdomen:  Soft, NT, NABS Ext: No edema   LABS:   Recent Labs Lab 04/16/12 0454 04/17/12 0535 04/18/12 0410 04/18/12 2230 04/19/12 0335 04/20/12 0340 04/21/12 0320 04/22/12 0345  NA 146* 145 144 143 142 140 138 133*  K 4.1 3.7 3.8 3.5 3.5 3.2* 4.2 3.6  CL 112 109 107 101 99 99 100 97  CO2 30 32 33* 34* 36* 32 32 30  GLUCOSE 155* 141* 160* 156* 156* 157* 104* 80  BUN 13 14 13 11 11 15 15 11   CREATININE 0.53 0.44* 0.44* 0.40* 0.37* 0.37* 0.41* 0.38*  CALCIUM 8.0* 7.9* 7.9* 7.8* 7.8* 8.0* 8.2* 8.1*  MG 2.1 1.7 2.0 1.4*  --   --  2.1  --   PHOS 1.8* 2.2* 2.5  --   --   --  3.2  --  Recent Labs Lab 04/20/12 0340 04/21/12 0320 04/22/12 0345  HGB 9.2* 9.8* 8.0*  HCT 27.0* 28.4* 23.5*  WBC 6.2 9.1 9.2  PLT 202 214 243      Imaging x48 hours Dg Chest Port 1 View  04/21/2012  *RADIOLOGY REPORT*  Clinical Data: Endotracheal tube position  PORTABLE CHEST - 1 VIEW  Comparison: 04/20/2012  Findings: Endotracheal tube tip 1 cm above the carina.  NG tube descends below the level of the image.  Hyperinflation with interstitial coarsening.  Blunted costophrenic angles. Biapical architectural distortion/scarring.  Underlying nodule not excluded. Continued attention on follow-up. No pneumothorax.  Osteopenia.  IMPRESSION: Endotracheal tube tip 1 cm above the carina.  Recommend 2-3 cm of retraction.  COPD/emphysema.   Original Report Authenticated By: Jearld Lesch, M.D.    Dg  Chest Port 1 View  04/20/2012  *RADIOLOGY REPORT*  Clinical Data: Sedation, pleural effusions.  PORTABLE CHEST - 1 VIEW  Comparison: 04/20/2012.  Findings: Endotracheal tube terminates approximately 1.5 cm above the carina.  Heart size normal.  There are changes of bullous emphysema with slight worsening in diffuse bilateral air space disease.  Biapical pleural parenchymal scarring.  Small bilateral pleural effusions.  IMPRESSION:  1.  Endotracheal tube is slightly low-lying.  Pulling back approximately 2 cm would better position the tip above the carina. 2.  Slight worsening in diffuse bilateral air space disease with small bilateral pleural effusions.   Original Report Authenticated By: Leanna Battles, M.D.     ASSESSMENT / PLAN:  PULMONARY  A:   Acute on chronic respiratory failure.   COPD - with exacerbation.   Resolving cavitary RUL pneumonia   P:   - Cont triple nebs - off systemic steroids - Plan [facility] 3/17   CARDIOVASCULAR A:  Chronic systolic CHF, no edema on CXR   Possible cor pulmonale.   Hypotension  Resolved.  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     Elevated transaminases in setting of ETOH.   P:   - Cont TFs, on hold for trach - Protonix for GI Px  HEMATOLOGIC   A:   Thrombocytopenia - resolved.  HIT neg Anemia critical illness - mild. No evidence of acute blood los Leukopenia, resolved  P:  - Monitor CBC intermittently   INFECTIOUS A:   Resolving pneumonia.   P:   - Cultures and antibiotics as above     ENDOCRINE  A:   Hypothermia, resolved  No active issues  P:     NEUROLOGIC A:   Acute encephalopathy - Resolved 3/12   History of ETOH / cocaine abuse   P:   -Goal RASS 0 to -1 -continue precedex, PRN ativan, PRN fentanyl  Can likely D/C precedex 3/15 after trach   The patient is critically ill with multiple organ systems failure and requires high complexity decision making for assessment and support, frequent evaluation and titration of therapies, application of advanced monitoring technologies and extensive interpretation of multiple databases. Critical Care Time devoted to patient care services described in this note is 30 minutes.    [name], MD ; PCCM  service Mobile [phone].  After 5:30 PM or weekends, call [phone]   04/22/2012 10:01 AM

## 2012-04-22 NOTE — Procedures (Signed)
Bedside Tracheostomy Insertion Procedure Note   Patient Details:   Name: Jennifer Hurley DOB: Jun 12, 1955 MRN: [id number]  Procedure: Tracheostomy  Pre Procedure Assessment: ET Tube Size: ET Tube secured at lip (cm): Bite block in place: No Pt has no teeth Breath Sounds: Diminished  Post Procedure Assessment: BP 137/98  Pulse 101  Temp(Src) 99.3 F (37.4 C) (Core (Comment))  Resp 23  Ht 5\' 1"  (1.549 m)  Wt 83 lb 5.3 oz (37.8 kg)  BMI 15.75 kg/m2  SpO2 100% O2 sats: stable throughout Complications: No apparent complications Patient did tolerate procedure well Tracheostomy Brand:[place] Tracheostomy Style:Cuffed Tracheostomy Size: 6.0 Tracheostomy Secured NWG:[id number] Tracheostomy Placement Confirmation:Trach cuff visualized and in place    Kendall Flack Royal 04/22/2012, 2:10 PM

## 2012-04-22 NOTE — Significant Event (Signed)
Pt hypotensive.  Will give fluid bolus and monitor.  Coralyn Helling, MD [facility] Pulmonary/Critical Care 04/22/2012, 8:46 PM

## 2012-04-22 NOTE — Progress Notes (Signed)
[id number] Earlene Plater, RN, BSN, CCM:  CHART REVIEWED AND UPDATED.  PATIENT IS SCHEDULED TO GO [facility] ON [id number]  Next chart review due on [id number]. NO DISCHARGE NEEDS PRESENT AT THIS TIME. CASE MANAGEMENT [phone]

## 2012-04-23 ENCOUNTER — Inpatient Hospital Stay (HOSPITAL_COMMUNITY): Payer: Medicare Other

## 2012-04-23 DIAGNOSIS — F1010 Alcohol abuse, uncomplicated: Secondary | ICD-10-CM

## 2012-04-23 DIAGNOSIS — J189 Pneumonia, unspecified organism: Secondary | ICD-10-CM

## 2012-04-23 DIAGNOSIS — J441 Chronic obstructive pulmonary disease with (acute) exacerbation: Secondary | ICD-10-CM

## 2012-04-23 LAB — BASIC METABOLIC PANEL
BUN: 8 mg/dL (ref 6–23)
Creatinine, Ser: 0.32 mg/dL — ABNORMAL LOW (ref 0.50–1.10)
GFR calc Af Amer: >90 mL/min (ref >90)
GFR calc non Af Amer: >90 mL/min (ref >90)
Glucose, Bld: 106 mg/dL — ABNORMAL HIGH (ref 70–99)

## 2012-04-23 LAB — CBC
HCT: 26.8 % — ABNORMAL LOW (ref 36.0–46.0)
Hemoglobin: 9.5 g/dL — ABNORMAL LOW (ref 12.0–15.0)
MCH: 28 pg (ref 26.0–34.0)
MCHC: 35.4 g/dL (ref 30.0–36.0)
MCV: 79.1 fL (ref 78.0–100.0)
RDW: 19.3 % — ABNORMAL HIGH (ref 11.5–15.5)

## 2012-04-23 LAB — URINALYSIS, ROUTINE W REFLEX MICROSCOPIC
Glucose, UA: NEGATIVE mg/dL
Ketones, ur: NEGATIVE mg/dL
Protein, ur: NEGATIVE mg/dL
Urobilinogen, UA: 1 mg/dL (ref 0.0–1.0)

## 2012-04-23 LAB — URINE MICROSCOPIC-ADD ON

## 2012-04-23 MED ORDER — POTASSIUM CHLORIDE 20 MEQ/15ML (10%) PO LIQD
40.0000 meq | Freq: Once | ORAL | Status: AC
  Administered 2012-04-23: 40 meq
  Filled 2012-04-23: qty 30

## 2012-04-23 MED ORDER — SODIUM CHLORIDE 0.9 % IV SOLN
25.0000 ug/h | INTRAVENOUS | Status: DC
  Administered 2012-04-23: 50 ug/h via INTRAVENOUS
  Administered 2012-04-23: 75 ug/h via INTRAVENOUS
  Administered 2012-04-23: 25 ug/h via INTRAVENOUS
  Administered 2012-04-24: 125 ug/h via INTRAVENOUS
  Administered 2012-04-24: 150 ug/h via INTRAVENOUS
  Filled 2012-04-23 (×3): qty 50

## 2012-04-23 MED ORDER — FENTANYL BOLUS VIA INFUSION
25.0000 ug | Freq: Four times a day (QID) | INTRAVENOUS | Status: DC | PRN
  Filled 2012-04-23: qty 100

## 2012-04-23 MED ORDER — FENTANYL CITRATE 0.05 MG/ML IJ SOLN: 200.0000 ug | Freq: Once | INTRAMUSCULAR | Status: DC

## 2012-04-23 MED ORDER — SODIUM CHLORIDE 0.9 % IV BOLUS (SEPSIS)
500.0000 mL | Freq: Once | INTRAVENOUS | Status: AC
  Administered 2012-04-23: 500 mL via INTRAVENOUS

## 2012-04-23 NOTE — Progress Notes (Signed)
Hypotension on precedex;   Transition to fentanyl drip; monitor BP; consider fluid.

## 2012-04-23 NOTE — Progress Notes (Signed)
Pt hypotensive, [facility] aware. Conley Rolls, RN

## 2012-04-23 NOTE — Progress Notes (Signed)
Fever   Respiratory culture, urine culture and BC ordered;   No antibiotics ordered currently; low threshold to initiate broad spectrum ATBx for suspicion of HAP

## 2012-04-23 NOTE — Progress Notes (Signed)
Hypokalemia   K replaced  

## 2012-04-23 NOTE — Progress Notes (Signed)
PULMONARY  / CRITICAL CARE MEDICINE  Name: Jennifer Hurley MRN: [id number] DOB: 15-Feb-1955    ADMISSION DATE:  04/12/2012 CONSULTATION DATE:  04/12/2012  REFERRING MD :  TRH  CHIEF COMPLAINT:  Acute respiratory failure  BRIEF PATIENT DESCRIPTION:  57 y/o with severe COPD, recent cavitary pneumonia and multiple recent hospitalizations for acute respiratory failure in setting of cocaine / alcohol abuse and medical noncompliance admitted 3/4 by [facility] with acute respiratory failure, failed BiPAP, intubated in ICU 3/4 pm and PCCM assumed care.      LINES / TUBES: ETT  3/4 >> 3/12,  3/12 >> 3/14 L [place] CVL 3/4 >> 04/17/12  R rad a line 3/4 >> out Trach Jennifer Hurley) 3/14 >>     CULTURES: 3/4 MRSA screen>>> neg 3/4  Blood >>>neg 3/4  Respiratory>>>mod group A strep pyogenes  ANTIBIOTICS: Levaquin 3/4 >>3/14  SIGNIFICANT EVENTS / STUDIES:  3/06 - off pressors 3/07 - changed to continuous sedation due to breath stacking 3/08 - Sedated, breath stacking  3/09 - Significant cough tachycardia and hypertension on wake up assessment. 3/10 - Failed SBT, significant tachycardia. Diltiazem infusion initiated 3/11- Tachycardia much improved with low dose metoprolol. Diltiazem weaned to off 3/12- Extubated. Failed after several hrs due to excessive secretions, increased WOB. Re-intubated 3/13 - RASS 0. + F/C. Agrees to proceed with trach. Planned for 3/14 3/14 - Trach placed without complications  SUBJECTIVE:   S/p trach 3/14 Tolerating PSV 5 currently  VITAL SIGNS: Temp:  [97.5 F (36.4 C)-102 F (38.9 C)] 100 F (37.8 C) (03/15 1000) Pulse Rate:  [29-142] 94 (03/15 1000) Resp:  [12-33] 12 (03/15 1000) BP: (34-211)/(15-149) 131/54 mmHg (03/15 1000) SpO2:  [93 %-100 %] 99 % (03/15 1000) FiO2 (%):  [30 %-100 %] 30 % (03/15 0815)  VENTILATOR SETTINGS: Vent Mode:  [-] CPAP FiO2 (%):  [30 %-100 %] 30 % Set Rate:  [16 bmp] 16 bmp Vt Set:  [400 mL] 400 mL PEEP:  [5 cmH20] 5 cmH20 Pressure Support:   [5 cmH20] 5 cmH20 Plateau Pressure:  [14 cmH20-20 cmH20] 14 cmH20  INTAKE / OUTPUT: Intake/Output     03/14 0701 - 03/15 0700 03/15 0701 - 03/16 0700   I.V. (mL/kg) 1508.5 (39.9) 88.3 (2.3)   Other 300    NG/GT 420 225   Total Intake(mL/kg) 2228.5 (59) 313.3 (8.3)   Urine (mL/kg/hr) 2375 (2.6) 250 (1.9)   Total Output 2375 250   Net -146.5 +63.3         PHYSICAL EXAMINATION: General:  Cachectic, NAD Neuro: diffusely weak, no focal deficits HEENT: Huge protruding tongue, poor dentition, trach in place CDI Cardiovascular:  RRR s M.  Lungs: No wheeze Abdomen:  Soft, NT, NABS Ext: No edema   LABS:   Recent Labs Lab 04/17/12 0535 04/18/12 0410 04/18/12 2230 04/19/12 0335 04/20/12 0340 04/21/12 0320 04/22/12 0345 04/23/12 0410  NA 145 144 143 142 140 138 133* 129*  K 3.7 3.8 3.5 3.5 3.2* 4.2 3.6 3.1*  CL 109 107 101 99 99 100 97 95*  CO2 32 33* 34* 36* 32 32 30 25  GLUCOSE 141* 160* 156* 156* 157* 104* 80 106*  BUN 14 13 11 11 15 15 11 8   CREATININE 0.44* 0.44* 0.40* 0.37* 0.37* 0.41* 0.38* 0.32*  CALCIUM 7.9* 7.9* 7.8* 7.8* 8.0* 8.2* 8.1* 7.6*  MG 1.7 2.0 1.4*  --   --  2.1  --   --   PHOS 2.2* 2.5  --   --   --  3.2  --   --     Recent Labs Lab 04/21/12 0320 04/22/12 0345 04/23/12 0410  HGB 9.8* 8.0* 9.5*  HCT 28.4* 23.5* 26.8*  WBC 9.1 9.2 13.8*  PLT 214 243 238      Imaging x48 hours Dg Chest Port 1 View  04/23/2012  *RADIOLOGY REPORT*  Clinical Data: Tracheostomy.  Airspace disease.  PORTABLE CHEST - 1 VIEW  Comparison: One-view chest 04/22/2012  Findings: A tracheostomy tube is stable.  A feeding tube courses off the inferior border of the film.  Diffuse emphysematous changes are noted.  There is increasing opacification at the right lung apex.  Left apical densities are stable.  IMPRESSION:  1.  Increasing right apical opacity.  This may represent a worsening pneumonia or slight edema superimposed on scarring and unresolved pneumonia. 2.  Severe  emphysema. 3.  Stable scarring at the left apex.   Original Report Authenticated By: Marin Roberts, M.D.    Chest Portable 1 View To Assess Tube Placement And Rule-out Pneumothorax  04/22/2012  *RADIOLOGY REPORT*  Clinical Data: Tracheostomy tube and NG tube placement.  PORTABLE CHEST - 1 VIEW  Comparison: 04/21/2012  Findings: The cardiomediastinal silhouette is unremarkable. A tracheostomy tube is in placed and appears in satisfactory position. Small bore feeding tube is identified entering the stomach with tip off the field of view.  Slightly improved bilateral interstitial and airspace opacities noted. Tiny bilateral pleural effusions again noted. There is no evidence of pneumothorax. Biapical pleuroparenchymal scarring and COPD/emphysema again noted.  IMPRESSION: Slightly improved bilateral interstitial airspace opacities/edema.  Tracheostomy tube and small bore feeding tube as described.  Unchanged tiny bilateral pleural effusions, biapical pleuroparenchymal scarring and changes of COPD/emphysema.   Original Report Authenticated By: Harmon Pier, M.D.     ASSESSMENT / PLAN:  PULMONARY  A:   Acute on chronic respiratory failure.   COPD - with exacerbation.   Resolving cavitary RUL pneumonia  ? Contribution of her enlarged tongue to potential resp compromise P:   - Cont triple nebs - off systemic steroids - she likely needs ENT eval to assess tongue for trauma - consider possible angioedema of tongue ??  - goal move to ATC quickly - Plan [facility] 3/17   CARDIOVASCULAR A:  Chronic systolic CHF, no edema on CXR   Possible cor pulmonale.   Hypotension  Resolved.  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     Elevated transaminases in setting of ETOH.  P:   - Cont TFs, were on hold for trach - Protonix for GI Px  HEMATOLOGIC   A:   Thrombocytopenia - resolved.  HIT neg Anemia critical illness - mild. No evidence of acute blood los Leukopenia, resolved P:  - Monitor CBC intermittently   INFECTIOUS A:   Resolving pneumonia.  P:   - Cultures and antibiotics as above    ENDOCRINE  A:   Hypothermia, resolved  No active issues P:    NEUROLOGIC A:   Acute encephalopathy - Resolved 3/12   History of ETOH / cocaine abuse   P:   -  prn sedation   The patient is critically ill with multiple organ systems failure and requires high complexity decision making for assessment and support, frequent evaluation and titration of therapies, application of advanced monitoring technologies and extensive interpretation of multiple databases. Critical Care Time devoted to patient care services described in this note is 30 minutes.

## 2012-04-24 LAB — BASIC METABOLIC PANEL
BUN: 6 mg/dL (ref 6–23)
CO2: 31 mEq/L (ref 19–32)
Calcium: 8.2 mg/dL — ABNORMAL LOW (ref 8.4–10.5)
Creatinine, Ser: 0.34 mg/dL — ABNORMAL LOW (ref 0.50–1.10)
GFR calc Af Amer: >90 mL/min (ref >90)

## 2012-04-24 NOTE — Progress Notes (Signed)
PULMONARY  / CRITICAL CARE MEDICINE  Name: Jennifer Hurley MRN: [id number] DOB: 07/20/1955    ADMISSION DATE:  04/12/2012 CONSULTATION DATE:  04/12/2012  REFERRING MD :  TRH  CHIEF COMPLAINT:  Acute respiratory failure  BRIEF PATIENT DESCRIPTION:  57 y/o with severe COPD, recent cavitary pneumonia and multiple recent hospitalizations for acute respiratory failure in setting of cocaine / alcohol abuse and medical noncompliance admitted 3/4 by [facility] with acute respiratory failure, failed BiPAP, intubated in ICU 3/4 pm and PCCM assumed care.     LINES / TUBES: ETT  3/4 >> 3/12,  3/12 >> 3/14 L [place] CVL 3/4 >> 04/17/12  R rad a line 3/4 >> out Trach Jennifer Hurley) 3/14 >>   CULTURES: 3/4 MRSA screen>>> neg 3/4  Blood >>>neg 3/4  Respiratory>>>mod group A strep pyogenes  ANTIBIOTICS: Levaquin 3/4 >>3/14  SIGNIFICANT EVENTS / STUDIES:  3/06 - off pressors 3/07 - changed to continuous sedation due to breath stacking 3/08 - Sedated, breath stacking  3/09 - Significant cough tachycardia and hypertension on wake up assessment. 3/10 - Failed SBT, significant tachycardia. Diltiazem infusion initiated 3/11- Tachycardia much improved with low dose metoprolol. Diltiazem weaned to off 3/12- Extubated. Failed after several hrs due to excessive secretions, increased WOB. Re-intubated 3/13 - RASS 0. + F/C. Agrees to proceed with trach. Planned for 3/14 3/14 - Trach placed without complications  SUBJECTIVE:   S/p trach 3/14 Tolerating PSV 5 Fever overnight 3/15  VITAL SIGNS: Temp:  [99.3 F (37.4 C)-101.3 F (38.5 C)] 99.5 F (37.5 C) (03/16 0900) Pulse Rate:  [86-132] 126 (03/16 0900) Resp:  [9-17] 17 (03/16 0900) BP: (85-134)/(46-77) 105/61 mmHg (03/16 0900) SpO2:  [92 %-100 %] 92 % (03/16 0900) FiO2 (%):  [30 %] 30 % (03/16 0800) Weight:  [36.1 kg (79 lb 9.4 oz)] 36.1 kg (79 lb 9.4 oz) (03/16 0318)  VENTILATOR SETTINGS: Vent Mode:  [-] PRVC FiO2 (%):  [30 %] 30 % Set Rate:  [16 bmp] 16 bmp Vt  Set:  [400 mL] 400 mL PEEP:  [5 cmH20] 5 cmH20 Pressure Support:  [5 cmH20] 5 cmH20 Plateau Pressure:  [12 cmH20-14 cmH20] 14 cmH20  INTAKE / OUTPUT: Intake/Output     03/15 0701 - 03/16 0700 03/16 0701 - 03/17 0700   I.V. (mL/kg) 660.8 (18.3)    Other     NG/GT 1285    Total Intake(mL/kg) 1945.8 (53.9)    Urine (mL/kg/hr) 1925 (2.2)    Total Output 1925     Net +20.8           PHYSICAL EXAMINATION: General:  Cachectic, NAD Neuro: diffusely weak, no focal deficits HEENT: Huge protruding tongue with apparent lower bite injury, poor dentition, trach in place CDI, purulent nasal drainage Cardiovascular:  RRR s M.  Lungs: No wheeze Abdomen:  Soft, NT, NABS Ext: No edema   LABS:   Recent Labs Lab 04/18/12 0410 04/18/12 2230  04/20/12 0340 04/21/12 0320 04/22/12 0345 04/23/12 0410 04/24/12 0353  NA 144 143  < > 140 138 133* 129* 129*  K 3.8 3.5  < > 3.2* 4.2 3.6 3.1* 4.3  CL 107 101  < > 99 100 97 95* 93*  CO2 33* 34*  < > 32 32 30 25 31   GLUCOSE 160* 156*  < > 157* 104* 80 106* 101*  BUN 13 11  < > 15 15 11 8 6   CREATININE 0.44* 0.40*  < > 0.37* 0.41* 0.38* 0.32* 0.34*  CALCIUM 7.9*  7.8*  < > 8.0* 8.2* 8.1* 7.6* 8.2*  MG 2.0 1.4*  --   --  2.1  --   --   --   PHOS 2.5  --   --   --  3.2  --   --   --   < > = values in this interval not displayed.  Recent Labs Lab 04/21/12 0320 04/22/12 0345 04/23/12 0410  HGB 9.8* 8.0* 9.5*  HCT 28.4* 23.5* 26.8*  WBC 9.1 9.2 13.8*  PLT 214 243 238      Imaging x48 hours Dg Chest Port 1 View  04/23/2012  *RADIOLOGY REPORT*  Clinical Data: Tracheostomy.  Airspace disease.  PORTABLE CHEST - 1 VIEW  Comparison: One-view chest 04/22/2012  Findings: A tracheostomy tube is stable.  A feeding tube courses off the inferior border of the film.  Diffuse emphysematous changes are noted.  There is increasing opacification at the right lung apex.  Left apical densities are stable.  IMPRESSION:  1.  Increasing right apical opacity.  This  may represent a worsening pneumonia or slight edema superimposed on scarring and unresolved pneumonia. 2.  Severe emphysema. 3.  Stable scarring at the left apex.   Original Report Authenticated By: Jennifer Hurley, M.D.    Chest Portable 1 View To Assess Tube Placement And Rule-out Pneumothorax  04/22/2012  *RADIOLOGY REPORT*  Clinical Data: Tracheostomy tube and NG tube placement.  PORTABLE CHEST - 1 VIEW  Comparison: 04/21/2012  Findings: The cardiomediastinal silhouette is unremarkable. A tracheostomy tube is in placed and appears in satisfactory position. Small bore feeding tube is identified entering the stomach with tip off the field of view.  Slightly improved bilateral interstitial and airspace opacities noted. Tiny bilateral pleural effusions again noted. There is no evidence of pneumothorax. Biapical pleuroparenchymal scarring and COPD/emphysema again noted.  IMPRESSION: Slightly improved bilateral interstitial airspace opacities/edema.  Tracheostomy tube and small bore feeding tube as described.  Unchanged tiny bilateral pleural effusions, biapical pleuroparenchymal scarring and changes of COPD/emphysema.   Original Report Authenticated By: Jennifer Hurley, M.D.     ASSESSMENT / PLAN:  PULMONARY A:   Acute on chronic respiratory failure.   COPD - with exacerbation.   Resolving cavitary RUL pneumonia  ? Contribution of her enlarged tongue to potential resp compromise P:   - Cont triple nebs - off systemic steroids - Apparent tongue trauma lower side of mid tongue. ? Whether she needs some sort of lower bite block to protect the tongue. No apparent infection or ischemia. Doubt ENT eval will add much at this time - goal move to ATC quickly - Plan [facility] 3/17   CARDIOVASCULAR A:  Chronic systolic CHF, no edema on CXR   Possible cor pulmonale.   Hypotension  Resolved.  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     Elevated transaminases in setting of ETOH.  P:   - Cont TFs, were on hold for trach - Protonix for GI Px  HEMATOLOGIC A:   Thrombocytopenia - resolved.  HIT neg Anemia critical illness - mild. No evidence of  acute blood los Leukopenia, resolved P:  - Monitor CBC intermittently  INFECTIOUS A:   Resolving pneumonia.  New fever 3/15 > ? Possible sinusitis at risk with NGT and purulent nasal secretions P:   - abx for PNA completed - cx resent 3/16 for fever - consider CT scan sinuses to guide further abx.    ENDOCRINE  A:   Hypothermia, resolved  No active issues P:    NEUROLOGIC A:   Acute encephalopathy - Resolved 3/12   History of ETOH / cocaine abuse  P:   -prn sedation   The patient is critically ill with multiple organ systems failure and requires high complexity decision making for assessment and support, frequent evaluation and titration of therapies, application of advanced monitoring technologies and extensive interpretation of multiple databases. Critical Care Time devoted to patient care services described in this note is 30 minutes.   Levy Pupa, MD, PhD 04/24/2012, 10:06 AM [facility] [phone] or if no answer [phone]

## 2012-04-25 ENCOUNTER — Inpatient Hospital Stay
Admission: AD | Admit: 2012-04-25 | Discharge: 2012-06-02 | Disposition: A | Discharge status: Skilled Nursing Facility | Payer: Medicare Other | Source: Ambulatory Visit | Attending: Internal Medicine | Admitting: Internal Medicine

## 2012-04-25 DIAGNOSIS — Z930 Tracheostomy status: Secondary | ICD-10-CM

## 2012-04-25 DIAGNOSIS — Z720 Tobacco use: Secondary | ICD-10-CM | POA: Diagnosis present

## 2012-04-25 DIAGNOSIS — J9601 Acute respiratory failure with hypoxia: Secondary | ICD-10-CM | POA: Diagnosis present

## 2012-04-25 DIAGNOSIS — J189 Pneumonia, unspecified organism: Secondary | ICD-10-CM | POA: Diagnosis not present

## 2012-04-25 DIAGNOSIS — J439 Emphysema, unspecified: Secondary | ICD-10-CM | POA: Diagnosis present

## 2012-04-25 DIAGNOSIS — J441 Chronic obstructive pulmonary disease with (acute) exacerbation: Secondary | ICD-10-CM | POA: Diagnosis present

## 2012-04-25 DIAGNOSIS — J9600 Acute respiratory failure, unspecified whether with hypoxia or hypercapnia: Secondary | ICD-10-CM

## 2012-04-25 DIAGNOSIS — J9611 Chronic respiratory failure with hypoxia: Secondary | ICD-10-CM

## 2012-04-25 DIAGNOSIS — E43 Unspecified severe protein-calorie malnutrition: Secondary | ICD-10-CM | POA: Diagnosis present

## 2012-04-25 LAB — CULTURE, BLOOD (ROUTINE X 2): Culture: NO GROWTH

## 2012-04-25 LAB — URINE CULTURE
Colony Count: NO GROWTH
Culture: NO GROWTH

## 2012-04-25 MED ORDER — LORAZEPAM 2 MG/ML IJ SOLN: 0.5000 mg | INTRAMUSCULAR | Status: DC | PRN

## 2012-04-25 MED ORDER — BUDESONIDE 0.5 MG/2ML IN SUSP: 0.5000 mg | Freq: Two times a day (BID) | RESPIRATORY_TRACT | Status: DC

## 2012-04-25 MED ORDER — RANITIDINE HCL 150 MG/10ML PO SYRP: 150.0000 mg | ORAL_SOLUTION | Freq: Every day | ORAL | Status: DC

## 2012-04-25 MED ORDER — BISACODYL 10 MG RE SUPP: 10.0000 mg | Freq: Every day | RECTAL | Status: DC | PRN

## 2012-04-25 MED ORDER — CHLORHEXIDINE GLUCONATE 0.12 % MT SOLN: 15.0000 mL | Freq: Two times a day (BID) | OROMUCOSAL | Status: DC

## 2012-04-25 MED ORDER — ADULT MULTIVITAMIN W/MINERALS CH: 1.0000 | ORAL_TABLET | Freq: Every day | ORAL | Status: DC

## 2012-04-25 MED ORDER — ALBUTEROL SULFATE (5 MG/ML) 0.5% IN NEBU: 2.5000 mg | INHALATION_SOLUTION | Freq: Four times a day (QID) | RESPIRATORY_TRACT | Status: DC

## 2012-04-25 MED ORDER — SODIUM CHLORIDE 0.9 % IV SOLN: INTRAVENOUS | Status: DC

## 2012-04-25 MED ORDER — HYDRALAZINE HCL 20 MG/ML IJ SOLN: 10.0000 mg | INTRAMUSCULAR | Status: DC | PRN

## 2012-04-25 MED ORDER — JEVITY 1.2 CAL PO LIQD: 1000.0000 mL | ORAL | Status: DC

## 2012-04-25 MED ORDER — BIOTENE DRY MOUTH MT LIQD: 1.0000 "application " | Freq: Four times a day (QID) | OROMUCOSAL | Status: DC

## 2012-04-25 MED ORDER — FENTANYL BOLUS VIA INFUSION: 25.0000 ug | INTRAVENOUS | Status: DC | PRN

## 2012-04-25 MED ORDER — DOCUSATE SODIUM 50 MG/5ML PO LIQD: 100.0000 mg | Freq: Two times a day (BID) | ORAL | Status: DC

## 2012-04-25 MED ORDER — SODIUM CHLORIDE 0.9 % IJ SOLN: 3.0000 mL | Freq: Two times a day (BID) | INTRAMUSCULAR | Status: DC

## 2012-04-25 MED ORDER — ACETAMINOPHEN 160 MG/5ML PO SOLN: 650.0000 mg | ORAL | Status: DC | PRN

## 2012-04-25 MED ORDER — METOPROLOL TARTRATE 1 MG/ML IV SOLN: 2.5000 mg | INTRAVENOUS | Status: DC | PRN

## 2012-04-25 MED ORDER — IPRATROPIUM BROMIDE 0.02 % IN SOLN: 0.5000 mg | Freq: Four times a day (QID) | RESPIRATORY_TRACT | Status: DC

## 2012-04-25 NOTE — Discharge Summary (Signed)
Physician Discharge Summary     Patient ID: Jennifer Hurley MRN: [id number] DOB/AGE: 1955/06/27 57 y.o.  Admit date: 04/12/2012 Discharge date: 04/25/2012  Admission Diagnoses: Acute respiratory failure  Discharge Diagnoses:  Active Problems:   Systolic CHF, chronic   Chronic respiratory failure with hypoxia   Severe protein-calorie malnutrition   Alcohol abuse   Acute respiratory failure   COPD exacerbation   PSVT (paroxysmal supraventricular tachycardia)   Agitation   Delirium   Significant Hospital tests/ studies/ interventions and procedures  LINES / TUBES:  ETT 3/4 >> 3/12, 3/12 >> 3/14  L [place] CVL 3/4 >> 04/17/12  R rad a line 3/4 >> out  Trach [name] >>   CULTURES:  3/4 MRSA screen>>> neg  3/4 Blood >>>neg  3/4 Respiratory>>>mod group A strep pyogenes  ANTIBIOTICS:  Levaquin 3/4 >>>3/12  SIGNIFICANT EVENTS / STUDIES:  3/06 - off pressors  3/07 - changed to continuous sedation due to breath stacking  3/08 - Sedated, breath stacking  3/09 - Significant cough tachycardia and hypertension on wake up assessment.  3/10 - Failed SBT, significant tachycardia. Diltiazem infusion initiated  3/11- Tachycardia much improved with low dose metoprolol. Diltiazem weaned to off  3/12- Extubated. Failed after several hrs due to excessive secretions, increased WOB. Re-intubated  3/13 - RASS 0. + F/C. Agrees to proceed with trach. Planned for 3/14  3/14 - Trach placed without complications   BRIEF PATIENT DESCRIPTION:   57 y/o with severe COPD, recent cavitary pneumonia and multiple recent hospitalizations for acute respiratory failure in setting of cocaine / alcohol abuse and medical noncompliance admitted 3/4 by [facility] with acute respiratory failure, failed BiPAP, intubated in ICU 3/4 pm and PCCM assumed care.   Hospital Course:  1) Acute on chronic respiratory failure.  2) COPD - with exacerbation.  3) Resolving cavitary RUL pneumonia  4) ? Contribution of her enlarged  tongue to potential resp compromise   Admitted to hospital on 3/4 with failure to thrive and poor po intake for several days at home. On arrival found to be hypothermic and met SIRS criteria. Initially admitted to medical service. Developed progressive increase in work of breathing. Required intubation after failing NIPPV. PCCM asked to evaluate on the evening of admit for progressive respiratory failure which required intubation. She was treated in usual fashion with mechanical ventilation, systemic steroids, inhaled bronchodilators, and empiric antibiotics. Her course was complicated by agitation and biting her ETT as well as her tongue. She developed increased tongue swelling on 3/12. This likely decreased her ability to protect her airway and resulted in increased oral secretions. This was felt to be the cause of her failed extubation trial on 3/12. Because of this we went ahead with trach placement on 3/14. It is our hope that the trach should facilitate her tongue healing. We have weaned systemic steroids off. At time of discharge she has now just started ATC trials. She should transition quickly. To ATC 24/7 P:  - Cont triple nebs  - initiate weaning efforts per [facility] protocol - off abx  - rest mode:  Vent Mode:  [-] PRVC FiO2 (%):  [30 %-60 %] 60 % Set Rate:  [16 bmp] 16 bmp Vt Set:  [400 mL] 400 mL PEEP:  [5 cmH20] 5 cmH20 Plateau Pressure:  [12 cmH20-16 cmH20] 12 cmH20 5) Chronic systolic CHF - without evidence of exacerbation.  6) Possible core pulmonale.  7) Hypotension  - likely secondary to hypovolemia. Resolved.  8) CAD. - euvolemic  off pressors 3/7  P:  -Monitor  -Mean arterial pressure goal greater than 60  9) Hyponatremia, chronic, resolved  Suspected SIADH.  10) AKI.  11) fluid and electrolyte imbalance: Hypovolemia resolved  Hypomagnesemia,Hypophosphatemia , hypokalemia   D/C demeclocycline  Treated in usual fashion with volume supportive measures.  P: F/u lytes,  replace PRN  12)Severe malnutrition. Pulmonary cachexia. Adult failure to thrive.  Elevated transaminases in setting of ETOH.  At risk for refeeding syndrome. Acute hepatitis panel with B surface ab positive but ag negative.  P:  -TF started 3/5.  -Nutritionist consult appreciated, TF tolerated.  -Protonix for GI Px    13)Thrombocytopenia  14) Anemia critical illness  15) Leukopenia  P:  -PRBC for hemoglobin less than 7 g percent  -SCD while awaiting HIT panel (improved off heparin) 3/6   16) Acute encephalopathy - multifactorial.  History of ETOH / cocaine abuse - but clean drug screen on admission. At risk for withdrawal. Previous Psychiatric evaluations and questions of decision making capacity.  P:  -Goal RASS 0 to -1  -Changed to continuous sedation for air trapping 3/7  -add risperdal, attempt to wean gtts  -Thiamine / Folate.   Dispo: After recovery from respiratory failure would strongly consider SNF placement as she has not been able to care for self adequately.  Discharge Exam: BP 106/68  Pulse 113  Temp(Src) 99.9 F (37.7 C) (Core (Comment))  Resp 17  Ht 5\' 1"  (1.549 m)  Wt 36.1 kg (79 lb 9.4 oz)  BMI 15.05 kg/m2  SpO2 100%   PHYSICAL EXAMINATION:  General: Cachectic, NAD  Neuro: diffusely weak, no focal deficits  HEENT: Huge protruding tongue with apparent lower bite injury, poor dentition, trach in place CDI, purulent nasal drainage  Cardiovascular: RRR s M.  Lungs: scattered rhonchi  Abdomen: Soft, NT, NABS  Ext: No edema   Labs at discharge Lab Results  Component Value Date   CREATININE 0.34* 04/24/2012   BUN 6 04/24/2012   NA 129* 04/24/2012   K 4.3 04/24/2012   CL 93* 04/24/2012   CO2 31 04/24/2012   Lab Results  Component Value Date   WBC 13.8* 04/23/2012   HGB 9.5* 04/23/2012   HCT 26.8* 04/23/2012   MCV 79.1 04/23/2012   PLT 238 04/23/2012   Lab Results  Component Value Date   ALT 25 04/18/2012   AST 53* 04/18/2012   ALKPHOS 47 04/18/2012     BILITOT 0.4 04/18/2012   Lab Results  Component Value Date   INR 0.99 04/22/2012   INR 0.98 04/14/2012   INR 1.40 10/28/2011    Current radiology studies No results found.  Disposition:  06-Home-Health Care Svc      Discharge Orders   Future Orders Complete By Expires     Diet - low sodium heart healthy  As directed     Increase activity slowly  As directed         Medication List    STOP taking these medications       demeclocycline 150 MG tablet  Commonly known as:  DECLOMYCIN     Fluticasone-Salmeterol 250-50 MCG/DOSE Aepb  Commonly known as:  ADVAIR DISKUS     guaiFENesin-dextromethorphan 100-10 MG/5ML syrup  Commonly known as:  ROBITUSSIN DM     tiotropium 18 MCG inhalation capsule  Commonly known as:  SPIRIVA      TAKE these medications       acetaminophen 160 MG/5ML solution  Commonly known as:  TYLENOL  Place 20.3 mLs (650 mg total) into feeding tube every 4 (four) hours as needed for fever.     albuterol (5 MG/ML) 0.5% nebulizer solution  Commonly known as:  PROVENTIL  Take 0.5 mLs (2.5 mg total) by nebulization every 6 (six) hours.     antiseptic oral rinse Liqd  15 mLs by Mouth Rinse route QID.     bisacodyl 10 MG suppository  Commonly known as:  DULCOLAX  Place 1 suppository (10 mg total) rectally daily as needed.     budesonide 0.5 MG/2ML nebulizer solution  Commonly known as:  PULMICORT  Take 2 mLs (0.5 mg total) by nebulization 2 (two) times daily.     chlorhexidine 0.12 % solution  Commonly known as:  PERIDEX  Use as directed 15 mLs in the mouth or throat 2 (two) times daily.     docusate 50 MG/5ML liquid  Commonly known as:  COLACE  Place 10 mLs (100 mg total) into feeding tube 2 (two) times daily.     feeding supplement (JEVITY 1.2 CAL) Liqd  Place 1,000 mLs into feeding tube daily.     fentaNYL Soln  Commonly known as:  SUBLIMAZE  Inject 25-100 mcg into the vein every 30 (thirty) minutes as needed (to achieve analgesia).      hydrALAZINE 20 MG/ML injection  Commonly known as:  APRESOLINE  Inject 0.5-2 mLs (10-40 mg total) into the vein every 4 (four) hours as needed (for SBP >160).     ipratropium 0.02 % nebulizer solution  Commonly known as:  ATROVENT  Take 2.5 mLs (0.5 mg total) by nebulization every 6 (six) hours.     LORazepam 2 MG/ML injection  Commonly known as:  ATIVAN  Inject 0.25-0.5 mLs (0.5-1 mg total) into the vein every 4 (four) hours as needed for anxiety (prn anxiety).     metoprolol 1 MG/ML injection  Commonly known as:  LOPRESSOR  Inject 2.5-5 mLs (2.5-5 mg total) into the vein every 3 (three) hours as needed (to maintain HR<115).     multivitamin with minerals Tabs  Place 1 tablet into feeding tube daily.     ranitidine 150 MG/10ML syrup  Commonly known as:  ZANTAC  Place 10 mLs (150 mg total) into feeding tube daily.     sodium chloride 0.9 % infusion  kvo     sodium chloride 0.9 % injection  Inject 3 mLs into the vein every 12 (twelve) hours.     sodium chloride 0.9 % SOLN 200 mL with fentaNYL 0.05 MG/ML SOLN 2,500 mcg  Wean for sedation         Discharged Condition: fair  Physician Statement:   The Patient was personally examined, the discharge assessment and plan has been personally reviewed and I agree with ACNP Babcock's assessment and plan. > 30 minutes of time have been dedicated to discharge assessment, planning and discharge instructions.   SignedAnders Simmonds 04/25/2012, 10:22 AM   Levy Pupa, MD, PhD 04/25/2012, 10:29 AM [facility] [phone] or if no answer [phone]

## 2012-04-25 NOTE — Clinical Social Work Note (Signed)
CSW met briefly with son, as he came to get questions answered about d/c to [facility] today. [name], son coping adequately and has good support to assist him. CSW signing off as no other CSW needs noted currently.   [name], LCSW ICU/Stepdown Clinical Social Worker [facility] Cell [phone] Hours 8am-1200pm M-F

## 2012-04-25 NOTE — Progress Notes (Signed)
Pt placed on trach collar trial per Anders Simmonds, NP- 60%, no distress at this time.  RT will monitor.

## 2012-04-26 ENCOUNTER — Other Ambulatory Visit (HOSPITAL_COMMUNITY): Payer: Self-pay

## 2012-04-26 LAB — CBC
Hemoglobin: 8.5 g/dL — ABNORMAL LOW (ref 12.0–15.0)
MCH: 28 pg (ref 26.0–34.0)
MCV: 80.3 fL (ref 78.0–100.0)
RBC: 3.04 MIL/uL — ABNORMAL LOW (ref 3.87–5.11)
WBC: 9.5 10*3/uL (ref 4.0–10.5)

## 2012-04-26 LAB — BASIC METABOLIC PANEL
CO2: 32 mEq/L (ref 19–32)
Calcium: 8.4 mg/dL (ref 8.4–10.5)
Chloride: 91 mEq/L — ABNORMAL LOW (ref 96–112)
Glucose, Bld: 54 mg/dL — ABNORMAL LOW (ref 70–99)
Sodium: 133 mEq/L — ABNORMAL LOW (ref 135–145)

## 2012-04-26 LAB — PREALBUMIN: Prealbumin: 5.7 mg/dL — ABNORMAL LOW (ref 17.0–34.0)

## 2012-04-26 NOTE — Consult Note (Signed)
PULMONARY  / CRITICAL CARE MEDICINE  Name: Jennifer Hurley MRN: [id number] DOB: [birth year]    ADMISSION DATE:  04/25/2012 CONSULTATION DATE: 3-18   REFERRING MD :  [facility]  CHIEF COMPLAINT:  VDRF  BRIEF PATIENT DESCRIPTION:  Admitted to hospital on 3/4 with failure to thrive and poor po intake for several days at home. On arrival found to be hypothermic and met SIRS criteria. Initially admitted to medical service. Developed progressive increase in work of breathing. Required intubation after failing NIPPV. PCCM asked to evaluate on the evening of admit for progressive respiratory failure which required intubation. She was treated in usual fashion with mechanical ventilation, systemic steroids, inhaled bronchodilators, and empiric antibiotics. Her course was complicated by agitation and biting her ETT as well as her tongue. She developed increased tongue swelling on 3/12. This likely decreased her ability to protect her airway and resulted in increased oral secretions. This was felt to be the cause of her failed extubation trial on 3/12. Because of this we went ahead with trach placement on 3/14. It is our hope that the trach should facilitate her tongue healing. We have weaned systemic steroids off. At time of transfer to ssh on 3-17 she has now just started ATC trials. She should transition quickly. To ATC 24/7. PCCM will follow on [facility] as of 3-18.    PAST MEDICAL HISTORY :  Past Medical History  Diagnosis Date  . Rheumatoid arthritis   . Bronchitis   . Myocardial infarction   . Pneumonia   . COPD (chronic obstructive pulmonary disease)   . Chronic respiratory failure   . Hyponatremia   . Alcohol abuse   . Cocaine abuse   . Tobacco abuse    Past Surgical History  Procedure Laterality Date  . Tonsillectomy    . Tubal ligation     Prior to Admission medications   Medication Sig Start Date End Date Taking? Authorizing Provider  acetaminophen (TYLENOL) 160 MG/5ML solution Place 20.3 mLs  (650 mg total) into feeding tube every 4 (four) hours as needed for fever. 04/25/12   Simonne Martinet, NP  albuterol (PROVENTIL) (5 MG/ML) 0.5% nebulizer solution Take 0.5 mLs (2.5 mg total) by nebulization every 6 (six) hours. 04/25/12   Simonne Martinet, NP  antiseptic oral rinse (BIOTENE) LIQD 15 mLs by Mouth Rinse route QID. 04/25/12   Simonne Martinet, NP  bisacodyl (DULCOLAX) 10 MG suppository Place 1 suppository (10 mg total) rectally daily as needed. 04/25/12   Simonne Martinet, NP  budesonide (PULMICORT) 0.5 MG/2ML nebulizer solution Take 2 mLs (0.5 mg total) by nebulization 2 (two) times daily. 04/25/12   Simonne Martinet, NP  chlorhexidine (PERIDEX) 0.12 % solution Use as directed 15 mLs in the mouth or throat 2 (two) times daily. 04/25/12   Simonne Martinet, NP  docusate (COLACE) 50 MG/5ML liquid Place 10 mLs (100 mg total) into feeding tube 2 (two) times daily. 04/25/12   Simonne Martinet, NP  fentaNYL (SUBLIMAZE) SOLN Inject 25-100 mcg into the vein every 30 (thirty) minutes as needed (to achieve analgesia). 04/25/12   Simonne Martinet, NP  hydrALAZINE (APRESOLINE) 20 MG/ML injection Inject 0.5-2 mLs (10-40 mg total) into the vein every 4 (four) hours as needed (for SBP >160). 04/25/12   Simonne Martinet, NP  ipratropium (ATROVENT) 0.02 % nebulizer solution Take 2.5 mLs (0.5 mg total) by nebulization every 6 (six) hours. 04/25/12   Simonne Martinet, NP  LORazepam (ATIVAN) 2 MG/ML injection  Inject 0.25-0.5 mLs (0.5-1 mg total) into the vein every 4 (four) hours as needed for anxiety (prn anxiety). 04/25/12   Simonne Martinet, NP  metoprolol (LOPRESSOR) 1 MG/ML injection Inject 2.5-5 mLs (2.5-5 mg total) into the vein every 3 (three) hours as needed (to maintain HR<115). 04/25/12   Simonne Martinet, NP  Multiple Vitamin (MULTIVITAMIN WITH MINERALS) TABS Place 1 tablet into feeding tube daily. 04/25/12   Simonne Martinet, NP  Nutritional Supplements (FEEDING SUPPLEMENT, JEVITY 1.2 CAL,) LIQD Place 1,000 mLs into  feeding tube daily. 04/25/12   Simonne Martinet, NP  ranitidine (ZANTAC) 150 MG/10ML syrup Place 10 mLs (150 mg total) into feeding tube daily. 04/25/12   Simonne Martinet, NP  sodium chloride 0.9 % infusion kvo 04/25/12   Simonne Martinet, NP  sodium chloride 0.9 % injection Inject 3 mLs into the vein every 12 (twelve) hours. 04/25/12   Simonne Martinet, NP  sodium chloride 0.9 % SOLN 200 mL with fentaNYL 0.05 MG/ML SOLN 2,500 mcg Wean for sedation 04/25/12   Simonne Martinet, NP   No Known Allergies  FAMILY HISTORY:  Family History  Problem Relation Age of Onset  . Coronary artery disease    . Diabetes type II     SOCIAL HISTORY:  reports that she has been smoking Cigarettes.  She has a 39 pack-year smoking history. She has never used smokeless tobacco. She reports that  drinks alcohol. She reports that she uses illicit drugs (Cocaine and Marijuana).  REVIEW OF SYSTEMS:  na  SUBJECTIVE:   VITAL SIGNS: Temp:  [100 F (37.8 C)-101.7 F (38.7 C)] 101.7 F (38.7 C) (03/17 1520) Pulse Rate:  [109-118] 116 (03/17 1500) Resp:  [11-16] 16 (03/17 1500) BP: (129-160)/(74-107) 160/107 mmHg (03/17 1500) SpO2:  [100 %] 100 % (03/17 1515) FiO2 (%):  [40 %] 40 % (03/17 1515) HEMODYNAMICS:   VENTILATOR SETTINGS: Vent Mode:  [-]  FiO2 (%):  [40 %] 40 % INTAKE / OUTPUT: Intake/Output   None     PHYSICAL EXAMINATION: General:  Wasted frail AAF Neuro:  Follows commands weakly HEENT:  Trach, edematous tongue Cardiovascular:  hsd Lungs:  Decreased bs bases Abdomen:  +bs Musculoskeletal:  Wasted  Skin:  warm  LABS:  Recent Labs Lab 04/23/12 0410 04/24/12 0353 04/26/12 0450  NA 129* 129* 133*  K 3.1* 4.3 3.2*  CL 95* 93* 91*  CO2 25 31 32  BUN 8 6 5*  CREATININE 0.32* 0.34* 0.36*  GLUCOSE 106* 101* 54*    Recent Labs Lab 04/22/12 0345 04/23/12 0410 04/26/12 0450  HGB 8.0* 9.5* 8.5*  HCT 23.5* 26.8* 24.4*  WBC 9.2 13.8* 9.5  PLT 243 238 276    Recent Labs Lab  04/20/12 1541 04/20/12 2028 04/20/12 2331 04/21/12 0352 04/21/12 0810  GLUCAP 108* 115* 119* 105* 91    Imaging: Dg Abd Portable 1v  04/26/2012  *RADIOLOGY REPORT*  Clinical Data: Enteric tube placement.  PORTABLE ABDOMEN - 1 VIEW  Comparison: 04/26/2012 at 0343 hours  Findings: Interval advancement of enteric tube.  The tip is now in the right upper quadrant consistent with location in the distal stomach.  Examination is otherwise unchanged.  IMPRESSION: Enteric tube tip is in the right upper quadrant consistent with location in the distal stomach.   Original Report Authenticated By: Burman Nieves, M.D.    Dg Abd Portable 1v  04/26/2012  *RADIOLOGY REPORT*  Clinical Data: NG placement.  Second image obtained after advancement of  tube.  PORTABLE ABDOMEN - 1 VIEW  Comparison: None.  Findings: Enteric tube is positioned at the EG junction, likely in the distal esophagus.  Positioning is unchanged between the two images.  Emphysematous changes and scarring in the lung bases. Mild prominence of gas filled colon suggesting ileus.  IMPRESSION: Enteric tube placed with tip at the EG junction, likely in the distal esophagus.   Original Report Authenticated By: Burman Nieves, M.D.      ASSESSMENT  1) Acute on chronic respiratory failure.  2) COPD - with resolving exacerbation.  3) Resolved cavitary RUL pneumonia  4) tracheostomy status after recurrent vent dependence 5) Severe glossomegaly - presumably after tongue biting  Plan: 1) ATC as tolerated 2) Needs physical rehab and nutrition 3) Agree with ENT eval  Brett Canales Minor ACNP [facility] PCCM Pager [phone] till 3 pm If no answer page [phone] 04/26/2012, 11:14 AM  I have interviewed and examined the patient and reviewed the database. I have formulated the assessment and plan as reflected in the note above with amendments made by me.   Discussed with Dr Stanton Kidney  Rec: transition off fentanyl infusion to intermittent fentanyl. Might  benefit from Duragesic patch. Needs Rehab  No decannulation until physical status improves substantially and until glossomegaly improves  [name], MD;  PCCM service; Mobile [phone]

## 2012-04-27 LAB — PROCALCITONIN: Procalcitonin: 3.04 ng/mL

## 2012-04-28 LAB — RENAL FUNCTION PANEL
GFR calc Af Amer: >90 mL/min (ref >90)
Glucose, Bld: 113 mg/dL — ABNORMAL HIGH (ref 70–99)
Phosphorus: 2.8 mg/dL (ref 2.3–4.6)
Potassium: 3.9 mEq/L (ref 3.5–5.1)
Sodium: 132 mEq/L — ABNORMAL LOW (ref 135–145)

## 2012-04-28 LAB — CBC
Hemoglobin: 7.5 g/dL — ABNORMAL LOW (ref 12.0–15.0)
MCHC: 34.9 g/dL (ref 30.0–36.0)
RBC: 2.63 MIL/uL — ABNORMAL LOW (ref 3.87–5.11)

## 2012-04-28 LAB — VANCOMYCIN, TROUGH: Vancomycin Tr: 7.6 ug/mL — ABNORMAL LOW (ref 10.0–20.0)

## 2012-04-29 ENCOUNTER — Other Ambulatory Visit (HOSPITAL_COMMUNITY): Payer: Self-pay

## 2012-04-29 ENCOUNTER — Telehealth: Payer: Self-pay | Admitting: *Deleted

## 2012-04-29 DIAGNOSIS — J9620 Acute and chronic respiratory failure, unspecified whether with hypoxia or hypercapnia: Secondary | ICD-10-CM

## 2012-04-29 LAB — CULTURE, BLOOD (ROUTINE X 2)
Culture: NO GROWTH
Culture: NO GROWTH

## 2012-04-29 NOTE — Telephone Encounter (Signed)
This was suppose to go to [place]

## 2012-04-29 NOTE — Telephone Encounter (Signed)
Message copied by Macy Mis on Fri Apr 29, 2012 10:07 AM ------      Message from: HATCHER, JEFFREY C      Created: Fri Apr 29, 2012  9:35 AM       Rob did you get called about this pt? Asher Muir, can you set her up for f/u appt? ------

## 2012-04-29 NOTE — Progress Notes (Signed)
PULMONARY  / CRITICAL CARE MEDICINE  Name: Jennifer Hurley MRN: [id number] DOB: [birth year]    ADMISSION DATE:  04/25/2012 CONSULTATION DATE: 3-18   REFERRING MD :  [facility]  CHIEF COMPLAINT:  VDRF  BRIEF PATIENT DESCRIPTION:  Admitted to hospital on 3/4 with failure to thrive and poor po intake for several days at home. On arrival found to be hypothermic and met SIRS criteria. Initially admitted to medical service. Developed progressive increase in work of breathing. Required intubation after failing NIPPV. PCCM asked to evaluate on the evening of admit for progressive respiratory failure which required intubation. She was treated in usual fashion with mechanical ventilation, systemic steroids, inhaled bronchodilators, and empiric antibiotics. Her course was complicated by agitation and biting her ETT as well as her tongue. She developed increased tongue swelling on 3/12. This likely decreased her ability to protect her airway and resulted in increased oral secretions. This was felt to be the cause of her failed extubation trial on 3/12. Because of this we went ahead with trach placement on 3/14. It is our hope that the trach should facilitate her tongue healing. We have weaned systemic steroids off. At time of transfer to ssh on 3-17 she has now just started ATC trials. She should transition quickly. To ATC 24/7. PCCM will follow on [facility] as of 3-18.    SUBJECTIVE:   VITAL SIGNS: Vital signs reviewed. Abnormal values will appear under impression plan section.    PHYSICAL EXAMINATION: General:  Wasted frail AAF, on t collar Neuro:  Follows commands weakly HEENT:  Trach, edematous tongue Cardiovascular:  hsd Lungs:  Decreased bs bases Abdomen:  +bs Musculoskeletal:  Wasted  Skin:  warm  LABS:  Recent Labs Lab 04/24/12 0353 04/26/12 0450 04/28/12 0856  NA 129* 133* 132*  K 4.3 3.2* 3.9  CL 93* 91* 89*  CO2 31 32 38*  BUN 6 5* 5*  CREATININE 0.34* 0.36* 0.27*  GLUCOSE 101* 54* 113*     Recent Labs Lab 04/23/12 0410 04/26/12 0450 04/28/12 0856  HGB 9.5* 8.5* 7.5*  HCT 26.8* 24.4* 21.5*  WBC 13.8* 9.5 6.3  PLT 238 276 260   No results found for this basename: GLUCAP,  in the last 168 hours  Imaging: Dg Chest Port 1 View  04/29/2012  *RADIOLOGY REPORT*  Clinical Data: 57 year old female with respiratory distress. Tracheostomy.  PORTABLE CHEST - 1 VIEW  Comparison: 04/26/2012 and earlier.  Findings: Portable AP view at 0649 hours.  Stable tracheostomy. Stable visualized enteric feeding tube.  And 04/23/2012, increased bibasilar patchy and confluent opacity and increased patchy. Nodular and patchy right upper lobe opacity also appears mildly increased.  No pneumothorax or pulmonary edema.  Stable left PICC line.  Stable cardiac size and mediastinal contours.  Probable small pleural effusions.  IMPRESSION: 1. Stable lines and tubes. 2.  Progressive bibasilar opacity since 04/23/2012.  Stable to mildly progressed patchy and nodular right upper lobe opacity. 3.  Probable small pleural effusions.   Original Report Authenticated By: Erskine Speed, M.D.      ASSESSMENT  1) Acute on chronic respiratory failure.  2) COPD - with resolving exacerbation.  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

## 2012-04-29 NOTE — Progress Notes (Signed)
PULMONARY  / CRITICAL CARE MEDICINE  Name: Jennifer Hurley MRN: [id number] DOB: 10-15-1955    ADMISSION DATE:  04/25/2012 CONSULTATION DATE: 3-18   REFERRING MD :  [facility]  CHIEF COMPLAINT:  VDRF  BRIEF PATIENT DESCRIPTION:  Admitted to hospital on 3/4 with FTT, weakness, AECOPD. Required intubation after failing NIPPV. She was treated with mechanical ventilation, systemic steroids, inhaled bronchodilators, and empiric antibiotics. Her course was complicated by agitation and biting her ETT as well as her tongue. She developed increased tongue swelling on 3/12. This was felt to be the cause of her failed extubation trial on 3/12. Underwent trach 3/14. At time of transfer to ssh on 3-17 she had begun ATC trials.  PCCM following for trach/pulm mgmt   SUBJECTIVE:  NSC  VITAL SIGNS: Vital signs reviewed. Abnormal values will appear under impression plan section.    PHYSICAL EXAMINATION: General:  Cachectic, frail  Neuro:  Follows commands weakly HEENT:  Trach, edematous tongue Cardiovascular:  hsd Lungs:  Decreased bs bases Abdomen:  +bs Ext: no edema Skin:  warm  LABS:  Recent Labs Lab 04/24/12 0353 04/26/12 0450 04/28/12 0856  NA 129* 133* 132*  K 4.3 3.2* 3.9  CL 93* 91* 89*  CO2 31 32 38*  BUN 6 5* 5*  CREATININE 0.34* 0.36* 0.27*  GLUCOSE 101* 54* 113*    Recent Labs Lab 04/23/12 0410 04/26/12 0450 04/28/12 0856  HGB 9.5* 8.5* 7.5*  HCT 26.8* 24.4* 21.5*  WBC 13.8* 9.5 6.3  PLT 238 276 260   No results found for this basename: GLUCAP,  in the last 168 hours  Imaging: Dg Chest Port 1 View  04/29/2012  *RADIOLOGY REPORT*  Clinical Data: 58 year old female with respiratory distress. Tracheostomy.  PORTABLE CHEST - 1 VIEW  Comparison: 04/26/2012 and earlier.  Findings: Portable AP view at 0649 hours.  Stable tracheostomy. Stable visualized enteric feeding tube.  And 04/23/2012, increased bibasilar patchy and confluent opacity and increased patchy. Nodular and  patchy right upper lobe opacity also appears mildly increased.  No pneumothorax or pulmonary edema.  Stable left PICC line.  Stable cardiac size and mediastinal contours.  Probable small pleural effusions.  IMPRESSION: 1. Stable lines and tubes. 2.  Progressive bibasilar opacity since 04/23/2012.  Stable to mildly progressed patchy and nodular right upper lobe opacity. 3.  Probable small pleural effusions.   Original Report Authenticated By: Erskine Speed, M.D.      ASSESSMENT  1) Acute on chronic respiratory failure.  2) COPD - with resolving exacerbation.  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  Plan: 1) ATC as tolerated 2) Cont physical rehab and nutritional support 3) ENT following 4) No decannulation until physical status improves substantially and until glossomegaly improves 5) discussed Norcardia issue with Dr Stanton Kidney. Will repeat CT chest (last done in Jan). If RUL process is progressive, would probably warrant treatment directed @ this organism and ID consultation  [facility] Minor ACNP [facility] PCCM Pager [phone] till 3 pm If no answer page [phone] 04/29/2012, 11:36 AM   [name], MD ; [facility] service [place] [phone].  After 5:30 PM or weekends, call [phone]

## 2012-05-01 LAB — COMPREHENSIVE METABOLIC PANEL
Albumin: 1.8 g/dL — ABNORMAL LOW (ref 3.5–5.2)
Alkaline Phosphatase: 80 U/L (ref 39–117)
BUN: 6 mg/dL (ref 6–23)
CO2: 32 mEq/L (ref 19–32)
Chloride: 89 mEq/L — ABNORMAL LOW (ref 96–112)
GFR calc Af Amer: >90 mL/min (ref >90)
GFR calc non Af Amer: >90 mL/min (ref >90)
Glucose, Bld: 109 mg/dL — ABNORMAL HIGH (ref 70–99)
Potassium: 3.8 mEq/L (ref 3.5–5.1)
Total Bilirubin: 0.4 mg/dL (ref 0.3–1.2)

## 2012-05-01 LAB — URINE CULTURE: Culture: NO GROWTH

## 2012-05-01 LAB — CALCIUM, IONIZED: Calcium, Ion: 1.13 mmol/L (ref 1.12–1.23)

## 2012-05-01 LAB — CBC
HCT: 21.1 % — ABNORMAL LOW (ref 36.0–46.0)
Hemoglobin: 7.7 g/dL — ABNORMAL LOW (ref 12.0–15.0)
MCV: 77 fL — ABNORMAL LOW (ref 78.0–100.0)
RBC: 2.74 MIL/uL — ABNORMAL LOW (ref 3.87–5.11)
WBC: 6.4 10*3/uL (ref 4.0–10.5)

## 2012-05-01 LAB — PHOSPHORUS: Phosphorus: 4.2 mg/dL (ref 2.3–4.6)

## 2012-05-02 ENCOUNTER — Other Ambulatory Visit (HOSPITAL_COMMUNITY): Payer: Self-pay

## 2012-05-02 DIAGNOSIS — J441 Chronic obstructive pulmonary disease with (acute) exacerbation: Secondary | ICD-10-CM

## 2012-05-02 DIAGNOSIS — J438 Other emphysema: Secondary | ICD-10-CM

## 2012-05-02 DIAGNOSIS — J189 Pneumonia, unspecified organism: Secondary | ICD-10-CM | POA: Diagnosis not present

## 2012-05-02 LAB — URINALYSIS, ROUTINE W REFLEX MICROSCOPIC
Glucose, UA: NEGATIVE mg/dL
Leukocytes, UA: NEGATIVE
Protein, ur: NEGATIVE mg/dL
Specific Gravity, Urine: 1.015 (ref 1.005–1.030)
Urobilinogen, UA: 2 mg/dL — ABNORMAL HIGH (ref 0.0–1.0)

## 2012-05-02 LAB — MAGNESIUM: Magnesium: 1.7 mg/dL (ref 1.5–2.5)

## 2012-05-02 LAB — CULTURE, BLOOD (ROUTINE X 2)
Culture: NO GROWTH
Culture: NO GROWTH

## 2012-05-02 LAB — CULTURE, RESPIRATORY W GRAM STAIN: Culture: NO GROWTH

## 2012-05-02 NOTE — Consult Note (Signed)
NAMEROBINN, OVERHOLT               ACCOUNT NO.:  [id number]  MEDICAL RECORD NO.:  [id number]  LOCATION:                                 FACILITY:  PHYSICIAN:  Kristine Garbe. Ezzard Standing, M.D.DATE OF BIRTH:  [birth year]  DATE OF CONSULTATION:  04/27/2012 DATE OF DISCHARGE:                                CONSULTATION   REASON FOR CONSULT:  Evaluate the patient with tongue swelling.  BRIEF HISTORY:  Jennifer Hurley is a 57 year old, black female, who was admitted to [facility] couple of weeks ago with failure to thrive and poor p.o. intake.  She subsequently developed worsening shortness of breath and was intubated.  Her intubation was complicated by the patient biting the endotracheal tube as well as her tongue causing a laceration and swelling of her tongue.  Because of the swelling of the tongue, she subsequently underwent tracheotomy on April 22, 2012.  She was subsequently transferred to the [facility] on April 25, 2012, and at time of transfer was recommended to ENT followup of the tongue swelling.  I was subsequently consulted to evaluate the tongue swelling.  PHYSICAL EXAMINATION:  On examination, the patient has a significant anterior distal tongue swelling with the tongue protruding from her mouth with unable to close her mouth to cover the tongue.  On intraoral laceration, the buccal mucosa, roof of mouth, there was no swelling. Palpation of the tongue was soft at the base of tongue and most of the swollen portion of the tongue was the distal 1/3 or 1/2 of the tongue really distal to the laceration, which was on the ventral surface of the tongue.  The base of tongue was soft to palpation.  Neck was soft with no significant swelling in the neck.  IMPRESSION:  Anterior distal tongue swelling secondary to tongue trauma and ventral laceration.  Would expect this to gradually resolve over the next week, would recommend antibiotics to cover oral bacteria as well  as elevation of the head of bed and cool moist compresses to the distal expose of the tongue.          ______________________________ Kristine Garbe. Ezzard Standing, M.D.     CEN/MEDQ  D:  04/27/2012  T:  04/27/2012  Job:  [id number]

## 2012-05-02 NOTE — Progress Notes (Signed)
PULMONARY  / CRITICAL CARE MEDICINE  Name: Jennifer Hurley MRN: [id number] DOB: [birth year]    ADMISSION DATE:  04/25/2012 CONSULTATION DATE: 3-18   REFERRING MD :  [facility]  CHIEF COMPLAINT:  VDRF  BRIEF PATIENT DESCRIPTION:  57 yo female smoker admitted to [facility] 3/04 with AECOPD, FTT, VDRF.  Treated with systemic steroids, inhaled bronchodilators, and empiric antibiotics. Course complicated by agitated delirium and tongue biting with macroglossia 3/12. S/p trach 3/14.  Trach trials started 3/17 and transfer to [facility].  PCCM following for trach/pulm mgmt  Cultures: Sputum 3/07 >> moderate Group A Streptococcus Sputum 3/13 >> few candida  Sputum 3/15 >> possible nocardia Sputum 3/12 >> negative Sputum 3/24 >> Blood 3/24 >>   Tests: 3/05 Echo >> EF 55%, PAS 37 mmHg 3/21 CT chest >> b/l emphysema, improved RUL cavitary lesion, Lt apical scar, small b/l effusions and b/l lower lobe ASD  Subjective: Recurrent fever 3/24.  Not able to tolerate vent weaning 3/24.  VITAL SIGNS: Vital signs reviewed.   PHYSICAL EXAMINATION: General: Cachetic, increased WOB Neuro: Somnolent HEENT:  Trach site clean Cardiovascular: tachycardic Lungs: b/l rhonchi, no wheeze Abdomen:  Soft, non tender Ext: no edema, decreased muscle mass Skin: no rashes  LABS:  Recent Labs Lab 04/26/12 0450 04/28/12 0856 05/01/12 0537  NA 133* 132* 130*  K 3.2* 3.9 3.8  CL 91* 89* 89*  CO2 32 38* 32  BUN 5* 5* 6  CREATININE 0.36* 0.27* 0.28*  GLUCOSE 54* 113* 109*    Recent Labs Lab 04/26/12 0450 04/28/12 0856 05/01/12 0537  HGB 8.5* 7.5* 7.7*  HCT 24.4* 21.5* 21.1*  WBC 9.5 6.3 6.4  PLT 276 260 328    Imaging: Dg Chest Port 1 View  05/02/2012  *RADIOLOGY REPORT*  Clinical Data: Post tracheostomy  PORTABLE CHEST - 1 VIEW  Comparison: Portable exam 0649 hours compared to 04/29/2012  Findings: Tracheostomy tube, feeding tube, and left arm PICC line stable. Normal heart size, mediastinal contours, and  pulmonary vascularity. Emphysematous changes with increased bibasilar and persistent right upper lobe infiltrates. No significant pleural effusion or pneumothorax. Left upper lobe scarring again noted. Bones demineralized.  IMPRESSION: COPD changes with increased bibasilar infiltrates and persistent right upper lobe infiltrate   Original Report Authenticated By: Ulyses Southward, M.D.      ASSESSMENT  Acute on chronic respiratory failure 2nd to PNA with hx of severe COPD/emphysema.  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  Concern she has HCAP developing 3/24 causing failure to tolerate trach collar. P: Full vent support until more stable F/u CXR Abx per primary team F/u culture results from 3/24 Continue budesonide, duoneb  Macroglossia after tongue biting >> improving. P: Monitor  Failure to thrive, protein calorie malnutrition, decondition. This is likely contributing to difficulties with vent weaning. P: Per primary team   Brett Canales Minor ACNP [facility] PCCM Pager [phone] till 3 pm If no answer page [phone] 05/02/2012, 10:02 AM   Reviewed above, examined pt, and agree with assessment/plan.  CC time 35 minutes  Coralyn Helling, MD [facility] Pulmonary/Critical Care 05/02/2012, 11:55 AM Pager:  [phone] After 3pm call: [phone]

## 2012-05-03 ENCOUNTER — Other Ambulatory Visit (HOSPITAL_COMMUNITY): Payer: Self-pay

## 2012-05-03 DIAGNOSIS — J449 Chronic obstructive pulmonary disease, unspecified: Secondary | ICD-10-CM

## 2012-05-03 DIAGNOSIS — J9600 Acute respiratory failure, unspecified whether with hypoxia or hypercapnia: Secondary | ICD-10-CM

## 2012-05-03 LAB — BASIC METABOLIC PANEL
Chloride: 96 mEq/L (ref 96–112)
GFR calc Af Amer: >90 mL/min (ref >90)
GFR calc non Af Amer: >90 mL/min (ref >90)
Potassium: 3.5 mEq/L (ref 3.5–5.1)
Sodium: 134 mEq/L — ABNORMAL LOW (ref 135–145)

## 2012-05-03 LAB — CBC
Platelets: 381 10*3/uL (ref 150–400)
RBC: 2.4 MIL/uL — ABNORMAL LOW (ref 3.87–5.11)
WBC: 4.5 10*3/uL (ref 4.0–10.5)

## 2012-05-03 LAB — NA AND K (SODIUM & POTASSIUM), RAND UR: Sodium, Ur: 139 mEq/L

## 2012-05-03 NOTE — Progress Notes (Signed)
PULMONARY  / CRITICAL CARE MEDICINE  Name: Jennifer Hurley MRN: [id number] DOB: 22-Apr-1955    ADMISSION DATE:  04/25/2012 CONSULTATION DATE: 3-18   REFERRING MD :  [facility]  CHIEF COMPLAINT:  VDRF  BRIEF PATIENT DESCRIPTION:  57 yo female smoker admitted to [facility] 3/04 with AECOPD, FTT, VDRF.  Treated with systemic steroids, inhaled bronchodilators, and empiric antibiotics. Course complicated by agitated delirium and tongue biting with macroglossia 3/12. S/p trach 3/14.  Trach trials started 3/17 and transfer to [facility].  PCCM following for trach/pulm mgmt  Cultures: Sputum 3/07 >> moderate Group A Streptococcus Sputum 3/13 >> few candida  Sputum 3/15 >> possible nocardia Sputum 3/12 >> negative Sputum 3/24 >> Blood 3/24 >>   Tests: 3/05 Echo >> EF 55%, PAS 37 mmHg 3/21 CT chest >> b/l emphysema, improved RUL cavitary lesion, Lt apical scar, small b/l effusions and b/l lower lobe ASD  Subjective: No recurrence of fever.  More alert.  VITAL SIGNS: Vital signs reviewed.   PHYSICAL EXAMINATION: General: Cachetic, increased WOB Neuro: Awake, no following commands HEENT:  Trach site clean, macroglossia Cardiovascular: tachycardic Lungs: b/l rhonchi, no wheeze Abdomen:  Soft, non tender Ext: no edema, decreased muscle mass Skin: no rashes  LABS:  Recent Labs Lab 04/28/12 0856 05/01/12 0537 05/03/12 0500  NA 132* 130* 134*  K 3.9 3.8 3.5  CL 89* 89* 96  CO2 38* 32 29  BUN 5* 6 5*  CREATININE 0.27* 0.28* 0.25*  GLUCOSE 113* 109* 153*    Recent Labs Lab 04/28/12 0856 05/01/12 0537 05/02/12 1608 05/03/12 0500  HGB 7.5* 7.7* 7.2* 6.7*  HCT 21.5* 21.1* 22.7* 18.6*  WBC 6.3 6.4  --  4.5  PLT 260 328  --  381    Imaging: Dg Chest Port 1 View  05/02/2012  *RADIOLOGY REPORT*  Clinical Data: Post tracheostomy  PORTABLE CHEST - 1 VIEW  Comparison: Portable exam 0649 hours compared to 04/29/2012  Findings: Tracheostomy tube, feeding tube, and left arm PICC line stable.  Normal heart size, mediastinal contours, and pulmonary vascularity. Emphysematous changes with increased bibasilar and persistent right upper lobe infiltrates. No significant pleural effusion or pneumothorax. Left upper lobe scarring again noted. Bones demineralized.  IMPRESSION: COPD changes with increased bibasilar infiltrates and persistent right upper lobe infiltrate   Original Report Authenticated By: Ulyses Southward, M.D.      ASSESSMENT  Acute on chronic respiratory failure 2nd to PNA with hx of severe COPD/emphysema.  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  ?developing HCAP 3/24. 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 P: Monitor  Failure to thrive, protein calorie malnutrition, decondition. This is likely contributing to difficulties with vent weaning. P: Per primary team  Anemia. Transfuse 1 unit PRBC 3/25. P: F/u CBC per primary team   Coralyn Helling, MD [facility] Pulmonary/Critical Care 05/03/2012, 11:03 AM Pager:  [phone] After 3pm call: [phone]

## 2012-05-04 ENCOUNTER — Other Ambulatory Visit (HOSPITAL_COMMUNITY): Payer: Self-pay

## 2012-05-04 LAB — TSH: TSH: 2.286 u[IU]/mL (ref 0.350–4.500)

## 2012-05-04 LAB — CULTURE, RESPIRATORY W GRAM STAIN: Culture: NO GROWTH

## 2012-05-04 LAB — PREPARE RBC (CROSSMATCH)

## 2012-05-04 NOTE — Progress Notes (Signed)
PULMONARY  / CRITICAL CARE MEDICINE  Name: Jennifer Hurley MRN: [id number] DOB: March 18, 1955    ADMISSION DATE:  04/25/2012 CONSULTATION DATE: 3-18   REFERRING MD :  [facility]  CHIEF COMPLAINT:  VDRF  BRIEF PATIENT DESCRIPTION:  57 yo female smoker admitted to [facility] 3/04 with AECOPD, FTT, VDRF.  Treated with systemic steroids, inhaled bronchodilators, and empiric antibiotics. Course complicated by agitated delirium and tongue biting with macroglossia 3/12. S/p trach 3/14.  Trach trials started 3/17 and transfer to [facility].  PCCM following for trach/pulm mgmt  Cultures: Sputum 3/07 >> moderate Group A Streptococcus Sputum 3/13 >> few candida  Sputum 3/15 >> possible nocardia Sputum 3/12 >> negative Sputum 3/24 >>neg Blood 3/24 >>   Tests: 3/05 Echo >> EF 55%, PAS 37 mmHg 3/21 CT chest >> b/l emphysema, improved RUL cavitary lesion, Lt apical scar, small b/l effusions and b/l lower lobe ASD  Subjective: Trial on pressure support this AM.  VITAL SIGNS: Vital signs reviewed.  PHYSICAL EXAMINATION: General: Cachetic, increased WOB Neuro: Awake, not following commands HEENT:  Trach site clean, macroglossia Cardiovascular: tachycardic Lungs: b/l rhonchi, no wheeze Abdomen:  Soft, non tender Ext: no edema, decreased muscle mass Skin: no rashes  LABS:  Recent Labs Lab 04/28/12 0856 05/01/12 0537 05/03/12 0500  NA 132* 130* 134*  K 3.9 3.8 3.5  CL 89* 89* 96  CO2 38* 32 29  BUN 5* 6 5*  CREATININE 0.27* 0.28* 0.25*  GLUCOSE 113* 109* 153*    Recent Labs Lab 04/28/12 0856 05/01/12 0537 05/02/12 1608 05/03/12 0500  HGB 7.5* 7.7* 7.2* 6.7*  HCT 21.5* 21.1* 22.7* 18.6*  WBC 6.3 6.4  --  4.5  PLT 260 328  --  381    Imaging: Dg Chest Port 1 View  05/04/2012  *RADIOLOGY REPORT*  Clinical Data: Tracheostomy, shortness of breath.  PORTABLE CHEST - 1 VIEW  Comparison: 05/03/2012 and CT chest 04/29/2012.  Findings: Tracheostomy is midline.  Feeding tube is followed into the  stomach with the tip projecting beyond the inferior boundary of the film.  Left PICC tip projects over the SVC.  Heart size normal.  Slight worsening in bibasilar air space disease.  Lungs are emphysematous with opacification at the apex of the right hemithorax, as on 04/29/2012.  Probable bilateral pleural effusions.  IMPRESSION:  1.  Slight worsening in bibasilar air space disease. 2.  Persistent right apical air space opacification, described is cavitary on 04/29/2012. 3.  Small bilateral pleural effusions.   Original Report Authenticated By: Leanna Battles, M.D.    Dg Chest Port 1 View  05/03/2012  *RADIOLOGY REPORT*  Clinical Data: Follow up pneumonia  PORTABLE CHEST - 1 VIEW  Comparison: 30/24/14  Findings:  Tracheostomy tube tip is above the carina.  There is a left arm PICC line with tip in the cavoatrial junction. Feeding tube tip is in the stomach.  Normal heart size.  Pulmonary edema appears improved from previous exam.  Coarsened interstitial markings are again noted with bilateral upper lobe scarring.  Superimposed bilateral lower lobe airspace consolidation is similar to previous exam.  IMPRESSION:  1.  Persistent bilateral, lower lobe airspace consolidation. 2. Improvement in interstitial edema.   Original Report Authenticated By: Signa Kell, M.D.      ASSESSMENT  Acute on chronic respiratory failure 2nd to PNA with hx of severe COPD/emphysema.  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  ?developing HCAP 3/24. 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 P: Monitor  Failure to thrive, protein calorie malnutrition, decondition. This is likely contributing to difficulties with vent weaning. P: Per primary team  Anemia. Transfuse 1  unit PRBC 3/25. P: F/u CBC per primary team   Brett Canales Minor ACNP [facility] Pager [phone] till 3 pm If no answer page [phone] 05/04/2012, 10:20 AM  Reviewed above, examined pt, and agree with assessment/plan.  Coralyn Helling, MD [facility] Pulmonary/Critical Care 05/04/2012, 10:38 AM Pager:  [phone] After 3pm call: [phone]

## 2012-05-05 LAB — BASIC METABOLIC PANEL
CO2: 34 mEq/L — ABNORMAL HIGH (ref 19–32)
Calcium: 9.2 mg/dL (ref 8.4–10.5)
Creatinine, Ser: 0.33 mg/dL — ABNORMAL LOW (ref 0.50–1.10)
Glucose, Bld: 91 mg/dL (ref 70–99)
Sodium: 138 mEq/L (ref 135–145)

## 2012-05-06 ENCOUNTER — Other Ambulatory Visit (HOSPITAL_COMMUNITY): Payer: Self-pay

## 2012-05-06 LAB — CULTURE, BLOOD (ROUTINE X 2)

## 2012-05-06 LAB — BASIC METABOLIC PANEL
BUN: 6 mg/dL (ref 6–23)
Chloride: 89 mEq/L — ABNORMAL LOW (ref 96–112)
GFR calc Af Amer: >90 mL/min (ref >90)
Glucose, Bld: 83 mg/dL (ref 70–99)
Potassium: 4.2 mEq/L (ref 3.5–5.1)
Sodium: 129 mEq/L — ABNORMAL LOW (ref 135–145)

## 2012-05-06 LAB — CBC WITH DIFFERENTIAL/PLATELET
Basophils Relative: 0 % (ref 0–1)
Hemoglobin: 11.5 g/dL — ABNORMAL LOW (ref 12.0–15.0)
Lymphs Abs: 2.2 10*3/uL (ref 0.7–4.0)
Monocytes Relative: 10 % (ref 3–12)
Neutro Abs: 5.9 10*3/uL (ref 1.7–7.7)
Neutrophils Relative %: 66 % (ref 43–77)
Platelets: 455 10*3/uL — ABNORMAL HIGH (ref 150–400)
RBC: 4.03 MIL/uL (ref 3.87–5.11)

## 2012-05-06 LAB — TYPE AND SCREEN
ABO/RH(D): AB POS
Antibody Screen: NEGATIVE
Unit division: 0

## 2012-05-06 NOTE — Progress Notes (Signed)
PULMONARY  / CRITICAL CARE MEDICINE  Name: Jennifer Hurley MRN: [id number] DOB: Apr 21, 1955    ADMISSION DATE:  04/25/2012 CONSULTATION DATE: 3-18   REFERRING MD :  [facility]  CHIEF COMPLAINT:  VDRF  BRIEF PATIENT DESCRIPTION:  57 yo female smoker admitted to [facility] 3/04 with AECOPD, FTT, VDRF.  Treated with systemic steroids, inhaled bronchodilators, and empiric antibiotics. Course complicated by agitated delirium and tongue biting with macroglossia 3/12. S/p trach 3/14.  Trach trials started 3/17 and transfered to [facility].  PCCM following for trach/pulm mgmt  Cultures: Sputum 3/07 >> moderate Group A Streptococcus Sputum 3/13 >> few candida  Sputum 3/15 >> possible nocardia Sputum 3/12 >> negative Sputum 3/24 >>neg Blood 3/24 >>   Tests: 3/05 Echo >> EF 55%, PAS 37 mmHg 3/21 CT chest >> b/l emphysema, improved RUL cavitary lesion, Lt apical scar, small b/l effusions and b/l lower lobe ASD  Subjective: RT reports pt previously had tolerated extended time on ATC, today on PSV.  Did not tolerate ATC due to increased WOB  VITAL SIGNS: Vital signs reviewed.  PHYSICAL EXAMINATION: General: Cachetic, NAD Neuro: Awake, alert, MAE HEENT:  Trach site clean, macroglossia Cardiovascular: tachycardic Lungs: b/l rhonchi, no wheeze Abdomen:  Soft, non tender Ext: no edema, decreased muscle mass Skin: no rashes  LABS:  Recent Labs Lab 05/03/12 0500 05/05/12 0500 05/06/12 0605  NA 134* 138 129*  K 3.5 2.7* 4.2  CL 96 96 89*  CO2 29 34* 33*  BUN 5* 10 6  CREATININE 0.25* 0.33* 0.31*  GLUCOSE 153* 91 83    Recent Labs Lab 05/01/12 0537 05/02/12 1608 05/03/12 0500 05/06/12 0605  HGB 7.7* 7.2* 6.7* 11.5*  HCT 21.1* 22.7* 18.6* 31.5*  WBC 6.4  --  4.5 9.0  PLT 328  --  381 455*    Imaging: Dg Chest Port 1 View  05/06/2012  *RADIOLOGY REPORT*  Clinical Data: Shortness of breath, pneumonia  PORTABLE CHEST - 1 VIEW  Comparison: 05/04/2012; 05/02/2012; 04/29/2012; chest CT -  04/29/2012  Findings: Grossly unchanged cardiac silhouette and mediastinal contours.  Stable positioning of support apparatus.  The lungs remain hyperexpanded with flattening of bilateral hemidiaphragms. Minimal decrease in small/trace bilateral pleural effusions. Improved aeration of the bilateral lower lungs with persistent bibasilar opacities.  No new discrete focal airspace opacities.  No definite pneumothorax.  Unchanged bones.  IMPRESSION:  Overall finding suggestive of minimally improved pulmonary edema and atelectasis superimposed on marked emphysematous change.   Original Report Authenticated By: Tacey Ruiz, MD      ASSESSMENT  Acute on chronic respiratory failure 2nd to PNA with hx of severe COPD/emphysema.  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  ?developing HCAP 3/24.  P: Pressure support wean as tolerated F/u CXR 3/31 Abx per primary team F/u culture results from 3/24 Continue budesonide, duoneb Goal even to negative fluid balance as tolerated  Macroglossia after tongue biting >> seen by ENT 04/27/12.  Improved 3/28.    P: Monitor  Failure to thrive, protein calorie malnutrition, decondition. This is likely contributing to difficulties with vent weaning.  P: Per primary team  Anemia. Transfuse 1 unit PRBC 3/25.  P: F/u CBC per primary team   [name], NP-C [facility] Pgr: [phone] or [phone]  Clinically improved, and CXR improved.  Will likely be slow vent wean, but starting to make progress again.  Coralyn Helling, MD [facility] Pulmonary/Critical Care 05/06/2012, 11:32 AM  Pager:  [phone] After 3pm call: [phone]

## 2012-05-07 ENCOUNTER — Other Ambulatory Visit (HOSPITAL_COMMUNITY): Payer: Self-pay

## 2012-05-08 LAB — BASIC METABOLIC PANEL
CO2: 28 mEq/L (ref 19–32)
Chloride: 88 mEq/L — ABNORMAL LOW (ref 96–112)
Creatinine, Ser: 0.31 mg/dL — ABNORMAL LOW (ref 0.50–1.10)
GFR calc Af Amer: >90 mL/min (ref >90)
Potassium: 3.7 mEq/L (ref 3.5–5.1)

## 2012-05-08 LAB — DIGOXIN LEVEL: Digoxin Level: 1 ng/mL (ref 0.8–2.0)

## 2012-05-09 ENCOUNTER — Other Ambulatory Visit (HOSPITAL_COMMUNITY): Payer: Self-pay

## 2012-05-09 LAB — BASIC METABOLIC PANEL
CO2: 27 mEq/L (ref 19–32)
Chloride: 91 mEq/L — ABNORMAL LOW (ref 96–112)
Creatinine, Ser: 0.29 mg/dL — ABNORMAL LOW (ref 0.50–1.10)
GFR calc Af Amer: >90 mL/min (ref >90)
Potassium: 3.6 mEq/L (ref 3.5–5.1)
Sodium: 128 mEq/L — ABNORMAL LOW (ref 135–145)

## 2012-05-09 LAB — CULTURE, BLOOD (ROUTINE X 2): Culture: NO GROWTH

## 2012-05-09 LAB — MAGNESIUM: Magnesium: 1.4 mg/dL — ABNORMAL LOW (ref 1.5–2.5)

## 2012-05-09 NOTE — Progress Notes (Signed)
PULMONARY  / CRITICAL CARE MEDICINE  Name: Jennifer Hurley MRN: [id number] DOB: March 31, 1955    ADMISSION DATE:  04/25/2012 CONSULTATION DATE: 3-18   REFERRING MD :  [facility]  CHIEF COMPLAINT:  VDRF  BRIEF PATIENT DESCRIPTION:  57 yo female smoker admitted to [facility] 3/04 with AECOPD, FTT, VDRF.  Treated with systemic steroids, inhaled bronchodilators, and empiric antibiotics. Course complicated by agitated delirium and tongue biting with macroglossia 3/12. S/p trach 3/14.  Trach trials started 3/17 and transfered to [facility].  PCCM following for trach/pulm mgmt  Cultures: Sputum 3/07 >> moderate Group A Streptococcus Sputum 3/13 >> few candida  Sputum 3/15 >> possible nocardia Sputum 3/12 >> negative Sputum 3/24 >>neg Blood 3/24 >> NEG  Tests: 3/05 Echo >> EF 55%, PAS 37 mmHg 3/21 CT chest >> b/l emphysema, improved RUL cavitary lesion, Lt apical scar, small b/l effusions and b/l lower lobe ASD  Subjective: Confortable on ATC. Cognition appears intact  VITAL SIGNS: Vital signs reviewed.  PHYSICAL EXAMINATION: General: Cachetic, NAD Neuro: Awake, alert, MAE HEENT:  Trach site clean, macroglossia Cardiovascular: tachycardic Lungs: b/l rhonchi, no wheeze Abdomen:  Soft, non tender Ext: no edema   LABS:  Recent Labs Lab 05/06/12 0605 05/08/12 0507 05/09/12 0515  NA 129* 123* 128*  K 4.2 3.7 3.6  CL 89* 88* 91*  CO2 33* 28 27  BUN 6 4* 4*  CREATININE 0.31* 0.31* 0.29*  GLUCOSE 83 88 202*    Recent Labs Lab 05/02/12 1608 05/03/12 0500 05/06/12 0605  HGB 7.2* 6.7* 11.5*  HCT 22.7* 18.6* 31.5*  WBC  --  4.5 9.0  PLT  --  381 455*    Imaging: Dg Chest Port 1 View  05/09/2012  *RADIOLOGY REPORT*  Clinical Data: Trach, evaluate for airspace disease  PORTABLE CHEST - 1 VIEW  Comparison: 05/06/2012  Findings: Tracheostomy in satisfactory position.  Chronic interstitial markings/emphysematous changes.  Stable chronic cavitary process in the right upper lobe.  Patchy  bilateral lower lobe opacities, right greater than left, unchanged.  Trace bilateral pleural effusions.  No pneumothorax.  The heart is normal in size.  Stable left arm PICC.  Interval placement of an enteric tube coursing into the stomach.  IMPRESSION: Stable patchy bilateral lower lobe opacities, right greater than left, possibly reflecting pneumonia.  Trace bilateral pleural effusions.  Stable chronic cavitary process in the right upper lobe.   Original Report Authenticated By: Charline Bills, M.D.      ASSESSMENT  A: Acute on chronic respiratory failure 2nd to resolving PNA with hx of severe COPD/emphysema.   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   P: Cont wean per protocol Continue BDs   Macroglossia after tongue biting >> improving  P: Monitor   Failure to thrive, protein calorie malnutrition, decondition. This is likely contributing to difficulties with vent weaning.  P: Nutrition per primary team  Anemia.  P: F/u CBC per primary team   PCCM will see BIW   [name], MD ; [facility] service Mobile [phone].  After 5:30 PM or weekends, call [phone]

## 2012-05-10 ENCOUNTER — Inpatient Hospital Stay: Payer: Medicare Other | Admitting: Infectious Diseases

## 2012-05-10 ENCOUNTER — Other Ambulatory Visit (HOSPITAL_COMMUNITY): Payer: Self-pay

## 2012-05-10 NOTE — Progress Notes (Signed)
Patient ID: Jennifer Hurley, female   DOB: 26-Oct-1955, 57 y.o.   MRN: [id number]                                 Request received for placement of a percutaneous gastrostomy tube in pt with hx of FTT, protein calorie malnutrition. Patient's hx also sig for acute on chronic resp failure secondary to PNA/COPD with trach, subglossal hematoma following biting of tongue /ET tube and heart failure. Pt currently with NG tube in place. Additional PMH as below. Exam: thin ,frail pt awake/following commands; chest with few bil rhonchi; heart- tachy but regular; abd- soft,+BS,NT,ND; ext- no edema.   VSS;AF                                      Past Medical History  Diagnosis Date  . Rheumatoid arthritis   . Bronchitis   . Myocardial infarction   . Pneumonia   . COPD (chronic obstructive pulmonary disease)   . Chronic respiratory failure   . Hyponatremia   . Alcohol abuse   . Cocaine abuse   . Tobacco abuse    Past Surgical History  Procedure Laterality Date  . Tonsillectomy    . Tubal ligation     Dg Chest 1 View  04/12/2012  *RADIOLOGY REPORT*  Clinical Data: 57 year old female intubated and central line placement.  CHEST - 1 VIEW  Comparison: 1258 hours the same day and earlier.  Findings: Portable semi upright AP views of the chest at 2033 hours.  Endotracheal tube tip in place, projects about halfway between the level of clavicles and carina.  Left subclavian approach central line in place, tip at the cavoatrial junction level.  Enteric tube placed, courses to the left upper quadrant with tip not included.  Stable large lung volumes and patchy bilateral reticulonodular density.  Evidence of left upper lobe emphysema.  No pneumothorax, pleural effusion or pulmonary edema identified.  Stable cardiac size and mediastinal contours.  IMPRESSION: 1.  Intubated with endotracheal tube and enteric tube in good position. 2.  Left subclavian central line placed, tip at the cavoatrial junction level. 3.  Stable lungs.   No pneumothorax.   Original Report Authenticated By: Erskine Speed, M.D.    Ct Chest Wo Contrast  04/29/2012  *RADIOLOGY REPORT*  Clinical Data: Nocardiasis. Respiratory failure.  CT CHEST WITHOUT CONTRAST  Technique:  Multidetector CT imaging of the chest was performed following the standard protocol without IV contrast.  Comparison: 02/14/2012.  Findings: The chest wall is unremarkable.  No masses or obvious adenopathy.  A tracheostomy tube is in place along with a left PICC line.  A feeding tube is coursing down the esophagus and into the stomach.  The heart is normal in size.  No pericardial effusion.  Stable dense coronary artery calcifications.  The aorta is normal in caliber.  Examination of the lung parenchyma demonstrates fairly significant motion artifact.  Stable underlying emphysematous changes.  The right upper lobe cavitary process is definitely improved but still present.  Stable left apical scarring changes.  There are new small bilateral pleural effusions with overlying bilateral infiltrates with dense airspace consolidation.  No pulmonary edema or pneumothorax.  The upper abdomen is grossly normal.  IMPRESSION:  1.  Persistent but improved right upper lobe cavitary process. 2.  New small bilateral pleural  effusions and bilateral lower lobe infiltrates.  Possible aspiration. 3.  Support apparatus in good position without complicating features.   Original Report Authenticated By: Rudie Meyer, M.D.    US Renal  04/12/2012  *RADIOLOGY REPORT*  Clinical Data: History of acute renal injury.  History of dehydration.  RENAL/URINARY TRACT ULTRASOUND COMPLETE  Comparison:  None.  Findings:  Right Kidney:  Right renal length is 9.8 cm.  There is a small amount of urine in the collecting system of the right kidney with mild pyelectasis. No definite obstructive hydronephrosis is seen. There is increased echogenicity of the cortex with prominent pyramids.  Left Kidney:  Left renal length is 10.5 cm. There is  a small amount of urine in the collecting system of the left kidney with mild pyelectasis. There is increased echogenicity of cortex with prominent pyramids.  Examination of each kidney showed no evidence of diffuse or focal parenchymal loss, calculus, cyst, or solid mass.  Bladder:  Urinary bladder was empty with catheter in place.  IMPRESSION: Kidneys are normal size. There is a small amount of urine in the collecting system of each  kidney with mild pyelectasis. No definite obstructive hydronephrosis is seen.  There is increased echogenicity of the cortex with prominent pyramids. History given of dehydration.   Original Report Authenticated By: Onalee Hua Call    Dg Chest Port 1 View  05/09/2012  *RADIOLOGY REPORT*  Clinical Data: Janina Mayo, evaluate for airspace disease  PORTABLE CHEST - 1 VIEW  Comparison: 05/06/2012  Findings: Tracheostomy in satisfactory position.  Chronic interstitial markings/emphysematous changes.  Stable chronic cavitary process in the right upper lobe.  Patchy bilateral lower lobe opacities, right greater than left, unchanged.  Trace bilateral pleural effusions.  No pneumothorax.  The heart is normal in size.  Stable left arm PICC.  Interval placement of an enteric tube coursing into the stomach.  IMPRESSION: Stable patchy bilateral lower lobe opacities, right greater than left, possibly reflecting pneumonia.  Trace bilateral pleural effusions.  Stable chronic cavitary process in the right upper lobe.   Original Report Authenticated By: Charline Bills, M.D.    Dg Chest Port 1 View  05/06/2012  *RADIOLOGY REPORT*  Clinical Data: Shortness of breath, pneumonia  PORTABLE CHEST - 1 VIEW  Comparison: 05/04/2012; 05/02/2012; 04/29/2012; chest CT - 04/29/2012  Findings: Grossly unchanged cardiac silhouette and mediastinal contours.  Stable positioning of support apparatus.  The lungs remain hyperexpanded with flattening of bilateral hemidiaphragms. Minimal decrease in small/trace bilateral  pleural effusions. Improved aeration of the bilateral lower lungs with persistent bibasilar opacities.  No new discrete focal airspace opacities.  No definite pneumothorax.  Unchanged bones.  IMPRESSION:  Overall finding suggestive of minimally improved pulmonary edema and atelectasis superimposed on marked emphysematous change.   Original Report Authenticated By: Tacey Ruiz, MD    Dg Chest Port 1 View  05/04/2012  *RADIOLOGY REPORT*  Clinical Data: Tracheostomy, shortness of breath.  PORTABLE CHEST - 1 VIEW  Comparison: 05/03/2012 and CT chest 04/29/2012.  Findings: Tracheostomy is midline.  Feeding tube is followed into the stomach with the tip projecting beyond the inferior boundary of the film.  Left PICC tip projects over the SVC.  Heart size normal.  Slight worsening in bibasilar air space disease.  Lungs are emphysematous with opacification at the apex of the right hemithorax, as on 04/29/2012.  Probable bilateral pleural effusions.  IMPRESSION:  1.  Slight worsening in bibasilar air space disease. 2.  Persistent right apical air space opacification, described is  cavitary on 04/29/2012. 3.  Small bilateral pleural effusions.   Original Report Authenticated By: Leanna Battles, M.D.    Dg Chest Port 1 View  05/03/2012  *RADIOLOGY REPORT*  Clinical Data: Follow up pneumonia  PORTABLE CHEST - 1 VIEW  Comparison: 30/24/14  Findings:  Tracheostomy tube tip is above the carina.  There is a left arm PICC line with tip in the cavoatrial junction. Feeding tube tip is in the stomach.  Normal heart size.  Pulmonary edema appears improved from previous exam.  Coarsened interstitial markings are again noted with bilateral upper lobe scarring.  Superimposed bilateral lower lobe airspace consolidation is similar to previous exam.  IMPRESSION:  1.  Persistent bilateral, lower lobe airspace consolidation. 2. Improvement in interstitial edema.   Original Report Authenticated By: Signa Kell, M.D.    Dg Chest Port 1  View  05/02/2012  *RADIOLOGY REPORT*  Clinical Data: Post tracheostomy  PORTABLE CHEST - 1 VIEW  Comparison: Portable exam 0649 hours compared to 04/29/2012  Findings: Tracheostomy tube, feeding tube, and left arm PICC line stable. Normal heart size, mediastinal contours, and pulmonary vascularity. Emphysematous changes with increased bibasilar and persistent right upper lobe infiltrates. No significant pleural effusion or pneumothorax. Left upper lobe scarring again noted. Bones demineralized.  IMPRESSION: COPD changes with increased bibasilar infiltrates and persistent right upper lobe infiltrate   Original Report Authenticated By: Ulyses Southward, M.D.    Dg Chest Port 1 View  04/29/2012  *RADIOLOGY REPORT*  Clinical Data: 57 year old female with respiratory distress. Tracheostomy.  PORTABLE CHEST - 1 VIEW  Comparison: 04/26/2012 and earlier.  Findings: Portable AP view at 0649 hours.  Stable tracheostomy. Stable visualized enteric feeding tube.  And 04/23/2012, increased bibasilar patchy and confluent opacity and increased patchy. Nodular and patchy right upper lobe opacity also appears mildly increased.  No pneumothorax or pulmonary edema.  Stable left PICC line.  Stable cardiac size and mediastinal contours.  Probable small pleural effusions.  IMPRESSION: 1. Stable lines and tubes. 2.  Progressive bibasilar opacity since 04/23/2012.  Stable to mildly progressed patchy and nodular right upper lobe opacity. 3.  Probable small pleural effusions.   Original Report Authenticated By: Erskine Speed, M.D.    Dg Chest Port 1 View  04/26/2012  *RADIOLOGY REPORT*  Clinical Data: PICC line placement  PORTABLE CHEST - 1 VIEW  Comparison: 04/23/2012  Findings: Interval placement of a left arm PICC with its tip at the cavoatrial junction.  Chronic interstitial markings/emphysematous changes.  Stable right upper lobe opacity.  Mild right lower lobe opacity, increased.  No pleural effusion or pneumothorax.  Tracheostomy in  satisfactory position.  Enteric tube courses below the diaphragm.  IMPRESSION: Interval placement of a left arm PICC with its tip at the cavoatrial junction.  Stable right upper lobe opacity, suspicious for pneumonia.  Increasing right lower lobe opacity, atelectasis versus pneumonia.   Original Report Authenticated By: Charline Bills, M.D.    Dg Chest Port 1 View  04/23/2012  *RADIOLOGY REPORT*  Clinical Data: Tracheostomy.  Airspace disease.  PORTABLE CHEST - 1 VIEW  Comparison: One-view chest 04/22/2012  Findings: A tracheostomy tube is stable.  A feeding tube courses off the inferior border of the film.  Diffuse emphysematous changes are noted.  There is increasing opacification at the right lung apex.  Left apical densities are stable.  IMPRESSION:  1.  Increasing right apical opacity.  This may represent a worsening pneumonia or slight edema superimposed on scarring and unresolved pneumonia. 2.  Severe emphysema. 3.  Stable scarring at the left apex.   Original Report Authenticated By: Marin Roberts, M.D.    Chest Portable 1 View To Assess Tube Placement And Rule-out Pneumothorax  04/22/2012  *RADIOLOGY REPORT*  Clinical Data: Tracheostomy tube and NG tube placement.  PORTABLE CHEST - 1 VIEW  Comparison: 04/21/2012  Findings: The cardiomediastinal silhouette is unremarkable. A tracheostomy tube is in placed and appears in satisfactory position. Small bore feeding tube is identified entering the stomach with tip off the field of view.  Slightly improved bilateral interstitial and airspace opacities noted. Tiny bilateral pleural effusions again noted. There is no evidence of pneumothorax. Biapical pleuroparenchymal scarring and COPD/emphysema again noted.  IMPRESSION: Slightly improved bilateral interstitial airspace opacities/edema.  Tracheostomy tube and small bore feeding tube as described.  Unchanged tiny bilateral pleural effusions, biapical pleuroparenchymal scarring and changes of  COPD/emphysema.   Original Report Authenticated By: Harmon Pier, M.D.    Dg Chest Port 1 View  04/21/2012  *RADIOLOGY REPORT*  Clinical Data: Endotracheal tube position  PORTABLE CHEST - 1 VIEW  Comparison: 04/20/2012  Findings: Endotracheal tube tip 1 cm above the carina.  NG tube descends below the level of the image.  Hyperinflation with interstitial coarsening.  Blunted costophrenic angles. Biapical architectural distortion/scarring.  Underlying nodule not excluded. Continued attention on follow-up. No pneumothorax.  Osteopenia.  IMPRESSION: Endotracheal tube tip 1 cm above the carina.  Recommend 2-3 cm of retraction.  COPD/emphysema.   Original Report Authenticated By: Jearld Lesch, M.D.    Dg Chest Port 1 View  04/20/2012  *RADIOLOGY REPORT*  Clinical Data: Sedation, pleural effusions.  PORTABLE CHEST - 1 VIEW  Comparison: 04/20/2012.  Findings: Endotracheal tube terminates approximately 1.5 cm above the carina.  Heart size normal.  There are changes of bullous emphysema with slight worsening in diffuse bilateral air space disease.  Biapical pleural parenchymal scarring.  Small bilateral pleural effusions.  IMPRESSION:  1.  Endotracheal tube is slightly low-lying.  Pulling back approximately 2 cm would better position the tip above the carina. 2.  Slight worsening in diffuse bilateral air space disease with small bilateral pleural effusions.   Original Report Authenticated By: Leanna Battles, M.D.    Dg Chest Port 1 View  04/20/2012  *RADIOLOGY REPORT*  Clinical Data: Evaluate endotracheal tube placement.  PORTABLE CHEST - 1 VIEW  Comparison: Chest x-ray 04/19/2012.  Findings: An endotracheal tube is in place with tip 2.3 cm above the carina. A nasogastric tube is seen extending into the stomach, however, the tip of the nasogastric tube extends below the lower margin of the image.  There are persistent and slightly increasing bibasilar opacities which may reflect areas of atelectasis and/or  consolidation, likely with superimposed small bilateral pleural effusions (right greater than left).  No evidence of pulmonary edema.  Heart size is within normal limits.  Upper mediastinal contours are unremarkable.  Atherosclerosis in the thoracic aorta. Extensive architectural distortion in the lung apices bilaterally (right greater than left), compatible with chronic scarring.  IMPRESSION: 1.  Support apparatus, as above. 2.  Slight worsening aeration throughout the lung bases bilaterally may reflect increasing areas of atelectasis and/or consolidation with increasing small bilateral pleural effusions (right greater than left). 3.  Atherosclerosis.   Original Report Authenticated By: Trudie Reed, M.D.    Dg Chest Port 1 View  04/19/2012  *RADIOLOGY REPORT*  Clinical Data: Airspace disease.  Check endotracheal tube.  PORTABLE CHEST - 1 VIEW  Comparison: 04/18/2012  Findings: Endotracheal tube  is roughly 4.8 cm above the carina. Nasogastric tube extends into the abdomen.  Stable hyperinflation with chronic lung changes.  No significant change in the interstitial densities at the lung bases.  Cannot exclude small effusions. Heart size is stable.  IMPRESSION: Minimal change in the parenchymal lung disease.  Evidence for acute or chronic disease.  Support apparatuses as described.   Original Report Authenticated By: Richarda Overlie, M.D.    Dg Chest Port 1 View  04/18/2012  *RADIOLOGY REPORT*  Clinical Data: Endotracheal tube  PORTABLE CHEST - 1 VIEW  Comparison: 04/16/2012  Findings: Endotracheal tube slightly advanced now with its tip 1.9 cm from the carina.  Stable NG tube.  Bibasilar heterogeneous opacities improved on the left but stable on the right.  Right upper lobe opacities are stable. No pneumothorax.  Normal heart size.  IMPRESSION: Improved left basilar airspace disease.  Stable right lung airspace disease.   Original Report Authenticated By: Jolaine Click, M.D.    Dg Chest Port 1 View  04/16/2012   *RADIOLOGY REPORT*  Clinical Data: Central line placement.  PORTABLE CHEST - 1 VIEW  Comparison: Chest radiograph performed 04/15/2012  Findings: The patient's endotracheal tube is seen ending 4 cm above the carina.  A left subclavian line is noted ending at the distal SVC.  An enteric tube is noted extending below the diaphragm.  The lungs are well expanded.  Bibasilar airspace opacities are perhaps mildly worsened, concerning for worsening multifocal pneumonia.  Minimal upper lung zone opacities are also seen.  Small bilateral pleural effusions are likely present.  No pneumothorax is identified.  The cardiomediastinal silhouette is normal in size.  No acute osseous abnormalities are seen.  A metallic ring overlying the gastroesophageal junction is outside the patient, per clinical correlation.  IMPRESSION:  1.  Endotracheal tube seen ending 4 cm above the carina. 2.  Left subclavian line still seen ending at the distal SVC. 3.  Mildly worsened bibasilar airspace opacities concerning for mildly worsened multifocal pneumonia.  Likely small bilateral pleural effusions.   Original Report Authenticated By: Tonia Ghent, M.D.    Dg Chest Port 1 View  04/15/2012  *RADIOLOGY REPORT*  Clinical Data: Endotracheal tube position.  PORTABLE CHEST - 1 VIEW  Comparison: 04/14/2012 04/13/2012.  Findings: 0459 hours. The endotracheal tube is unchanged approximately 3.8 cm above the carina.  The left subclavian central venous catheter and nasogastric tube are unchanged.  Right greater than left basilar air space opacities and underlying chronic lung disease are again noted.  There is no pneumothorax.  The heart size and mediastinal contours are stable.  IMPRESSION: Stable support system and basilar air space opacities.   Original Report Authenticated By: Carey Bullocks, M.D.    Dg Chest Port 1 View  04/14/2012  *RADIOLOGY REPORT*  Clinical Data: Acute respiratory failure on ventilator.  COPD  PORTABLE CHEST - 1 VIEW   Comparison: 04/13/2012  Findings: The endotracheal tube, left subclavian center venous catheter and nasogastric tube remain in appropriate position.  Bilateral diffuse air space disease with bibasilar predominance is again seen superimposed on pulmonary emphysema.  This shows mild worsening in the right lung base, otherwise there is been no significant interval change.  No pleural effusion or pneumothorax identified.  Heart size remains normal.  IMPRESSION:  1.  Support apparatus in appropriate position. 2.  Bilateral airspace disease superimposed on pulmonary emphysema. 3.  Mild worsening of airspace disease noted in the right lung base.  No other significant interval change.  Original Report Authenticated By: Myles Rosenthal, M.D.    Dg Chest Port 1 View  04/13/2012  *RADIOLOGY REPORT*  Clinical Data: Status post intubation  PORTABLE CHEST - 1 VIEW  Comparison: 04/12/2012  Findings: There is an ET tube with 2.5 cm above the carina.  Nasogastric tube tip is in the stomach.  There is a left subclavian catheter with tip in the cavoatrial junction.  The lungs are hyperinflated and there are coarsened interstitial markings noted bilaterally.  Patchy airspace opacification within the left lower lobe and right upper lobe is similar to previous exam.  IMPRESSION:  1.  Stable position of endotracheal tube with tip above the carina. 2.  No change in aeration to the lungs.   Original Report Authenticated By: Signa Kell, M.D.    Dg Chest Port 1 View  04/12/2012  *RADIOLOGY REPORT*  Clinical Data: Weakness and shortness of breath.  PORTABLE CHEST - 1 VIEW  Comparison: 02/16/2012  Findings: Right upper lobe infiltrate has resolved since the prior study.  Severe chronic lung disease again noted without definite focal acute process.  The heart size is normal.  IMPRESSION: Resolution of previously identified right upper lobe infiltrate. Stable severe COPD.   Original Report Authenticated By: Irish Lack, M.D.    Dg Abd  Portable 1v  05/10/2012  *RADIOLOGY REPORT*  Clinical Data: History of nasogastric tube placement.  PORTABLE ABDOMEN - 1 VIEW  Comparison: 05/09/2012.  Findings: Tip of enteric tube projects in the proximal stomach at junction of fundus and body.  Cardiac silhouette is upper normal size.  Tip of left-sided PICC terminates in the lower portion of the superior vena cava.  Patchy infiltrate densities are seen in the right lung with associated atelectasis in the lower right lung. Infiltrative densities are seen with basilar atelectasis in the left lung. There is gaseous distention of bowel in the upper abdomen. Lower portion of the abdomen and pelvis are not included on the image.  IMPRESSION: Tip of enteric tube projects in proximal stomach.  PICC in place. Bilateral pulmonary infiltrative densities with associated bilateral atelectasis are again evident.   Original Report Authenticated By: Onalee Hua Call    Dg Abd Portable 1v  05/09/2012  *RADIOLOGY REPORT*  Clinical Data: KUB for NG tube placement.  PORTABLE ABDOMEN - 1 VIEW  Comparison: KUB 05/07/2012 and chest x-ray 05/09/2012  Findings: A nasogastric tube is present with tip over the stomach in the left upper quadrant.  The left-sided PICC line is unchanged. There is minimal bibasilar opacification unchanged.  There are multiple air-filled loops of large and small bowel without definite obstruction as this is unchanged.  Remainder of the exam is unchanged.  IMPRESSION: Nonobstructive, nonspecific bowel gas pattern.  Nasogastric tube with tip over the stomach in the left upper quadrant.  Stable mild bibasilar opacification.  Left-sided PICC line unchanged.   Original Report Authenticated By: Elberta Fortis, M.D.    Dg Abd Portable 1v  05/07/2012  *RADIOLOGY REPORT*  Clinical Data: Evaluate NG tube placement.  PORTABLE ABDOMEN - 1 VIEW  Comparison: 05/06/2012.  Findings: Previously noted feeding tube is present with tip likely in the stomach.  No other nasogastric  tube is identified. Gas and stool are seen scattered throughout the colon extending to the level of the distal rectum.  No pathologic distension of small bowel is noted.  No gross evidence of pneumoperitoneum.  A rectal thermistor is noted.  IMPRESSION: Tip of feeding tube is likely within the stomach.   Original Report  Authenticated By: Trudie Reed, M.D.    Dg Abd Portable 1v  05/06/2012  *RADIOLOGY REPORT*  Clinical Data: Nasogastric tube placement.  PORTABLE ABDOMEN - 1 VIEW  Comparison: 04/26/2012.  Findings: Nasogastric tube terminates in the stomach.  There is mild gaseous distention of colon in the upper abdomen.  Incidental imaging of the lung bases shows airspace disease, greater than right, with probable tiny bilateral pleural effusions.  IMPRESSION: Nasogastric terminates in the stomach.   Original Report Authenticated By: Leanna Battles, M.D.    Dg Abd Portable 1v  04/26/2012  *RADIOLOGY REPORT*  Clinical Data: Enteric tube placement.  PORTABLE ABDOMEN - 1 VIEW  Comparison: 04/26/2012 at 0343 hours  Findings: Interval advancement of enteric tube.  The tip is now in the right upper quadrant consistent with location in the distal stomach.  Examination is otherwise unchanged.  IMPRESSION: Enteric tube tip is in the right upper quadrant consistent with location in the distal stomach.   Original Report Authenticated By: Burman Nieves, M.D.    Dg Abd Portable 1v  04/26/2012  *RADIOLOGY REPORT*  Clinical Data: NG placement.  Second image obtained after advancement of tube.  PORTABLE ABDOMEN - 1 VIEW  Comparison: None.  Findings: Enteric tube is positioned at the EG junction, likely in the distal esophagus.  Positioning is unchanged between the two images.  Emphysematous changes and scarring in the lung bases. Mild prominence of gas filled colon suggesting ileus.  IMPRESSION: Enteric tube placed with tip at the EG junction, likely in the distal esophagus.   Original Report Authenticated By:  Burman Nieves, M.D.   Results for orders placed during the hospital encounter of 04/25/12  CULTURE, BLOOD (ROUTINE X 2)      Result Value Range   Specimen Description BLOOD LEFT HAND     Special Requests BOTTLES DRAWN AEROBIC ONLY 4CC     Culture  Setup Time 04/26/2012 12:56     Culture NO GROWTH 5 DAYS     Report Status 05/02/2012 FINAL    CULTURE, BLOOD (ROUTINE X 2)      Result Value Range   Specimen Description BLOOD LEFT WRIST     Special Requests BOTTLES DRAWN AEROBIC AND ANAEROBIC 10CC EA     Culture  Setup Time 04/26/2012 12:56     Culture NO GROWTH 5 DAYS     Report Status 05/02/2012 FINAL    URINE CULTURE      Result Value Range   Specimen Description URINE, RANDOM     Special Requests NONE     Culture  Setup Time 04/30/2012 01:23     Colony Count NO GROWTH     Culture NO GROWTH     Report Status 05/01/2012 FINAL    CULTURE, BLOOD (ROUTINE X 2)      Result Value Range   Specimen Description BLOOD ARM RIGHT     Special Requests       Value: BOTTLES DRAWN AEROBIC AND ANAEROBIC 10CC AER 6CC ANA   Culture  Setup Time 04/30/2012 02:05     Culture NO GROWTH 5 DAYS     Report Status 05/06/2012 FINAL    CULTURE, BLOOD (ROUTINE X 2)      Result Value Range   Specimen Description BLOOD PICC LINE     Special Requests BOTTLES DRAWN AEROBIC AND ANAEROBIC 5CC     Culture  Setup Time 04/30/2012 02:04     Culture NO GROWTH 5 DAYS     Report Status 05/06/2012 FINAL  CULTURE, RESPIRATORY (NON-EXPECTORATED)      Result Value Range   Specimen Description TRACHEAL ASPIRATE     Special Requests NONE     Gram Stain       Value: FEW WBC PRESENT, PREDOMINANTLY PMN     NO SQUAMOUS EPITHELIAL CELLS SEEN     NO ORGANISMS SEEN   Culture NO GROWTH 2 DAYS     Report Status 05/02/2012 FINAL    CULTURE, BLOOD (ROUTINE X 2)      Result Value Range   Specimen Description BLOOD RIGHT ARM     Special Requests BOTTLES DRAWN AEROBIC ONLY 8CC     Culture  Setup Time 05/02/2012 17:32      Culture NO GROWTH 5 DAYS     Report Status 05/09/2012 FINAL    CULTURE, BLOOD (ROUTINE X 2)      Result Value Range   Specimen Description BLOOD HAND RIGHT     Special Requests BOTTLES DRAWN AEROBIC AND ANAEROBIC 10CC     Culture  Setup Time 05/03/2012 01:37     Culture NO GROWTH 5 DAYS     Report Status 05/09/2012 FINAL    CULTURE, RESPIRATORY (NON-EXPECTORATED)      Result Value Range   Specimen Description TRACHEAL ASPIRATE     Special Requests Normal     Gram Stain       Value: FEW WBC PRESENT,BOTH PMN AND MONONUCLEAR     RARE SQUAMOUS EPITHELIAL CELLS PRESENT     NO ORGANISMS SEEN   Culture NO GROWTH 2 DAYS     Report Status 05/04/2012 FINAL    CBC      Result Value Range   WBC 9.5  4.0 - 10.5 K/uL   RBC 3.04 (*) 3.87 - 5.11 MIL/uL   Hemoglobin 8.5 (*) 12.0 - 15.0 g/dL   HCT 16.1 (*) 09.6 - 04.5 %   MCV 80.3  78.0 - 100.0 fL   MCH 28.0  26.0 - 34.0 pg   MCHC 34.8  30.0 - 36.0 g/dL   RDW 40.9 (*) 81.1 - 91.4 %   Platelets 276  150 - 400 K/uL  BASIC METABOLIC PANEL      Result Value Range   Sodium 133 (*) 135 - 145 mEq/L   Potassium 3.2 (*) 3.5 - 5.1 mEq/L   Chloride 91 (*) 96 - 112 mEq/L   CO2 32  19 - 32 mEq/L   Glucose, Bld 54 (*) 70 - 99 mg/dL   BUN 5 (*) 6 - 23 mg/dL   Creatinine, Ser 7.82 (*) 0.50 - 1.10 mg/dL   Calcium 8.4  8.4 - 95.6 mg/dL   GFR calc non Af Amer >90  >90 mL/min   GFR calc Af Amer >90  >90 mL/min  SEDIMENTATION RATE      Result Value Range   Sed Rate 127 (*) 0 - 22 mm/hr  PREALBUMIN      Result Value Range   Prealbumin 5.7 (*) 17.0 - 34.0 mg/dL  PROCALCITONIN      Result Value Range   Procalcitonin 0.44    PROCALCITONIN      Result Value Range   Procalcitonin 3.04    CBC      Result Value Range   WBC 6.3  4.0 - 10.5 K/uL   RBC 2.63 (*) 3.87 - 5.11 MIL/uL   Hemoglobin 7.5 (*) 12.0 - 15.0 g/dL   HCT 21.3 (*) 08.6 - 57.8 %   MCV 81.7  78.0 - 100.0  fL   MCH 28.5  26.0 - 34.0 pg   MCHC 34.9  30.0 - 36.0 g/dL   RDW 16.1 (*) 09.6 -  15.5 %   Platelets 260  150 - 400 K/uL  RENAL FUNCTION PANEL      Result Value Range   Sodium 132 (*) 135 - 145 mEq/L   Potassium 3.9  3.5 - 5.1 mEq/L   Chloride 89 (*) 96 - 112 mEq/L   CO2 38 (*) 19 - 32 mEq/L   Glucose, Bld 113 (*) 70 - 99 mg/dL   BUN 5 (*) 6 - 23 mg/dL   Creatinine, Ser 0.45 (*) 0.50 - 1.10 mg/dL   Calcium 8.3 (*) 8.4 - 10.5 mg/dL   Phosphorus 2.8  2.3 - 4.6 mg/dL   Albumin 1.8 (*) 3.5 - 5.2 g/dL   GFR calc non Af Amer >90  >90 mL/min   GFR calc Af Amer >90  >90 mL/min  VANCOMYCIN, TROUGH      Result Value Range   Vancomycin Tr 7.6 (*) 10.0 - 20.0 ug/mL  PROCALCITONIN      Result Value Range   Procalcitonin 0.80    VANCOMYCIN, TROUGH      Result Value Range   Vancomycin Tr 5.6 (*) 10.0 - 20.0 ug/mL  CBC      Result Value Range   WBC 6.4  4.0 - 10.5 K/uL   RBC 2.74 (*) 3.87 - 5.11 MIL/uL   Hemoglobin 7.7 (*) 12.0 - 15.0 g/dL   HCT 40.9 (*) 81.1 - 91.4 %   MCV 77.0 (*) 78.0 - 100.0 fL   MCH 28.1  26.0 - 34.0 pg   MCHC 36.5 (*) 30.0 - 36.0 g/dL   RDW 78.2 (*) 95.6 - 21.3 %   Platelets 328  150 - 400 K/uL  COMPREHENSIVE METABOLIC PANEL      Result Value Range   Sodium 130 (*) 135 - 145 mEq/L   Potassium 3.8  3.5 - 5.1 mEq/L   Chloride 89 (*) 96 - 112 mEq/L   CO2 32  19 - 32 mEq/L   Glucose, Bld 109 (*) 70 - 99 mg/dL   BUN 6  6 - 23 mg/dL   Creatinine, Ser 0.86 (*) 0.50 - 1.10 mg/dL   Calcium 8.2 (*) 8.4 - 10.5 mg/dL   Total Protein 5.7 (*) 6.0 - 8.3 g/dL   Albumin 1.8 (*) 3.5 - 5.2 g/dL   AST 35  0 - 37 U/L   ALT 21  0 - 35 U/L   Alkaline Phosphatase 80  39 - 117 U/L   Total Bilirubin 0.4  0.3 - 1.2 mg/dL   GFR calc non Af Amer >90  >90 mL/min   GFR calc Af Amer >90  >90 mL/min  PHOSPHORUS      Result Value Range   Phosphorus 4.2  2.3 - 4.6 mg/dL  MAGNESIUM      Result Value Range   Magnesium 1.4 (*) 1.5 - 2.5 mg/dL  VANCOMYCIN, TROUGH      Result Value Range   Vancomycin Tr 12.1  10.0 - 20.0 ug/mL  CALCIUM, IONIZED      Result Value  Range   Calcium, Ion 1.13  1.12 - 1.23 mmol/L  URINALYSIS, ROUTINE W REFLEX MICROSCOPIC      Result Value Range   Color, Urine YELLOW  YELLOW   APPearance CLEAR  CLEAR   Specific Gravity, Urine 1.015  1.005 - 1.030   pH 8.5 (*) 5.0 -  8.0   Glucose, UA NEGATIVE  NEGATIVE mg/dL   Hgb urine dipstick NEGATIVE  NEGATIVE   Bilirubin Urine NEGATIVE  NEGATIVE   Ketones, ur NEGATIVE  NEGATIVE mg/dL   Protein, ur NEGATIVE  NEGATIVE mg/dL   Urobilinogen, UA 2.0 (*) 0.0 - 1.0 mg/dL   Nitrite NEGATIVE  NEGATIVE   Leukocytes, UA NEGATIVE  NEGATIVE  HEMOGLOBIN AND HEMATOCRIT, BLOOD      Result Value Range   Hemoglobin 7.2 (*) 12.0 - 15.0 g/dL   HCT 16.1 (*) 09.6 - 04.5 %  MAGNESIUM      Result Value Range   Magnesium 1.7  1.5 - 2.5 mg/dL  NA AND K (SODIUM & POTASSIUM), RAND UR      Result Value Range   Sodium, Ur 139     Potassium Urine Timed 18    BASIC METABOLIC PANEL      Result Value Range   Sodium 134 (*) 135 - 145 mEq/L   Potassium 3.5  3.5 - 5.1 mEq/L   Chloride 96  96 - 112 mEq/L   CO2 29  19 - 32 mEq/L   Glucose, Bld 153 (*) 70 - 99 mg/dL   BUN 5 (*) 6 - 23 mg/dL   Creatinine, Ser 4.09 (*) 0.50 - 1.10 mg/dL   Calcium 8.6  8.4 - 81.1 mg/dL   GFR calc non Af Amer >90  >90 mL/min   GFR calc Af Amer >90  >90 mL/min  MAGNESIUM      Result Value Range   Magnesium 1.7  1.5 - 2.5 mg/dL  CBC      Result Value Range   WBC 4.5  4.0 - 10.5 K/uL   RBC 2.40 (*) 3.87 - 5.11 MIL/uL   Hemoglobin 6.7 (*) 12.0 - 15.0 g/dL   HCT 91.4 (*) 78.2 - 95.6 %   MCV 77.5 (*) 78.0 - 100.0 fL   MCH 27.9  26.0 - 34.0 pg   MCHC 36.0  30.0 - 36.0 g/dL   RDW 21.3 (*) 08.6 - 57.8 %   Platelets 381  150 - 400 K/uL  TSH      Result Value Range   TSH 2.286  0.350 - 4.500 uIU/mL  DIGOXIN LEVEL      Result Value Range   Digoxin Level 0.6 (*) 0.8 - 2.0 ng/mL  BASIC METABOLIC PANEL      Result Value Range   Sodium 138  135 - 145 mEq/L   Potassium 2.7 (*) 3.5 - 5.1 mEq/L   Chloride 96  96 - 112 mEq/L    CO2 34 (*) 19 - 32 mEq/L   Glucose, Bld 91  70 - 99 mg/dL   BUN 10  6 - 23 mg/dL   Creatinine, Ser 4.69 (*) 0.50 - 1.10 mg/dL   Calcium 9.2  8.4 - 62.9 mg/dL   GFR calc non Af Amer >90  >90 mL/min   GFR calc Af Amer >90  >90 mL/min  CBC WITH DIFFERENTIAL      Result Value Range   WBC 9.0  4.0 - 10.5 K/uL   RBC 4.03  3.87 - 5.11 MIL/uL   Hemoglobin 11.5 (*) 12.0 - 15.0 g/dL   HCT 52.8 (*) 41.3 - 24.4 %   MCV 78.2  78.0 - 100.0 fL   MCH 28.5  26.0 - 34.0 pg   MCHC 36.5 (*) 30.0 - 36.0 g/dL   RDW 01.0 (*) 27.2 - 53.6 %   Platelets 455 (*) 150 -  400 K/uL   Neutrophils Relative 66  43 - 77 %   Neutro Abs 5.9  1.7 - 7.7 K/uL   Lymphocytes Relative 24  12 - 46 %   Lymphs Abs 2.2  0.7 - 4.0 K/uL   Monocytes Relative 10  3 - 12 %   Monocytes Absolute 0.9  0.1 - 1.0 K/uL   Eosinophils Relative 0  0 - 5 %   Eosinophils Absolute 0.0  0.0 - 0.7 K/uL   Basophils Relative 0  0 - 1 %   Basophils Absolute 0.0  0.0 - 0.1 K/uL  BASIC METABOLIC PANEL      Result Value Range   Sodium 129 (*) 135 - 145 mEq/L   Potassium 4.2  3.5 - 5.1 mEq/L   Chloride 89 (*) 96 - 112 mEq/L   CO2 33 (*) 19 - 32 mEq/L   Glucose, Bld 83  70 - 99 mg/dL   BUN 6  6 - 23 mg/dL   Creatinine, Ser 1.61 (*) 0.50 - 1.10 mg/dL   Calcium 8.8  8.4 - 09.6 mg/dL   GFR calc non Af Amer >90  >90 mL/min   GFR calc Af Amer >90  >90 mL/min  BASIC METABOLIC PANEL      Result Value Range   Sodium 123 (*) 135 - 145 mEq/L   Potassium 3.7  3.5 - 5.1 mEq/L   Chloride 88 (*) 96 - 112 mEq/L   CO2 28  19 - 32 mEq/L   Glucose, Bld 88  70 - 99 mg/dL   BUN 4 (*) 6 - 23 mg/dL   Creatinine, Ser 0.45 (*) 0.50 - 1.10 mg/dL   Calcium 7.9 (*) 8.4 - 10.5 mg/dL   GFR calc non Af Amer >90  >90 mL/min   GFR calc Af Amer >90  >90 mL/min  DIGOXIN LEVEL      Result Value Range   Digoxin Level 1.0  0.8 - 2.0 ng/mL  BASIC METABOLIC PANEL      Result Value Range   Sodium 128 (*) 135 - 145 mEq/L   Potassium 3.6  3.5 - 5.1 mEq/L   Chloride 91 (*)  96 - 112 mEq/L   CO2 27  19 - 32 mEq/L   Glucose, Bld 202 (*) 70 - 99 mg/dL   BUN 4 (*) 6 - 23 mg/dL   Creatinine, Ser 4.09 (*) 0.50 - 1.10 mg/dL   Calcium 8.4  8.4 - 81.1 mg/dL   GFR calc non Af Amer >90  >90 mL/min   GFR calc Af Amer >90  >90 mL/min  MAGNESIUM      Result Value Range   Magnesium 1.4 (*) 1.5 - 2.5 mg/dL  PREPARE RBC (CROSSMATCH)      Result Value Range   Order Confirmation ORDER PROCESSED BY BLOOD BANK    TYPE AND SCREEN      Result Value Range   ABO/RH(D) AB POS     Antibody Screen NEG     Sample Expiration 05/07/2012     Unit Number [id number]     Blood Component Type RED CELLS,LR     Unit division 00     Status of Unit ISSUED,FINAL     Transfusion Status OK TO TRANSFUSE     Crossmatch Result Compatible     Unit Number [id number]     Blood Component Type RED CELLS,LR     Unit division 00     Status of Unit ISSUED,FINAL  Transfusion Status OK TO TRANSFUSE     Crossmatch Result Compatible    ABO/RH      Result Value Range   ABO/RH(D) AB POS     A/P: Pt with hx acute on chronic resp failure secondary to PNA/COPD, now with trach, FTT and protein calorie malnutrition. Tent plan is for placement of a percutaneous gastrostomy tube on 4/2 pending f/u labs, abd film results in am. Pt aware and family to be contacted for consent.

## 2012-05-11 ENCOUNTER — Inpatient Hospital Stay: Payer: Medicare Other | Admitting: Infectious Diseases

## 2012-05-11 ENCOUNTER — Other Ambulatory Visit (HOSPITAL_COMMUNITY): Payer: Self-pay

## 2012-05-11 LAB — PROTIME-INR
INR: 1.02 (ref 0.00–1.49)
Prothrombin Time: 13.3 seconds (ref 11.6–15.2)

## 2012-05-11 LAB — BASIC METABOLIC PANEL
BUN: 5 mg/dL — ABNORMAL LOW (ref 6–23)
CO2: 27 mEq/L (ref 19–32)
Chloride: 91 mEq/L — ABNORMAL LOW (ref 96–112)
GFR calc non Af Amer: >90 mL/min (ref >90)
Glucose, Bld: 98 mg/dL (ref 70–99)
Potassium: 2.8 mEq/L — ABNORMAL LOW (ref 3.5–5.1)
Sodium: 130 mEq/L — ABNORMAL LOW (ref 135–145)

## 2012-05-11 LAB — CBC
HCT: 34.1 % — ABNORMAL LOW (ref 36.0–46.0)
Hemoglobin: 12.4 g/dL (ref 12.0–15.0)
RBC: 4.41 MIL/uL (ref 3.87–5.11)
WBC: 10.3 10*3/uL (ref 4.0–10.5)

## 2012-05-11 LAB — APTT: aPTT: 34 seconds (ref 24–37)

## 2012-05-12 ENCOUNTER — Other Ambulatory Visit (HOSPITAL_COMMUNITY): Payer: Self-pay

## 2012-05-12 ENCOUNTER — Other Ambulatory Visit (HOSPITAL_COMMUNITY): Payer: Medicare Other

## 2012-05-12 LAB — BASIC METABOLIC PANEL
Calcium: 8.3 mg/dL — ABNORMAL LOW (ref 8.4–10.5)
GFR calc Af Amer: >90 mL/min (ref >90)
GFR calc non Af Amer: >90 mL/min (ref >90)
Glucose, Bld: 90 mg/dL (ref 70–99)
Potassium: 3.2 mEq/L — ABNORMAL LOW (ref 3.5–5.1)
Sodium: 129 mEq/L — ABNORMAL LOW (ref 135–145)

## 2012-05-13 ENCOUNTER — Other Ambulatory Visit (HOSPITAL_COMMUNITY): Payer: Self-pay

## 2012-05-13 LAB — BASIC METABOLIC PANEL
BUN: 6 mg/dL (ref 6–23)
CO2: 29 mEq/L (ref 19–32)
Chloride: 94 mEq/L — ABNORMAL LOW (ref 96–112)
Creatinine, Ser: 0.36 mg/dL — ABNORMAL LOW (ref 0.50–1.10)
Glucose, Bld: 89 mg/dL (ref 70–99)

## 2012-05-13 MED ORDER — IOHEXOL 300 MG/ML  SOLN
50.0000 mL | Freq: Once | INTRAMUSCULAR | Status: AC | PRN
  Administered 2012-05-13: 20 mL via INTRAVENOUS

## 2012-05-13 MED ORDER — FENTANYL CITRATE 0.05 MG/ML IJ SOLN
INTRAMUSCULAR | Status: AC | PRN
  Administered 2012-05-13 (×2): 50 ug via INTRAVENOUS

## 2012-05-13 MED ORDER — MIDAZOLAM HCL 2 MG/2ML IJ SOLN
INTRAMUSCULAR | Status: AC | PRN
  Administered 2012-05-13 (×2): 1 mg via INTRAVENOUS

## 2012-05-13 MED ORDER — CEFAZOLIN SODIUM 1-5 GM-% IV SOLN
1.0000 g | Freq: Three times a day (TID) | INTRAVENOUS | Status: DC
  Administered 2012-05-13: 1 g via INTRAVENOUS

## 2012-05-13 MED ORDER — CEFAZOLIN SODIUM 1-5 GM-% IV SOLN
INTRAVENOUS | Status: AC
  Filled 2012-05-13: qty 50

## 2012-05-13 NOTE — Procedures (Signed)
Gastrostomy tube 67F No complication No blood loss. See complete dictation in [facility].

## 2012-05-13 NOTE — Progress Notes (Signed)
PULMONARY  / CRITICAL CARE MEDICINE  Name: Jennifer Hurley MRN: [id number] DOB: [birth year]    ADMISSION DATE:  04/25/2012 CONSULTATION DATE: 3-18   REFERRING MD :  [facility]  CHIEF COMPLAINT:  VDRF  BRIEF PATIENT DESCRIPTION:  57 yo female smoker admitted to [facility] 3/04 with AECOPD, FTT, VDRF.  Treated with systemic steroids, inhaled bronchodilators, and empiric antibiotics. Course complicated by agitated delirium and tongue biting with macroglossia 3/12. S/p trach 3/14.  Trach trials started 3/17 and transfered to [facility].  PCCM following for trach/pulm mgmt  Cultures: Sputum 3/07 >> moderate Group A Streptococcus Sputum 3/13 >> few candida  Sputum 3/15 >> possible nocardia Sputum 3/12 >> negative Sputum 3/24 >>neg Blood 3/24 >> NEG  Tests: 3/05 Echo >> EF 55%, PAS 37 mmHg 3/21 CT chest >> b/l emphysema, improved RUL cavitary lesion, Lt apical scar, small b/l effusions and b/l lower lobe ASD  Subjective: Confortable on ATC. Cognition appears intact.  Mouths - "I am going home".  RN reports pt has not tolerated PMV due to desaturations.   VITAL SIGNS: Vital signs reviewed.  PHYSICAL EXAMINATION: General: Cachetic, NAD Neuro: Awake, alert, MAE HEENT:  Trach site clean, macroglossia resolved.  Minimal creamy secretions.   Cardiovascular: tachycardic Lungs: b/l rhonchi, no wheeze Abdomen:  Soft, non tender Ext: no edema   LABS:  Recent Labs Lab 05/11/12 0500 05/12/12 0500 05/13/12 0430  NA 130* 129* 131*  K 2.8* 3.2* 3.7  CL 91* 92* 94*  CO2 27 28 29   BUN 5* 6 6  CREATININE 0.24* 0.39* 0.36*  GLUCOSE 98 90 89    Recent Labs Lab 05/11/12 0500  HGB 12.4  HCT 34.1*  WBC 10.3  PLT 453*    Imaging: Dg Abd Portable 1v  05/13/2012  *RADIOLOGY REPORT*  Clinical Data: Evaluate barium  PORTABLE ABDOMEN - 1 VIEW  Comparison: Plain film 05/12/2012  Findings: Feeding tube within the stomach with tip in the gastric body.  There is some interval clearing of barium contrast  within the colon.  There is still a significant amount of barium in the inferior abdomen.  IMPRESSION: Some interval clearing of barium from colon.   Original Report Authenticated By: Genevive Bi, M.D.    Dg Abd Portable 1v  05/12/2012  *RADIOLOGY REPORT*  Clinical Data: Nasogastric tube placement  PORTABLE ABDOMEN - 1 VIEW  Comparison: Portable exam 1705 hours compared to 07/16 our  Findings: Tip of feeding tube projects over mid stomach. Nasogastric tube tip projects over mid stomach. Contrast throughout colon. COPD changes at lung bases with atelectasis versus infiltrate in the left lower lobe. Osseous demineralization.  IMPRESSION: Tip of nasogastric and feeding tubes project over stomach. COPD changes with left basilar atelectasis versus infiltrate.   Original Report Authenticated By: Ulyses Southward, M.D.    Dg Abd Portable 1v  05/12/2012  *RADIOLOGY REPORT*  Clinical Data: Evaluate for residual barium  PORTABLE ABDOMEN - 1 VIEW  Comparison: Abdomen film of 05/11/2012  Findings: The barium noted previously within the small bowel has now moved into the large bowel with a moderate amount of barium remaining.  A feeding tube is present with the tip in the region of the fundus of the stomach.  No bowel obstruction is seen.  IMPRESSION:  The barium has moved into the colon with a considerable amount of barium remaining.   Original Report Authenticated By: Dwyane Dee, M.D.      ASSESSMENT  A:  Acute on chronic respiratory failure 2nd to resolving  PNA with hx of severe COPD/emphysema.   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    P: -Cont wean per protocol -Continue BDs -F/u cxr Monday -continue PMV efforts   Macroglossia after tongue biting >> Resolved 4/4.    P: -Monitor / supportive care   Failure to thrive, protein calorie malnutrition, decondition - This is likely contributing to difficulties with vent weaning.  P: Nutrition per  primary team  Anemia.  P: F/u CBC per primary team   PCCM will see M/F  [name], NP-C [place] Pgr: [phone] or [phone]   I have interviewed and examined the patient and reviewed the database. I have formulated the assessment and plan as reflected in the note above with amendments made by me.    [name], MD;  PCCM service; Mobile [phone]

## 2012-05-16 ENCOUNTER — Other Ambulatory Visit (HOSPITAL_COMMUNITY): Payer: Self-pay

## 2012-05-16 LAB — CBC
HCT: 31 % — ABNORMAL LOW (ref 36.0–46.0)
MCH: 27.6 pg (ref 26.0–34.0)
MCV: 78.5 fL (ref 78.0–100.0)
RDW: 16.3 % — ABNORMAL HIGH (ref 11.5–15.5)
WBC: 11.5 10*3/uL — ABNORMAL HIGH (ref 4.0–10.5)

## 2012-05-16 LAB — BASIC METABOLIC PANEL
BUN: 4 mg/dL — ABNORMAL LOW (ref 6–23)
Calcium: 8.4 mg/dL (ref 8.4–10.5)
Chloride: 97 mEq/L (ref 96–112)
Creatinine, Ser: 0.36 mg/dL — ABNORMAL LOW (ref 0.50–1.10)
GFR calc Af Amer: >90 mL/min (ref >90)

## 2012-05-16 NOTE — Progress Notes (Signed)
PULMONARY  / CRITICAL CARE MEDICINE  Name: Jennifer Hurley MRN: [id number] DOB: [birth year]    ADMISSION DATE:  04/25/2012 CONSULTATION DATE: 3-18   REFERRING MD :  [facility]  CHIEF COMPLAINT:  VDRF  BRIEF PATIENT DESCRIPTION:  57 yo female smoker admitted to [facility] 3/04 with AECOPD, FTT, VDRF.  Treated with systemic steroids, inhaled bronchodilators, and empiric antibiotics. Course complicated by agitated delirium and tongue biting with macroglossia 3/12. S/p trach 3/14.  Trach trials started 3/17 and transfered to [facility].  PCCM following for trach/pulm mgmt  Cultures: Sputum 3/07 >> moderate Group A Streptococcus Sputum 3/13 >> few candida  Sputum 3/15 >> possible nocardia Sputum 3/12 >> negative Sputum 3/24 >>neg Blood 3/24 >> NEG  Tests: 3/05 Echo >> EF 55%, PAS 37 mmHg 3/21 CT chest >> b/l emphysema, improved RUL cavitary lesion, Lt apical scar, small b/l effusions and b/l lower lobe ASD  Subjective: Confortable on ATC. Cognition appears intact.  Mouths - "I am going home".  RN reports pt has not tolerated PMV due to desaturations.   VITAL SIGNS: Vital signs reviewed.  PHYSICAL EXAMINATION: General: Cachetic, NAD Neuro: Awake, alert, MAE HEENT:  Trach site clean, macroglossia resolved.  Minimal creamy secretions.   Cardiovascular: tachycardic Lungs: b/l rhonchi, no wheeze Abdomen:  Soft, non tender Ext: no edema   LABS:  Recent Labs Lab 05/12/12 0500 05/13/12 0430 05/16/12 0500  NA 129* 131* 131*  K 3.2* 3.7 3.6  CL 92* 94* 97  CO2 28 29 27   BUN 6 6 4*  CREATININE 0.39* 0.36* 0.36*  GLUCOSE 90 89 116*    Recent Labs Lab 05/11/12 0500 05/16/12 0500  HGB 12.4 10.9*  HCT 34.1* 31.0*  WBC 10.3 11.5*  PLT 453* 264    Imaging: Dg Chest Port 1 View  05/16/2012  *RADIOLOGY REPORT*  Clinical Data: Respiratory failure.  Evaluate airspace disease.  PORTABLE CHEST - 1 VIEW  Comparison: Chest x-ray 05/09/2012.  Findings: Tracheostomy tube is in place with tip 7.2 cm  above the carina. There is a left upper extremity PICC with tip terminating in the distal superior vena cava. Background of emphysematous changes is again noted.  Patchy multifocal interstitial and airspace disease is seen scattered throughout the right lung, particularly at the right base.  Overall, aeration has slightly improved.  Trace bilateral pleural effusions are unchanged.  No evidence of pulmonary edema.  Heart size is within normal limits. The patient is rotated to the right on today's exam, resulting in distortion of the mediastinal contours and reduced diagnostic sensitivity and specificity for mediastinal pathology.  IMPRESSION: 1.  Support apparatus, as above. 2.  Persistent but decreasing patchy multifocal interstitial and airspace disease throughout the right lung, most compatible with resolving multilobar pneumonia. 3. Trace bilateral pleural effusions are slightly decreased.   Original Report Authenticated By: Trudie Reed, M.D.      ASSESSMENT  A:  Acute on chronic respiratory failure 2nd to resolving PNA with hx of severe COPD/emphysema.   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  Has been doing TC during the day and vent at night x2 wks.  P: - Will attempt TC at night today for the first time, if able then will need to down size to 4 cuffless. - Continue BDs. - Continue PMV efforts.  Macroglossia after tongue biting >> Resolved 4/4.   P: - Monitor/supportive care.  Failure to thrive, protein calorie malnutrition, decondition - This is likely contributing  to difficulties with vent weaning. P: Nutrition per primary team  Anemia. P: F/u CBC per primary team  Alyson Reedy, M.D. [facility] Pulmonary/Critical Care Medicine. Pager: [phone]. After hours pager: [phone].

## 2012-05-18 DIAGNOSIS — Z430 Encounter for attention to tracheostomy: Secondary | ICD-10-CM

## 2012-05-18 NOTE — Procedures (Signed)
First Trach Change  Sutures removed, size 4 cuffless placed in place of 6 cuffed trach without complication.  Good color change.  Alyson Reedy, M.D. [facility] Pulmonary/Critical Care Medicine. Pager: [phone]. After hours pager: [phone].

## 2012-05-18 NOTE — Progress Notes (Signed)
PULMONARY  / CRITICAL CARE MEDICINE  Name: Jennifer Hurley MRN: [id number] DOB: 1955/05/21    ADMISSION DATE:  04/25/2012 CONSULTATION DATE: 3-18   REFERRING MD :  [facility]  CHIEF COMPLAINT:  VDRF  BRIEF PATIENT DESCRIPTION:  57 yo female smoker admitted to [facility] 3/04 with AECOPD, FTT, VDRF.  Treated with systemic steroids, inhaled bronchodilators, and empiric antibiotics. Course complicated by agitated delirium and tongue biting with macroglossia 3/12. S/p trach 3/14.  Trach trials started 3/17 and transfered to [facility].  PCCM following for trach/pulm mgmt  Cultures: Sputum 3/07 >> moderate Group A Streptococcus Sputum 3/13 >> few candida  Sputum 3/15 >> possible nocardia Sputum 3/12 >> negative Sputum 3/24 >>neg Blood 3/24 >> NEG  Tests: 3/05 Echo >> EF 55%, PAS 37 mmHg 3/21 CT chest >> b/l emphysema, improved RUL cavitary lesion, Lt apical scar, small b/l effusions and b/l lower lobe ASD  Subjective: Confortable on ATC. Cognition appears intact.  Mouths - "I am going home".  RN reports pt has not tolerated PMV due to desaturations.   VITAL SIGNS: Vital signs reviewed.  PHYSICAL EXAMINATION: General: Cachetic, NAD Neuro: Awake, alert, MAE HEENT:  Trach site clean, macroglossia resolved.  Minimal creamy secretions.   Cardiovascular: tachycardic Lungs: b/l rhonchi, no wheeze Abdomen:  Soft, non tender Ext: no edema   LABS:  Recent Labs Lab 05/12/12 0500 05/13/12 0430 05/16/12 0500  NA 129* 131* 131*  K 3.2* 3.7 3.6  CL 92* 94* 97  CO2 28 29 27   BUN 6 6 4*  CREATININE 0.39* 0.36* 0.36*  GLUCOSE 90 89 116*    Recent Labs Lab 05/16/12 0500  HGB 10.9*  HCT 31.0*  WBC 11.5*  PLT 264    Imaging: No results found.   ASSESSMENT  A:  Acute on chronic respiratory failure 2nd to resolving PNA with hx of severe COPD/emphysema.   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  Has been doing TC during the day and vent at  night x2 wks.  P: - Was able to tolerate TC overnight, remove vent from room and change trach to a 4 cuffless on route to decannulation. - Continue BDs. - Continue PMV efforts.  Macroglossia after tongue biting >> Resolved 4/4.   P: - Monitor/supportive care.  Failure to thrive, protein calorie malnutrition, decondition - This is likely contributing to difficulties with vent weaning. P: Nutrition per primary team  Anemia. P: F/u CBC per primary team  Alyson Reedy, M.D. [facility] Pulmonary/Critical Care Medicine. Pager: [phone]. After hours pager: [phone].

## 2012-05-19 ENCOUNTER — Other Ambulatory Visit (HOSPITAL_COMMUNITY): Payer: Medicare Other

## 2012-05-20 DIAGNOSIS — F17200 Nicotine dependence, unspecified, uncomplicated: Secondary | ICD-10-CM

## 2012-05-20 LAB — CBC WITH DIFFERENTIAL/PLATELET
Basophils Relative: 0 % (ref 0–1)
Eosinophils Absolute: 0 10*3/uL (ref 0.0–0.7)
HCT: 34.9 % — ABNORMAL LOW (ref 36.0–46.0)
Hemoglobin: 12.3 g/dL (ref 12.0–15.0)
MCH: 27.4 pg (ref 26.0–34.0)
MCHC: 35.2 g/dL (ref 30.0–36.0)
Monocytes Absolute: 0.7 10*3/uL (ref 0.1–1.0)
Monocytes Relative: 7 % (ref 3–12)
Neutrophils Relative %: 58 % (ref 43–77)

## 2012-05-20 LAB — BASIC METABOLIC PANEL
BUN: 10 mg/dL (ref 6–23)
Creatinine, Ser: 0.41 mg/dL — ABNORMAL LOW (ref 0.50–1.10)
GFR calc Af Amer: >90 mL/min (ref >90)
GFR calc non Af Amer: >90 mL/min (ref >90)

## 2012-05-20 NOTE — Progress Notes (Signed)
PULMONARY  / CRITICAL CARE MEDICINE  Name: Jennifer Hurley MRN: [id number] DOB: 05/16/1955    ADMISSION DATE:  04/25/2012 CONSULTATION DATE: 3-18   REFERRING MD :  [facility]  CHIEF COMPLAINT:  VDRF  BRIEF PATIENT DESCRIPTION:  57 yo female smoker admitted to [facility] 3/04 with AECOPD, FTT, VDRF.  Treated with systemic steroids, inhaled bronchodilators, and empiric antibiotics. Course complicated by agitated delirium and tongue biting with macroglossia 3/12. S/p trach 3/14.  Trach trials started 3/17 and transfered to [facility].  PCCM following for trach/pulm mgmt  Cultures: Sputum 3/07 >> moderate Group A Streptococcus Sputum 3/13 >> few candida  Sputum 3/15 >> possible nocardia Sputum 3/12 >> negative Sputum 3/24 >>neg Blood 3/24 >> NEG  Tests: 3/05 Echo >> EF 55%, PAS 37 mmHg 3/21 CT chest >> b/l emphysema, improved RUL cavitary lesion, Lt apical scar, small b/l effusions and b/l lower lobe ASD  Subjective: Confortable on ATC. Cognition appears intact.  Mouths - "I am going home".  RN reports pt has not tolerated PMV due to desaturations.   VITAL SIGNS: Vital signs reviewed.  PHYSICAL EXAMINATION: General: Cachetic, NAD Neuro: Awake, alert, MAE HEENT:  Trach site clean, macroglossia resolved.  Minimal creamy secretions.   Cardiovascular: tachycardic Lungs: b/l rhonchi, no wheeze Abdomen:  Soft, non tender Ext: no edema   LABS:  Recent Labs Lab 05/16/12 0500 05/20/12 0558  NA 131* 128*  K 3.6 4.0  CL 97 88*  CO2 27 31  BUN 4* 10  CREATININE 0.36* 0.41*  GLUCOSE 116* 80    Recent Labs Lab 05/16/12 0500 05/20/12 0558  HGB 10.9* 12.3  HCT 31.0* 34.9*  WBC 11.5* 10.2  PLT 264 197    Imaging: No results found.   ASSESSMENT  A:  Acute on chronic respiratory failure 2nd to resolving PNA with hx of severe COPD/emphysema.   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  Has been doing TC during the day and vent at night  x2 wks.  P: - Changed to cuffless 4 and on RA, tolerated well, will cap during the day and open at night today then change to cap x48 hours, if tolerated then decannulate. - Continue BDs. - Continue PMV efforts.  Macroglossia after tongue biting >> Resolved 4/4.   P: - Monitor/supportive care.  Failure to thrive, protein calorie malnutrition, decondition - This is likely contributing to difficulties with vent weaning. P: Nutrition per primary team  Anemia. P: F/u CBC per primary team  Alyson Reedy, M.D. [facility] Pulmonary/Critical Care Medicine. Pager: [phone]. After hours pager: [phone].

## 2012-05-22 LAB — BASIC METABOLIC PANEL
CO2: 34 mEq/L — ABNORMAL HIGH (ref 19–32)
Calcium: 9.5 mg/dL (ref 8.4–10.5)
Chloride: 88 mEq/L — ABNORMAL LOW (ref 96–112)
Creatinine, Ser: 0.37 mg/dL — ABNORMAL LOW (ref 0.50–1.10)
GFR calc Af Amer: >90 mL/min (ref >90)
Sodium: 130 mEq/L — ABNORMAL LOW (ref 135–145)

## 2012-05-23 LAB — CBC
HCT: 25.9 % — ABNORMAL LOW (ref 36.0–46.0)
MCHC: 34.7 g/dL (ref 30.0–36.0)
MCV: 78 fL (ref 78.0–100.0)
Platelets: 214 10*3/uL (ref 150–400)
RDW: 17 % — ABNORMAL HIGH (ref 11.5–15.5)

## 2012-05-23 NOTE — Progress Notes (Signed)
PULMONARY  / CRITICAL CARE MEDICINE  Name: Jennifer Hurley MRN: [id number] DOB: 1955-06-09    ADMISSION DATE:  04/25/2012 CONSULTATION DATE: 3-18   REFERRING MD :  [facility]  CHIEF COMPLAINT:  VDRF  BRIEF PATIENT DESCRIPTION:  57 yo female smoker admitted to [facility] 3/04 with AECOPD, FTT, VDRF.  Treated with systemic steroids, inhaled bronchodilators, and empiric antibiotics. Course complicated by agitated delirium and tongue biting with macroglossia 3/12. S/p trach 3/14.  Trach trials started 3/17 and transfered to [facility].  PCCM following for trach/pulm mgmt  Cultures: Sputum 3/07 >> moderate Group A Streptococcus Sputum 3/13 >> few candida  Sputum 3/15 >> possible nocardia Sputum 3/12 >> negative Sputum 3/24 >>neg Blood 3/24 >> NEG  Tests: 3/05 Echo >> EF 55%, PAS 37 mmHg 3/21 CT chest >> b/l emphysema, improved RUL cavitary lesion, Lt apical scar, small b/l effusions and b/l lower lobe ASD  Subjective: Confortable on ATC - capped overnight. Cognition appears intact.  Speech clear. VITAL SIGNS: Vital signs reviewed.  PHYSICAL EXAMINATION: General: Cachetic, NAD Neuro: Awake, alert, MAE HEENT:  Trach site clean, macroglossia resolved.  Speech clear, trach capped  Cardiovascular: tachycardic Lungs: b/l rhonchi, no wheeze Abdomen:  Soft, non tender Ext: no edema   LABS:  Recent Labs Lab 05/20/12 0558 05/22/12 0645  NA 128* 130*  K 4.0 3.7  CL 88* 88*  CO2 31 34*  BUN 10 17  CREATININE 0.41* 0.37*  GLUCOSE 80 93    Recent Labs Lab 05/20/12 0558  HGB 12.3  HCT 34.9*  WBC 10.2  PLT 197    Imaging: No results found.   ASSESSMENT  A:  Acute on chronic respiratory failure 2nd to resolving PNA with hx of severe COPD/emphysema.   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  Has been doing TC during the day and vent at night x2 wks.  P: - Changed to cuffless 4 and on RA, tolerated well, will cap during the day and  open at night today then change to cap x48 hours, if tolerated then decannulate. Will be 48 hrs 4-15 - Continue BDs. - Continue PMV efforts. -de cannulate 4-15  -Unclear cause of leucocytosis - WC rising from 10 to 18  Macroglossia after tongue biting >> Resolved 4/4.   P: - Monitor/supportive care.  Failure to thrive, protein calorie malnutrition, decondition - This is likely contributing to difficulties with vent weaning. P: Nutrition per primary team  Anemia. P: F/u CBC per primary team  Brett Canales Minor ACNP Ad[facility] Pollack PCCM Pager [phone] till 3 pm If no answer page [phone]  Independently examined pt, evaluated data & formulated above care plan with NP  [facility] V.  05/23/2012, 10:34 AM

## 2012-05-25 ENCOUNTER — Other Ambulatory Visit (HOSPITAL_COMMUNITY): Payer: Self-pay

## 2012-05-25 LAB — URINALYSIS, ROUTINE W REFLEX MICROSCOPIC
Hgb urine dipstick: NEGATIVE
Nitrite: NEGATIVE
Protein, ur: NEGATIVE mg/dL
Urobilinogen, UA: 0.2 mg/dL (ref 0.0–1.0)

## 2012-05-25 LAB — BASIC METABOLIC PANEL
Calcium: 9 mg/dL (ref 8.4–10.5)
GFR calc Af Amer: >90 mL/min (ref >90)
GFR calc non Af Amer: >90 mL/min (ref >90)
Glucose, Bld: 109 mg/dL — ABNORMAL HIGH (ref 70–99)
Sodium: 133 mEq/L — ABNORMAL LOW (ref 135–145)

## 2012-05-25 LAB — CBC
MCH: 28.1 pg (ref 26.0–34.0)
MCHC: 35.9 g/dL (ref 30.0–36.0)
Platelets: 209 10*3/uL (ref 150–400)
RDW: 17.3 % — ABNORMAL HIGH (ref 11.5–15.5)

## 2012-05-25 LAB — URINE MICROSCOPIC-ADD ON

## 2012-05-25 NOTE — Progress Notes (Signed)
PULMONARY  / CRITICAL CARE MEDICINE  Name: Jennifer Hurley MRN: [id number] DOB: Apr 14, 1955    ADMISSION DATE:  04/25/2012 CONSULTATION DATE: 3-18   REFERRING MD :  [facility]  CHIEF COMPLAINT:  VDRF  BRIEF PATIENT DESCRIPTION:  57 yo female smoker admitted to [facility] 3/04 with AECOPD,CAP, FTT, VDRF.  Treated with systemic steroids, inhaled bronchodilators, and empiric antibiotics. Course complicated by agitated delirium and tongue biting with macroglossia 3/12. S/p trach 3/14.  Trach trials started 3/17 and transfered to [facility].  PCCM following for trach/pulm mgmt  Cultures: Sputum 3/07 >> moderate Group A Streptococcus Sputum 3/13 >> few candida  Sputum 3/15 >> possible nocardia Sputum 3/12 >> negative Sputum 3/24 >>neg Blood 3/24 >> NEG  Tests: 3/05 Echo >> EF 55%, PAS 37 mmHg 3/21 CT chest >> b/l emphysema, improved RUL cavitary lesion, Lt apical scar, small b/l effusions and b/l lower lobe ASD  Subjective: Confortable on ATC - capped x 72h . Cognition appears intact.  Speech clear. Progressing well with PT  VITAL SIGNS: Vital signs reviewed.  PHYSICAL EXAMINATION: General: Cachetic, NAD Neuro: Awake, alert, MAE HEENT:  Trach site clean, macroglossia resolved.  Speech clear, trach capped  Cardiovascular: regular RR Lungs: b/l rhonchi, no wheeze Abdomen:  Soft, non tender Ext: no edema   LABS:  Recent Labs Lab 05/20/12 0558 05/22/12 0645 05/25/12 0550  NA 128* 130* 133*  K 4.0 3.7 3.6  CL 88* 88* 94*  CO2 31 34* 31  BUN 10 17 14   CREATININE 0.41* 0.37* 0.38*  GLUCOSE 80 93 109*    Recent Labs Lab 05/20/12 0558 05/23/12 1000 05/25/12 0550  HGB 12.3 9.0* 7.9*  HCT 34.9* 25.9* 22.0*  WBC 10.2 18.5* 10.0  PLT 197 214 209    Imaging: Dg Chest Port 1 View  05/25/2012  *RADIOLOGY REPORT*  Clinical Data:  Fever  PORTABLE CHEST - 1 VIEW  Comparison: Portable exam 0647 hours compared to 05/16/2012 Correlation:  CT chest 04/29/2012, 02/14/2012  Findings: Tracheostomy  tube with tip projecting over tracheal air column 7.5 cm above carina. Normal heart size, mediastinal contours, and pulmonary vascularity. Emphysematous changes with biapical scarring right greater than left with mild right apical bullous versus cavitary changes. Improved aeration at right base though an area of focal opacity 4 x 1 x 2.1 cm persists, could represent residual consolidation, round atelectasis, or mass with no diaphragmatic hernia or definite mass/nodule seen on prior CTs. Mild residual atelectasis at left base. Lungs otherwise clear. Vague ovoid density projects over the mid left lung question EKG lead. No definite pleural effusion or pneumothorax. Bones demineralized.  IMPRESSION: Changes of COPD with biapical scarring bullous changes greater on the right. Focal area residual opacity at the right lung base could represent residual infiltrate, rounded atelectasis, less likely mass/nodule; recommend follow-up exams until resolution to exclude pulmonary mass/nodule.   Original Report Authenticated By: Ulyses Southward, M.D.      ASSESSMENT  A:  Acute on chronic respiratory failure 2nd to resolving PNA with hx of severe COPD/emphysema.   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    P: - decannulate - Continue BDs. -RLL ASD will need FU to resolution, will need out pt FU with pulmonary  -Unclear cause of leucocytosis - WC rising from 10 to 18 -back down now ? Spurious value   Macroglossia after tongue biting >> Resolved 4/4.   P: - Monitor/supportive care.  Failure  to thrive, protein calorie malnutrition, decondition - This is likely contributing to difficulties with vent weaning. P: Nutrition per primary team  Anemia. P: F/u CBC per primary team    Sie Formisano V.  05/25/2012, 10:48 AM

## 2012-05-26 LAB — CREATININE, SERUM
GFR calc Af Amer: >90 mL/min (ref >90)
GFR calc non Af Amer: >90 mL/min (ref >90)

## 2012-05-26 NOTE — Progress Notes (Signed)
PULMONARY  / CRITICAL CARE MEDICINE  Name: Jennifer Hurley MRN: [id number] DOB: Jun 23, 1955    ADMISSION DATE:  04/25/2012 CONSULTATION DATE: 3-18   REFERRING MD :  [facility]  CHIEF COMPLAINT:  VDRF  BRIEF PATIENT DESCRIPTION:  57 yo female smoker admitted to [facility] 3/04 with AECOPD,CAP, FTT, VDRF.  Treated with systemic steroids, inhaled bronchodilators, and empiric antibiotics. Course complicated by agitated delirium and tongue biting with macroglossia 3/12. S/p trach 3/14.  Trach trials started 3/17 and transfered to [facility].  PCCM following for trach/pulm mgmt  Cultures: Sputum 3/07 >> moderate Group A Streptococcus Sputum 3/13 >> few candida  Sputum 3/15 >> possible nocardia Sputum 3/12 >> negative Sputum 3/24 >>neg Blood 3/24 >> NEG  Tests: 3/05 Echo >> EF 55%, PAS 37 mmHg 3/21 CT chest >> b/l emphysema, improved RUL cavitary lesion, Lt apical scar, small b/l effusions and b/l lower lobe ASD 4/16 decannulated  Subjective: No dyspnea   Speech clear. Progressing well with PT  VITAL SIGNS: Vital signs reviewed.  PHYSICAL EXAMINATION: General: Cachetic, NAD Neuro: Awake, alert, MAE HEENT:  Trach site clean, macroglossia resolved.  Speech clear, trach out Cardiovascular: regular RR Lungs: b/l rhonchi, no wheeze Abdomen:  Soft, non tender Ext: no edema   LABS:  Recent Labs Lab 05/20/12 0558 05/22/12 0645 05/25/12 0550 05/26/12 0620  NA 128* 130* 133*  --   K 4.0 3.7 3.6  --   CL 88* 88* 94*  --   CO2 31 34* 31  --   BUN 10 17 14 11   CREATININE 0.41* 0.37* 0.38* 0.37*  GLUCOSE 80 93 109*  --     Recent Labs Lab 05/20/12 0558 05/23/12 1000 05/25/12 0550  HGB 12.3 9.0* 7.9*  HCT 34.9* 25.9* 22.0*  WBC 10.2 18.5* 10.0  PLT 197 214 209    Imaging: Dg Chest Port 1 View  05/25/2012  *RADIOLOGY REPORT*  Clinical Data:  Fever  PORTABLE CHEST - 1 VIEW  Comparison: Portable exam 0647 hours compared to 05/16/2012 Correlation:  CT chest 04/29/2012, 02/14/2012   Findings: Tracheostomy tube with tip projecting over tracheal air column 7.5 cm above carina. Normal heart size, mediastinal contours, and pulmonary vascularity. Emphysematous changes with biapical scarring right greater than left with mild right apical bullous versus cavitary changes. Improved aeration at right base though an area of focal opacity 4 x 1 x 2.1 cm persists, could represent residual consolidation, round atelectasis, or mass with no diaphragmatic hernia or definite mass/nodule seen on prior CTs. Mild residual atelectasis at left base. Lungs otherwise clear. Vague ovoid density projects over the mid left lung question EKG lead. No definite pleural effusion or pneumothorax. Bones demineralized.  IMPRESSION: Changes of COPD with biapical scarring bullous changes greater on the right. Focal area residual opacity at the right lung base could represent residual infiltrate, rounded atelectasis, less likely mass/nodule; recommend follow-up exams until resolution to exclude pulmonary mass/nodule.   Original Report Authenticated By: Ulyses Southward, M.D.      ASSESSMENT  A:  Acute on chronic respiratory failure 2nd to resolving PNA with hx of severe COPD/emphysema.   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    P: - decannulated, doing well - Continue BDs. -RLL ASD will need FU to resolution, will need out pt FU with pulmonary  -Unclear cause of leucocytosis - WC rising from 10 to 18 -back down now ? Spurious value   Macroglossia  after tongue biting >> Resolved 4/4.   P: - Monitor/supportive care.  Failure to thrive, protein calorie malnutrition, decondition - This is likely contributing to difficulties with vent weaning. P: Nutrition per primary team  Anemia. P: F/u CBC per primary team  PCCM to sign off    ALVA,RAKESH V.  05/26/2012, 10:34 AM

## 2012-05-27 ENCOUNTER — Other Ambulatory Visit (HOSPITAL_COMMUNITY): Payer: Self-pay

## 2012-05-27 LAB — TYPE AND SCREEN
ABO/RH(D): AB POS
Antibody Screen: NEGATIVE
Unit division: 0

## 2012-05-27 LAB — CBC
MCV: 77.9 fL — ABNORMAL LOW (ref 78.0–100.0)
Platelets: 226 10*3/uL (ref 150–400)
RBC: 2.76 MIL/uL — ABNORMAL LOW (ref 3.87–5.11)
WBC: 6.5 10*3/uL (ref 4.0–10.5)

## 2012-05-27 LAB — BASIC METABOLIC PANEL
CO2: 28 mEq/L (ref 19–32)
Chloride: 95 mEq/L — ABNORMAL LOW (ref 96–112)
Sodium: 132 mEq/L — ABNORMAL LOW (ref 135–145)

## 2012-05-27 LAB — MAGNESIUM: Magnesium: 1.9 mg/dL (ref 1.5–2.5)

## 2012-05-28 LAB — HEMOGLOBIN AND HEMATOCRIT, BLOOD
HCT: 21.1 % — ABNORMAL LOW (ref 36.0–46.0)
Hemoglobin: 7.6 g/dL — ABNORMAL LOW (ref 12.0–15.0)

## 2012-05-28 LAB — HEMOGLOBIN A1C: Hgb A1c MFr Bld: 5.5 % (ref <5.7)

## 2012-05-29 LAB — CBC WITH DIFFERENTIAL/PLATELET
Basophils Relative: 0 % (ref 0–1)
Eosinophils Relative: 1 % (ref 0–5)
HCT: 20.8 % — ABNORMAL LOW (ref 36.0–46.0)
Hemoglobin: 7.4 g/dL — ABNORMAL LOW (ref 12.0–15.0)
MCH: 27.6 pg (ref 26.0–34.0)
MCHC: 35.6 g/dL (ref 30.0–36.0)
MCV: 77.6 fL — ABNORMAL LOW (ref 78.0–100.0)
Monocytes Absolute: 0.6 10*3/uL (ref 0.1–1.0)
Monocytes Relative: 8 % (ref 3–12)
Neutro Abs: 3.9 10*3/uL (ref 1.7–7.7)

## 2012-05-29 LAB — COMPREHENSIVE METABOLIC PANEL
Albumin: 3 g/dL — ABNORMAL LOW (ref 3.5–5.2)
BUN: 9 mg/dL (ref 6–23)
Calcium: 9.3 mg/dL (ref 8.4–10.5)
Chloride: 97 mEq/L (ref 96–112)
Creatinine, Ser: 0.32 mg/dL — ABNORMAL LOW (ref 0.50–1.10)
GFR calc non Af Amer: >90 mL/min (ref >90)
Total Bilirubin: 0.2 mg/dL — ABNORMAL LOW (ref 0.3–1.2)

## 2012-05-30 LAB — BASIC METABOLIC PANEL
BUN: 13 mg/dL (ref 6–23)
Creatinine, Ser: 0.41 mg/dL — ABNORMAL LOW (ref 0.50–1.10)
GFR calc Af Amer: >90 mL/min (ref >90)
GFR calc non Af Amer: >90 mL/min (ref >90)

## 2012-05-30 LAB — CULTURE, BLOOD (ROUTINE X 2): Culture: NO GROWTH

## 2012-05-30 LAB — CBC
HCT: 31 % — ABNORMAL LOW (ref 36.0–46.0)
MCHC: 36.1 g/dL — ABNORMAL HIGH (ref 30.0–36.0)
MCV: 79.7 fL (ref 78.0–100.0)
RDW: 15.8 % — ABNORMAL HIGH (ref 11.5–15.5)

## 2012-05-31 ENCOUNTER — Other Ambulatory Visit (HOSPITAL_COMMUNITY): Payer: Self-pay

## 2012-05-31 LAB — TYPE AND SCREEN
ABO/RH(D): AB POS
Antibody Screen: NEGATIVE
Unit division: 0

## 2012-06-02 LAB — BASIC METABOLIC PANEL
CO2: 23 mEq/L (ref 19–32)
Calcium: 9.4 mg/dL (ref 8.4–10.5)
Creatinine, Ser: 0.36 mg/dL — ABNORMAL LOW (ref 0.50–1.10)

## 2012-06-02 LAB — CBC
MCH: 28.5 pg (ref 26.0–34.0)
MCV: 80.2 fL (ref 78.0–100.0)
Platelets: 366 10*3/uL (ref 150–400)
RDW: 16.8 % — ABNORMAL HIGH (ref 11.5–15.5)

## 2012-06-07 ENCOUNTER — Non-Acute Institutional Stay (SKILLED_NURSING_FACILITY): Payer: Medicare Other | Admitting: Internal Medicine

## 2012-06-07 DIAGNOSIS — J441 Chronic obstructive pulmonary disease with (acute) exacerbation: Secondary | ICD-10-CM

## 2012-06-07 DIAGNOSIS — R079 Chest pain, unspecified: Secondary | ICD-10-CM

## 2012-06-07 NOTE — Progress Notes (Signed)
Patient ID: Jennifer Hurley, female   DOB: Jun 28, 1955, 57 y.o.   MRN: [id number] Chief complaint; chest pain.  History; his vision is a recent admission to hear from [employer] at [place]. Revisit this as she was at [facility] with cavitary right upper lobe pneumonia, respiratory failure requiring intubation. She has a history of severe emphysema. [facility]. She was decannulated and has been oxygenating at 97-99% on room air. Also noted to have severe protein calorie malnutrition and has a PEG tube, she is eating on top of this and my understanding is she is now off the PEG tube as far as nutrition is concerned.  Today while lying in bed. She experienced a retrosternal chest tightness. This was nonradiating, associated with increased shortness of breath, nausea, vomiting, or diaphoresis. She was given 2 nitroglycerin 5 minutes apart by a nurse, as part of our standing orders. This resulted in complete resolution of the discomfort. She does not have a history of similar pain, although she tells me she had a heart attack many years ago. I do not see anything on her recent records with regards to coronary artery disease. She apparently has a history of congestive heart failure and is on Lasix. An echocardiogram done in March 2014 showed an EF of 55%, no regional wall motion abnormalities, normal right ventricular function.  Review of systems; Respiratory status or shortness of breath is at baseline. No cough no pleuritic chest. Cardiac see history of present illness. She does not describe exertional chest. GI no dysphagia no reflux symptoms.  Physical exam; blood pressure 130/80, pulse 100, temperature 99.6, respirations 18 Respiratory very shallow, but clear bilaterally. No wheezing, no accessory muscle use, or her breathing is normal. Cardiac heart sounds are distant no murmurs, no S4. No elevation in jugular venous pressure. Abdomen no  liver no spleen no tenderness. Extremities no evidence of DVT.  Impression/plan #1 chest pain. This occurred at rest and was relieved with Maalox 30 cc and subsequently 2 nitroglycerin sublingual. I will get an EKG and lab work on her. Follow up on this later this week. Certainly, possible new onset angina, although the patient tells me she had a heart attack several years ago, this did not seem all documented on recent echo. #2. #2 COPD this is severe, but stable

## 2012-06-10 ENCOUNTER — Non-Acute Institutional Stay (SKILLED_NURSING_FACILITY): Payer: Medicare Other | Admitting: Internal Medicine

## 2012-06-10 DIAGNOSIS — J438 Other emphysema: Secondary | ICD-10-CM

## 2012-06-10 DIAGNOSIS — D638 Anemia in other chronic diseases classified elsewhere: Secondary | ICD-10-CM

## 2012-06-10 DIAGNOSIS — I509 Heart failure, unspecified: Secondary | ICD-10-CM

## 2012-06-10 DIAGNOSIS — I5022 Chronic systolic (congestive) heart failure: Secondary | ICD-10-CM

## 2012-06-10 DIAGNOSIS — J439 Emphysema, unspecified: Secondary | ICD-10-CM

## 2012-06-10 DIAGNOSIS — E43 Unspecified severe protein-calorie malnutrition: Secondary | ICD-10-CM

## 2012-06-15 ENCOUNTER — Non-Acute Institutional Stay (SKILLED_NURSING_FACILITY): Payer: Medicare Other | Admitting: Internal Medicine

## 2012-06-15 DIAGNOSIS — R350 Frequency of micturition: Secondary | ICD-10-CM

## 2012-06-18 ENCOUNTER — Non-Acute Institutional Stay (SKILLED_NURSING_FACILITY): Payer: Medicare Other | Admitting: Internal Medicine

## 2012-06-18 DIAGNOSIS — J441 Chronic obstructive pulmonary disease with (acute) exacerbation: Secondary | ICD-10-CM

## 2012-06-18 DIAGNOSIS — E43 Unspecified severe protein-calorie malnutrition: Secondary | ICD-10-CM

## 2012-06-21 ENCOUNTER — Other Ambulatory Visit (HOSPITAL_BASED_OUTPATIENT_CLINIC_OR_DEPARTMENT_OTHER): Payer: Self-pay | Admitting: Internal Medicine

## 2012-06-21 ENCOUNTER — Non-Acute Institutional Stay (SKILLED_NURSING_FACILITY): Payer: Medicare Other | Admitting: Internal Medicine

## 2012-06-21 DIAGNOSIS — R633 Feeding difficulties: Secondary | ICD-10-CM

## 2012-06-21 DIAGNOSIS — J441 Chronic obstructive pulmonary disease with (acute) exacerbation: Secondary | ICD-10-CM

## 2012-06-21 DIAGNOSIS — E43 Unspecified severe protein-calorie malnutrition: Secondary | ICD-10-CM

## 2012-06-21 NOTE — Progress Notes (Signed)
Patient ID: Jennifer Hurley, female   DOB: 02-25-1955, 57 y.o.   MRN: [id number] Chief Complaint: pre discharge review  History: Patient came to [place] after a stay at [facility]. Previously at [facility] with a history of COPD acute requiring intubation. She was sent to [facility] for weaning adn therapy. She required a Peg tube due to sever Protein calorie malnutrition. I put her nightime tube feeding on hold last Friday. She is eating well and has gained weight. BP at bit low at 105 systolic. She has a history of alcohol, tobacco and substance abuse. She is going home tomorrow. The peg but will be removed tomorrow.   Physical Exam;  Resp: shallow but clear, no wheezing CVS Hs are normal. Appears euvolemic.  Impressions/ plan COPd: likely severe but stable. Will not require O2, stats on room air at 95% Protein calorie Malnutrition; She is eating "well". Peg will be removed tomorrow. SN to keep eye on the sight Hx of substance abuse: Cannot go home on Fentanyl, will d/c  PT/OT/SN/ST. Rolling walker??

## 2012-06-22 ENCOUNTER — Ambulatory Visit (HOSPITAL_COMMUNITY)
Admission: RE | Admit: 2012-06-22 | Discharge: 2012-06-22 | Disposition: A | Discharge status: Home / Self Care | Payer: Medicare Other | Source: Ambulatory Visit | Attending: Internal Medicine | Admitting: Internal Medicine

## 2012-06-22 ENCOUNTER — Non-Acute Institutional Stay (SKILLED_NURSING_FACILITY): Payer: Medicare Other | Admitting: Internal Medicine

## 2012-06-22 DIAGNOSIS — D72829 Elevated white blood cell count, unspecified: Secondary | ICD-10-CM

## 2012-06-22 DIAGNOSIS — E441 Mild protein-calorie malnutrition: Secondary | ICD-10-CM | POA: Insufficient documentation

## 2012-06-22 DIAGNOSIS — Z431 Encounter for attention to gastrostomy: Secondary | ICD-10-CM | POA: Insufficient documentation

## 2012-06-22 DIAGNOSIS — R633 Feeding difficulties: Secondary | ICD-10-CM

## 2012-06-22 NOTE — Procedures (Signed)
Successful g tube removal No comp Stable Full report in pacs

## 2012-06-23 LAB — CULTURE, RESPIRATORY W GRAM STAIN

## 2012-06-24 ENCOUNTER — Telehealth: Payer: Self-pay

## 2012-06-24 NOTE — Progress Notes (Shared)
Patient ID: Jennifer Hurley, female   DOB: 13-May-1955, 57 y.o.   MRN: [id number]           PROGRESS NOTE  DATE:  06/17/2012  FACILITY: [facility]   LEVEL OF CARE:   SNF   Acute Visit   CHIEF COMPLAINT:  Nutritional issues, discharge planning.    HISTORY OF PRESENT ILLNESS:  This is a 57 year-old woman who came into the facility 10 days ago.  She was at [facility] with severe failure to thrive, hypothermia, and worsening shortness of breath that required intubation.  She underwent tracheostomy, placed on 04/22/2012, and subsequently required a PEG tube placement, possibly at [facility].  She came having already had the tracheostomy removed.  She had a PEG tube and has been tapered down to just a feeding at night, although she was eating well and insisting that the PEG tube needs to be removed or she will "rip it out".    REVIEW OF SYSTEMS:   GI:  She does not describe dysphagia or odynophagia.   CHEST/RESPIRATORY:  No excessive shortness of breath or cough.   CARDIAC:   No chest pain.   PHYSICAL EXAMINATION:   GENERAL APPEARANCE:  Slight woman, but appears to be quite well.  Cooperative.   CHEST/RESPIRATORY:  Fairly clear air entry bilaterally in spite of her diagnosis of COPD.  There is no wheezing.   CARDIOVASCULAR:  CARDIAC:   Heart sounds are normal.  There are no murmurs.  She is listed as having chronic systolic heart failure.  There is none of this.   GASTROINTESTINAL:  ABDOMEN:  Her PEG tube is in place.  There is no surrounding infection.  I am doubtful I can remove this in the facility.    ASSESSMENT/PLAN:  Severe protein calorie malnutrition.  She appears to be eating.  I am going to discontinue her nighttime tube feeding and see how she does.  Her albumin level on 06/08/2012 was surprisingly 4.2.  The PEG tube has obviously helped here.    COPD.  This does not appear to be unstable.    Chronic systolic heart failure.  No evidence of this.   History of  substance abuse.    The patient wishes to go to her own home where she is independent.   She is on disability, has Medicaid.  I do not really feel there is a major issue to prevent her from doing this.  I have stopped her tube feeding at night and will attempt to get the PEG tube out before she leaves.   She will need PT, OT and skilled nursing at home.  Other than that, I do not see a major issue here.  Her get-up-and-go test was actually done quite well without the walker.    CPT CODE: 40981

## 2012-06-24 NOTE — Telephone Encounter (Signed)
I spoke with Malachi Bonds at [facility] and she is requesting to speak to Dr. Frederico Hamman about some labs ordered for this patient. According to epic pt is currently in [facility]. I provided Malachi Bonds with Dr. Juanda Crumble contact number. Carron Curie, CMA

## 2012-06-28 ENCOUNTER — Telehealth (HOSPITAL_COMMUNITY): Payer: Self-pay | Admitting: Emergency Medicine

## 2012-06-28 NOTE — ED Notes (Signed)
[facility] calling to give results for pt provided info for Dr Leanord Hawking

## 2012-06-29 DIAGNOSIS — D638 Anemia in other chronic diseases classified elsewhere: Secondary | ICD-10-CM | POA: Insufficient documentation

## 2012-06-29 NOTE — Progress Notes (Addendum)
Patient ID: Jennifer Hurley, female   DOB: May 09, 1955, 57 y.o.   MRN: [id number]        HISTORY & PHYSICAL  DATE: 06/10/2012   FACILITY: [facility]  LEVEL OF CARE: SNF (31)  ALLERGIES:  No Known Allergies  CHIEF COMPLAINT:  Manage severe COPD, anemia of chronic disease, and CHF.    HISTORY OF PRESENT ILLNESS:  The patient is a 57 year-old, African-American female who is admitted from [facility] for short-term rehabilitation.  She has the following problems:   COPD: the COPD remains stable.  Pt denies sob, cough, wheezing or declining exercise tolerance.  No complications from the medications presently being used.   CHF:The patient does not relate significant weight changes, denies sob, DOE, orthopnea, PNDs, pedal edema, palpitations or chest pain.  CHF remains stable.  No complications form the medications being used.   ANEMIA: Patient required 4 U of packed red blood cell transfusion while at [facility].  The anemia has been stable. The patient denies fatigue, melena or hematochezia.  She is currently not on iron.     PAST MEDICAL HISTORY :  Past Medical History  Diagnosis Date  . Rheumatoid arthritis   . Bronchitis   . Myocardial infarction   . Pneumonia   . COPD (chronic obstructive pulmonary disease)   . Chronic respiratory failure   . Hyponatremia   . Alcohol abuse   . Cocaine abuse   . Tobacco abuse    CHF.   Severe protein calorie malnutrition.   Paroxysmal supraventricular tachycardia.    PAST SURGICAL HISTORY: Past Surgical History  Procedure Laterality Date  . Tonsillectomy    . Tubal ligation      SOCIAL HISTORY:  reports that she has been smoking Cigarettes.  She has a 39 pack-year smoking history. She has never used smokeless tobacco. She reports that  drinks alcohol. She reports that she uses illicit drugs (Cocaine and Marijuana).  FAMILY HISTORY:  Family History  Problem Relation Age of Onset  .  Coronary artery disease    . Diabetes type II      CURRENT MEDICATIONS: Reviewed per [facility]  REVIEW OF SYSTEMS:  See HPI otherwise 14 point ROS is negative.  PHYSICAL EXAMINATION  VS:  T 97       P 82      RR 19       BP 130/74    POX%        WT (Lb) 77  GENERAL: no acute distress, thin body habitus SKIN: warm & dry, no suspicious lesions or rashes, no excessive dryness EYES: conjunctivae normal, sclerae normal, normal eye lids MOUTH/THROAT: lips without lesions,no lesions in the mouth,tongue is without lesions,uvula elevates in midline NECK: supple, trachea midline, no neck masses, no thyroid tenderness, no thyromegaly LYMPHATICS: no LAN in the neck, no supraclavicular LAN RESPIRATORY: breathing is even & unlabored, BS CTAB CARDIAC: RRR, no murmur,no extra heart sounds, no edema GI:  ABDOMEN: abdomen soft, normal BS, no masses, no tenderness, the patient has a PEG tube LIVER/SPLEEN: no hepatomegaly, no splenomegaly MUSCULOSKELETAL: HEAD: normal to inspection & palpation BACK: no kyphosis, scoliosis or spinal processes tenderness EXTREMITIES: LEFT UPPER EXTREMITY: full range of motion, normal strength & tone RIGHT UPPER EXTREMITY:  full range of motion, normal strength & tone LEFT LOWER EXTREMITY:  full range of motion, normal strength & tone RIGHT LOWER EXTREMITY:  full range of motion, normal strength & tone PSYCHIATRIC: the patient is  alert & oriented to person, affect & behavior appropriate  LABS/RADIOLOGY: White count 13.1, ANC 7.4, otherwise CBC normal.    CMP normal.   Digoxin level 1.1.  ASSESSMENT/PLAN:  COPD.  Stable.   CHF.  Well compensated.   Anemia of chronic disease.  Hemoglobin has normalized.    Severe protein calorie malnutrition.  Currently on Megace and nocturnal tube feeds.    Dysphagia.  Currently on nocturnal tube feeds.    Severe rheumatoid arthritis.   Currently on Fentanyl patch.    Depression.  Continue Remeron.   Anxiety.  Continue  clonazepam.   Leukocytosis with left shift.  Likely secondary to recent steroid use.  Reassess hemoglobin.    I have reviewed patient's medical records received at admission/from hospitalization.  CPT CODE: 16109

## 2012-07-06 NOTE — Progress Notes (Signed)
Patient ID: DAILY DOE, female   DOB: 1955/06/13, 57 y.o.   MRN: [id number]        PROGRESS NOTE  DATE: 06/15/2012  FACILITY:  [facility]  LEVEL OF CARE: SNF (31)  Acute Visit  CHIEF COMPLAINT:  Manage urinary frequency.   HISTORY OF PRESENT ILLNESS: I was requested by the staff to assess the patient regarding above problem(s):  Patient reports new onset urinary frequency but denies dysuria, hematuria or suprapubic pain.   Patient cannot identify precipitating or alleviating factors.  Symptoms are worse at night.  There are no other associated signs and symptoms.   PAST MEDICAL HISTORY : Reviewed.  No changes.  CURRENT MEDICATIONS: Reviewed per [facility]  REVIEW OF SYSTEMS:  GENERAL: no change in appetite, no fatigue, no weight changes, no fever, chills or weakness RESPIRATORY: no cough, SOB, DOE,, wheezing, hemoptysis CARDIAC: no chest pain, edema or palpitations GI: no abdominal pain, diarrhea, constipation, heart burn, nausea or vomiting  PHYSICAL EXAMINATION  GENERAL: no acute distress, thin body habitus NECK: supple, trachea midline, no neck masses, no thyroid tenderness, no thyromegaly RESPIRATORY: breathing is even & unlabored, BS CTAB CARDIAC: RRR, no murmur,no extra heart sounds, no edema GI: abdomen soft, normal BS, no masses, no tenderness, no hepatomegaly, no splenomegaly PSYCHIATRIC: the patient is alert & oriented to person, affect & behavior appropriate  ASSESSMENT/PLAN:  Urinary frequency.  New problem.  Check urine culture and sensitivities.    CPT CODE: 40981

## 2012-07-12 NOTE — Progress Notes (Signed)
Patient ID: Jennifer Hurley, female   DOB: Dec 12, 1955, 57 y.o.   MRN: [id number]        PROGRESS NOTE  DATE: 06/22/2012  FACILITY:  [facility]  LEVEL OF CARE: SNF (31)  Acute Visit  CHIEF COMPLAINT:  Manage leukocytosis.   HISTORY OF PRESENT ILLNESS: I was requested by the staff to assess the patient regarding above problem(s):  LEUKOCYTOSIS:  On 06/17/2012, patient's white count was 14, ANC 9.1.  On 06/07/2012, white count was 13.1.  Patient denies cough, shortness of breath, or suprapubic pain.  She is  currently not on prednisone.  At discharge from the hospital, white count was 11.3.  PAST MEDICAL HISTORY : Reviewed.  No changes.  CURRENT MEDICATIONS: Reviewed per [facility]  PHYSICAL EXAMINATION  GENERAL: no acute distress, thin body habitus RESPIRATORY: breathing is even & unlabored, BS CTAB CARDIAC: RRR, no murmur,no extra heart sounds, no edema GI: abdomen soft, normal BS, no masses, no tenderness, no hepatomegaly, no splenomegaly  ASSESSMENT/PLAN:  Leukocytosis with left shift.  Unstable problem.  WBC is slowly increasing.  Patient is asymptomatic.  She is scheduled for discharge soon.  Recommend follow-up with primary MD next week for recheck.    CPT CODE: 40981

## 2012-07-28 ENCOUNTER — Other Ambulatory Visit (HOSPITAL_BASED_OUTPATIENT_CLINIC_OR_DEPARTMENT_OTHER): Payer: Self-pay | Admitting: Internal Medicine

## 2014-10-17 IMAGING — DX DG CHEST 1V
1 series · 2 of 2 positions shown · non-contrast
Comparison: 6933 hours the same day and earlier.

CLINICAL DATA: 57-year-old female intubated and central line
placement.

CHEST - 1 VIEW

[Series 1: AP · U · 2 of 2 slices shown]
[im 1/2]
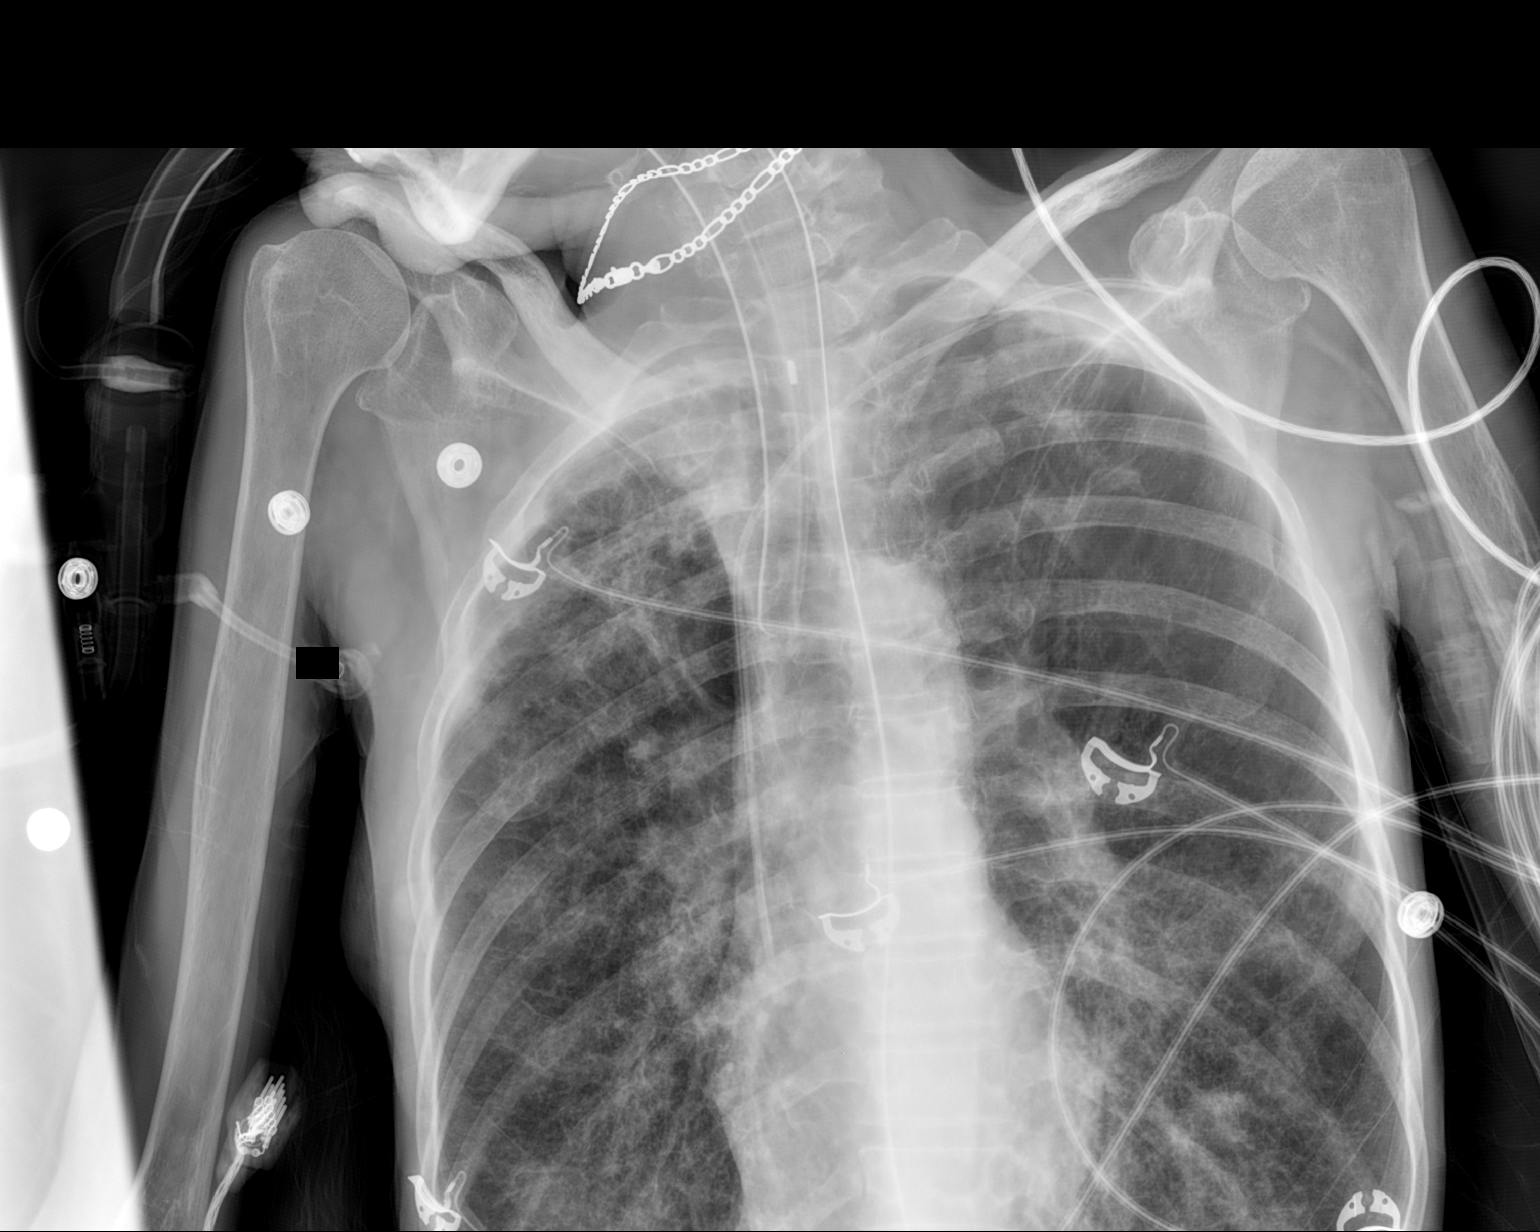
[im 2/2]
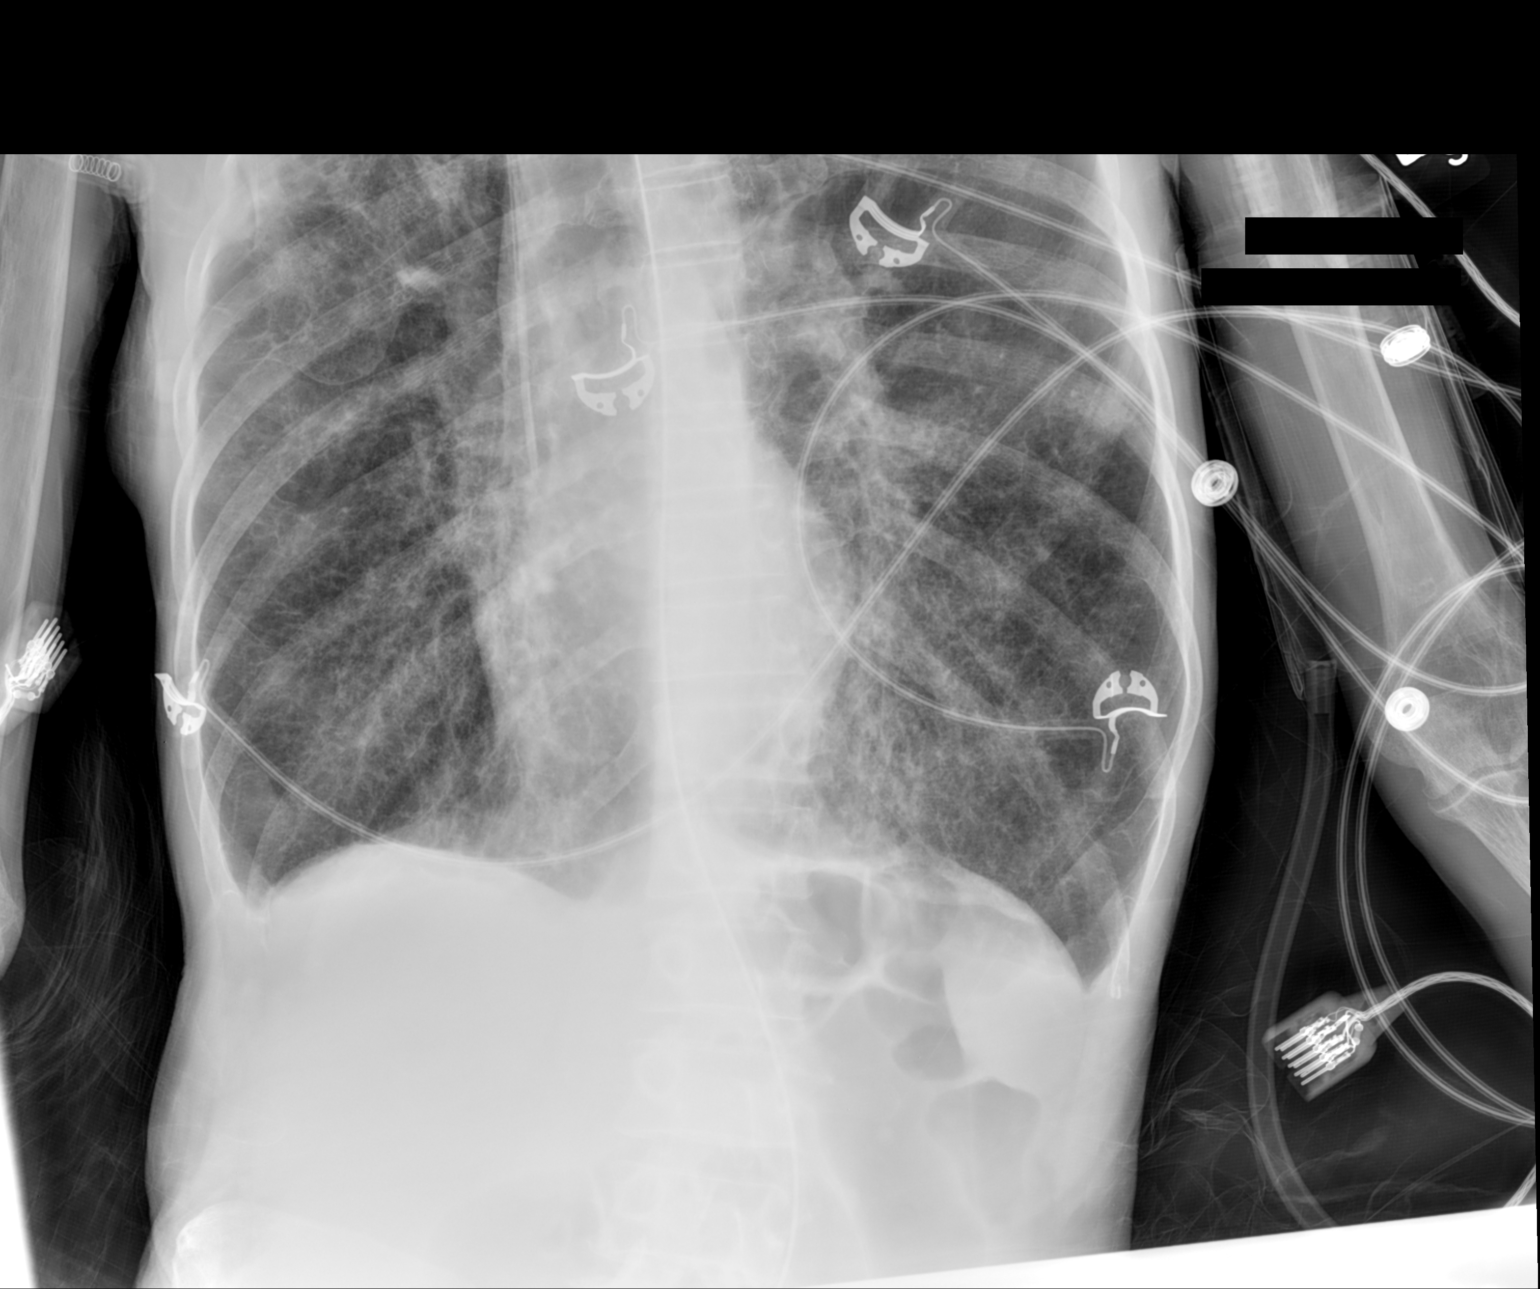

[2 of 2 positions shown; findings below may reference images not displayed]

FINDINGS: Portable semi upright AP views of the chest at 3222
hours.  Endotracheal tube tip in place, projects about halfway
between the level of clavicles and carina.  Left subclavian
approach central line in place, tip at the cavoatrial junction
level.  Enteric tube placed, courses to the left upper quadrant
with tip not included.

Stable large lung volumes and patchy bilateral reticulonodular
density.  Evidence of left upper lobe emphysema.  No pneumothorax,
pleural effusion or pulmonary edema identified.  Stable cardiac
size and mediastinal contours.
IMPRESSION: 1.  Intubated with endotracheal tube and enteric tube in good
position.
2.  Left subclavian central line placed, tip at the cavoatrial
junction level.
3.  Stable lungs.  No pneumothorax.

## 2014-10-19 IMAGING — CR DG CHEST 1V PORT
1 series · 1 of 1 positions shown · non-contrast
Comparison: 04/13/2012

CLINICAL DATA: Acute respiratory failure on ventilator.  COPD

PORTABLE CHEST - 1 VIEW

[AP]
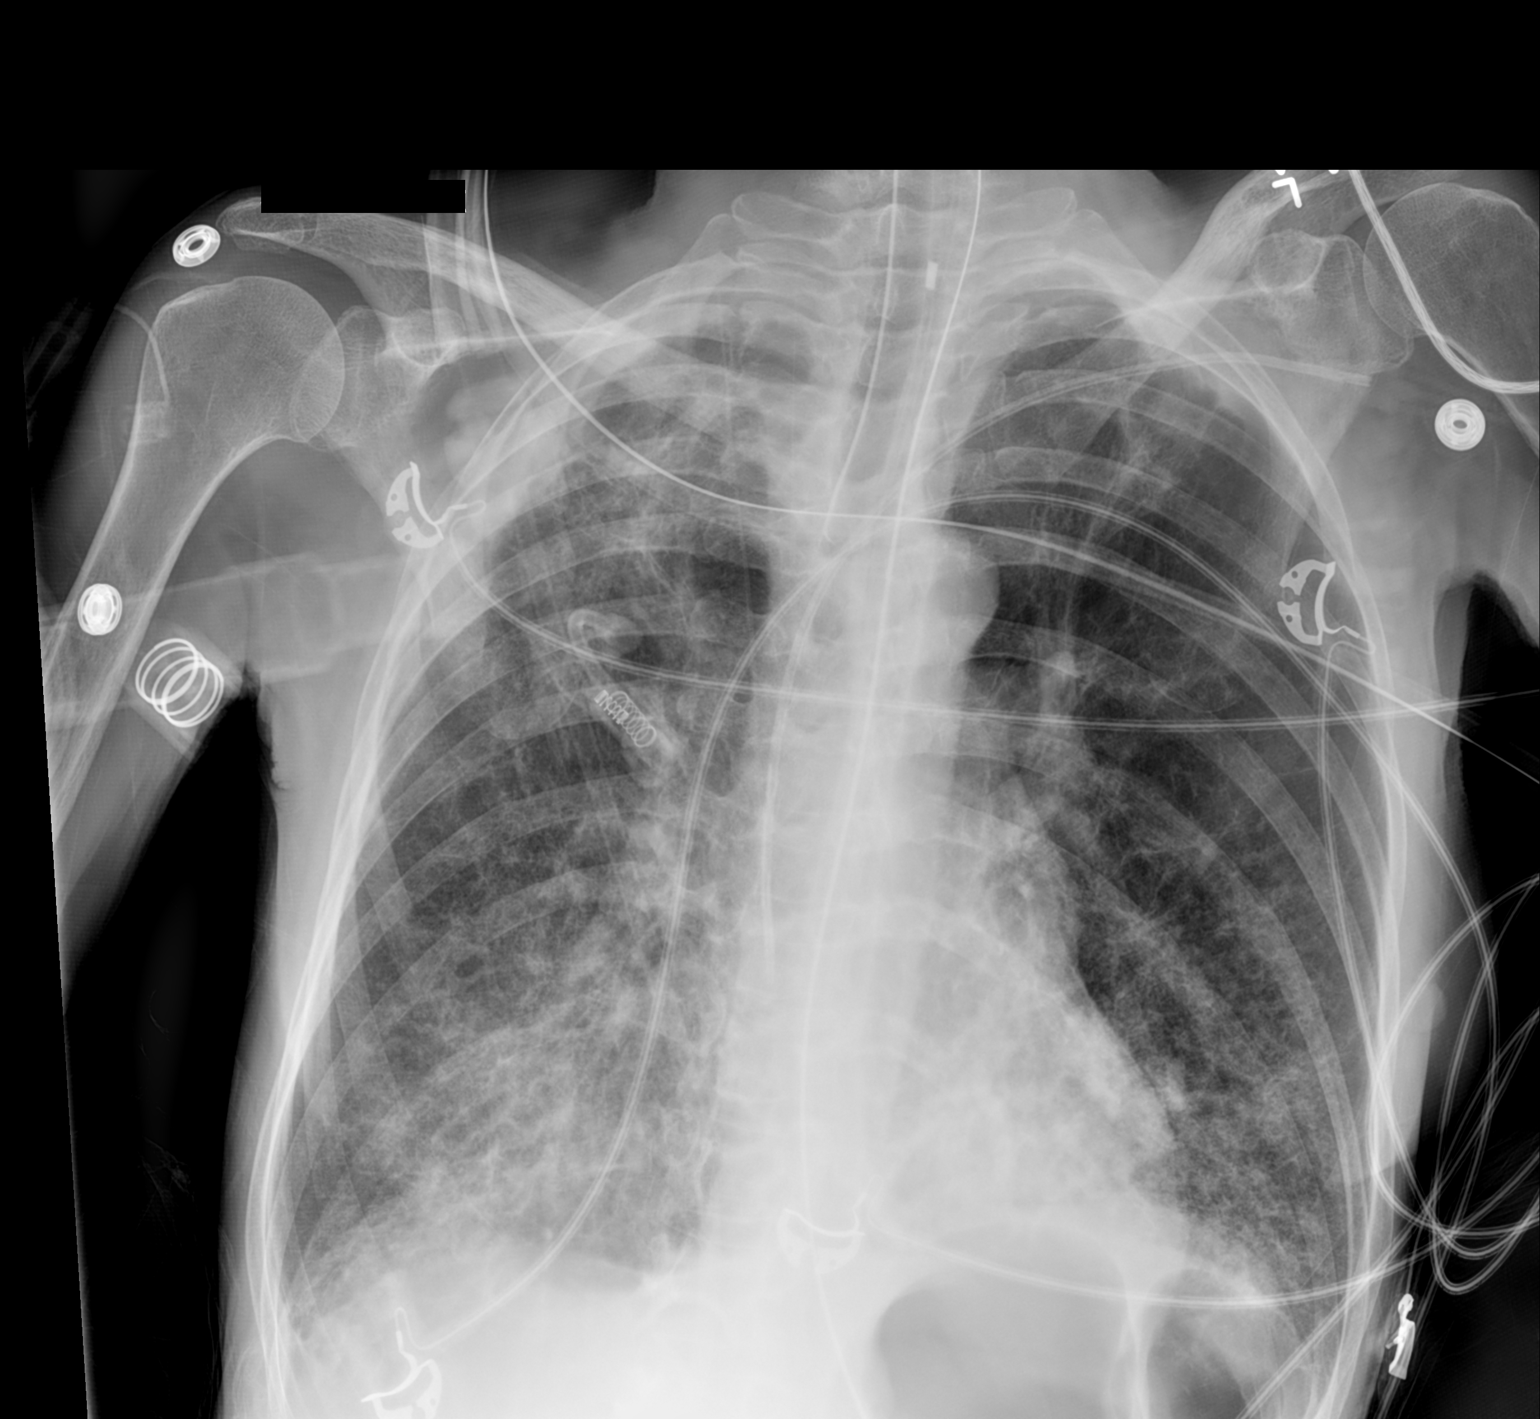

[1 of 1 positions shown; findings below may reference images not displayed]

FINDINGS: The endotracheal tube, left subclavian center venous
catheter and nasogastric tube remain in appropriate position.

Bilateral diffuse air space disease with bibasilar predominance is
again seen superimposed on pulmonary emphysema.  This shows mild
worsening in the right lung base, otherwise there is been no
significant interval change.  No pleural effusion or pneumothorax
identified.  Heart size remains normal.
IMPRESSION: 1.  Support apparatus in appropriate position.
2.  Bilateral airspace disease superimposed on pulmonary emphysema.
3.  Mild worsening of airspace disease noted in the right lung
base.  No other significant interval change.

## 2014-10-31 IMAGING — CR DG ABD PORTABLE 1V
2 series · 2 of 2 positions shown · non-contrast
Comparison: None.

CLINICAL DATA: NG placement.  Second image obtained after
advancement of tube.

PORTABLE ABDOMEN - 1 VIEW

[AP (1 of 2)]
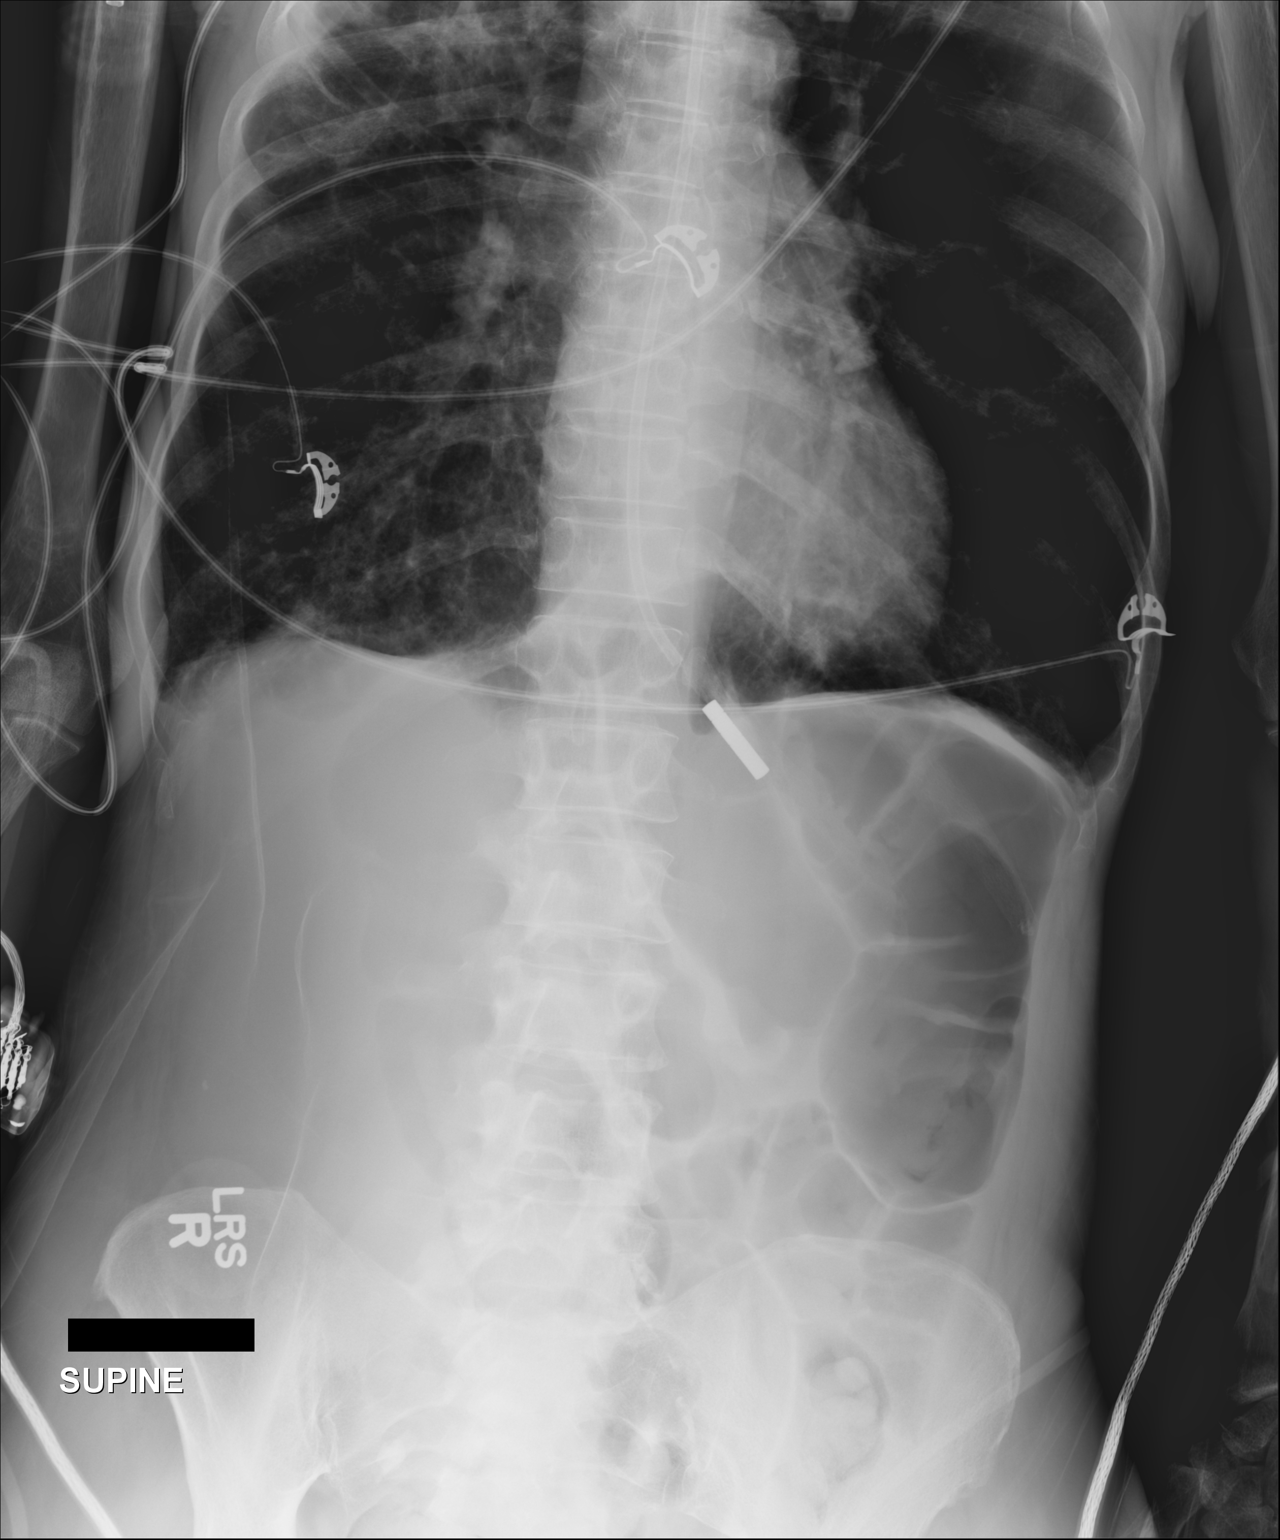

[AP (2 of 2)]
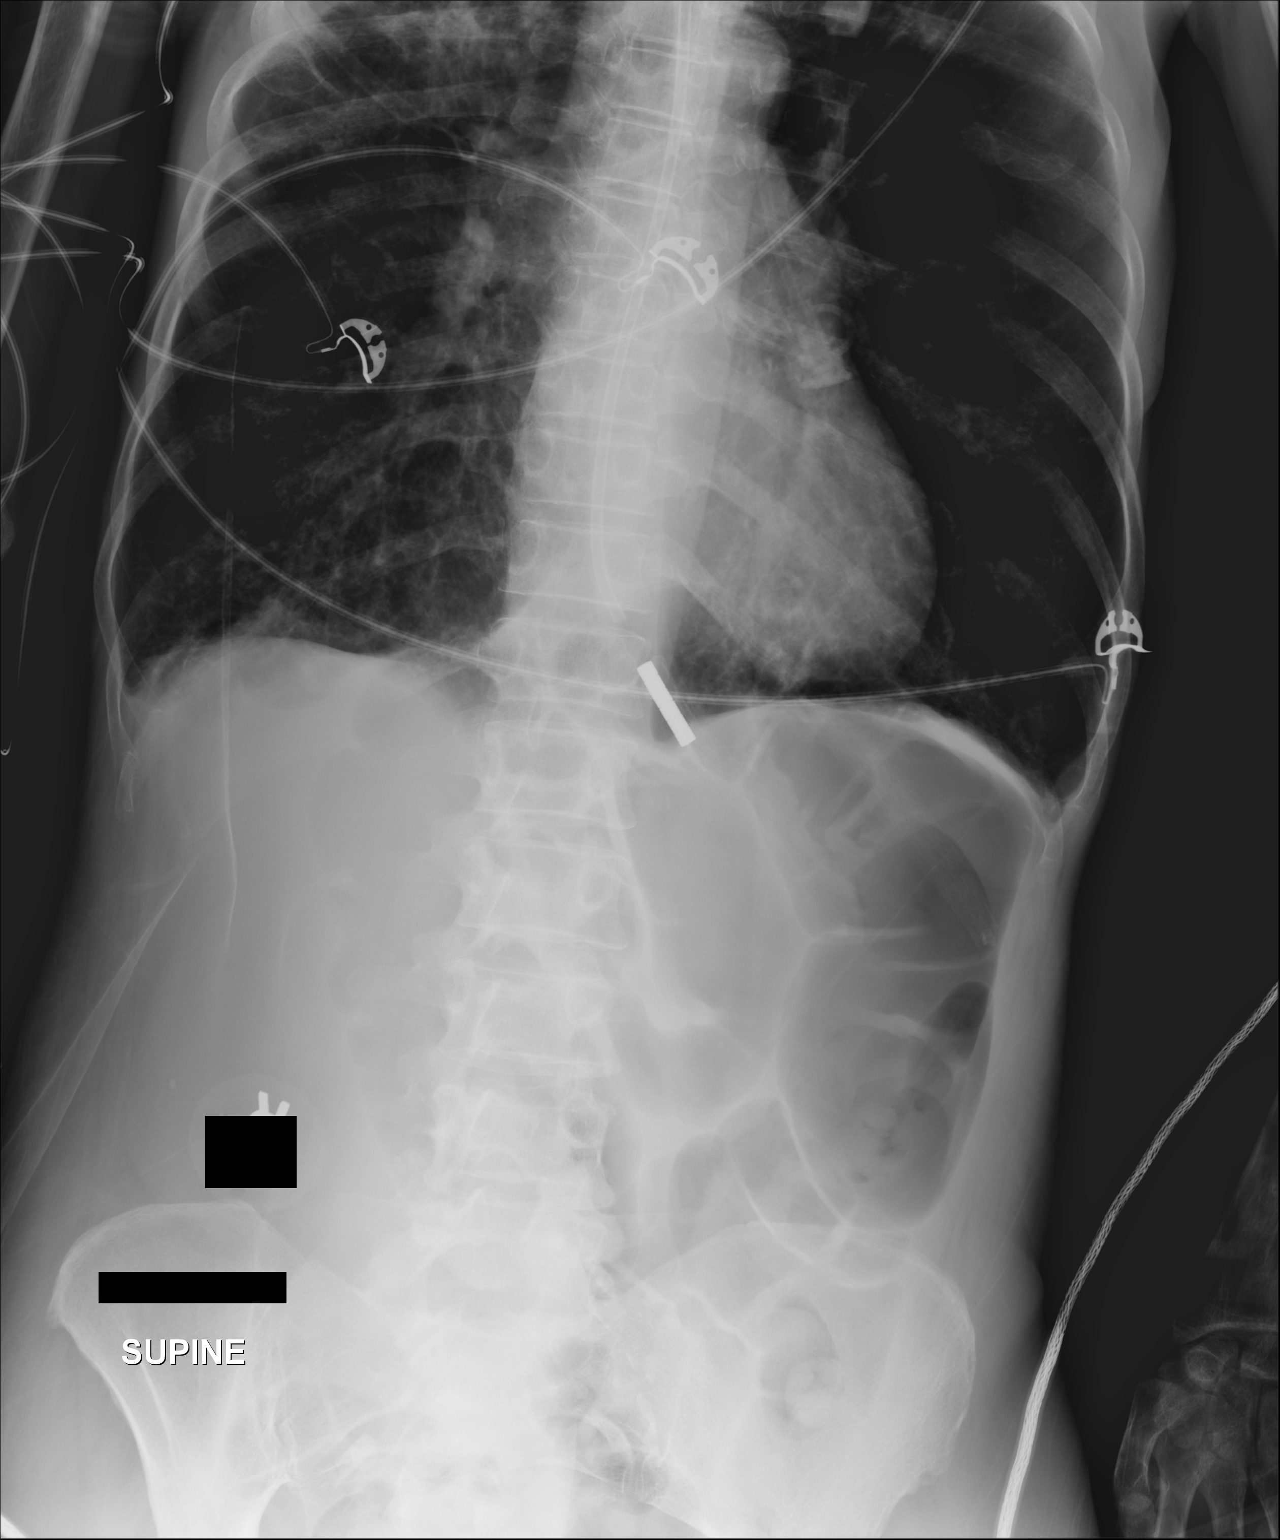

[2 of 2 positions shown; findings below may reference images not displayed]

FINDINGS: Enteric tube is positioned at the EG junction, likely in
the distal esophagus.  Positioning is unchanged between the two
images.  Emphysematous changes and scarring in the lung bases.
Mild prominence of gas filled colon suggesting ileus.
IMPRESSION: Enteric tube placed with tip at the EG junction, likely in the
distal esophagus.

## 2014-10-31 IMAGING — CR DG ABD PORTABLE 1V
1 series · 1 of 1 positions shown · non-contrast
Comparison: 04/26/2012 at 7959 hours

CLINICAL DATA: Enteric tube placement.

PORTABLE ABDOMEN - 1 VIEW

[AP]
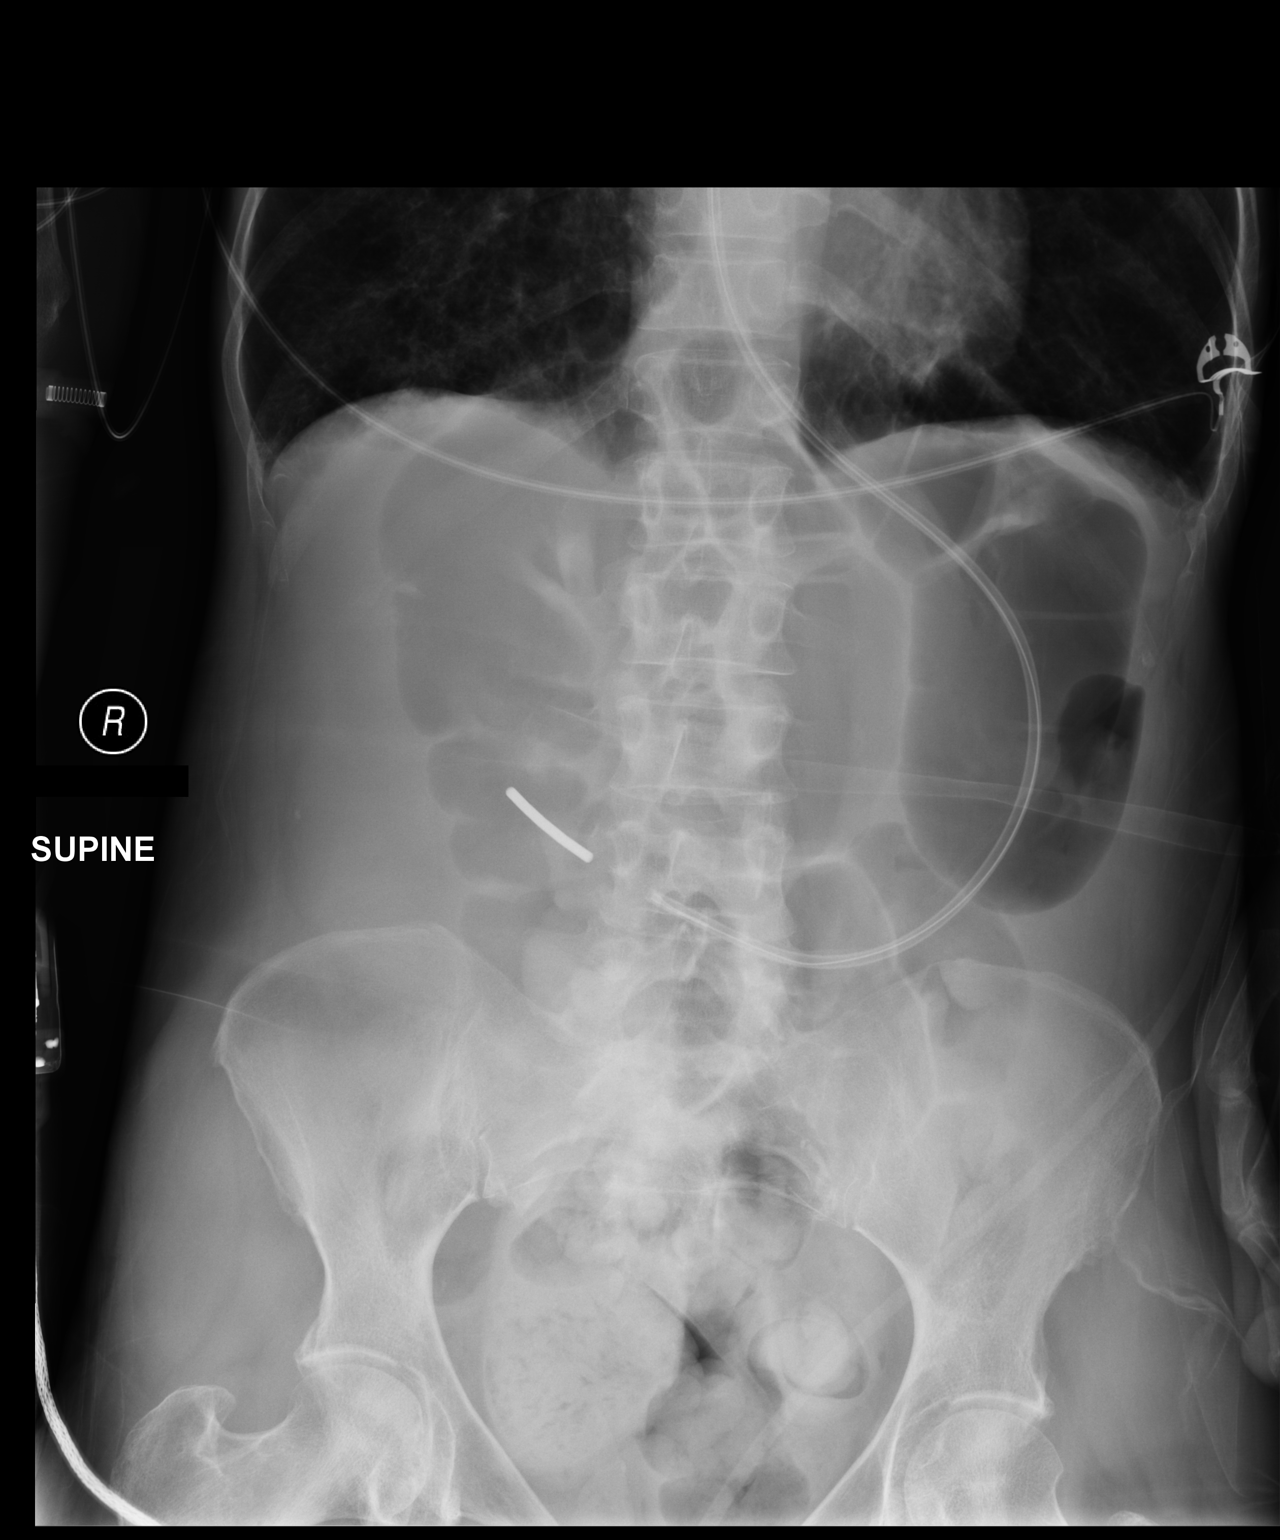

[1 of 1 positions shown; findings below may reference images not displayed]

FINDINGS: Interval advancement of enteric tube.  The tip is now in
the right upper quadrant consistent with location in the distal
stomach.  Examination is otherwise unchanged.
IMPRESSION: Enteric tube tip is in the right upper quadrant consistent with
location in the distal stomach.

## 2015-05-09 ENCOUNTER — Encounter (HOSPITAL_COMMUNITY): Payer: Self-pay | Admitting: Emergency Medicine

## 2015-05-09 ENCOUNTER — Emergency Department (HOSPITAL_COMMUNITY): Payer: Medicare Other

## 2015-05-09 ENCOUNTER — Inpatient Hospital Stay (HOSPITAL_COMMUNITY)
Admission: EM | Admit: 2015-05-09 | Discharge: 2015-05-15 | DRG: 190 | Disposition: A | Discharge status: Skilled Nursing Facility | Payer: Medicare Other | Attending: Internal Medicine | Admitting: Internal Medicine

## 2015-05-09 DIAGNOSIS — R109 Unspecified abdominal pain: Secondary | ICD-10-CM

## 2015-05-09 DIAGNOSIS — R627 Adult failure to thrive: Secondary | ICD-10-CM | POA: Diagnosis present

## 2015-05-09 DIAGNOSIS — E43 Unspecified severe protein-calorie malnutrition: Secondary | ICD-10-CM | POA: Diagnosis present

## 2015-05-09 DIAGNOSIS — R64 Cachexia: Secondary | ICD-10-CM | POA: Diagnosis present

## 2015-05-09 DIAGNOSIS — Z8249 Family history of ischemic heart disease and other diseases of the circulatory system: Secondary | ICD-10-CM

## 2015-05-09 DIAGNOSIS — E611 Iron deficiency: Secondary | ICD-10-CM | POA: Diagnosis present

## 2015-05-09 DIAGNOSIS — F17210 Nicotine dependence, cigarettes, uncomplicated: Secondary | ICD-10-CM | POA: Diagnosis present

## 2015-05-09 DIAGNOSIS — R531 Weakness: Secondary | ICD-10-CM | POA: Diagnosis not present

## 2015-05-09 DIAGNOSIS — J9622 Acute and chronic respiratory failure with hypercapnia: Secondary | ICD-10-CM | POA: Diagnosis present

## 2015-05-09 DIAGNOSIS — I951 Orthostatic hypotension: Secondary | ICD-10-CM | POA: Diagnosis present

## 2015-05-09 DIAGNOSIS — Z681 Body mass index (BMI) 19 or less, adult: Secondary | ICD-10-CM

## 2015-05-09 DIAGNOSIS — R634 Abnormal weight loss: Secondary | ICD-10-CM

## 2015-05-09 DIAGNOSIS — N39498 Other specified urinary incontinence: Secondary | ICD-10-CM | POA: Diagnosis present

## 2015-05-09 DIAGNOSIS — J440 Chronic obstructive pulmonary disease with acute lower respiratory infection: Secondary | ICD-10-CM | POA: Diagnosis not present

## 2015-05-09 DIAGNOSIS — J189 Pneumonia, unspecified organism: Secondary | ICD-10-CM | POA: Diagnosis present

## 2015-05-09 DIAGNOSIS — M069 Rheumatoid arthritis, unspecified: Secondary | ICD-10-CM | POA: Diagnosis present

## 2015-05-09 DIAGNOSIS — E876 Hypokalemia: Secondary | ICD-10-CM | POA: Diagnosis present

## 2015-05-09 DIAGNOSIS — N179 Acute kidney failure, unspecified: Secondary | ICD-10-CM | POA: Diagnosis present

## 2015-05-09 DIAGNOSIS — I10 Essential (primary) hypertension: Secondary | ICD-10-CM | POA: Diagnosis present

## 2015-05-09 DIAGNOSIS — I2510 Atherosclerotic heart disease of native coronary artery without angina pectoris: Secondary | ICD-10-CM | POA: Diagnosis present

## 2015-05-09 DIAGNOSIS — J9621 Acute and chronic respiratory failure with hypoxia: Secondary | ICD-10-CM | POA: Diagnosis present

## 2015-05-09 DIAGNOSIS — E871 Hypo-osmolality and hyponatremia: Secondary | ICD-10-CM | POA: Diagnosis present

## 2015-05-09 DIAGNOSIS — Z833 Family history of diabetes mellitus: Secondary | ICD-10-CM

## 2015-05-09 DIAGNOSIS — J180 Bronchopneumonia, unspecified organism: Secondary | ICD-10-CM | POA: Diagnosis present

## 2015-05-09 DIAGNOSIS — J439 Emphysema, unspecified: Secondary | ICD-10-CM | POA: Diagnosis present

## 2015-05-09 DIAGNOSIS — N390 Urinary tract infection, site not specified: Secondary | ICD-10-CM | POA: Diagnosis present

## 2015-05-09 DIAGNOSIS — E860 Dehydration: Secondary | ICD-10-CM | POA: Diagnosis present

## 2015-05-09 DIAGNOSIS — I252 Old myocardial infarction: Secondary | ICD-10-CM

## 2015-05-09 LAB — COMPREHENSIVE METABOLIC PANEL
ALBUMIN: 4.4 g/dL (ref 3.5–5.0)
ALT: 14 U/L (ref 14–54)
AST: 36 U/L (ref 15–41)
Alkaline Phosphatase: 69 U/L (ref 38–126)
Anion gap: 17 — ABNORMAL HIGH (ref 5–15)
BUN: 36 mg/dL — AB (ref 6–20)
CHLORIDE: 91 mmol/L — AB (ref 101–111)
CO2: 18 mmol/L — ABNORMAL LOW (ref 22–32)
Calcium: 9.5 mg/dL (ref 8.9–10.3)
Creatinine, Ser: 1.2 mg/dL — ABNORMAL HIGH (ref 0.44–1.00)
GFR calc Af Amer: 57 mL/min — ABNORMAL LOW (ref >60)
GFR calc non Af Amer: 49 mL/min — ABNORMAL LOW (ref >60)
GLUCOSE: 163 mg/dL — AB (ref 65–99)
POTASSIUM: 3.7 mmol/L (ref 3.5–5.1)
Sodium: 126 mmol/L — ABNORMAL LOW (ref 135–145)
Total Bilirubin: 0.7 mg/dL (ref 0.3–1.2)
Total Protein: 10 g/dL — ABNORMAL HIGH (ref 6.5–8.1)

## 2015-05-09 LAB — I-STAT CG4 LACTIC ACID, ED
LACTIC ACID, VENOUS: 2.55 mmol/L — AB (ref 0.5–2.0)
LACTIC ACID, VENOUS: 2.53 mmol/L — AB (ref 0.5–2.0)

## 2015-05-09 MED ORDER — SODIUM CHLORIDE 0.9 % IV BOLUS (SEPSIS)
500.0000 mL | Freq: Once | INTRAVENOUS | Status: AC
  Administered 2015-05-09: 500 mL via INTRAVENOUS

## 2015-05-09 NOTE — ED Notes (Addendum)
Pt c/o loss of appetite and abdominal pain "for months now." Pt c/o generalized weakness progressing over the past two months. Pt sts she has lost over 20 pounds in the last two to three months. Denies chest pain or SOB. Per family, pt has been incontinent x a few weeks. Thermometer unable to read oral temperature. Family also sts pt's urine has a strong foul odor.

## 2015-05-09 NOTE — ED Provider Notes (Signed)
CSN: [id number]     Arrival date & time 05/09/15  1856 History   First MD Initiated Contact with Patient 05/09/15 2235     Chief Complaint  Patient presents with  . Failure To Thrive  . Abdominal Pain    Patient is a 60 y.o. female presenting with weakness. The history is provided by the patient and a relative.  Weakness This is a new problem. The current episode started more than 1 week ago. The problem occurs daily. The problem has been gradually worsening. Associated symptoms include abdominal pain and shortness of breath. Associated symptoms comments: Cough . The symptoms are aggravated by standing. The symptoms are relieved by rest. She has tried nothing for the symptoms.  Patient with h/o COPD, h/o CAD, h/o previous ETOH abuse presents for failure to thrive For over past several months she has had decreased appetite, and abdominal pain She reports she has no energy, and when she stands she feels weak and must sit down No syncope She has had increased cough/sob She also has had some vomiting at times Family checked on her today and insisted she come to ED for evaluation She has also had weight loss She has also had some urinary incontinence at times as she can't get to restroom No recent alcohol use per family  Past Medical History  Diagnosis Date  . Rheumatoid arthritis(714.0)   . Bronchitis   . Myocardial infarction (HCC)   . Pneumonia   . COPD (chronic obstructive pulmonary disease) (HCC)   . Chronic respiratory failure (HCC)   . Hyponatremia   . Alcohol abuse   . Cocaine abuse   . Tobacco abuse    Past Surgical History  Procedure Laterality Date  . Tonsillectomy    . Tubal ligation     Family History  Problem Relation Age of Onset  . Coronary artery disease    . Diabetes type II     Social History  Substance Use Topics  . Smoking status: Current Every Day Smoker -- 1.00 packs/day for 39 years    Types: Cigarettes  . Smokeless tobacco: Never Used  . Alcohol  Use: Yes     Comment: quart of beer per day   OB History    No data available     Review of Systems  Constitutional: Positive for chills, appetite change, fatigue and unexpected weight change.  Respiratory: Positive for cough and shortness of breath.   Gastrointestinal: Positive for vomiting and abdominal pain.  Skin: Negative for rash.       Dry skin   Neurological: Positive for weakness. Negative for syncope.  All other systems reviewed and are negative.     Allergies  Review of patient's allergies indicates no known allergies.  Home Medications   Prior to Admission medications   Medication Sig Start Date End Date Taking? Authorizing Provider  albuterol (PROVENTIL) (5 MG/ML) 0.5% nebulizer solution Take 0.5 mLs (2.5 mg total) by nebulization every 6 (six) hours. Patient not taking: Reported on 05/09/2015 04/25/12   Simonne Martinet, NP  antiseptic oral rinse (BIOTENE) LIQD 15 mLs by Mouth Rinse route QID. Patient not taking: Reported on 05/09/2015 04/25/12   Simonne Martinet, NP  bisacodyl (DULCOLAX) 10 MG suppository Place 1 suppository (10 mg total) rectally daily as needed. Patient not taking: Reported on 05/09/2015 04/25/12   Simonne Martinet, NP  budesonide (PULMICORT) 0.5 MG/2ML nebulizer solution Take 2 mLs (0.5 mg total) by nebulization 2 (two) times daily. Patient not taking:  Reported on 05/09/2015 04/25/12   Simonne Martinet, NP  chlorhexidine (PERIDEX) 0.12 % solution Use as directed 15 mLs in the mouth or throat 2 (two) times daily. Patient not taking: Reported on 05/09/2015 04/25/12   Simonne Martinet, NP  digoxin (LANOXIN) 0.25 MG tablet take 1 tablet by mouth once daily Patient not taking: Reported on 05/09/2015 07/28/12   Tiffany L Reed, DO  docusate (COLACE) 50 MG/5ML liquid Place 10 mLs (100 mg total) into feeding tube 2 (two) times daily. Patient not taking: Reported on 05/09/2015 04/25/12   Simonne Martinet, NP  hydrALAZINE (APRESOLINE) 20 MG/ML injection Inject 0.5-2 mLs  (10-40 mg total) into the vein every 4 (four) hours as needed (for SBP >160). Patient not taking: Reported on 05/09/2015 04/25/12   Simonne Martinet, NP  ipratropium (ATROVENT) 0.02 % nebulizer solution Take 2.5 mLs (0.5 mg total) by nebulization every 6 (six) hours. Patient not taking: Reported on 05/09/2015 04/25/12   Simonne Martinet, NP  LORazepam (ATIVAN) 2 MG/ML injection Inject 0.25-0.5 mLs (0.5-1 mg total) into the vein every 4 (four) hours as needed for anxiety (prn anxiety). Patient not taking: Reported on 05/09/2015 04/25/12   Simonne Martinet, NP  metoprolol (LOPRESSOR) 1 MG/ML injection Inject 2.5-5 mLs (2.5-5 mg total) into the vein every 3 (three) hours as needed (to maintain HR<115). Patient not taking: Reported on 05/09/2015 04/25/12   Simonne Martinet, NP  Multiple Vitamin (MULTIVITAMIN WITH MINERALS) TABS Place 1 tablet into feeding tube daily. Patient not taking: Reported on 05/09/2015 04/25/12   Simonne Martinet, NP  Nutritional Supplements (FEEDING SUPPLEMENT, JEVITY 1.2 CAL,) LIQD Place 1,000 mLs into feeding tube daily. Patient not taking: Reported on 05/09/2015 04/25/12   Simonne Martinet, NP  ranitidine (ZANTAC) 150 MG/10ML syrup Place 10 mLs (150 mg total) into feeding tube daily. Patient not taking: Reported on 05/09/2015 04/25/12   Simonne Martinet, NP   BP 138/109 mmHg  Pulse 104  Temp(Src) 97.6 F (36.4 C) (Oral)  Resp 18  SpO2 96% Physical Exam CONSTITUTIONAL: frail, cachectic, appears older than stated age HEAD: Normocephalic/atraumatic EYES: EOMI ENMT: Mucous membranes dry NECK: supple no meningeal signs SPINE/BACK:entire spine nontender CV: S1/S2 noted LUNGS: coarse BS noted bilaterally, mild tachypnea noted ABDOMEN: soft, mild epigastric tenderness, appears to have hepatosplenomegaly NEURO: Pt is awake/alert/appropriate, moves all extremitiesx4.   EXTREMITIES: pulses normal/equal, full ROM, no wounds to feet SKIN: warm, color normal, no wounds to back PSYCH: no  abnormalities of mood noted, alert and oriented to situation  ED Course  Procedures  Labs Review Labs Reviewed  COMPREHENSIVE METABOLIC PANEL - Abnormal; Notable for the following:    Sodium 126 (*)    Chloride 91 (*)    CO2 18 (*)    Glucose, Bld 163 (*)    BUN 36 (*)    Creatinine, Ser 1.20 (*)    Total Protein 10.0 (*)    GFR calc non Af Amer 49 (*)    GFR calc Af Amer 57 (*)    Anion gap 17 (*)    All other components within normal limits  URINALYSIS, ROUTINE W REFLEX MICROSCOPIC (NOT AT [facility]) - Abnormal; Notable for the following:    APPearance CLOUDY (*)    Hgb urine dipstick MODERATE (*)    Protein, ur 100 (*)    Leukocytes, UA SMALL (*)    All other components within normal limits  CBC WITH DIFFERENTIAL/PLATELET - Abnormal; Notable for the following:  MCV 76.9 (*)    MCHC 36.7 (*)    All other components within normal limits  URINE MICROSCOPIC-ADD ON - Abnormal; Notable for the following:    Squamous Epithelial / LPF 0-5 (*)    Bacteria, UA MANY (*)    Casts GRANULAR CAST (*)    All other components within normal limits  I-STAT CG4 LACTIC ACID, ED - Abnormal; Notable for the following:    Lactic Acid, Venous 2.55 (*)    All other components within normal limits  I-STAT CG4 LACTIC ACID, ED - Abnormal; Notable for the following:    Lactic Acid, Venous 2.53 (*)    All other components within normal limits  URINE CULTURE  CBC WITH DIFFERENTIAL/PLATELET  LIPASE, BLOOD    Imaging Review Dg Chest Portable 1 View  05/10/2015  CLINICAL DATA:  Cough EXAM: PORTABLE CHEST 1 VIEW COMPARISON:  05/31/2012 chest radiograph. FINDINGS: Stable cardiomediastinal silhouette with normal heart size. No pneumothorax. No pleural effusion. Hyperinflated lungs and emphysema. No pulmonary edema or acute consolidative airspace disease. IMPRESSION: Hyperinflated lungs and emphysema, suggesting COPD. Otherwise no acute cardiopulmonary disease. Electronically Signed   By: Delbert Phenix M.D.    On: 05/10/2015 00:08   I have personally reviewed and evaluated these images and lab results as part of my medical decision-making.   EKG Interpretation   Date/Time:  Thursday May 09 2015 22:53:35 EDT Ventricular Rate:  107 PR Interval:  140 QRS Duration: 79 QT Interval:  337 QTC Calculation: 450 R Axis:   -117 Text Interpretation:  Sinus tachycardia Multiform ventricular premature  complexes Biatrial enlargement Right superior axis Consider left  ventricular hypertrophy Lateral infarct, old significant artifact, needs  repeat Confirmed by Bebe Shaggy  MD, Victorious Cosio ([id number]) on 05/09/2015 11:24:10 PM     Medications  cefTRIAXone (ROCEPHIN) 1 g in dextrose 5 % 50 mL IVPB (not administered)  sodium chloride 0.9 % bolus 500 mL (0 mLs Intravenous Stopped 05/10/15 0000)   1:08 AM Pt with failure to thrive for several months, poor appetite, wt loss. She is dehydrated UTI noted Concern for possible occult malignancy She is awake/alert, stable while in the bed, she can move all extremities She coughs frequently during exam Will admit D/w dr Onalee Hua for admission to medical service, she requests OBS status MDM   Final diagnoses:  UTI (lower urinary tract infection)  Dehydration  Hyponatremia  Failure to thrive in adult    Nursing notes including past medical history and social history reviewed and considered in documentation xrays/imaging reviewed by myself and considered during evaluation Labs/vital reviewed myself and considered during evaluation     Zadie Rhine, MD 05/10/15 0110

## 2015-05-09 NOTE — ED Notes (Signed)
Pt made aware of need for urine 

## 2015-05-10 ENCOUNTER — Observation Stay (HOSPITAL_COMMUNITY): Payer: Medicare Other

## 2015-05-10 ENCOUNTER — Encounter (HOSPITAL_COMMUNITY): Payer: Self-pay | Admitting: Radiology

## 2015-05-10 DIAGNOSIS — J180 Bronchopneumonia, unspecified organism: Secondary | ICD-10-CM | POA: Diagnosis present

## 2015-05-10 DIAGNOSIS — J440 Chronic obstructive pulmonary disease with acute lower respiratory infection: Secondary | ICD-10-CM | POA: Diagnosis present

## 2015-05-10 DIAGNOSIS — J9622 Acute and chronic respiratory failure with hypercapnia: Secondary | ICD-10-CM | POA: Diagnosis present

## 2015-05-10 DIAGNOSIS — R531 Weakness: Secondary | ICD-10-CM | POA: Diagnosis present

## 2015-05-10 DIAGNOSIS — E871 Hypo-osmolality and hyponatremia: Secondary | ICD-10-CM | POA: Diagnosis present

## 2015-05-10 DIAGNOSIS — E611 Iron deficiency: Secondary | ICD-10-CM | POA: Diagnosis present

## 2015-05-10 DIAGNOSIS — E860 Dehydration: Secondary | ICD-10-CM | POA: Insufficient documentation

## 2015-05-10 DIAGNOSIS — I252 Old myocardial infarction: Secondary | ICD-10-CM | POA: Diagnosis not present

## 2015-05-10 DIAGNOSIS — J189 Pneumonia, unspecified organism: Secondary | ICD-10-CM | POA: Diagnosis present

## 2015-05-10 DIAGNOSIS — M069 Rheumatoid arthritis, unspecified: Secondary | ICD-10-CM | POA: Diagnosis present

## 2015-05-10 DIAGNOSIS — R0902 Hypoxemia: Secondary | ICD-10-CM | POA: Diagnosis not present

## 2015-05-10 DIAGNOSIS — N179 Acute kidney failure, unspecified: Secondary | ICD-10-CM | POA: Diagnosis present

## 2015-05-10 DIAGNOSIS — J439 Emphysema, unspecified: Secondary | ICD-10-CM | POA: Diagnosis not present

## 2015-05-10 DIAGNOSIS — Z8249 Family history of ischemic heart disease and other diseases of the circulatory system: Secondary | ICD-10-CM | POA: Diagnosis not present

## 2015-05-10 DIAGNOSIS — F17210 Nicotine dependence, cigarettes, uncomplicated: Secondary | ICD-10-CM | POA: Diagnosis present

## 2015-05-10 DIAGNOSIS — R634 Abnormal weight loss: Secondary | ICD-10-CM | POA: Diagnosis not present

## 2015-05-10 DIAGNOSIS — R627 Adult failure to thrive: Secondary | ICD-10-CM | POA: Diagnosis not present

## 2015-05-10 DIAGNOSIS — I951 Orthostatic hypotension: Secondary | ICD-10-CM | POA: Diagnosis present

## 2015-05-10 DIAGNOSIS — N39498 Other specified urinary incontinence: Secondary | ICD-10-CM | POA: Diagnosis present

## 2015-05-10 DIAGNOSIS — E43 Unspecified severe protein-calorie malnutrition: Secondary | ICD-10-CM | POA: Diagnosis present

## 2015-05-10 DIAGNOSIS — Z833 Family history of diabetes mellitus: Secondary | ICD-10-CM | POA: Diagnosis not present

## 2015-05-10 DIAGNOSIS — E876 Hypokalemia: Secondary | ICD-10-CM | POA: Diagnosis present

## 2015-05-10 DIAGNOSIS — I10 Essential (primary) hypertension: Secondary | ICD-10-CM | POA: Diagnosis present

## 2015-05-10 DIAGNOSIS — R109 Unspecified abdominal pain: Secondary | ICD-10-CM | POA: Diagnosis not present

## 2015-05-10 DIAGNOSIS — Z681 Body mass index (BMI) 19 or less, adult: Secondary | ICD-10-CM | POA: Diagnosis not present

## 2015-05-10 DIAGNOSIS — I2510 Atherosclerotic heart disease of native coronary artery without angina pectoris: Secondary | ICD-10-CM | POA: Diagnosis present

## 2015-05-10 DIAGNOSIS — R918 Other nonspecific abnormal finding of lung field: Secondary | ICD-10-CM | POA: Diagnosis not present

## 2015-05-10 DIAGNOSIS — N390 Urinary tract infection, site not specified: Secondary | ICD-10-CM | POA: Diagnosis not present

## 2015-05-10 DIAGNOSIS — R0602 Shortness of breath: Secondary | ICD-10-CM | POA: Diagnosis not present

## 2015-05-10 DIAGNOSIS — R6251 Failure to thrive (child): Secondary | ICD-10-CM | POA: Insufficient documentation

## 2015-05-10 DIAGNOSIS — J9621 Acute and chronic respiratory failure with hypoxia: Secondary | ICD-10-CM | POA: Diagnosis present

## 2015-05-10 DIAGNOSIS — R64 Cachexia: Secondary | ICD-10-CM | POA: Diagnosis present

## 2015-05-10 LAB — BASIC METABOLIC PANEL
Anion gap: 11 (ref 5–15)
BUN: 30 mg/dL — AB (ref 6–20)
CHLORIDE: 94 mmol/L — AB (ref 101–111)
CO2: 25 mmol/L (ref 22–32)
CREATININE: 0.95 mg/dL (ref 0.44–1.00)
Calcium: 8.7 mg/dL — ABNORMAL LOW (ref 8.9–10.3)
GFR calc Af Amer: >60 mL/min (ref >60)
GFR calc non Af Amer: >60 mL/min (ref >60)
GLUCOSE: 127 mg/dL — AB (ref 65–99)
Potassium: 3.6 mmol/L (ref 3.5–5.1)
SODIUM: 130 mmol/L — AB (ref 135–145)

## 2015-05-10 LAB — URINALYSIS, ROUTINE W REFLEX MICROSCOPIC
Bilirubin Urine: NEGATIVE
GLUCOSE, UA: NEGATIVE mg/dL
Ketones, ur: NEGATIVE mg/dL
Nitrite: NEGATIVE
PROTEIN: 100 mg/dL — AB
SPECIFIC GRAVITY, URINE: 1.018 (ref 1.005–1.030)
pH: 6 (ref 5.0–8.0)

## 2015-05-10 LAB — IRON AND TIBC
IRON: 21 ug/dL — AB (ref 28–170)
Saturation Ratios: 9 % — ABNORMAL LOW (ref 10.4–31.8)
TIBC: 231 ug/dL — AB (ref 250–450)
UIBC: 210 ug/dL

## 2015-05-10 LAB — RAPID URINE DRUG SCREEN, HOSP PERFORMED
AMPHETAMINES: NOT DETECTED
BARBITURATES: NOT DETECTED
Benzodiazepines: NOT DETECTED
Cocaine: NOT DETECTED
OPIATES: NOT DETECTED
TETRAHYDROCANNABINOL: NOT DETECTED

## 2015-05-10 LAB — URINE MICROSCOPIC-ADD ON

## 2015-05-10 LAB — CBC WITH DIFFERENTIAL/PLATELET
BASOS PCT: 0 %
Basophils Absolute: 0 10*3/uL (ref 0.0–0.1)
EOS ABS: 0 10*3/uL (ref 0.0–0.7)
Eosinophils Relative: 0 %
HCT: 37.6 % (ref 36.0–46.0)
Hemoglobin: 13.8 g/dL (ref 12.0–15.0)
LYMPHS ABS: 1.8 10*3/uL (ref 0.7–4.0)
Lymphocytes Relative: 34 %
MCH: 28.2 pg (ref 26.0–34.0)
MCHC: 36.7 g/dL — AB (ref 30.0–36.0)
MCV: 76.9 fL — ABNORMAL LOW (ref 78.0–100.0)
MONO ABS: 0.6 10*3/uL (ref 0.1–1.0)
Monocytes Relative: 11 %
NEUTROS ABS: 2.8 10*3/uL (ref 1.7–7.7)
NEUTROS PCT: 55 %
PLATELETS: 172 10*3/uL (ref 150–400)
RBC: 4.89 MIL/uL (ref 3.87–5.11)
RDW: 13.3 % (ref 11.5–15.5)
WBC: 5.2 10*3/uL (ref 4.0–10.5)

## 2015-05-10 LAB — MAGNESIUM: Magnesium: 2.1 mg/dL (ref 1.7–2.4)

## 2015-05-10 LAB — PHOSPHORUS: Phosphorus: 2.8 mg/dL (ref 2.5–4.6)

## 2015-05-10 LAB — LIPASE, BLOOD: LIPASE: 62 U/L — AB (ref 11–51)

## 2015-05-10 LAB — PREALBUMIN: PREALBUMIN: 3.3 mg/dL — AB (ref 18–38)

## 2015-05-10 MED ORDER — CEFTRIAXONE SODIUM 1 G IJ SOLR
1.0000 g | INTRAMUSCULAR | Status: DC
  Administered 2015-05-10 – 2015-05-13 (×4): 1 g via INTRAVENOUS
  Filled 2015-05-10 (×5): qty 10

## 2015-05-10 MED ORDER — SODIUM CHLORIDE 0.9 % IV SOLN
INTRAVENOUS | Status: DC
  Administered 2015-05-10 – 2015-05-13 (×4): via INTRAVENOUS

## 2015-05-10 MED ORDER — IPRATROPIUM-ALBUTEROL 0.5-2.5 (3) MG/3ML IN SOLN
3.0000 mL | Freq: Two times a day (BID) | RESPIRATORY_TRACT | Status: DC
  Administered 2015-05-10: 3 mL via RESPIRATORY_TRACT
  Filled 2015-05-10: qty 3

## 2015-05-10 MED ORDER — GUAIFENESIN ER 600 MG PO TB12
600.0000 mg | ORAL_TABLET | Freq: Two times a day (BID) | ORAL | Status: DC
  Administered 2015-05-10 – 2015-05-15 (×10): 600 mg via ORAL
  Filled 2015-05-10 (×15): qty 1

## 2015-05-10 MED ORDER — ENSURE ENLIVE PO LIQD
237.0000 mL | Freq: Two times a day (BID) | ORAL | Status: DC
  Administered 2015-05-10 – 2015-05-15 (×8): 237 mL via ORAL

## 2015-05-10 MED ORDER — AZITHROMYCIN 500 MG PO TABS
500.0000 mg | ORAL_TABLET | Freq: Every day | ORAL | Status: DC
  Administered 2015-05-10 – 2015-05-14 (×5): 500 mg via ORAL
  Filled 2015-05-10 (×5): qty 1

## 2015-05-10 MED ORDER — ONDANSETRON HCL 4 MG/2ML IJ SOLN
4.0000 mg | Freq: Four times a day (QID) | INTRAMUSCULAR | Status: DC | PRN
Start: 2015-05-10 — End: 2015-05-15

## 2015-05-10 MED ORDER — IOPAMIDOL (ISOVUE-300) INJECTION 61%
100.0000 mL | Freq: Once | INTRAVENOUS | Status: AC | PRN
  Administered 2015-05-10: 80 mL via INTRAVENOUS

## 2015-05-10 MED ORDER — ARFORMOTEROL TARTRATE 15 MCG/2ML IN NEBU
15.0000 ug | INHALATION_SOLUTION | Freq: Two times a day (BID) | RESPIRATORY_TRACT | Status: DC
  Administered 2015-05-10 – 2015-05-15 (×9): 15 ug via RESPIRATORY_TRACT
  Filled 2015-05-10 (×17): qty 2

## 2015-05-10 MED ORDER — HYDROCOD POLST-CPM POLST ER 10-8 MG/5ML PO SUER
5.0000 mL | Freq: Two times a day (BID) | ORAL | Status: DC
  Administered 2015-05-10 – 2015-05-15 (×11): 5 mL via ORAL
  Filled 2015-05-10 (×11): qty 5

## 2015-05-10 MED ORDER — ENOXAPARIN SODIUM 30 MG/0.3ML ~~LOC~~ SOLN
30.0000 mg | SUBCUTANEOUS | Status: DC
  Administered 2015-05-10 – 2015-05-15 (×6): 30 mg via SUBCUTANEOUS
  Filled 2015-05-10 (×7): qty 0.3

## 2015-05-10 MED ORDER — IOHEXOL 300 MG/ML  SOLN
25.0000 mL | INTRAMUSCULAR | Status: AC
  Administered 2015-05-10 (×2): 25 mL via ORAL

## 2015-05-10 MED ORDER — HYDROCODONE-ACETAMINOPHEN 5-325 MG PO TABS
0.5000 | ORAL_TABLET | ORAL | Status: DC | PRN
  Administered 2015-05-12 – 2015-05-14 (×2): 0.5 via ORAL
  Filled 2015-05-10 (×3): qty 1

## 2015-05-10 MED ORDER — ACETAMINOPHEN 325 MG PO TABS
650.0000 mg | ORAL_TABLET | Freq: Four times a day (QID) | ORAL | Status: DC | PRN
  Administered 2015-05-13 – 2015-05-14 (×3): 650 mg via ORAL
  Filled 2015-05-10 (×3): qty 2

## 2015-05-10 MED ORDER — BIOTENE DRY MOUTH MT LIQD
1.0000 "application " | Freq: Four times a day (QID) | OROMUCOSAL | Status: DC
  Administered 2015-05-10 – 2015-05-13 (×9): 15 mL via OROMUCOSAL

## 2015-05-10 MED ORDER — DEXTROSE 5 % IV SOLN
1.0000 g | Freq: Once | INTRAVENOUS | Status: AC
  Administered 2015-05-10: 1 g via INTRAVENOUS
  Filled 2015-05-10: qty 10

## 2015-05-10 MED ORDER — BENZONATATE 100 MG PO CAPS
100.0000 mg | ORAL_CAPSULE | Freq: Three times a day (TID) | ORAL | Status: DC
  Administered 2015-05-10 – 2015-05-15 (×15): 100 mg via ORAL
  Filled 2015-05-10 (×23): qty 1

## 2015-05-10 MED ORDER — BUDESONIDE 0.5 MG/2ML IN SUSP
0.5000 mg | Freq: Two times a day (BID) | RESPIRATORY_TRACT | Status: DC
  Administered 2015-05-10 – 2015-05-15 (×11): 0.5 mg via RESPIRATORY_TRACT
  Filled 2015-05-10 (×11): qty 2

## 2015-05-10 MED ORDER — IPRATROPIUM-ALBUTEROL 0.5-2.5 (3) MG/3ML IN SOLN: 3.0000 mL | Freq: Three times a day (TID) | RESPIRATORY_TRACT | Status: DC

## 2015-05-10 MED ORDER — MORPHINE SULFATE (PF) 2 MG/ML IV SOLN
1.0000 mg | INTRAVENOUS | Status: DC | PRN
  Administered 2015-05-10 – 2015-05-13 (×2): 1 mg via INTRAVENOUS
  Filled 2015-05-10 (×2): qty 1

## 2015-05-10 MED ORDER — ACETAMINOPHEN 650 MG RE SUPP: 650.0000 mg | Freq: Four times a day (QID) | RECTAL | Status: DC | PRN

## 2015-05-10 MED ORDER — ONDANSETRON HCL 4 MG PO TABS
4.0000 mg | ORAL_TABLET | Freq: Four times a day (QID) | ORAL | Status: DC | PRN
  Administered 2015-05-15: 4 mg via ORAL
  Filled 2015-05-10: qty 1

## 2015-05-10 MED ORDER — ALBUTEROL SULFATE (2.5 MG/3ML) 0.083% IN NEBU
2.5000 mg | INHALATION_SOLUTION | RESPIRATORY_TRACT | Status: DC | PRN
  Administered 2015-05-10 – 2015-05-15 (×3): 2.5 mg via RESPIRATORY_TRACT
  Filled 2015-05-10 (×3): qty 3

## 2015-05-10 NOTE — H&P (Addendum)
[facility] History and Physical  Jennifer Hurley TMA:[id number] DOB: 05-20-1955 DOA: 05/09/2015  Referring physician: ED  PCP: Lonia Blood, MD   Chief Complaint: Abdominal pain and loss of appetite  HPI:  Jennifer Hurley is a 60 year old female with a past medical history significant for rheumatoid arthritis, alcohol, COPD/emphysema, hyponatremia, alcohol abuse, tobacco abuse, and history of cocaine abuse; who presents with complaints of abdominal pain and decreased appetite. Patient notes that these symptoms have at least been going on for the last 2-3 months and just progressively worsening. Associated symptoms include nausea, vomiting, dry skin, and generalized weakness. Complains of pain on the left flank and incontinence of urine intermittently. Family noted complaints of foul-smelling urine and they deny any recent alcohol use. Patient notes at least 40 pound weight loss over the last 6 months. Associated symptoms include increased shortness of breath and cough. Patient does not appear to have been taking any of her medicines.  Upon admission patient was evaluated with a chest x-ray which showed hyperinflated lungs and emphysema but no acute disease. Urinalysis was positive for many bacteria, small leukocytes, negative nitrates, and too numerous to count WBCs.   Review of Systems  Constitutional: Positive for weight loss and malaise/fatigue. Negative for fever and chills.       Positive for decreased appetite  HENT: Negative for ear discharge and ear pain.   Eyes: Negative for double vision, photophobia and pain.  Respiratory: Positive for shortness of breath.   Cardiovascular: Negative for palpitations.  Gastrointestinal: Positive for nausea, vomiting and abdominal pain.  Genitourinary: Positive for flank pain.       Positive for foul order and incontinence  Musculoskeletal: Positive for joint pain.  Skin: Negative for itching and rash.       Positive for dry skin  Neurological:  Positive for weakness. Negative for focal weakness and seizures.  Endo/Heme/Allergies: Negative for environmental allergies. Does not bruise/bleed easily.  Psychiatric/Behavioral: Negative for suicidal ideas and substance abuse.     Past Medical History  Diagnosis Date  . Rheumatoid arthritis(714.0)   . Bronchitis   . Myocardial infarction (HCC)   . Pneumonia   . COPD (chronic obstructive pulmonary disease) (HCC)   . Chronic respiratory failure (HCC)   . Hyponatremia   . Alcohol abuse   . Cocaine abuse   . Tobacco abuse      Past Surgical History  Procedure Laterality Date  . Tonsillectomy    . Tubal ligation        Social History:  reports that she has been smoking Cigarettes.  She has a 39 pack-year smoking history. She has never used smokeless tobacco. She reports that she drinks alcohol. She reports that she uses illicit drugs (Cocaine and Marijuana).   No Known Allergies  Family History  Problem Relation Age of Onset  . Coronary artery disease    . Diabetes type II          Prior to Admission medications   Medication Sig Start Date End Date Taking? Authorizing Provider  albuterol (PROVENTIL) (5 MG/ML) 0.5% nebulizer solution Take 0.5 mLs (2.5 mg total) by nebulization every 6 (six) hours. Patient not taking: Reported on 05/09/2015 04/25/12   Simonne Martinet, NP  antiseptic oral rinse (BIOTENE) LIQD 15 mLs by Mouth Rinse route QID. Patient not taking: Reported on 05/09/2015 04/25/12   Simonne Martinet, NP  bisacodyl (DULCOLAX) 10 MG suppository Place 1 suppository (10 mg total) rectally daily as needed. Patient not taking: Reported on 05/09/2015  04/25/12   Simonne Martinet, NP  budesonide (PULMICORT) 0.5 MG/2ML nebulizer solution Take 2 mLs (0.5 mg total) by nebulization 2 (two) times daily. Patient not taking: Reported on 05/09/2015 04/25/12   Simonne Martinet, NP  chlorhexidine (PERIDEX) 0.12 % solution Use as directed 15 mLs in the mouth or throat 2 (two) times  daily. Patient not taking: Reported on 05/09/2015 04/25/12   Simonne Martinet, NP  digoxin (LANOXIN) 0.25 MG tablet take 1 tablet by mouth once daily Patient not taking: Reported on 05/09/2015 07/28/12   Tiffany L Reed, DO  docusate (COLACE) 50 MG/5ML liquid Place 10 mLs (100 mg total) into feeding tube 2 (two) times daily. Patient not taking: Reported on 05/09/2015 04/25/12   Simonne Martinet, NP  hydrALAZINE (APRESOLINE) 20 MG/ML injection Inject 0.5-2 mLs (10-40 mg total) into the vein every 4 (four) hours as needed (for SBP >160). Patient not taking: Reported on 05/09/2015 04/25/12   Simonne Martinet, NP  ipratropium (ATROVENT) 0.02 % nebulizer solution Take 2.5 mLs (0.5 mg total) by nebulization every 6 (six) hours. Patient not taking: Reported on 05/09/2015 04/25/12   Simonne Martinet, NP  LORazepam (ATIVAN) 2 MG/ML injection Inject 0.25-0.5 mLs (0.5-1 mg total) into the vein every 4 (four) hours as needed for anxiety (prn anxiety). Patient not taking: Reported on 05/09/2015 04/25/12   Simonne Martinet, NP  metoprolol (LOPRESSOR) 1 MG/ML injection Inject 2.5-5 mLs (2.5-5 mg total) into the vein every 3 (three) hours as needed (to maintain HR<115). Patient not taking: Reported on 05/09/2015 04/25/12   Simonne Martinet, NP  Multiple Vitamin (MULTIVITAMIN WITH MINERALS) TABS Place 1 tablet into feeding tube daily. Patient not taking: Reported on 05/09/2015 04/25/12   Simonne Martinet, NP  Nutritional Supplements (FEEDING SUPPLEMENT, JEVITY 1.2 CAL,) LIQD Place 1,000 mLs into feeding tube daily. Patient not taking: Reported on 05/09/2015 04/25/12   Simonne Martinet, NP  ranitidine (ZANTAC) 150 MG/10ML syrup Place 10 mLs (150 mg total) into feeding tube daily. Patient not taking: Reported on 05/09/2015 04/25/12   Simonne Martinet, NP     Physical Exam: Filed Vitals:   05/09/15 2350 05/10/15 0100 05/10/15 0115 05/10/15 0145  BP: 161/114 150/134    Pulse: 99   93  Temp:      TempSrc:      Resp: SpO2: 94%   95% 94%     Constitutional: Vital signs reviewed. Patient is cachectic. She appears ill and significantly older than stated age Head: Normocephalic and atraumatic. bitemporal wasting Ear: TM normal bilaterally  Mouth: no erythema or exudates, dry mucous membranes  Eyes: PERRL, EOMI, conjunctivae normal, No scleral icterus.  Neck: Supple, Trachea midline normal ROM, No JVD, mass, thyromegaly, or carotid bruit present.  Cardiovascular: RRR, S1 normal, S2 normal, no MRG, pulses symmetric and intact bilaterally  Pulmonary/Chest: Tachypneic with bilateral rales. Cannot appreciate any significant wheezes or rhonchi. Abdominal: Soft. Moderate tenderness along the right side, decreased bowel sounds. GU: no CVA tenderness Musculoskeletal: No joint deformities, erythema, or stiffness, ROM full and no nontender Ext: no edema and no cyanosis, pulses palpable bilaterally (DP and PT)  Hematology: Not able to appreciate any significant adenopathy  Neurological: A&O x3, Strenght is normal and symmetric bilaterally, cranial nerve II-XII are grossly intact, no focal motor deficit, sensory intact to light touch bilaterally.  Skin: Warm, significantly dry flaky skin. No rash, cyanosis, or clubbing.  Psychiatric: Normal mood and affect. speech and behavior  is normal. Judgment and thought content normal. Cognition and memory are normal.      Data Review   Micro Results No results found for this or any previous visit (from the past 240 hour(s)).  Radiology Reports Dg Chest Portable 1 View  05/10/2015  CLINICAL DATA:  Cough EXAM: PORTABLE CHEST 1 VIEW COMPARISON:  05/31/2012 chest radiograph. FINDINGS: Stable cardiomediastinal silhouette with normal heart size. No pneumothorax. No pleural effusion. Hyperinflated lungs and emphysema. No pulmonary edema or acute consolidative airspace disease. IMPRESSION: Hyperinflated lungs and emphysema, suggesting COPD. Otherwise no acute cardiopulmonary disease.  Electronically Signed   By: Delbert Phenix M.D.   On: 05/10/2015 00:08     CBC  Recent Labs Lab 05/10/15 0048  WBC 5.2  HGB 13.8  HCT 37.6  PLT 172  MCV 76.9*  MCH 28.2  MCHC 36.7*  RDW 13.3  LYMPHSABS 1.8  MONOABS 0.6  EOSABS 0.0  BASOSABS 0.0    Chemistries   Recent Labs Lab 05/09/15 2007  NA 126*  K 3.7  CL 91*  CO2 18*  GLUCOSE 163*  BUN 36*  CREATININE 1.20*  CALCIUM 9.5  AST 36  ALT 14  ALKPHOS 69  BILITOT 0.7   ------------------------------------------------------------------------------------------------------------------ CrCl cannot be calculated (Unknown ideal weight.). ------------------------------------------------------------------------------------------------------------------ No results for input(s): HGBA1C in the last 72 hours. ------------------------------------------------------------------------------------------------------------------ No results for input(s): CHOL, HDL, LDLCALC, TRIG, CHOLHDL, LDLDIRECT in the last 72 hours. ------------------------------------------------------------------------------------------------------------------ No results for input(s): TSH, T4TOTAL, T3FREE, THYROIDAB in the last 72 hours.  Invalid input(s): FREET3 ------------------------------------------------------------------------------------------------------------------ No results for input(s): VITAMINB12, FOLATE, FERRITIN, TIBC, IRON, RETICCTPCT in the last 72 hours.  Coagulation profile No results for input(s): INR, PROTIME in the last 168 hours.  No results for input(s): DDIMER in the last 72 hours.  Cardiac Enzymes No results for input(s): CKMB, TROPONINI, MYOGLOBIN in the last 168 hours.  Invalid input(s): CK ------------------------------------------------------------------------------------------------------------------ Invalid input(s): POCBNP   CBG: No results for input(s): GLUCAP in the last 168 hours.     EKG: Independently  reviewed. Sinus tachycardia  Assessment/Plan Abdominal pain with nausea vomiting: Acute on chronic. Patient gives a 3 month history of symptoms just progressively worsening with significant loss of 40 pounds over last 6 months. Patient found to be positive for UTI, but suspect the possibility of underlying malignancy. - Admit to a MedSurg bed - Follow-up CT of abdomen with contrast - Hydrocodone/ Morphine prn moderate and severe pain respectively - Zofran prn nausea and vomiting  Urinary tract infection: Acute. UA positive for many bacteria, small leukocytes, negative nitrates, and too numerous to count WBCs. - Follow-up urine culture - Antibiotics of  IV Rocephin  Hyponatremia with Dehydration: Patient initial sodium 126 on admission. She was given 500 mL bolus of normal saline in the ED - IV fluids of normal saline at 75 mL per hour - Follow-up repeat BMP to make sure patient is not correcting sodium levels too quickly  Acute kidney injury: Patient's baseline creatinine  was previously less than 1. Patient with elevated creatinine 1.2 and BUN 36 for a BUN:Cr ratio of greater than 20 on admission.  - Follow-up BMP in a.m.  - Investigate further if creatinine does not improve   COPD/emphysema - DuoNeb scheduled BID and prn - Budesonide & brovana nebs BID - Mucinex   Hypertension - Continue to monitor for now - will need to verify if and what medications patient was taking  Failure to thrive - Will likely need to consult nutrition  - Checking prealbumin -  Ensure in between meals    Code Status:   full Family Communication: bedside Disposition Plan: admit   Total time spent 55 minutes.Greater than 50% of this time was spent in counseling, explanation of diagnosis, planning of further management, and coordination of care  Clydie Braun [facility] Pager [phone]  If 7PM-7AM, please contact night-coverage [email or web address] Password [email or web address] 05/10/2015, 2:12 AM

## 2015-05-10 NOTE — Progress Notes (Addendum)
[facility]  60 y/o female admitted by my colleague overnight with COPD, ongoing smoking, Rheumatoid arthritis, h/o cocaine and ETOH abuse for abdominal pain and weight loss.  H/o VDRF in 2014, PEG >> select >> SNF  On my exam she is coughing constantly and tells me she is here for "breathing issues". Cough non-productive. No fevers.   Exam: b/l Rhonchi, no wheeze or crackles, Abdomen soft, NT, ND, CVS regular rate and rhythm, tahcycardic  Principal Problem:    Abdominal pain/ vomiting -no longer complaining of this- CT unrevealing for abdominal pathology  Active Problems:    COPD with emphysema/b/l pneumonia - severe cough- no wheezing - on Rocephin for UTI- add Zithromax - cont Brovana, Pulmicort, PRN Albuterol - Tussionex and Tessalon for cough    UTI (lower urinary tract infection) - on Rocephin, f/u culture  AKI, hyponatremia, orthostatic hypotension - follow orthostatic vitals  Weight loss/ severe protein calorie malnutrition - cont supplements- cont to follow appetite and decide on further work up  H/o Cocaine abuse - UDS negative  Iron deficiency - start Iron prior to discharge- not anemic?- follow Hb with hydration  Calvert Cantor, MD

## 2015-05-10 NOTE — Progress Notes (Signed)
Initial Nutrition Assessment  DOCUMENTATION CODES:   Severe malnutrition in context of chronic illness, Underweight  INTERVENTION:   - Continue providing Ensure Enlive po BID, each supplement provides 350 kcals and 20 grams of protein. - Encourage good PO intake of meals and supplements. - Will continue to monitor for nutritional needs.  NUTRITION DIAGNOSIS:   Malnutrition related to chronic illness as evidenced by severe depletion of muscle mass, severe depletion of body fat, energy intake < or equal to 75% for > or equal to 1 month.  GOAL:   Patient will meet greater than or equal to 90% of their needs  MONITOR:   PO intake, Supplement acceptance, Labs, Weight trends, Skin  REASON FOR ASSESSMENT:   Malnutrition Screening Tool    ASSESSMENT:   60 year old female with a PMH significant for rheumatoid arthritis, alcohol, COPD/emphysema, hyponatremia, alcohol abuse, tobacco abuse, and history of cocaine abuse; who presents with complaints of abdominal pain and decreased appetite. Patient notes that these symptoms have at least been going on for the last 2-3 months and just progressively worsening. Associated symptoms include nausea, vomiting, dry skin, and generalized weakness. Complains of pain on the left flank and incontinence of urine intermittently. Family noted complaints of foul-smelling urine and they deny any recent alcohol use. Patient notes at least 40 pound weight loss over the last 6 months.     Met with patient who was coughing so much it was difficult to obtain history.  Patient does report that she has not eaten for two days.  States that she has had a poor appetite for unknown reason for several months.  Patient states that she is currently hungry and ready to eat.  Per RN, awaiting results of abdominal CT and MD visit prior to ordering lunch.  RN is also concerned about her eating ability with her excessive coughing.  Patient denies any difficulties chewing or  swallowing.  Patient appears ill and cachectic.  Nutrition Focused Physical Exam was completed.  Findings include severe muscle depletion, severe fat depletion, and no edema.  Patient confirms that she has lost a lot of weight over the past 6 months, stating that she used to weigh close to 118#.  Patient currently weighs 69# with a BMI of 12.8 kg/(m^2), classifying her as severely underweight.    Patient is not meeting her needs currently or PTA, as she is severely malnourished due to her reduced intake, weight loss, and severe depletion of muscle and fat.  Patient amenable to consuming Ensure Enlive PO BID while in hospital.  Will continue to monitor for needs  Medications reviewed.  Labs reviewed: low sodium (130), glucose (127).  Diet Order:  Diet Heart Room service appropriate?: Yes; Fluid consistency:: Thin  Skin:  Reviewed, no issues  Last BM:  2/27  Height:   Ht Readings from Last 1 Encounters:  05/10/15 5' 2"  (1.575 m)    Weight:   Wt Readings from Last 1 Encounters:  05/10/15 69 lb 14.2 oz (31.7 kg)    Ideal Body Weight:  50 kg  BMI:  Body mass index is 12.78 kg/(m^2).  Estimated Nutritional Needs:   Kcal:  3154-0086  Protein:  55-65 grams  Fluid:  >/= 1.5 L  EDUCATION NEEDS:   No education needs identified at this time  Veronda Prude, Dietetic Intern Pager: [phone]

## 2015-05-11 ENCOUNTER — Inpatient Hospital Stay (HOSPITAL_COMMUNITY): Payer: Medicare Other

## 2015-05-11 DIAGNOSIS — R634 Abnormal weight loss: Secondary | ICD-10-CM | POA: Insufficient documentation

## 2015-05-11 LAB — BASIC METABOLIC PANEL
Anion gap: 10 (ref 5–15)
BUN: 14 mg/dL (ref 6–20)
CHLORIDE: 97 mmol/L — AB (ref 101–111)
CO2: 23 mmol/L (ref 22–32)
Calcium: 8 mg/dL — ABNORMAL LOW (ref 8.9–10.3)
Creatinine, Ser: 0.69 mg/dL (ref 0.44–1.00)
GFR calc Af Amer: >60 mL/min (ref >60)
GFR calc non Af Amer: >60 mL/min (ref >60)
GLUCOSE: 85 mg/dL (ref 65–99)
POTASSIUM: 3.1 mmol/L — AB (ref 3.5–5.1)
Sodium: 130 mmol/L — ABNORMAL LOW (ref 135–145)

## 2015-05-11 LAB — CBC
HEMATOCRIT: 28.6 % — AB (ref 36.0–46.0)
Hemoglobin: 10.2 g/dL — ABNORMAL LOW (ref 12.0–15.0)
MCH: 27.6 pg (ref 26.0–34.0)
MCHC: 35.7 g/dL (ref 30.0–36.0)
MCV: 77.5 fL — AB (ref 78.0–100.0)
Platelets: 175 10*3/uL (ref 150–400)
RBC: 3.69 MIL/uL — ABNORMAL LOW (ref 3.87–5.11)
RDW: 13.4 % (ref 11.5–15.5)
WBC: 3.8 10*3/uL — ABNORMAL LOW (ref 4.0–10.5)

## 2015-05-11 NOTE — Progress Notes (Addendum)
[facility] PROGRESS NOTE  [name] FAO:[id number] DOB: 1955-05-08 DOA: 05/09/2015 PCP: Lonia Blood, MD  Brief history of presenting illness as documented by Dr Katrinka Blazing on admission:  Ms. Jennifer Hurley is a 60 year old female with a past medical history significant for rheumatoid arthritis, alcohol, COPD/emphysema, hyponatremia, alcohol abuse, tobacco abuse, and history of cocaine abuse; who presents with complaints of abdominal pain and decreased appetite. Patient notes that these symptoms have at least been going on for the last 2-3 months and just progressively worsening. Associated symptoms include nausea, vomiting, dry skin, and generalized weakness. Complains of pain on the left flank and incontinence of urine intermittently. Family noted complaints of foul-smelling urine and they deny any recent alcohol use. Patient notes at least 40 pound weight loss over the last 6 months. Associated symptoms include increased shortness of breath and cough. Patient does not appear to have been taking any of her medicines.  H/o VDRF in 2014, PEG >> select >> SNF  Upon admission patient was evaluated with a chest x-ray which showed hyperinflated lungs and emphysema but no acute disease. Urinalysis was positive for many bacteria, small leukocytes, negative nitrates, and too numerous to count WBCs.   Assessment/Plan:   Principal Problem:    Abdominal pain/ vomiting -no longer complaining of this- CT unrevealing for abdominal pathology on 3-31  Active Problems:    COPD with emphysema/b/l pneumonia: Atypical pneumonia versus bronchopneumonia as seen on CT scan of the abdomen and pelvis at the time of admission - severe cough- no wheezing - on Rocephin for UTI- now also on Zithromax since 3-31.  - cont Brovana, Pulmicort, PRN Albuterol - Tussionex and Tessalon for cough    UTI (lower urinary tract infection) - on Rocephin, f/u culture, pending urine culture this am.   AKI, hyponatremia,  orthostatic hypotension - improved, change IVF to 75 cc/hour,consider D/C by 05-12-15  Weight loss/ severe protein calorie malnutrition/ smoker,   CT chest in 2014 with cavitation Plan: chest CT no contrast today, follow up results to decide whether to pursue pulm consultation.    H/o Cocaine abuse - UDS negative  Iron deficiency - start Iron prior to discharge, medication compliance remains a challenge Hb low? Dilution from IVF,no external bleeding noted. Continue to monitor. Iron panel noted.   Code Status: Full code Family Communication: No family present at the bedside morning. Disposition Plan: Pending further hospital course. Likely home with home health care.   Consultants:  Currently, consider pulmonary consultation based on CT scan of the chest results.  Procedures:  In today  Antibiotics:  Agent is on Rocephin and azithromycin. Rocephin was started for urinary tract infection. Urine culture still pending. His E Theramycin added for patient's pulmonary complaints, history of smoking, COPD.  HPI/Subjective:  Patient is a frail thin cachectic appearing lady sitting up in bed. She is agitated and cleaning that her food is too cold. She states that her breathing is somewhat improved. She still has episodic cough. It is mildly productive.  Objective: Filed Vitals:   05/10/15 2203 05/11/15 0436  BP:    Pulse: 80 78  Temp:  98.2 F (36.8 C)  Resp: 18 18    Intake/Output Summary (Last 24 hours) at 05/11/15 0838 Last data filed at 05/11/15 0448  Gross per 24 hour  Intake    290 ml  Output    100 ml  Net    190 ml   Filed Weights   05/10/15 0200 05/10/15 0212 05/11/15 0436  Weight: 30.5 kg (  67 lb 3.8 oz) 31.7 kg (69 lb 14.2 oz) 33.7 kg (74 lb 4.7 oz)    Exam:   General:  Frail weak  lady sitting up in bed.  Cardiovascular: S1-S2 hyperdynamic precordium  Respiratory: Scattered bilateral rhonchorous breath sounds. No overt wheezes appreciated. Tight.  Diminished towards bases.  Abdomen:  soft nontender  Musculoskeletal: Wasting evident, no edema  Data Reviewed: Basic Metabolic Panel:  Recent Labs Lab 05/09/15 2007 05/10/15 0528 05/11/15 0539  NA 126* 130* 130*  K 3.7 3.6 3.1*  CL 91* 94* 97*  CO2 18* 25 23  GLUCOSE 163* 127* 85  BUN 36* 30* 14  CREATININE 1.20* 0.95 0.69  CALCIUM 9.5 8.7* 8.0*  MG  --  2.1  --   PHOS  --  2.8  --    Liver Function Tests:  Recent Labs Lab 05/09/15 2007  AST 36  ALT 14  ALKPHOS 69  BILITOT 0.7  PROT 10.0*  ALBUMIN 4.4    Recent Labs Lab 05/10/15 0048  LIPASE 62*   No results for input(s): AMMONIA in the last 168 hours. CBC:  Recent Labs Lab 05/10/15 0048 05/11/15 0539  WBC 5.2 3.8*  NEUTROABS 2.8  --   HGB 13.8 10.2*  HCT 37.6 28.6*  MCV 76.9* 77.5*  PLT 172 175   Cardiac Enzymes: No results for input(s): CKTOTAL, CKMB, CKMBINDEX, TROPONINI in the last 168 hours. BNP (last 3 results) No results for input(s): BNP in the last 8760 hours.  ProBNP (last 3 results) No results for input(s): PROBNP in the last 8760 hours.  CBG: No results for input(s): GLUCAP in the last 168 hours.  No results found for this or any previous visit (from the past 240 hour(s)).   Studies: Ct Abdomen Pelvis W Contrast  05/10/2015  CLINICAL DATA:  Decreased appetite a 2 to three-month history of worsening abdominal pain. EXAM: CT ABDOMEN AND PELVIS WITH CONTRAST TECHNIQUE: Multidetector CT imaging of the abdomen and pelvis was performed using the standard protocol following bolus administration of intravenous contrast. CONTRAST:  18mL ISOVUE-300 IOPAMIDOL (ISOVUE-300) INJECTION 61% COMPARISON:  None. FINDINGS: Lower chest: Emphysema with chronic interstitial and bronchial wall thickening. Scattered nodularity in the lung bases, left greater than right causing bronchovascular distribution with more confluent airspace disease in the posterior left costophrenic sulcus. Hepatobiliary: Small  area of low attenuation in the anterior liver, adjacent to the falciform ligament, is in a characteristic location for focal fatty change. No evidence for enhancing mass within the liver parenchyma. Dependent sludge noted in the gallbladder. There is trace prominence of the intrahepatic bile ducts with no overt extrahepatic biliary duct dilatation. Pancreas: Mild prominence of the main pancreatic duct without pancreatic mass lesion. Spleen: No splenomegaly. No focal mass lesion. Adrenals/Urinary Tract: No adrenal nodule or mass. Kidneys are unremarkable. No evidence for hydroureter The urinary bladder appears normal for the degree of distention. Stomach/Bowel: Stomach is nondistended. No gastric wall thickening. No evidence of outlet obstruction. Duodenum is normally positioned as is the ligament of Treitz. No small bowel wall thickening. No small bowel dilatation. The terminal ileum is normal. The appendix is normal. No gross colonic mass. No colonic wall thickening. No substantial diverticular change. Vascular/Lymphatic: There is abdominal aortic atherosclerosis without aneurysm. There is no gastrohepatic or hepatoduodenal ligament lymphadenopathy. No intraperitoneal or retroperitoneal lymphadenopathy. No pelvic sidewall lymphadenopathy. Reproductive: Uterus is unremarkable. There is no adnexal mass. Other: No intraperitoneal free fluid. Musculoskeletal: Bone windows reveal no worrisome lytic or sclerotic osseous lesions. IMPRESSION:  1. No acute findings in the abdomen or pelvis. Specifically, no features to explain the patient's history of decreased appetite with abdominal pain. 2. Bibasilar, left greater than right patchy airspace disease. Atypical infection and/or bronchopneumonia would be a concern. Electronically Signed   By: Kennith Center M.D.   On: 05/10/2015 12:31   Dg Chest Portable 1 View  05/10/2015  CLINICAL DATA:  Cough EXAM: PORTABLE CHEST 1 VIEW COMPARISON:  05/31/2012 chest radiograph. FINDINGS:  Stable cardiomediastinal silhouette with normal heart size. No pneumothorax. No pleural effusion. Hyperinflated lungs and emphysema. No pulmonary edema or acute consolidative airspace disease. IMPRESSION: Hyperinflated lungs and emphysema, suggesting COPD. Otherwise no acute cardiopulmonary disease. Electronically Signed   By: Delbert Phenix M.D.   On: 05/10/2015 00:08    Scheduled Meds: . antiseptic oral rinse  1 application Mouth Rinse QID  . arformoterol  15 mcg Nebulization BID  . azithromycin  500 mg Oral Daily  . benzonatate  100 mg Oral TID  . budesonide  0.5 mg Nebulization BID  . cefTRIAXone (ROCEPHIN)  IV  1 g Intravenous Q24H  . chlorpheniramine-HYDROcodone  5 mL Oral Q12H  . enoxaparin (LOVENOX) injection  30 mg Subcutaneous Q24H  . feeding supplement (ENSURE ENLIVE)  237 mL Oral BID BM  . guaiFENesin  600 mg Oral BID   Continuous Infusions: . sodium chloride 125 mL/hr at 05/11/15 0224    Principal Problem:   Abdominal pain Active Problems:   COPD with emphysema (HCC)   UTI (lower urinary tract infection)   Failure to thrive in adult   Acute kidney injury (HCC)   Dehydration   AKI (acute kidney injury) (HCC)   UTI (urinary tract infection)    Time spent: 35 minutes    Rosalin Hawking  [facility] Pager [phone]. If 7PM-7AM, please contact night-coverage at [email or web address], password [facility] 05/11/2015, 8:38 AM  LOS: 1 day

## 2015-05-12 LAB — CBC WITH DIFFERENTIAL/PLATELET
BASOS ABS: 0 10*3/uL (ref 0.0–0.1)
Basophils Relative: 0 %
EOS ABS: 0 10*3/uL (ref 0.0–0.7)
EOS PCT: 1 %
HCT: 28.5 % — ABNORMAL LOW (ref 36.0–46.0)
Hemoglobin: 10.2 g/dL — ABNORMAL LOW (ref 12.0–15.0)
LYMPHS PCT: 44 %
Lymphs Abs: 1.7 10*3/uL (ref 0.7–4.0)
MCH: 28 pg (ref 26.0–34.0)
MCHC: 35.8 g/dL (ref 30.0–36.0)
MCV: 78.3 fL (ref 78.0–100.0)
Monocytes Absolute: 0.5 10*3/uL (ref 0.1–1.0)
Monocytes Relative: 12 %
NEUTROS PCT: 43 %
Neutro Abs: 1.7 10*3/uL (ref 1.7–7.7)
PLATELETS: 212 10*3/uL (ref 150–400)
RBC: 3.64 MIL/uL — AB (ref 3.87–5.11)
RDW: 13.4 % (ref 11.5–15.5)
WBC: 4 10*3/uL (ref 4.0–10.5)

## 2015-05-12 LAB — URINE CULTURE: Culture: 4000

## 2015-05-12 LAB — COMPREHENSIVE METABOLIC PANEL
ALBUMIN: 2.9 g/dL — AB (ref 3.5–5.0)
ALT: 10 U/L — AB (ref 14–54)
AST: 23 U/L (ref 15–41)
Alkaline Phosphatase: 45 U/L (ref 38–126)
Anion gap: 7 (ref 5–15)
BUN: 6 mg/dL (ref 6–20)
CALCIUM: 8.1 mg/dL — AB (ref 8.9–10.3)
CO2: 25 mmol/L (ref 22–32)
CREATININE: 0.59 mg/dL (ref 0.44–1.00)
Chloride: 100 mmol/L — ABNORMAL LOW (ref 101–111)
GFR calc Af Amer: >60 mL/min (ref >60)
GFR calc non Af Amer: >60 mL/min (ref >60)
GLUCOSE: 109 mg/dL — AB (ref 65–99)
Potassium: 2.9 mmol/L — ABNORMAL LOW (ref 3.5–5.1)
SODIUM: 132 mmol/L — AB (ref 135–145)
Total Bilirubin: 0.3 mg/dL (ref 0.3–1.2)
Total Protein: 6.3 g/dL — ABNORMAL LOW (ref 6.5–8.1)

## 2015-05-12 MED ORDER — HYDROCOD POLST-CPM POLST ER 10-8 MG/5ML PO SUER
5.0000 mL | Freq: Once | ORAL | Status: DC
  Filled 2015-05-12: qty 5

## 2015-05-12 NOTE — Progress Notes (Signed)
Progress Note   Jennifer Hurley JTT:[id number] DOB: [birth year] DOA: 05/09/2015  PCP: Lonia Blood, MD    Brief HPI from previous note:  Jennifer Hurley is a 60 year old female with a past medical history significant for rheumatoid arthritis, alcohol, COPD/emphysema, hyponatremia, alcohol abuse, tobacco abuse, and history of cocaine abuse; who presents with complaints of abdominal pain and decreased appetite. Patient notes that these symptoms have at least been going on for the last 2-3 months and just progressively worsening. Associated symptoms include nausea, vomiting, dry skin, and generalized weakness. Complains of pain on the left flank and incontinence of urine intermittently. Family noted complaints of foul-smelling urine and they deny any recent alcohol use. Patient notes at least 40 pound weight loss over the last 6 months. Associated symptoms include increased shortness of breath and cough. Patient does not appear to have been taking any of her medicines.  H/o VDRF in 2014, PEG >> select >> SNF  Upon admission patient was evaluated with a chest x-ray which showed hyperinflated lungs and emphysema but no acute disease. Urinalysis was positive for many bacteria, small leukocytes, negative nitrates, and too numerous to count WBCs  Subjective: Patient has no complaints, although she complained of dyspnea frequently to the nurse. She has not been out of bed yet today. She is laying in bed breathing comfortably at no acute distress. She continues to have poor appetite and poor oral intake.   Physical Exam: BP 134/79 mmHg  Pulse 78  Temp(Src) 97.6 F (36.4 C) (Oral)  Resp 18  Ht 5\' 2"  (1.575 m)  Wt 35.471 kg (78 lb 3.2 oz)  BMI 14.30 kg/m2  SpO2 100%  GENERAL : cachectic, in NAD. HEAD: normocephalic. NOSE: No nasal discharge. NECK: Neck supple CARDIAC: Normal S1 and S2. No S3, S4 or murmurs. Rhythm is regular. There is no peripheral edema. LUNGS: poor air entry, mild wheezing  at end inspiration, mild rhonchi bilaterally.  ABDOMEN: Positive bowel sounds. Soft, nondistended, nontender. EXTREMITIES: No significant deformity or joint abnormality.  NEUROLOGICAL: The mental examination revealed the patient was oriented to person, place, and time.CN II-XII intact.  SKIN: no new rash PSYCHIATRIC:  The patient was able to demonstrate good judgement and reason, without hallucinations, abnormal affect or abnormal behaviors during the examination. Patient is not suicidal.          Labs on Admission:  Reviewed.   Radiological Exams on Admission: Ct Chest Wo Contrast  05/11/2015  CLINICAL DATA:  60 year old female with shortness of breath, cough and weight loss. EXAM: CT CHEST WITHOUT CONTRAST TECHNIQUE: Multidetector CT imaging of the chest was performed following the standard protocol without IV contrast. COMPARISON:  05/09/2015 and prior radiographs.  04/01/2012 chest CT FINDINGS: Mediastinum/Nodes: Coronary artery and thoracic aortic calcifications are noted. A small amount of pericardial fluid is unchanged. There is no evidence of enlarged lymph nodes or mediastinal mass. Lungs/Pleura: Severe emphysema and biapical pulmonary scarring again identified. A 1.5 x 3.6 cm peripheral focal confluent opacity within the posterior left lower lobe (image 67) may represent atelectasis, infection or malignancy. Tree-in-bud opacities in the posterior left lower lobe compatible with infection. A 4 mm right lower lobe nodule and 4 mm left lower lobe nodule (both on image 45) may be stable but motion artifact on the prior study limits comparison. There is no evidence of pleural effusion or pneumothorax. Upper abdomen: No acute abnormality Musculoskeletal: No acute or suspicious abnormalities IMPRESSION: 1.5 x 3.6 cm focal confluent opacity within the posterior left lower lobe -suspect  atelectasis or infection. Malignancy is considered less likely but CT follow-up in 6-8 weeks recommended. Tree-in-bud  opacities within the posterior lower lobe compatible with infection. 4 mm nodule in both the right lower lobe and left lower lobe with- No follow-up needed if patient is low-risk (and has no known or suspected primary neoplasm). Non-contrast chest CT can be considered in 12 months if patient is high-risk. This recommendation follows the consensus statement: Guidelines for Management of Incidental Pulmonary Nodules Detected on CT Images:From the Fleischner Society 2017; published online before print (10.1148/radiol.7672094709). Emphysema, coronary artery disease and aortic atherosclerosis. Electronically Signed   By: Harmon Pier M.D.   On: 05/11/2015 11:44   Ct Abdomen Pelvis W Contrast  05/10/2015  CLINICAL DATA:  Decreased appetite a 2 to three-month history of worsening abdominal pain. EXAM: CT ABDOMEN AND PELVIS WITH CONTRAST TECHNIQUE: Multidetector CT imaging of the abdomen and pelvis was performed using the standard protocol following bolus administration of intravenous contrast. CONTRAST:  69mL ISOVUE-300 IOPAMIDOL (ISOVUE-300) INJECTION 61% COMPARISON:  None. FINDINGS: Lower chest: Emphysema with chronic interstitial and bronchial wall thickening. Scattered nodularity in the lung bases, left greater than right causing bronchovascular distribution with more confluent airspace disease in the posterior left costophrenic sulcus. Hepatobiliary: Small area of low attenuation in the anterior liver, adjacent to the falciform ligament, is in a characteristic location for focal fatty change. No evidence for enhancing mass within the liver parenchyma. Dependent sludge noted in the gallbladder. There is trace prominence of the intrahepatic bile ducts with no overt extrahepatic biliary duct dilatation. Pancreas: Mild prominence of the main pancreatic duct without pancreatic mass lesion. Spleen: No splenomegaly. No focal mass lesion. Adrenals/Urinary Tract: No adrenal nodule or mass. Kidneys are unremarkable. No  evidence for hydroureter The urinary bladder appears normal for the degree of distention. Stomach/Bowel: Stomach is nondistended. No gastric wall thickening. No evidence of outlet obstruction. Duodenum is normally positioned as is the ligament of Treitz. No small bowel wall thickening. No small bowel dilatation. The terminal ileum is normal. The appendix is normal. No gross colonic mass. No colonic wall thickening. No substantial diverticular change. Vascular/Lymphatic: There is abdominal aortic atherosclerosis without aneurysm. There is no gastrohepatic or hepatoduodenal ligament lymphadenopathy. No intraperitoneal or retroperitoneal lymphadenopathy. No pelvic sidewall lymphadenopathy. Reproductive: Uterus is unremarkable. There is no adnexal mass. Other: No intraperitoneal free fluid. Musculoskeletal: Bone windows reveal no worrisome lytic or sclerotic osseous lesions. IMPRESSION: 1. No acute findings in the abdomen or pelvis. Specifically, no features to explain the patient's history of decreased appetite with abdominal pain. 2. Bibasilar, left greater than right patchy airspace disease. Atypical infection and/or bronchopneumonia would be a concern. Electronically Signed   By: Kennith Center M.D.   On: 05/10/2015 12:31     Assessment/Plan  COPD with emphysema/b/l pneumonia:  Atypical pneumonia versus bronchopneumonia as seen on CT scan of the abdomen and pelvis at the time of admission on Rocephin for UTI- now also on Zithromax since 3-31.  cont Brovana, Pulmicort, PRN Albuterol Tussionex and Tessalon for cough Advanced COPD, may need home oxygen eval   UTI (lower urinary tract infection) on Rocephin, f/u culture, pending urine culture this am.   AKI, hyponatremia, orthostatic hypotension  improved, AKI resolved. Will keep IVF due to poor oral intake.   Weight loss/ severe protein calorie malnutrition/ smoker,  CT chest in 2014 with cavitation CT chest 05/11/15 with likely consolidation in  LLL. 4 mm nodules but patient is high risk for malignancy esp with history  of smoking.  Since patient is high risk for malignancy: weight loss, poor appetite, significant smoking history, will consult pulm.   H/o Cocaine abuse - UDS negative  Iron deficiency - start Iron prior to discharge, medication compliance remains a challenge - Hb stable   Code Status: Full code Family Communication: No family present at the bedside morning. Disposition Plan: Pending further hospital course. Likely home with home health care.    Eston Esters M.D [facility]

## 2015-05-13 DIAGNOSIS — R911 Solitary pulmonary nodule: Secondary | ICD-10-CM

## 2015-05-13 DIAGNOSIS — R627 Adult failure to thrive: Secondary | ICD-10-CM

## 2015-05-13 DIAGNOSIS — R109 Unspecified abdominal pain: Secondary | ICD-10-CM

## 2015-05-13 DIAGNOSIS — R0902 Hypoxemia: Secondary | ICD-10-CM

## 2015-05-13 DIAGNOSIS — R0602 Shortness of breath: Secondary | ICD-10-CM

## 2015-05-13 DIAGNOSIS — J439 Emphysema, unspecified: Secondary | ICD-10-CM

## 2015-05-13 DIAGNOSIS — N179 Acute kidney failure, unspecified: Secondary | ICD-10-CM

## 2015-05-13 DIAGNOSIS — R918 Other nonspecific abnormal finding of lung field: Secondary | ICD-10-CM

## 2015-05-13 MED ORDER — POTASSIUM CHLORIDE CRYS ER 20 MEQ PO TBCR
40.0000 meq | EXTENDED_RELEASE_TABLET | ORAL | Status: AC
  Administered 2015-05-13 (×3): 40 meq via ORAL
  Filled 2015-05-13 (×3): qty 2

## 2015-05-13 NOTE — Care Management Note (Signed)
Case Management Note  Patient Details  Name: Jennifer Hurley MRN: [id number] Date of Birth: 1955/03/28  Subjective/Objective:                    Action/Plan:d/c plan home.   Expected Discharge Date:                  Expected Discharge Plan:  Home/Self Care  In-House Referral:     Discharge planning Services  CM Consult  Post Acute Care Choice:    Choice offered to:     DME Arranged:    DME Agency:     HH Arranged:    HH Agency:     Status of Service:  In process, will continue to follow  Medicare Important Message Given:    Date Medicare IM Given:    Medicare IM give by:    Date Additional Medicare IM Given:    Additional Medicare Important Message give by:     If discussed at Long Length of Stay Meetings, dates discussed:    Additional Comments:  Lanier Clam, RN 05/13/2015, 3:50 PM

## 2015-05-13 NOTE — Progress Notes (Signed)
Progress Note   ANAYSHA ANDRE HBZ:[id number] DOB: April 03, 1955 DOA: 05/09/2015  PCP: Lonia Blood, MD    Brief HPI from previous note:  Ms. Galluzzo is a 60 year old female with a past medical history significant for rheumatoid arthritis, alcohol, COPD/emphysema, hyponatremia, alcohol abuse, tobacco abuse, and history of cocaine abuse; who presents with complaints of abdominal pain and decreased appetite. Patient notes that these symptoms have at least been going on for the last 2-3 months and just progressively worsening. Associated symptoms include nausea, vomiting, dry skin, and generalized weakness. Complains of pain on the left flank and incontinence of urine intermittently. Family noted complaints of foul-smelling urine and they deny any recent alcohol use. Patient notes at least 40 pound weight loss over the last 6 months. Associated symptoms include increased shortness of breath and cough. Patient does not appear to have been taking any of her medicines.  H/o VDRF in 2014, PEG >> select >> SNF  Upon admission patient was evaluated with a chest x-ray which showed hyperinflated lungs and emphysema but no acute disease. Urinalysis was positive for many bacteria, small leukocytes, negative nitrates, and too numerous to count WBCs  Subjective: Thinks breathing has improved-- still on O2  Physical Exam: BP 127/93 mmHg  Pulse 83  Temp(Src) 98.4 F (36.9 C) (Oral)  Resp 20  Ht 5\' 2"  (1.575 m)  Wt 35.471 kg (78 lb 3.2 oz)  BMI 14.30 kg/m2  SpO2 93%  GENERAL : cachectic, in NAD. CARDIAC: Normal S1 and S2. No S3, S4 or murmurs. Rhythm is regular. There is no peripheral edema. LUNGS: appears to be moving more air, no wheezing, mild rhonchi bilaterally.  ABDOMEN: Positive bowel sounds. Soft, nondistended, nontender. EXTREMITIES: No significant deformity or joint abnormality.  .           Radiological Exams on Admission: No results found.   Assessment/Plan  COPD with emphysema/b/l  pneumonia:  Atypical pneumonia versus bronchopneumonia as seen on CT scan of the abdomen and pelvis at the time of admission on Rocephin for UTI- now also on Zithromax since 3-31.  cont Brovana, Pulmicort, PRN Albuterol Tussionex and Tessalon for cough Advanced COPD, may need home oxygen eval  UTI (lower urinary tract infection) -has been on rocephin -no significant growth on culture  AKI, hyponatremia, orthostatic hypotension  improved, AKI resolved  Weight loss/ severe protein calorie malnutrition/ smoker,  CT chest in 2014 with cavitation CT chest 05/11/15 with likely consolidation in LLL. 4 mm nodules but patient is high risk for malignancy esp with history of smoking.  Since patient is high risk for malignancy: weight loss, poor appetite, significant smoking history, will consult pulm.   H/o Cocaine abuse - UDS negative  Iron deficiency - start Iron prior to discharge, medication compliance remains a challenge - Hb stable   Code Status: Full code Family Communication: No family present at the bedside morning. Disposition Plan: Pending further hospital course. Likely home with home health care.   07/11/15 DO [facility]

## 2015-05-13 NOTE — Consult Note (Signed)
Name: Jennifer Hurley MRN: [id number] DOB: [birth year]    ADMISSION DATE:  05/09/2015 CONSULTATION DATE:  05/13/2015  REFERRING MD :  TRH-MD  CHIEF COMPLAINT:  Lung Mass  BRIEF PATIENT DESCRIPTION: 60 year old female smoker with severe emphysema who presents with SOB and wt loss.  Denies hemoptysis but does report being tired all the time, sputum production and cough.  CT was done and that revealed 2 27mm nodules and an opacity at the LLL.  PCCM was consulted for lung mass.  SIGNIFICANT EVENTS  3/30 admission for SOB.  STUDIES:  CT of the chest as above.   HISTORY OF PRESENT ILLNESS:  60 year old female smoker with severe emphysema who presents with SOB and wt loss.  Denies hemoptysis but does report being tired all the time, sputum production and cough.  CT was done and that revealed 2 75mm nodules and an opacity at the LLL.  PCCM was consulted for lung mass.  PAST MEDICAL HISTORY :   has a past medical history of Rheumatoid arthritis(714.0); Bronchitis; Myocardial infarction (HCC); Pneumonia; COPD (chronic obstructive pulmonary disease) (HCC); Chronic respiratory failure (HCC); Hyponatremia; Alcohol abuse; Cocaine abuse; and Tobacco abuse.  has past surgical history that includes Tonsillectomy and Tubal ligation. Prior to Admission medications   Medication Sig Start Date End Date Taking? Authorizing Provider  albuterol (PROVENTIL) (5 MG/ML) 0.5% nebulizer solution Take 0.5 mLs (2.5 mg total) by nebulization every 6 (six) hours. Patient not taking: Reported on 05/09/2015 04/25/12   Simonne Martinet, NP  antiseptic oral rinse (BIOTENE) LIQD 15 mLs by Mouth Rinse route QID. Patient not taking: Reported on 05/09/2015 04/25/12   Simonne Martinet, NP  bisacodyl (DULCOLAX) 10 MG suppository Place 1 suppository (10 mg total) rectally daily as needed. Patient not taking: Reported on 05/09/2015 04/25/12   Simonne Martinet, NP  budesonide (PULMICORT) 0.5 MG/2ML nebulizer solution Take 2 mLs (0.5 mg total)  by nebulization 2 (two) times daily. Patient not taking: Reported on 05/09/2015 04/25/12   Simonne Martinet, NP  chlorhexidine (PERIDEX) 0.12 % solution Use as directed 15 mLs in the mouth or throat 2 (two) times daily. Patient not taking: Reported on 05/09/2015 04/25/12   Simonne Martinet, NP  digoxin (LANOXIN) 0.25 MG tablet take 1 tablet by mouth once daily Patient not taking: Reported on 05/09/2015 07/28/12   Tiffany L Reed, DO  docusate (COLACE) 50 MG/5ML liquid Place 10 mLs (100 mg total) into feeding tube 2 (two) times daily. Patient not taking: Reported on 05/09/2015 04/25/12   Simonne Martinet, NP  hydrALAZINE (APRESOLINE) 20 MG/ML injection Inject 0.5-2 mLs (10-40 mg total) into the vein every 4 (four) hours as needed (for SBP >160). Patient not taking: Reported on 05/09/2015 04/25/12   Simonne Martinet, NP  ipratropium (ATROVENT) 0.02 % nebulizer solution Take 2.5 mLs (0.5 mg total) by nebulization every 6 (six) hours. Patient not taking: Reported on 05/09/2015 04/25/12   Simonne Martinet, NP  LORazepam (ATIVAN) 2 MG/ML injection Inject 0.25-0.5 mLs (0.5-1 mg total) into the vein every 4 (four) hours as needed for anxiety (prn anxiety). Patient not taking: Reported on 05/09/2015 04/25/12   Simonne Martinet, NP  metoprolol (LOPRESSOR) 1 MG/ML injection Inject 2.5-5 mLs (2.5-5 mg total) into the vein every 3 (three) hours as needed (to maintain HR<115). Patient not taking: Reported on 05/09/2015 04/25/12   Simonne Martinet, NP  Multiple Vitamin (MULTIVITAMIN WITH MINERALS) TABS Place 1 tablet into feeding tube daily.  Patient not taking: Reported on 05/09/2015 04/25/12   Simonne Martinet, NP  Nutritional Supplements (FEEDING SUPPLEMENT, JEVITY 1.2 CAL,) LIQD Place 1,000 mLs into feeding tube daily. Patient not taking: Reported on 05/09/2015 04/25/12   Simonne Martinet, NP  ranitidine (ZANTAC) 150 MG/10ML syrup Place 10 mLs (150 mg total) into feeding tube daily. Patient not taking: Reported on 05/09/2015 04/25/12    Simonne Martinet, NP   No Known Allergies  FAMILY HISTORY:  family history is not on file. SOCIAL HISTORY:  reports that she has been smoking Cigarettes.  She has a 39 pack-year smoking history. She has never used smokeless tobacco. She reports that she drinks alcohol. She reports that she uses illicit drugs (Cocaine and Marijuana).  REVIEW OF SYSTEMS:   Constitutional: Negative for fever, chills, weight loss, malaise/fatigue and diaphoresis.  HENT: Negative for hearing loss, ear pain, nosebleeds, congestion, sore throat, neck pain, tinnitus and ear discharge.   Eyes: Negative for blurred vision, double vision, photophobia, pain, discharge and redness.  Respiratory: Negative for cough, hemoptysis, sputum production, shortness of breath, wheezing and stridor.   Cardiovascular: Negative for chest pain, palpitations, orthopnea, claudication, leg swelling and PND.  Gastrointestinal: Negative for heartburn, nausea, vomiting, abdominal pain, diarrhea, constipation, blood in stool and melena.  Genitourinary: Negative for dysuria, urgency, frequency, hematuria and flank pain.  Musculoskeletal: Negative for myalgias, back pain, joint pain and falls.  Skin: Negative for itching and rash.  Neurological: Negative for dizziness, tingling, tremors, sensory change, speech change, focal weakness, seizures, loss of consciousness, weakness and headaches.  Endo/Heme/Allergies: Negative for environmental allergies and polydipsia. Does not bruise/bleed easily.  SUBJECTIVE:   VITAL SIGNS: Temp:  [98.4 F (36.9 C)-98.7 F (37.1 C)] 98.4 F (36.9 C) (04/03 0455) Pulse Rate:  [82-83] 83 (04/03 0455) Resp:  [20] 20 (04/03 0455) BP: (127-144)/(93) 127/93 mmHg (04/03 0455) SpO2:  [93 %-100 %] 93 % (04/03 1007)  PHYSICAL EXAMINATION: General:  Chronically ill appearing emaciated female, in NAD. Neuro:  Alert and interactive, moving all ext to command. HEENT:  [place]/AT, PERRL, EOM-I and MMM. Cardiovascular:   RRR, Nl S1/S2, -M/R/G. Lungs:  Distant BS diffusely, bibasilar crackles. Abdomen:  Soft, NT, ND and +BS. Musculoskeletal:  -edema and -tenderness. Skin:  Intact.   Recent Labs Lab 05/10/15 0528 05/11/15 0539 05/12/15 0530  NA 130* 130* 132*  K 3.6 3.1* 2.9*  CL 94* 97* 100*  CO2 25 23 25   BUN 30* 14 6  CREATININE 0.95 0.69 0.59  GLUCOSE 127* 85 109*    Recent Labs Lab 05/10/15 0048 05/11/15 0539 05/12/15 0530  HGB 13.8 10.2* 10.2*  HCT 37.6 28.6* 28.5*  WBC 5.2 3.8* 4.0  PLT 172 175 212   No results found.  ASSESSMENT / PLAN:  60 year old female with severe emphysema, quite smoking a month ago, scaring, nodules and masses noted.  SOB.  Hypoxemia.  SOB: due to severe emphysema, surprised took her that long to enter the medical system with such severe disease.  - Rocephin.  - Zithromax.  - Pulmicort.  41.  - PRN albuterol.  ?COPD: very likely given history and findings.  - Treat as above.  - No evidence of an exacerbation per se.  - No steroids for now.  - Will need PFTs and f/u with PCCM as outpatient.  Hypoxemia:  - Titrate O2 for sat of 88-92%.  - Will need an ambulatory desat study prior to discharge to qualify for home O2.  Lung mass:  very concerning for cancer but PNA can not be ruled out. PNA are notorious for producing atypical cells so no bx for now.    - Treat with abx as above.  - F/U chest CT in 6-8 wks, if mass/infiltrate remains then will need transthoracic biopsy, not amenable to bronch given location.  - Need to clarify with patient whether she would want treatment if this is truly cancer, will need to discuss this with family as patient was unable to comprehend our conversation.  PCCM will follow.  Discussed with PCCM-NP.  Alyson Reedy, M.D. [facility] Pulmonary/Critical Care Medicine. Pager: [phone]. After hours pager: [phone].  05/13/2015, 3:47 PM

## 2015-05-14 DIAGNOSIS — R634 Abnormal weight loss: Secondary | ICD-10-CM

## 2015-05-14 MED ORDER — LEVOFLOXACIN 750 MG PO TABS
750.0000 mg | ORAL_TABLET | Freq: Every day | ORAL | Status: DC
  Administered 2015-05-15: 750 mg via ORAL
  Filled 2015-05-14 (×3): qty 1

## 2015-05-14 MED ORDER — PHENOL 1.4 % MT LIQD
1.0000 | OROMUCOSAL | Status: DC | PRN
  Administered 2015-05-14: 1 via OROMUCOSAL
  Filled 2015-05-14: qty 177

## 2015-05-14 NOTE — [place] FL2 (Signed)
[place] MEDICAID FL2 LEVEL OF CARE SCREENING TOOL     IDENTIFICATION  Patient Name: Jennifer Hurley Birthdate: 02-24-1955 Sex: female Admission Date (Current Location): 05/09/2015  [facility] and [place] Number:  Producer, television/film/video and Address:         Provider Number: [id number]  Attending Physician Name and Address:  Joseph Art, DO  Relative Name and Phone Number:       Current Level of Care: Hospital Recommended Level of Care: Skilled Nursing Facility Prior Approval Number:    Date Approved/Denied:   PASRR Number: [id number]  Discharge Plan: SNF    Current Diagnoses: Patient Active Problem List   Diagnosis Date Noted  . Weight loss   . UTI (lower urinary tract infection) 05/10/2015  . Failure to thrive (0-17) 05/10/2015  . Abdominal pain 05/10/2015  . Failure to thrive in adult 05/10/2015  . Acute kidney injury (HCC) 05/10/2015  . UTI (urinary tract infection) 05/10/2015  . Dehydration   . AKI (acute kidney injury) (HCC)   . Anemia of other chronic disease 06/29/2012  . HCAP (healthcare-associated pneumonia) 05/02/2012  . Tracheostomy status (HCC) 04/26/2012  . PSVT (paroxysmal supraventricular tachycardia) (HCC) 04/18/2012  . Agitation 04/18/2012  . Delirium 04/18/2012  . Acute respiratory failure (HCC) 04/13/2012  . COPD exacerbation (HCC) 04/13/2012  . Severe protein-calorie malnutrition (HCC) 04/12/2012  . Tobacco abuse   . Cocaine abuse   . Alcohol abuse   . Persistent pneumonia 02/15/2012  . Chronic respiratory failure with hypoxia (HCC) 02/15/2012  . Trichomoniasis 02/13/2012  . Chloride, decreased level 02/12/2012  . Malnutrition (HCC) 02/12/2012  . Acute respiratory failure with hypoxia (HCC) 10/29/2011  . Leukocytosis 10/28/2011  . Hyponatremia 10/28/2011  . Hypokalemia 10/28/2011  . Systolic CHF, chronic (HCC) 10/28/2011  . CAP (community acquired pneumonia) 10/28/2011  . SUBSTANCE ABUSE, MULTIPLE 01/19/2008  . HYPERTENSION  06/29/2007  . CANDIDIASIS, MOUTH (THRUSH) 07/26/2006  . COPD with emphysema (HCC) 07/26/2006    Orientation RESPIRATION BLADDER Height & Weight     Self, Time, Situation, Place  O2 (3 L/min) Incontinent (At times) Weight: 78 lb 3.2 oz (35.471 kg) Height:  5\' 2"  (157.5 cm)  BEHAVIORAL SYMPTOMS/MOOD NEUROLOGICAL BOWEL NUTRITION STATUS   (None)  (None) Continent Diet (Diet regular Room service appropriate; Fluid consistency: Thin )  AMBULATORY STATUS COMMUNICATION OF NEEDS Skin   Limited Assist Verbally Normal                       Personal Care Assistance Level of Assistance  Bathing, Feeding, Dressing Bathing Assistance: Limited assistance Feeding assistance: Independent Dressing Assistance: Limited assistance     Functional Limitations Info  Sight, Hearing, Speech Sight Info: Adequate Hearing Info: Adequate Speech Info: Adequate    SPECIAL CARE FACTORS FREQUENCY  PT (By licensed PT), OT (By licensed OT)     PT Frequency: 5 x wek OT Frequency: 5 x week            Contractures Contractures Info: Not present    Additional Factors Info  Code Status, Allergies Code Status Info: FULL code Allergies Info: No Known Allergies           Current Medications (05/14/2015):  This is the current hospital active medication list Current Facility-Administered Medications  Medication Dose Route Frequency Provider Last Rate Last Dose  . acetaminophen (TYLENOL) tablet 650 mg  650 mg Oral Q6H PRN 07/14/2015, MD   650 mg at 05/13/15 1958   Or  .  acetaminophen (TYLENOL) suppository 650 mg  650 mg Rectal Q6H PRN Clydie Braun, MD      . albuterol (PROVENTIL) (2.5 MG/3ML) 0.083% nebulizer solution 2.5 mg  2.5 mg Nebulization Q4H PRN Clydie Braun, MD   2.5 mg at 05/11/15 1437  . antiseptic oral rinse (BIOTENE) solution 15 mL  1 application Mouth Rinse QID Rondell A Katrinka Blazing, MD   15 mL at 05/13/15 0400  . arformoterol (BROVANA) nebulizer solution 15 mcg  15 mcg Nebulization  BID Clydie Braun, MD   15 mcg at 05/14/15 1101  . azithromycin (ZITHROMAX) tablet 500 mg  500 mg Oral Daily Calvert Cantor, MD   500 mg at 05/14/15 1036  . benzonatate (TESSALON) capsule 100 mg  100 mg Oral TID Calvert Cantor, MD   100 mg at 05/14/15 1036  . budesonide (PULMICORT) nebulizer solution 0.5 mg  0.5 mg Nebulization BID Clydie Braun, MD   0.5 mg at 05/14/15 1100  . cefTRIAXone (ROCEPHIN) 1 g in dextrose 5 % 50 mL IVPB  1 g Intravenous Q24H Clydie Braun, MD   1 g at 05/13/15 2114  . chlorpheniramine-HYDROcodone (TUSSIONEX) 10-8 MG/5ML suspension 5 mL  5 mL Oral Q12H Saima Rizwan, MD   5 mL at 05/14/15 1036  . chlorpheniramine-HYDROcodone (TUSSIONEX) 10-8 MG/5ML suspension 5 mL  5 mL Oral Once Leanne Chang, NP   5 mL at 05/12/15 0415  . enoxaparin (LOVENOX) injection 30 mg  30 mg Subcutaneous Q24H Rondell A Katrinka Blazing, MD   30 mg at 05/14/15 1037  . feeding supplement (ENSURE ENLIVE) (ENSURE ENLIVE) liquid 237 mL  237 mL Oral BID BM Rondell Burtis Junes, MD   237 mL at 05/14/15 1042  . guaiFENesin (MUCINEX) 12 hr tablet 600 mg  600 mg Oral BID Clydie Braun, MD   600 mg at 05/14/15 1036  . HYDROcodone-acetaminophen (NORCO/VICODIN) 5-325 MG per tablet 0.5 tablet  0.5 tablet Oral Q4H PRN Clydie Braun, MD   0.5 tablet at 05/12/15 0542  . morphine 2 MG/ML injection 1 mg  1 mg Intravenous Q2H PRN Clydie Braun, MD   1 mg at 05/13/15 2114  . ondansetron (ZOFRAN) tablet 4 mg  4 mg Oral Q6H PRN Clydie Braun, MD       Or  . ondansetron (ZOFRAN) injection 4 mg  4 mg Intravenous Q6H PRN Rondell A Katrinka Blazing, MD      . phenol (CHLORASEPTIC) mouth spray 1 spray  1 spray Mouth/Throat PRN Leanne Chang, NP   1 spray at 05/14/15 5732     Discharge Medications: Please see discharge summary for a list of discharge medications.  Relevant Imaging Results:  Relevant Lab Results:   Additional Information SSNL [id number]  Scharlene Gloss, Student-SW [phone]

## 2015-05-14 NOTE — Progress Notes (Signed)
Progress Note   Jennifer Hurley YPP:[id number] DOB: 28-May-1955 DOA: 05/09/2015  PCP: Jennifer Blood, MD    Brief HPI from previous note:  [name] is a 60 year old female with a past medical history significant for rheumatoid arthritis, alcohol, COPD/emphysema, hyponatremia, alcohol abuse, tobacco abuse, and history of cocaine abuse; who presents with complaints of abdominal pain and decreased appetite. Patient notes that these symptoms have at least been going on for the last 2-3 months and just progressively worsening. Patient notes at least 40 pound weight loss over the last 6 months. Lung mass was found and is very concerning for cancer but PNA can not be ruled out. Treat PNA and get a F/U chest CT in 6-8 wks, if mass/infiltrate remains then will need transthoracic biopsy, not amenable to bronch given location.  Subjective: Does not think she will need O2  Physical Exam: BP 131/86 mmHg  Pulse 78  Temp(Src) 98.3 F (36.8 C) (Oral)  Resp 18  Ht 5\' 2"  (1.575 m)  Wt 35.471 kg (78 lb 3.2 oz)  BMI 14.30 kg/m2  SpO2 98%  GENERAL : cachectic, in NAD. CARDIAC: Normal S1 and S2. No S3, S4 or murmurs. Rhythm is regular. There is no peripheral edema. LUNGS: appears to be moving more air, no wheezing, mild rhonchi bilaterally.  ABDOMEN: Positive bowel sounds. Soft, nondistended, nontender. EXTREMITIES: No significant deformity or joint abnormality.  .           Radiological Exams on Admission: No results found.   Assessment/Plan  COPD with emphysema/b/l pneumonia:  Atypical pneumonia versus bronchopneumonia as seen on CT scan of the abdomen and pelvis at the time of admission on Rocephin for UTI- now also on Zithromax since 3-31.  cont Brovana, Pulmicort, PRN Albuterol Tussionex and Tessalon for cough Advanced COPD, may need 24/7 oxygen   UTI (lower urinary tract infection) -has been on rocephin -no significant growth on culture  AKI, hyponatremia, orthostatic hypotension  improved, AKI resolved  Weight loss/ severe protein calorie malnutrition/ smoker,  CT chest in 2014 with cavitation CT chest 05/11/15 with likely consolidation in LLL. 4 mm nodules but patient is high risk for malignancy esp with history of smoking.  Since patient is high risk for malignancy: weight loss, poor appetite, significant smoking history, will consult pulm: Plan for repeat CT chest w/o contrast in 4-6 weeks   H/o Cocaine abuse - UDS negative  Iron deficiency - start Iron prior to discharge, medication compliance remains a challenge - Hb stable   Code Status: Full code Family Communication: No family present at the bedside morning. Disposition Plan: SNF   07/11/15 DO [facility]

## 2015-05-14 NOTE — Care Management Important Message (Signed)
Important Message  Patient Details  Name: Jennifer Hurley MRN: [id number] Date of Birth: July 30, 1955   Medicare Important Message Given:  Yes    Haskell Flirt 05/14/2015, 10:31 AMImportant Message  Patient Details  Name: Jennifer Hurley MRN: [id number] Date of Birth: [birth year]   Medicare Important Message Given:  Yes    Haskell Flirt 05/14/2015, 10:31 AM

## 2015-05-14 NOTE — Progress Notes (Signed)
CSW continuing to follow.   BSW Intern received confirmation from [facility] that they can offer pt a bed.  BSW Intern met with pt at bedside to notify pt. Pt plans to dc to [facility] when medically ready.  Pt states she will notify her family.  CSW to continue to follow and provide disposition needs when appropriate.   Harlon Flor, [place] Intern Clinical Social Work Department  [phone]

## 2015-05-14 NOTE — Progress Notes (Signed)
Patient stated that it was okay to discuss patient care and plan of care with son.

## 2015-05-14 NOTE — Evaluation (Signed)
Physical Therapy Evaluation Patient Details Name: Jennifer Hurley MRN: [id number] DOB: [birth year] Today's Date: 05/14/2015   History of Present Illness  60 yo female admitted with abdominal pain, UTI, N/V, possible lung mass. Hx of RA, COPD, ETOH abuse, cocaine use, MI  Clinical Impression  On eval, pt required Min assist for mobility-walked ~15 feet with RW. Dyspnea 3/4. Pt c/o dizziness-distance limited. REcommend ST rehab at SNF unless pt will have 24/7 care at home.     Follow Up Recommendations SNF;Supervision/Assistance - 24 hour (unless pt can arrange for 24/7 care in home)    Equipment Recommendations  None recommended by PT    Recommendations for Other Services       Precautions / Restrictions Precautions Precautions: Fall Restrictions Weight Bearing Restrictions: No      Mobility  Bed Mobility Overal bed mobility: Needs Assistance Bed Mobility: Supine to Sit;Sit to Supine     Supine to sit: Supervision Sit to supine: Supervision   General bed mobility comments: for safety  Transfers Overall transfer level: Needs assistance   Transfers: Sit to/from Stand;Stand Pivot Transfers Sit to Stand: Min guard Stand pivot transfers: Min guard       General transfer comment: close guard for safety. Stand pivot, bed to bsc.   Ambulation/Gait Ambulation/Gait assistance: Min assist Ambulation Distance (Feet): 15 Feet Assistive device: Rolling walker (2 wheeled) Gait Pattern/deviations: Step-through pattern;Decreased stride length;Trunk flexed     General Gait Details: assist to stabilize. Pt c/o dizziness. limited dizziness.   Stairs            Wheelchair Mobility    Modified Rankin (Stroke Patients Only)       Balance Overall balance assessment: Needs assistance         Standing balance support: During functional activity Standing balance-Leahy Scale: Fair                               Pertinent Vitals/Pain Pain Assessment:  No/denies pain    Home Living Family/patient expects to be discharged to:: Private residence Living Arrangements: Alone   Type of Home: Apartment Home Access: Level entry     Home Layout: One level Home Equipment: Environmental consultant - 2 wheels      Prior Function Level of Independence: Independent               Hand Dominance        Extremity/Trunk Assessment   Upper Extremity Assessment: Generalized weakness           Lower Extremity Assessment: Generalized weakness      Cervical / Trunk Assessment: Kyphotic  Communication   Communication: No difficulties  Cognition Arousal/Alertness: Awake/alert Behavior During Therapy: WFL for tasks assessed/performed Overall Cognitive Status: Within Functional Limits for tasks assessed                      General Comments      Exercises        Assessment/Plan    PT Assessment Patient needs continued PT services  PT Diagnosis Difficulty walking;Generalized weakness   PT Problem List Decreased mobility;Decreased balance;Decreased activity tolerance;Decreased strength;Decreased knowledge of use of DME  PT Treatment Interventions DME instruction;Gait training;Functional mobility training;Therapeutic activities;Patient/family education;Balance training;Therapeutic exercise   PT Goals (Current goals can be found in the Care Plan section) Acute Rehab PT Goals Patient Stated Goal: to get stronger PT Goal Formulation: With patient Time For Goal Achievement: 05/28/15 Potential  to Achieve Goals: Good    Frequency Min 3X/week   Barriers to discharge        Co-evaluation               End of Session Equipment Utilized During Treatment: Oxygen Activity Tolerance: Patient limited by fatigue Patient left: in bed;with call bell/phone within reach;with bed alarm set           Time: 6295-2841 PT Time Calculation (min) (ACUTE ONLY): 9 min   Charges:   PT Evaluation $PT Eval Low Complexity: 1 Procedure      PT G Codes:        Rebeca Alert, MPT Pager: [phone]

## 2015-05-14 NOTE — Clinical Social Work Note (Signed)
Clinical Social Work Assessment  Patient Details  Name: Jennifer Hurley MRN: [id number] Date of Birth: Oct 01, 1955  Date of referral:  05/14/15               Reason for consult:  Facility Placement                Permission sought to share information with:  Chartered certified accountant granted to share information::  Yes, Verbal Permission Granted  Name::        Agency::  [facility] SNF Search  Relationship::     Contact Information:     Housing/Transportation Living arrangements for the past 2 months:  [place] of Information:  Patient Patient Interpreter Needed:  None Criminal Activity/Legal Involvement Pertinent to Current Situation/Hospitalization:  No - Comment as needed Significant Relationships:  Adult Children, Friend Lives with:  Self Do you feel safe going back to the place where you live?  No Need for family participation in patient care:  No (Coment)  Care giving concerns:  Pt admitted from home alone. PT recommending short-term rehab at a SNF.   Social Worker assessment / plan:  CSW received PT recommendation. BSW Intern met with pt at bedside. BSW Intern introduced self and explained role. Pt states she lives at home alone with no assistance. BSW Intern explained PT recommendation. Pt agreeable to [facility] search. Pt first choice is [facility]. Pt states she has been to [facility] in the past and would like to be close to her mother.   BSW Intern to complete FL2 and [facility] search via [employer].   BSW Intern to contact [employer] regarding pt first choice.   BSW Intern to follow-up with bed offers.  CSW continuing to follow.  Employment status:  Unemployed Forensic scientist:  Medicare PT Recommendations:  [place] / Referral to community resources:  [place]  Patient/Family's Response to care:  Pt alert and oriented x4. Pt states she does not think  she can care for herself at home alone right now. Pt agreeable to short-term rehab.  Patient/Family's Understanding of and Emotional Response to Diagnosis, Current Treatment, and Prognosis:  Pt is aware of current condition and reports no further questions at this time.  Emotional Assessment Appearance:  Appears stated age Attitude/Demeanor/Rapport:  Other, Reactive (Appropriate) Affect (typically observed):  Adaptable, Accepting Orientation:  Oriented to Self, Oriented to Place, Oriented to  Time, Oriented to Situation Alcohol / Substance use:  Not Applicable Psych involvement (Current and /or in the community):  No (Comment)  Discharge Needs  Concerns to be addressed:  Care Coordination Readmission within the last 30 days:  No Current discharge risk:  None Barriers to Discharge:  Continued Medical Work up   [employer], Student-SW 05/14/2015, 2:04 PM

## 2015-05-14 NOTE — Clinical Social Work Placement (Signed)
   CLINICAL SOCIAL WORK PLACEMENT  NOTE  Date:  05/14/2015  Patient Details  Name: Jennifer Hurley MRN: [id number] Date of Birth: [birth year]  Clinical Social Work is seeking post-discharge placement for this patient at the Skilled  Nursing Facility level of care (*CSW will initial, date and re-position this form in  chart as items are completed):  Yes   Patient/family provided with [facility] Clinical Social Work Department's list of facilities offering this level of care within the geographic area requested by the patient (or if unable, by the patient's family).  Yes   Patient/family informed of their freedom to choose among providers that offer the needed level of care, that participate in Medicare, Medicaid or managed care program needed by the patient, have an available bed and are willing to accept the patient.  Yes   Patient/family informed of [facility]'s ownership interest in [facility] and [facility], as well as of the fact that they are under no obligation to receive care at these facilities.  PASRR submitted to EDS on       PASRR number received on       Existing PASRR number confirmed on 05/14/15     FL2 transmitted to all facilities in geographic area requested by pt/family on 05/14/15     FL2 transmitted to all facilities within larger geographic area on 05/14/15     Patient informed that his/her managed care company has contracts with or will negotiate with certain facilities, including the following:            Patient/family informed of bed offers received.  Patient chooses bed at       Physician recommends and patient chooses bed at      Patient to be transferred to   on  .  Patient to be transferred to facility by       Patient family notified on   of transfer.  Name of family member notified:        PHYSICIAN       Additional Comment:    _______________________________________________ Scharlene Gloss, Student-SW 05/14/2015, 2:13 PM

## 2015-05-14 NOTE — Progress Notes (Signed)
Name: Jennifer Hurley MRN: [id number] DOB: 09-25-1955    ADMISSION DATE:  05/09/2015 CONSULTATION DATE:  05/13/2015  REFERRING MD :  TRH-MD  CHIEF COMPLAINT:  Lung Mass  BRIEF PATIENT DESCRIPTION:  60 year old female with PMH of COPD/emphysema, former tobacco abuse- quit one month ago, rheumatoid arthritis, alcohol abuse, cocaine abuse, hyponatremia, and  failure to thrive.    Per medical review, her last admission was  04/2012 for acute on chronic respiratory failure secondary to pneumonia which required intubation.  She further required a tracheostomy due to failure to wean.  Her chronic chronic malnutrition was thought to be a contributing factor with her weight at that time at 79 lbs.  She was subsequently sent to SNF with trach and PEG (which were later d/c'd).    Prior to current admission, she reports she lives alone at home and is independent with her ADLs although this is becoming challenging for her.  She pays someone to drive her to get her groceries.  If she goes out, she rides the cart at the store to get around.  She has 3 adult kids and it is questionable how much they help her.  She reports quitting smoking 1 month ago abruptly.  She has been smoking since she was 17 and at her heaviest smoking 1.5 packs per day.  Also reports her last drink was a month ago and would only drink two beers a day.  Denied any passing out episodes with drinking.    She presented on 05/09/15 with complaints of 1 week of abdominal and fatigue.  Additionally she has had decreased appetite, increasing SOB and cough with some associated vomiting, urinary incontinence and trouble getting to the bathroom.  Her family had checked in on her and insisted she go the ED for evaluation.   Denied any fever.  It is questionable if she has been taking any of her home medications.   She was found to have UTI, dehydrated, AKI- now resolved, and chronic malnutrition with failure to thrive . Initial chest xray showed  hyperinflated lungs and emphysema but no acute disease.  CT of abd found no acute abd findings but showed basilar densities / airspace disease.  This was further evaluated with a CT of chest with concern of malignancy with c/o SOB, weight loss, and smoking.  CT showed two 68mm nodules and an opacity at the LLL.  PCCM was consulted for lung mass.    SUBJECTIVE:  She has no complaints.  Currently on 100% on 3L [place] oxygen.  Denies any recent change in weight or change in SOB and cough.  Required assistance getting up to [facility].    VITAL SIGNS: Temp:  [98.3 F (36.8 C)] 98.3 F (36.8 C) (04/04 0450) Pulse Rate:  [70-78] 78 (04/04 0450) Resp:  [18-19] 18 (04/04 0450) BP: (131-151)/(86-103) 131/86 mmHg (04/04 0700) SpO2:  [96 %-100 %] 98 % (04/04 1103)  PHYSICAL EXAMINATION: General:  Chronically ill appearing emaciated female, older than stated age, in NAD. Neuro:  Alert and interactive, moving all ext to command.  Generalized weakness. HEENT:  /AT, PERRL, EOM-I and MMM. Cardiovascular:  RRR, Nl S1/S2, -M/R/G. Lungs:  Distant BS diffusely, faint occasional end expiratory wheeze, barrel chest Abdomen:  Soft, NT, ND and +BS. Musculoskeletal:  -edema and -tenderness. Skin:  Intact. Dry skin / scaling on LE's    Recent Labs Lab 05/10/15 0528 05/11/15 0539 05/12/15 0530  NA 130* 130* 132*  K 3.6 3.1* 2.9*  CL  94* 97* 100*  CO2 25 23 25   BUN 30* 14 6  CREATININE 0.95 0.69 0.59  GLUCOSE 127* 85 109*    Recent Labs Lab 05/10/15 0048 05/11/15 0539 05/12/15 0530  HGB 13.8 10.2* 10.2*  HCT 37.6 28.6* 28.5*  WBC 5.2 3.8* 4.0  PLT 172 175 212   No results found.  SIGNIFICANT EVENTS  3/30 admission for SOB and UTI  STUDIES:  4/01  CT Chest >> two 69mm nodules and an opacity at the LLL.   ASSESSMENT / PLAN:  60 year old female with severe emphysema/COPD, quite smoking a month ago, scaring, nodules and masses noted with progressive SOB and cough.  Acute on chronic hypercarbic  respiratory failure- admitted 3/30 with questionable progressive SOB and cough most likely due to chronic ?untreated severe emphysema, surprising she has maintained at home as long as she has since her last hospitalization .  She has been afebrile, VSS, non-toxic appearing.  (Tree-in-bud opacities within the posterior lower lobe compatible with infection).  - Rocephin.  - Zithromax  - continue Pulmicort  - continue Brovana  - PRN albuterol  - PT consult   - Pulmonary hygiene  Questionable COPD: very likely given history and findings.  - Treat as above  - No evidence of an exacerbation   - No steroids for now  - Will need PFTs and f/u with PCCM as outpatient  Concern for chronic hypoxemia - ECHO noted on 04/13/12 with PA peak 37, ? Pulmonary hypertension  - Titrate O2 for sat of 88-95%  - Pending ambulatory desat study prior to discharge to qualify for home O2  Lung mass: very concerning for cancer given her smoking history.   PNA can not be ruled out. PNA are notorious for producing atypical cells so no bx for now.    - Treat with abx as above.  - F/U chest CT in 6-8 wks, if mass/infiltrate remains then will need transthoracic biopsy, not amenable to bronch given location.  Office appointment set up for July 15, 2015 at 1330 with Dr. July 17, 2015.  Office will set up follow-up Chest CT prior to office visit.  Patient reports her phone may or may not be working; her daughter currently has it.  The number is [phone].     Chronic Malnutrition: This appears to be ongoing issue for patient after speaking with her and looking back at 2014 records.  Her weight on 04/25/12 was 79 lbs.  This visit she at 78 lbbs on 05/12/15.    - IM and nutrition following    PCCM will be available PRN.  Please call back if new needs arise.   07/12/15, NP-C [facility] Pgr: [phone] or if no answer [phone] 05/14/2015, 11:31 AM  PCCM Attending Note: Patient seen & examined with nurse  practitioner. Please refer to her progress note. Patient denies any chest pain or pressure. No dyspnea. No coughing. No fever, chills, or sweats.  BP 131/86 mmHg  Pulse 78  Temp(Src) 98.3 F (36.8 C) (Oral)  Resp 18  Ht 5\' 2"  (1.575 m)  Wt 78 lb 3.2 oz (35.471 kg)  BMI 14.30 kg/m2  SpO2 98% General:  Thin, frail female. No distress. Awake. Integument:  Warm & dry. No rash on exposed skin. Pulmonary:  CTAB with normal effort on [place] oxygen.  CBC Latest Ref Rng 05/12/2015 05/11/2015 05/10/2015  WBC 4.0 - 10.5 K/uL 4.0 3.8(L) 5.2  Hemoglobin 12.0 - 15.0 g/dL 10.2(L) 10.2(L) 13.8  Hematocrit  36.0 - 46.0 % 28.5(L) 28.6(L) 37.6  Platelets 150 - 400 K/uL 212 175 172    A/P:  Personally reviewed CT chest with LLL opacity. Underlying emphysema distorts lung architecture. No infectious symptoms at this time but suspect an indolent process. Could certainly be malignancy. Gave the patient my card and advised her to contact me if she developed any new problems before her follow-up appointment.  1. Lung Opacity:  Continue antibiotic treatment. Plan for repeat CT chest w/o contrast in 4-6 weeks. 2. Follow-up:  Patient has follow-up with me in clinic scheduled.  PCCM will sign off at this time. Please contact me if there are any further questions.  Donna Christen Jamison Neighbor, M.D. [facility] Pager:  [phone] After 3pm or if no response, call [phone] 11:47 AM 05/14/2015

## 2015-05-15 DIAGNOSIS — N390 Urinary tract infection, site not specified: Secondary | ICD-10-CM

## 2015-05-15 LAB — CBC
HEMATOCRIT: 29.4 % — AB (ref 36.0–46.0)
Hemoglobin: 10.2 g/dL — ABNORMAL LOW (ref 12.0–15.0)
MCH: 26.9 pg (ref 26.0–34.0)
MCHC: 34.7 g/dL (ref 30.0–36.0)
MCV: 77.6 fL — ABNORMAL LOW (ref 78.0–100.0)
PLATELETS: 286 10*3/uL (ref 150–400)
RBC: 3.79 MIL/uL — AB (ref 3.87–5.11)
RDW: 13.3 % (ref 11.5–15.5)
WBC: 4.3 10*3/uL (ref 4.0–10.5)

## 2015-05-15 LAB — BASIC METABOLIC PANEL
ANION GAP: 7 (ref 5–15)
BUN: <5 mg/dL — ABNORMAL LOW (ref 6–20)
CALCIUM: 8.2 mg/dL — AB (ref 8.9–10.3)
CO2: 34 mmol/L — AB (ref 22–32)
Chloride: 94 mmol/L — ABNORMAL LOW (ref 101–111)
Creatinine, Ser: 0.52 mg/dL (ref 0.44–1.00)
Glucose, Bld: 97 mg/dL (ref 65–99)
POTASSIUM: 3.2 mmol/L — AB (ref 3.5–5.1)
Sodium: 135 mmol/L (ref 135–145)

## 2015-05-15 MED ORDER — HYDROCODONE-ACETAMINOPHEN 5-325 MG PO TABS: 0.5000 | ORAL_TABLET | ORAL | Status: DC | PRN

## 2015-05-15 MED ORDER — ACETAMINOPHEN 325 MG PO TABS: 650.0000 mg | ORAL_TABLET | Freq: Four times a day (QID) | ORAL | Status: AC | PRN

## 2015-05-15 MED ORDER — POTASSIUM CHLORIDE CRYS ER 20 MEQ PO TBCR
40.0000 meq | EXTENDED_RELEASE_TABLET | Freq: Once | ORAL | Status: AC
  Administered 2015-05-15: 40 meq via ORAL
  Filled 2015-05-15: qty 2

## 2015-05-15 MED ORDER — BENZONATATE 100 MG PO CAPS: 100.0000 mg | ORAL_CAPSULE | Freq: Three times a day (TID) | ORAL | Status: DC

## 2015-05-15 MED ORDER — LEVOFLOXACIN 750 MG PO TABS: 750.0000 mg | ORAL_TABLET | Freq: Every day | ORAL | Status: DC

## 2015-05-15 MED ORDER — GUAIFENESIN ER 600 MG PO TB12: 600.0000 mg | ORAL_TABLET | Freq: Two times a day (BID) | ORAL | Status: DC

## 2015-05-15 MED ORDER — ARFORMOTEROL TARTRATE 15 MCG/2ML IN NEBU: 15.0000 ug | INHALATION_SOLUTION | Freq: Two times a day (BID) | RESPIRATORY_TRACT | Status: DC

## 2015-05-15 NOTE — Plan of Care (Signed)
Problem: Health Behavior/Discharge Planning: Goal: Ability to manage health-related needs will improve Outcome: Not Met (add Reason) Patient declines self-management. Pt will need assistance. Plan is to discharge to skilled nursing.  Problem: Nutrition: Goal: Adequate nutrition will be maintained Outcome: Adequate for Discharge Intake improving.

## 2015-05-15 NOTE — Progress Notes (Signed)
Report called to [facility] at [facility]

## 2015-05-15 NOTE — Clinical Social Work Placement (Signed)
Patient is set to discharge to [facility] SNF today. Patient aware & RN, Lauren spoke with patient's daughter, [name] to make aware as well. Discharge packet given to RN, Lauren. [facility] called for transport.     Lincoln Maxin, LCSW [facility] Clinical Social Worker cell #: [phone]    CLINICAL SOCIAL WORK PLACEMENT  NOTE  Date:  05/15/2015  Patient Details  Name: Jennifer Hurley MRN: [id number] Date of Birth: 1955-02-17  Clinical Social Work is seeking post-discharge placement for this patient at the Skilled  Nursing Facility level of care (*CSW will initial, date and re-position this form in  chart as items are completed):  Yes   Patient/family provided with [facility] Clinical Social Work Department's list of facilities offering this level of care within the geographic area requested by the patient (or if unable, by the patient's family).  Yes   Patient/family informed of their freedom to choose among providers that offer the needed level of care, that participate in Medicare, Medicaid or managed care program needed by the patient, have an available bed and are willing to accept the patient.  Yes   Patient/family informed of [facility]'s ownership interest in [facility] and [facility], as well as of the fact that they are under no obligation to receive care at these facilities.  PASRR submitted to EDS on       PASRR number received on       Existing PASRR number confirmed on 05/14/15     FL2 transmitted to all facilities in geographic area requested by pt/family on 05/14/15     FL2 transmitted to all facilities within larger geographic area on 05/14/15     Patient informed that his/her managed care company has contracts with or will negotiate with certain facilities, including the following:        Yes   Patient/family informed of bed offers received.  Patient chooses bed at [facility]     Physician recommends and patient chooses bed at       Patient to be transferred to [facility] on 05/15/15.  Patient to be transferred to facility by [facility]     Patient family notified on 05/15/15 of transfer.  Name of family member notified:  RN spoke with patient's daughter, [name] via phone     PHYSICIAN       Additional Comment:    _______________________________________________ Arlyss Repress, LCSW 05/15/2015, 10:13 AM

## 2015-05-15 NOTE — Care Management Note (Signed)
Case Management Note  Patient Details  Name: Jennifer Hurley MRN: [id number] Date of Birth: 1955-06-15  Subjective/Objective: 60 y.o. M admitted 05/09/2015 for UTI, COPD.                    Action/Plan: Anticipate discharge to [facility] SNF today.  No further CM needs but will be available should additional discharge needs arise.   Expected Discharge Date:                  Expected Discharge Plan:  Home/Self Care  In-House Referral:     Discharge planning Services  CM Consult  Post Acute Care Choice:    Choice offered to:     DME Arranged:    DME Agency:     HH Arranged:    HH Agency:     Status of Service:  In process, will continue to follow  Medicare Important Message Given:  Yes Date Medicare IM Given:    Medicare IM give by:    Date Additional Medicare IM Given:    Additional Medicare Important Message give by:     If discussed at Long Length of Stay Meetings, dates discussed:    Additional Comments:  Yvone Neu, RN 05/15/2015, 9:59 AM

## 2015-05-15 NOTE — Progress Notes (Signed)
Patient's daughter notified of discharge and informed that some of her belongings had been left by [facility]. She stated that she would pick this up this afternoon.

## 2015-05-15 NOTE — Progress Notes (Signed)
Patient refused norco prior to discharge. She had initially complained of pain and felt pain would prevent her from being transferred to gurney for transport. 1/2 tablet of Norco initially wasted at [employer]. After patient refused medication, 1/2 tablet was wasted with Charge Nurse Onalee Hua. Unable to waste in pyxis as patient had been discharged and removed from list.

## 2015-05-15 NOTE — Discharge Summary (Addendum)
Physician Discharge Summary  Jennifer Hurley HKV:[id number] DOB: [birth year] DOA: 05/09/2015  PCP: Lonia Blood, MD  Admit date: 05/09/2015 Discharge date: 05/15/2015  Time spent: 35 minutes  Recommendations for Outpatient Follow-up:  To SNF levaquin until 4/9 Cbc, bmp 1 week 2-3L O2 wean as tolerated Continue nebs Start Fe when d/c'd from SNF  repeat CT chest w/o contrast in 4-6 weeks    Discharge Diagnoses:  Principal Problem:   Abdominal pain Active Problems:   COPD with emphysema (HCC)   UTI (lower urinary tract infection)   Failure to thrive in adult   Acute kidney injury (HCC)   Dehydration   AKI (acute kidney injury) (HCC)   UTI (urinary tract infection)   Weight loss   Discharge Condition: stable  Diet recommendation: cardiac  Filed Weights   05/10/15 0212 05/11/15 0436 05/12/15 0500  Weight: 31.7 kg (69 lb 14.2 oz) 33.7 kg (74 lb 4.7 oz) 35.471 kg (78 lb 3.2 oz)    History of present illness:  Jennifer Hurley is a 60 year old female with a past medical history significant for rheumatoid arthritis, alcohol, COPD/emphysema, hyponatremia, alcohol abuse, tobacco abuse, and history of cocaine abuse; who presents with complaints of abdominal pain and decreased appetite. Patient notes that these symptoms have at least been going on for the last 2-3 months and just progressively worsening. Associated symptoms include nausea, vomiting, dry skin, and generalized weakness. Complains of pain on the left flank and incontinence of urine intermittently. Family noted complaints of foul-smelling urine and they deny any recent alcohol use. Patient notes at least 40 pound weight loss over the last 6 months. Associated symptoms include increased shortness of breath and cough. Patient does not appear to have been taking any of her medicines.  Upon admission patient was evaluated with a chest x-ray which showed hyperinflated lungs and emphysema but no acute disease. Urinalysis was positive for  many bacteria, small leukocytes, negative nitrates, and too numerous to count WBCs.  Hospital Course:  COPD with emphysema/b/l pneumonia:  Atypical pneumonia versus bronchopneumonia as seen on CT scan of the abdomen and pelvis at the time of admission-- changed to Po levaquin for 8 day course cont Brovana, Pulmicort, PRN Albuterol  Tessalon for cough Advanced COPD, may need 24/7 oxygen - wean at [facility]  UTI (lower urinary tract infection) -has been on rocephin -no significant growth on culture  AKI, hyponatremia, orthostatic hypotension improved, AKI resolved  Weight loss/ severe protein calorie malnutrition/ smoker,  CT chest in 2014 with cavitation CT chest 05/11/15 with likely consolidation in LLL. 4 mm nodules but patient is high risk for malignancy esp with history of smoking. Since patient is high risk for malignancy: weight loss, poor appetite, significant smoking history,  consulted pulm: Plan for repeat CT chest w/o contrast in 4-6 weeks   H/o Cocaine abuse - UDS negative  Iron deficiency - start Iron prior to discharge, medication compliance remains a challenge - Hb stable  Hypokalemia -repleted PO  Procedures:    Consultations:  pulm  Discharge Exam: Filed Vitals:   05/14/15 1558 05/15/15 0513  BP: 147/93 160/94  Pulse: 74 74  Temp:  97.9 F (36.6 C)  Resp: 16 16    General: awake, NAD- 3L O2 Cardiovascular: rrr Respiratory: not moving much air- no increase work of breathing  Discharge Instructions   Discharge Instructions    Diet general    Complete by:  As directed      Discharge instructions    Complete by:  As directed   levaquin until 4/9 Cbc, bmp 1 week     Increase activity slowly    Complete by:  As directed           Discharge Medication List as of 05/15/2015  9:53 AM    START taking these medications   Details  acetaminophen (TYLENOL) 325 MG tablet Take 2 tablets (650 mg total) by mouth every 6 (six) hours as needed  for mild pain (or Fever >/= 101)., Starting 05/15/2015, Until Discontinued, No Print    arformoterol (BROVANA) 15 MCG/2ML NEBU Take 2 mLs (15 mcg total) by nebulization 2 (two) times daily., Starting 05/15/2015, Until Discontinued, No Print    benzonatate (TESSALON) 100 MG capsule Take 1 capsule (100 mg total) by mouth 3 (three) times daily., Starting 05/15/2015, Until Discontinued, No Print    guaiFENesin (MUCINEX) 600 MG 12 hr tablet Take 1 tablet (600 mg total) by mouth 2 (two) times daily., Starting 05/15/2015, Until Discontinued, No Print    HYDROcodone-acetaminophen (NORCO/VICODIN) 5-325 MG tablet Take 0.5 tablets by mouth every 4 (four) hours as needed for moderate pain., Starting 05/15/2015, Until Discontinued, Print    levofloxacin (LEVAQUIN) 750 MG tablet Take 1 tablet (750 mg total) by mouth daily before breakfast., Starting 05/15/2015, Until Discontinued, No Print      CONTINUE these medications which have NOT CHANGED   Details  albuterol (PROVENTIL) (5 MG/ML) 0.5% nebulizer solution Take 0.5 mLs (2.5 mg total) by nebulization every 6 (six) hours., Starting 04/25/2012, Until Discontinued, No Print    antiseptic oral rinse (BIOTENE) LIQD 15 mLs by Mouth Rinse route QID., Starting 04/25/2012, Until Discontinued, No Print    bisacodyl (DULCOLAX) 10 MG suppository Place 1 suppository (10 mg total) rectally daily as needed., Starting 04/25/2012, Until Discontinued, No Print    budesonide (PULMICORT) 0.5 MG/2ML nebulizer solution Take 2 mLs (0.5 mg total) by nebulization 2 (two) times daily., Starting 04/25/2012, Until Discontinued, No Print    docusate (COLACE) 50 MG/5ML liquid Place 10 mLs (100 mg total) into feeding tube 2 (two) times daily., Starting 04/25/2012, Until Discontinued, No Print           STOP taking these medications     chlorhexidine (PERIDEX) 0.12 % solution      digoxin (LANOXIN) 0.25 MG tablet      hydrALAZINE (APRESOLINE) 20 MG/ML injection      ipratropium (ATROVENT)  0.02 % nebulizer solution      LORazepam (ATIVAN) 2 MG/ML injection      metoprolol (LOPRESSOR) 1 MG/ML injection      Multiple Vitamin (MULTIVITAMIN WITH MINERALS) TABS      ranitidine (ZANTAC) 150 MG/10ML syrup        No Known Allergies Follow-up Information    Follow up with CT Scan .   Contact information:   [facility] office will call you with an appointment       Follow up with Lawanda Cousins, MD On 07/15/2015.   Specialty:  Pulmonary Disease   Why:  Appointment at 1:30 PM    Contact information:   [address] [place] [address] [phone]       Follow up with [facility].   Specialty:  Skilled Nursing Facility   Contact information:   [address] [address] [phone]      Follow up with Lonia Blood, MD In 1 week.   Specialty:  Internal Medicine   Contact information:   [address]  [place] [address] [phone]        The results of significant diagnostics from this hospitalization (including imaging, microbiology, ancillary and laboratory) are listed below for reference.    Significant Diagnostic Studies: Ct Chest Wo Contrast  05/11/2015  CLINICAL DATA:  60 year old female with shortness of breath, cough and weight loss. EXAM: CT CHEST WITHOUT CONTRAST TECHNIQUE: Multidetector CT imaging of the chest was performed following the standard protocol without IV contrast. COMPARISON:  05/09/2015 and prior radiographs.  04/01/2012 chest CT FINDINGS: Mediastinum/Nodes: Coronary artery and thoracic aortic calcifications are noted. A small amount of pericardial fluid is unchanged. There is no evidence of enlarged lymph nodes or mediastinal mass. Lungs/Pleura: Severe emphysema and biapical pulmonary scarring again identified. A 1.5 x 3.6 cm peripheral focal confluent opacity within the posterior left lower lobe (image 67) may represent atelectasis, infection or malignancy. Tree-in-bud opacities in the  posterior left lower lobe compatible with infection. A 4 mm right lower lobe nodule and 4 mm left lower lobe nodule (both on image 45) may be stable but motion artifact on the prior study limits comparison. There is no evidence of pleural effusion or pneumothorax. Upper abdomen: No acute abnormality Musculoskeletal: No acute or suspicious abnormalities IMPRESSION: 1.5 x 3.6 cm focal confluent opacity within the posterior left lower lobe -suspect atelectasis or infection. Malignancy is considered less likely but CT follow-up in 6-8 weeks recommended. Tree-in-bud opacities within the posterior lower lobe compatible with infection. 4 mm nodule in both the right lower lobe and left lower lobe with- No follow-up needed if patient is low-risk (and has no known or suspected primary neoplasm). Non-contrast chest CT can be considered in 12 months if patient is high-risk. This recommendation follows the consensus statement: Guidelines for Management of Incidental Pulmonary Nodules Detected on CT Images:From the Fleischner Society 2017; published online before print (10.1148/radiol.0932355732). Emphysema, coronary artery disease and aortic atherosclerosis. Electronically Signed   By: Harmon Pier M.D.   On: 05/11/2015 11:44   Ct Abdomen Pelvis W Contrast  05/10/2015  CLINICAL DATA:  Decreased appetite a 2 to three-month history of worsening abdominal pain. EXAM: CT ABDOMEN AND PELVIS WITH CONTRAST TECHNIQUE: Multidetector CT imaging of the abdomen and pelvis was performed using the standard protocol following bolus administration of intravenous contrast. CONTRAST:  6mL ISOVUE-300 IOPAMIDOL (ISOVUE-300) INJECTION 61% COMPARISON:  None. FINDINGS: Lower chest: Emphysema with chronic interstitial and bronchial wall thickening. Scattered nodularity in the lung bases, left greater than right causing bronchovascular distribution with more confluent airspace disease in the posterior left costophrenic sulcus. Hepatobiliary: Small  area of low attenuation in the anterior liver, adjacent to the falciform ligament, is in a characteristic location for focal fatty change. No evidence for enhancing mass within the liver parenchyma. Dependent sludge noted in the gallbladder. There is trace prominence of the intrahepatic bile ducts with no overt extrahepatic biliary duct dilatation. Pancreas: Mild prominence of the main pancreatic duct without pancreatic mass lesion. Spleen: No splenomegaly. No focal mass lesion. Adrenals/Urinary Tract: No adrenal nodule or mass. Kidneys are unremarkable. No evidence for hydroureter The urinary bladder appears normal for the degree of distention. Stomach/Bowel: Stomach is nondistended. No gastric wall thickening. No evidence of outlet obstruction. Duodenum is normally positioned as is the ligament of Treitz. No small bowel wall thickening. No small bowel dilatation. The terminal ileum is normal. The appendix is normal. No gross colonic mass. No colonic wall thickening. No substantial diverticular change. Vascular/Lymphatic: There is abdominal aortic atherosclerosis without aneurysm. There is no  gastrohepatic or hepatoduodenal ligament lymphadenopathy. No intraperitoneal or retroperitoneal lymphadenopathy. No pelvic sidewall lymphadenopathy. Reproductive: Uterus is unremarkable. There is no adnexal mass. Other: No intraperitoneal free fluid. Musculoskeletal: Bone windows reveal no worrisome lytic or sclerotic osseous lesions. IMPRESSION: 1. No acute findings in the abdomen or pelvis. Specifically, no features to explain the patient's history of decreased appetite with abdominal pain. 2. Bibasilar, left greater than right patchy airspace disease. Atypical infection and/or bronchopneumonia would be a concern. Electronically Signed   By: Kennith Center M.D.   On: 05/10/2015 12:31   Dg Chest Portable 1 View  05/10/2015  CLINICAL DATA:  Cough EXAM: PORTABLE CHEST 1 VIEW COMPARISON:  05/31/2012 chest radiograph. FINDINGS:  Stable cardiomediastinal silhouette with normal heart size. No pneumothorax. No pleural effusion. Hyperinflated lungs and emphysema. No pulmonary edema or acute consolidative airspace disease. IMPRESSION: Hyperinflated lungs and emphysema, suggesting COPD. Otherwise no acute cardiopulmonary disease. Electronically Signed   By: Delbert Phenix M.D.   On: 05/10/2015 00:08    Microbiology: Recent Results (from the past 240 hour(s))  Urine culture     Status: None   Collection Time: 05/11/15  4:55 AM  Result Value Ref Range Status   Specimen Description URINE, CLEAN CATCH  Final   Special Requests NONE  Final   Culture   Final    4,000 COLONIES/mL INSIGNIFICANT GROWTH Performed at [facility]    Report Status 05/12/2015 FINAL  Final     Labs: Basic Metabolic Panel:  Recent Labs Lab 05/09/15 2007 05/10/15 0528 05/11/15 0539 05/12/15 0530 05/15/15 0502  NA 126* 130* 130* 132* 135  K 3.7 3.6 3.1* 2.9* 3.2*  CL 91* 94* 97* 100* 94*  CO2 18* 25 23 25  34*  GLUCOSE 163* 127* 85 109* 97  BUN 36* 30* 14 6 <5*  CREATININE 1.20* 0.95 0.69 0.59 0.52  CALCIUM 9.5 8.7* 8.0* 8.1* 8.2*  MG  --  2.1  --   --   --   PHOS  --  2.8  --   --   --    Liver Function Tests:  Recent Labs Lab 05/09/15 2007 05/12/15 0530  AST 36 23  ALT 14 10*  ALKPHOS 69 45  BILITOT 0.7 0.3  PROT 10.0* 6.3*  ALBUMIN 4.4 2.9*    Recent Labs Lab 05/10/15 0048  LIPASE 62*   No results for input(s): AMMONIA in the last 168 hours. CBC:  Recent Labs Lab 05/10/15 0048 05/11/15 0539 05/12/15 0530 05/15/15 0502  WBC 5.2 3.8* 4.0 4.3  NEUTROABS 2.8  --  1.7  --   HGB 13.8 10.2* 10.2* 10.2*  HCT 37.6 28.6* 28.5* 29.4*  MCV 76.9* 77.5* 78.3 77.6*  PLT 172 175 212 286   Cardiac Enzymes: No results for input(s): CKTOTAL, CKMB, CKMBINDEX, TROPONINI in the last 168 hours. BNP: BNP (last 3 results) No results for input(s): BNP in the last 8760 hours.  ProBNP (last 3 results) No results for  input(s): PROBNP in the last 8760 hours.  CBG: No results for input(s): GLUCAP in the last 168 hours.     Signed:  Sreenidhi Ganson U Clotiel Troop DO.  [facility] 05/15/2015, 3:02 PM

## 2015-05-20 ENCOUNTER — Other Ambulatory Visit: Payer: Self-pay | Admitting: Pulmonary Disease

## 2015-05-20 DIAGNOSIS — J189 Pneumonia, unspecified organism: Secondary | ICD-10-CM

## 2015-05-20 DIAGNOSIS — J439 Emphysema, unspecified: Secondary | ICD-10-CM

## 2015-07-11 ENCOUNTER — Inpatient Hospital Stay: Admission: RE | Admit: 2015-07-11 | Payer: Medicare Other | Source: Ambulatory Visit

## 2015-07-15 ENCOUNTER — Inpatient Hospital Stay: Payer: Medicare Other | Admitting: Pulmonary Disease

## 2015-07-15 ENCOUNTER — Inpatient Hospital Stay: Admission: RE | Admit: 2015-07-15 | Payer: Medicare Other | Source: Ambulatory Visit

## 2015-07-15 ENCOUNTER — Telehealth: Payer: Self-pay | Admitting: Pulmonary Disease

## 2015-07-15 NOTE — Telephone Encounter (Signed)
IMAGING CT CHEST W/O 05/11/15 (personally reviewed by me): Opacity posterior segment left lower lobe measuring 3.6 cm in maximal dimension. Additional tree-in-bud opacities left lower lobe as well. 4 mm right lower lobe nodule & 4 mm left lower lobe nodule. No pleural effusion or thickening. No pericardial effusion. Apical predominant emphysematous changes. No pathologic mediastinal adenopathy.  LABS 05/15/15 CBC: 4.3/10.2/29.4/286 BMP: 135/3.2/94/34/<5/0.52/97/8.2

## 2015-07-16 ENCOUNTER — Telehealth: Payer: Self-pay | Admitting: Pulmonary Disease

## 2015-07-16 NOTE — Telephone Encounter (Signed)
Looks like pt had ct back in April 2017 She was a no show for HFU yesterday  Needs to reschedule this and CT chest- according to [facility]:  1. Lung Opacity: Continue antibiotic treatment. Plan for repeat CT chest w/o contrast in 4-6 weeks. 2. Follow-up: Patient has follow-up with me in clinic scheduled.  PCCM will sign off at this time. Please contact me if there are any further questions.  Donna Christen Jamison Neighbor, M.D. [facility]  ATC, NA and no option to leave msg, [facility]

## 2015-07-17 NOTE — Telephone Encounter (Signed)
ATC, NA and no option to leave msg 

## 2015-07-18 NOTE — Telephone Encounter (Signed)
atc pt X3, no answer, no vm.   Will close message.

## 2015-08-01 ENCOUNTER — Emergency Department (HOSPITAL_COMMUNITY): Payer: Medicare Other

## 2015-08-01 ENCOUNTER — Encounter (HOSPITAL_COMMUNITY): Payer: Self-pay

## 2015-08-01 ENCOUNTER — Emergency Department (HOSPITAL_COMMUNITY)
Admission: EM | Admit: 2015-08-01 | Discharge: 2015-08-01 | Disposition: A | Discharge status: Home / Self Care | Payer: Medicare Other | Attending: Emergency Medicine | Admitting: Emergency Medicine

## 2015-08-01 DIAGNOSIS — F17210 Nicotine dependence, cigarettes, uncomplicated: Secondary | ICD-10-CM | POA: Insufficient documentation

## 2015-08-01 DIAGNOSIS — J449 Chronic obstructive pulmonary disease, unspecified: Secondary | ICD-10-CM | POA: Diagnosis not present

## 2015-08-01 DIAGNOSIS — F1290 Cannabis use, unspecified, uncomplicated: Secondary | ICD-10-CM | POA: Insufficient documentation

## 2015-08-01 DIAGNOSIS — I252 Old myocardial infarction: Secondary | ICD-10-CM | POA: Insufficient documentation

## 2015-08-01 DIAGNOSIS — R42 Dizziness and giddiness: Secondary | ICD-10-CM | POA: Insufficient documentation

## 2015-08-01 DIAGNOSIS — Z79899 Other long term (current) drug therapy: Secondary | ICD-10-CM | POA: Diagnosis not present

## 2015-08-01 DIAGNOSIS — M069 Rheumatoid arthritis, unspecified: Secondary | ICD-10-CM | POA: Diagnosis not present

## 2015-08-01 LAB — BASIC METABOLIC PANEL
Anion gap: 9 (ref 5–15)
BUN: 8 mg/dL (ref 6–20)
CALCIUM: 9.5 mg/dL (ref 8.9–10.3)
CO2: 23 mmol/L (ref 22–32)
CREATININE: 0.63 mg/dL (ref 0.44–1.00)
Chloride: 94 mmol/L — ABNORMAL LOW (ref 101–111)
GFR calc non Af Amer: >60 mL/min (ref >60)
GLUCOSE: 122 mg/dL — AB (ref 65–99)
Potassium: 3.7 mmol/L (ref 3.5–5.1)
Sodium: 126 mmol/L — ABNORMAL LOW (ref 135–145)

## 2015-08-01 LAB — CBC WITH DIFFERENTIAL/PLATELET
BASOS PCT: 0 %
Basophils Absolute: 0 10*3/uL (ref 0.0–0.1)
EOS ABS: 0 10*3/uL (ref 0.0–0.7)
EOS PCT: 1 %
HCT: 34.1 % — ABNORMAL LOW (ref 36.0–46.0)
Hemoglobin: 12.3 g/dL (ref 12.0–15.0)
Lymphocytes Relative: 42 %
Lymphs Abs: 2 10*3/uL (ref 0.7–4.0)
MCH: 28 pg (ref 26.0–34.0)
MCHC: 36.1 g/dL — AB (ref 30.0–36.0)
MCV: 77.5 fL — ABNORMAL LOW (ref 78.0–100.0)
MONO ABS: 0.4 10*3/uL (ref 0.1–1.0)
MONOS PCT: 8 %
NEUTROS PCT: 49 %
Neutro Abs: 2.4 10*3/uL (ref 1.7–7.7)
Platelets: 194 10*3/uL (ref 150–400)
RBC: 4.4 MIL/uL (ref 3.87–5.11)
RDW: 14 % (ref 11.5–15.5)
WBC: 4.9 10*3/uL (ref 4.0–10.5)

## 2015-08-01 LAB — URINE MICROSCOPIC-ADD ON

## 2015-08-01 LAB — URINALYSIS, ROUTINE W REFLEX MICROSCOPIC
BILIRUBIN URINE: NEGATIVE
Glucose, UA: NEGATIVE mg/dL
KETONES UR: NEGATIVE mg/dL
Nitrite: NEGATIVE
Protein, ur: NEGATIVE mg/dL
Specific Gravity, Urine: 1.007 (ref 1.005–1.030)
pH: 7 (ref 5.0–8.0)

## 2015-08-01 LAB — SODIUM: SODIUM: 130 mmol/L — AB (ref 135–145)

## 2015-08-01 MED ORDER — METRONIDAZOLE 500 MG PO TABS
2000.0000 mg | ORAL_TABLET | Freq: Once | ORAL | Status: AC
  Administered 2015-08-01: 2000 mg via ORAL
  Filled 2015-08-01: qty 4

## 2015-08-01 MED ORDER — DM-GUAIFENESIN ER 30-600 MG PO TB12
1.0000 | ORAL_TABLET | Freq: Two times a day (BID) | ORAL | Status: DC
  Administered 2015-08-01: 1 via ORAL
  Filled 2015-08-01 (×2): qty 1

## 2015-08-01 MED ORDER — GUAIFENESIN ER 600 MG PO TB12: 600.0000 mg | ORAL_TABLET | Freq: Two times a day (BID) | ORAL | Status: DC

## 2015-08-01 MED ORDER — IPRATROPIUM-ALBUTEROL 0.5-2.5 (3) MG/3ML IN SOLN
3.0000 mL | Freq: Once | RESPIRATORY_TRACT | Status: AC
Start: 2015-08-01 — End: 2015-08-01
  Administered 2015-08-01: 3 mL via RESPIRATORY_TRACT
  Filled 2015-08-01: qty 3

## 2015-08-01 MED ORDER — SODIUM CHLORIDE 0.9 % IV BOLUS (SEPSIS)
1000.0000 mL | Freq: Once | INTRAVENOUS | Status: AC
  Administered 2015-08-01: 1000 mL via INTRAVENOUS

## 2015-08-01 NOTE — ED Provider Notes (Signed)
CSN: [id number]     Arrival date & time 08/01/15  1203 History   First MD Initiated Contact with Patient 08/01/15 1248     Chief Complaint  Patient presents with  . Dizziness      HPI Comments: 60 year old female presents with dizziness. Past medical history significant for COPD, hyponatremia, alcohol abuse, tobacco abuse, cocaine abuse, rheumatoid arthritis. Patient states that she was going to the bus stop today when she was feeling lightheaded and dizzy. A bystander called EMS and when they arrived to the scene her blood pressure was 78/52. Patient reports associated spots in her vision. Patient denies fever, chills, loss of consciousness, headache, chest pain, worsening shortness of breath, abdominal pain, nausea, vomiting, dysuria, unilateral weakness, sudden loss of vision, loss of balance. She reports eating and drinking normally.      Patient is a 60 y.o. female presenting with dizziness. The history is provided by the patient.  Dizziness Associated symptoms: no chest pain, no headaches, no shortness of breath and no weakness     Past Medical History  Diagnosis Date  . Rheumatoid arthritis(714.0)   . Bronchitis   . Myocardial infarction (HCC)   . Pneumonia   . COPD (chronic obstructive pulmonary disease) (HCC)   . Chronic respiratory failure (HCC)   . Hyponatremia   . Alcohol abuse   . Cocaine abuse   . Tobacco abuse    Past Surgical History  Procedure Laterality Date  . Tonsillectomy    . Tubal ligation     Family History  Problem Relation Age of Onset  . Coronary artery disease    . Diabetes type II     Social History  Substance Use Topics  . Smoking status: Current Every Day Smoker -- 1.00 packs/day for 39 years    Types: Cigarettes  . Smokeless tobacco: Never Used  . Alcohol Use: Yes     Comment: quart of beer per day   OB History    No data available     Review of Systems  Respiratory: Negative for shortness of breath.   Cardiovascular: Negative  for chest pain.  Neurological: Positive for dizziness. Negative for syncope, weakness, numbness and headaches.  All other systems reviewed and are negative.     Allergies  Review of patient's allergies indicates no known allergies.  Home Medications   Prior to Admission medications   Medication Sig Start Date End Date Taking? Authorizing Provider  acetaminophen (TYLENOL) 325 MG tablet Take 2 tablets (650 mg total) by mouth every 6 (six) hours as needed for mild pain (or Fever >/= 101). 05/15/15  Yes Jessica U Vann, DO  arformoterol (BROVANA) 15 MCG/2ML NEBU Take 2 mLs (15 mcg total) by nebulization 2 (two) times daily. 05/15/15  Yes Joseph Art, DO  benzonatate (TESSALON) 100 MG capsule Take 1 capsule (100 mg total) by mouth 3 (three) times daily. 05/15/15  Yes Jessica U Vann, DO  albuterol (PROVENTIL) (5 MG/ML) 0.5% nebulizer solution Take 0.5 mLs (2.5 mg total) by nebulization every 6 (six) hours. Patient not taking: Reported on 05/09/2015 04/25/12   Simonne Martinet, NP  antiseptic oral rinse (BIOTENE) LIQD 15 mLs by Mouth Rinse route QID. Patient not taking: Reported on 05/09/2015 04/25/12   Simonne Martinet, NP  bisacodyl (DULCOLAX) 10 MG suppository Place 1 suppository (10 mg total) rectally daily as needed. Patient not taking: Reported on 05/09/2015 04/25/12   Simonne Martinet, NP  budesonide (PULMICORT) 0.5 MG/2ML nebulizer solution Take 2 mLs (  0.5 mg total) by nebulization 2 (two) times daily. Patient not taking: Reported on 05/09/2015 04/25/12   Simonne Martinet, NP  docusate (COLACE) 50 MG/5ML liquid Place 10 mLs (100 mg total) into feeding tube 2 (two) times daily. Patient not taking: Reported on 05/09/2015 04/25/12   Simonne Martinet, NP  guaiFENesin (MUCINEX) 600 MG 12 hr tablet Take 1 tablet (600 mg total) by mouth 2 (two) times daily. Patient not taking: Reported on 08/01/2015 05/15/15   Joseph Art, DO  HYDROcodone-acetaminophen (NORCO/VICODIN) 5-325 MG tablet Take 0.5 tablets by mouth  every 4 (four) hours as needed for moderate pain. Patient not taking: Reported on 08/01/2015 05/15/15   Joseph Art, DO  levofloxacin (LEVAQUIN) 750 MG tablet Take 1 tablet (750 mg total) by mouth daily before breakfast. Patient not taking: Reported on 08/01/2015 05/15/15   Joseph Art, DO  Nutritional Supplements (FEEDING SUPPLEMENT, JEVITY 1.2 CAL,) LIQD Place 1,000 mLs into feeding tube daily. Patient not taking: Reported on 05/09/2015 04/25/12   Simonne Martinet, NP   BP 92/74 mmHg  Pulse 72  Temp(Src) 97.7 F (36.5 C)  Resp 22  SpO2 100%   Physical Exam  Constitutional: She is oriented to person, place, and time. She appears well-developed and well-nourished. No distress.  Thin   HENT:  Head: Normocephalic and atraumatic.  Eyes: Conjunctivae are normal. Pupils are equal, round, and reactive to light. Right eye exhibits no discharge. Left eye exhibits no discharge. No scleral icterus.  Neck: Normal range of motion.  Cardiovascular: Normal rate and regular rhythm.  Exam reveals no gallop and no friction rub.   No murmur heard. Pulmonary/Chest: Effort normal and breath sounds normal. No respiratory distress. She has no wheezes. She has no rales. She exhibits no tenderness.  Coarse scattered rhonchi throughout all lung fields  Abdominal: Soft. She exhibits no distension. There is no tenderness.  Neurological: She is alert and oriented to person, place, and time.  Mental Status:  Alert, oriented, thought content appropriate, able to give a coherent history. Speech fluent without evidence of aphasia. Able to follow 2 step commands without difficulty.  Cranial Nerves:  II:  Peripheral visual fields grossly normal, pupils equal, round, reactive to light III,IV, VI: ptosis not present, extra-ocular motions intact bilaterally  V,VII: smile symmetric, facial light touch sensation equal VIII: hearing grossly normal to voice  X: uvula elevates symmetrically  XI: bilateral shoulder shrug  symmetric and strong XII: midline tongue extension without fassiculations Motor:  Normal tone. 5/5 in upper and lower extremities bilaterally including strong and equal grip strength and dorsiflexion/plantar flexion Sensory: Pinprick and light touch normal in all extremities.  Deep Tendon Reflexes: 2+ and symmetric in the biceps and patella Cerebellar: normal finger-to-nose with bilateral upper extremities Gait: normal gait and balance CV: distal pulses palpable throughout     Skin: Skin is warm and dry.  Psychiatric: She has a normal mood and affect. Her behavior is normal.    ED Course  Procedures (including critical care time) Labs Review Labs Reviewed  BASIC METABOLIC PANEL - Abnormal; Notable for the following:    Sodium 126 (*)    Chloride 94 (*)    Glucose, Bld 122 (*)    All other components within normal limits  CBC WITH DIFFERENTIAL/PLATELET - Abnormal; Notable for the following:    HCT 34.1 (*)    MCV 77.5 (*)    MCHC 36.1 (*)    All other components within normal limits  URINALYSIS,  ROUTINE W REFLEX MICROSCOPIC (NOT AT [facility]) - Abnormal; Notable for the following:    APPearance CLOUDY (*)    Hgb urine dipstick SMALL (*)    Leukocytes, UA MODERATE (*)    All other components within normal limits  SODIUM - Abnormal; Notable for the following:    Sodium 130 (*)    All other components within normal limits  URINE MICROSCOPIC-ADD ON - Abnormal; Notable for the following:    Squamous Epithelial / LPF 6-30 (*)    Bacteria, UA FEW (*)    Casts HYALINE CASTS (*)    All other components within normal limits  URINE CULTURE    Imaging Review Dg Chest 2 View  08/01/2015  CLINICAL DATA:  Per EMS- The patient reports that she was walking home today and then began feeling dizzy. No LOC, N/V, or CP. Patient states she felt better after being in air conditioning. EXAM: CHEST  2 VIEW COMPARISON:  05/09/2015, CT 05/11/2015 FINDINGS: Lungs are hyperinflated. Heart size is normal.  There is mild prominence of interstitial markings. Perihilar bronchitic changes are present. Coarse scarring is again identified in the lung apices, stable since recent CT exam. IMPRESSION: Hyperinflation. Bronchitic changes. Stable biapical changes. Electronically Signed   By: Norva Pavlov M.D.   On: 08/01/2015 15:41   I have personally reviewed and evaluated these images and lab results as part of my medical decision-making.   EKG Interpretation None      MDM   Final diagnoses:  Dizziness   60 year old female presents with dizziness. She is hypotensive on arrival. Her BP and dizziness has improved with 2L bolus. CBC remarkable for mild stable anemia. BMP remarkable for Na of 126 and glucose of 122. Patient does have hx of hyponatremia. On recheck Na is 130 which is around her baseline. UA remarkable for small hgb, moderate leukocytes, few bacteria and hyaline casts with trichomonas. 2g Flagyl given. Specimen appears to be contaminated - will send of culture as patient is not having any urinary complaints. CXR obtained to evaluate rhonchi. Patient has COPD with no acute changes. Patient is NAD, non-toxic, with improved VS. She is asking for food and sodas and appears safe for d/c. Patient is informed of clinical course, understands medical decision making process, and agrees with plan. Opportunity for questions provided and all questions answered. Return precautions given.    Bethel Born, PA-C 08/02/15 1834  Mancel Bale, MD 08/03/15 [phone]

## 2015-08-01 NOTE — ED Notes (Signed)
Per EMS- The patient reports that she was walking home and then began feeling dizzy. No LOC, N/V, or CP. Patient states she felt better after being in air conditioning. Initial BP-78/52, HR-88, CBG-154.  Prior to arrival tot he ED BP-91/60, HR-80.

## 2015-08-01 NOTE — Discharge Instructions (Signed)

## 2015-08-01 NOTE — ED Notes (Signed)
Bed: GL87 Expected date:  Expected time:  Means of arrival:  Comments: EMS - 6 F - dizzy, hypotensive - AOA

## 2015-08-03 LAB — URINE CULTURE

## 2016-12-22 ENCOUNTER — Emergency Department (HOSPITAL_COMMUNITY): Payer: Medicare Other

## 2016-12-22 ENCOUNTER — Encounter (HOSPITAL_COMMUNITY): Payer: Self-pay | Admitting: *Deleted

## 2016-12-22 ENCOUNTER — Inpatient Hospital Stay (HOSPITAL_COMMUNITY)
Admission: EM | Admit: 2016-12-22 | Discharge: 2017-01-01 | DRG: 682 | Disposition: A | Discharge status: Skilled Nursing Facility | Payer: Medicare Other | Attending: Family Medicine | Admitting: Family Medicine

## 2016-12-22 DIAGNOSIS — J439 Emphysema, unspecified: Secondary | ICD-10-CM | POA: Diagnosis not present

## 2016-12-22 DIAGNOSIS — F1910 Other psychoactive substance abuse, uncomplicated: Secondary | ICD-10-CM | POA: Diagnosis present

## 2016-12-22 DIAGNOSIS — E860 Dehydration: Secondary | ICD-10-CM | POA: Diagnosis present

## 2016-12-22 DIAGNOSIS — I5022 Chronic systolic (congestive) heart failure: Secondary | ICD-10-CM | POA: Diagnosis present

## 2016-12-22 DIAGNOSIS — Z720 Tobacco use: Secondary | ICD-10-CM | POA: Diagnosis present

## 2016-12-22 DIAGNOSIS — I110 Hypertensive heart disease with heart failure: Secondary | ICD-10-CM | POA: Diagnosis present

## 2016-12-22 DIAGNOSIS — E271 Primary adrenocortical insufficiency: Secondary | ICD-10-CM | POA: Diagnosis present

## 2016-12-22 DIAGNOSIS — R0989 Other specified symptoms and signs involving the circulatory and respiratory systems: Secondary | ICD-10-CM

## 2016-12-22 DIAGNOSIS — Z681 Body mass index (BMI) 19 or less, adult: Secondary | ICD-10-CM

## 2016-12-22 DIAGNOSIS — F1010 Alcohol abuse, uncomplicated: Secondary | ICD-10-CM | POA: Diagnosis not present

## 2016-12-22 DIAGNOSIS — R6251 Failure to thrive (child): Secondary | ICD-10-CM | POA: Diagnosis present

## 2016-12-22 DIAGNOSIS — R627 Adult failure to thrive: Secondary | ICD-10-CM | POA: Diagnosis present

## 2016-12-22 DIAGNOSIS — F17210 Nicotine dependence, cigarettes, uncomplicated: Secondary | ICD-10-CM | POA: Diagnosis present

## 2016-12-22 DIAGNOSIS — E876 Hypokalemia: Secondary | ICD-10-CM | POA: Diagnosis present

## 2016-12-22 DIAGNOSIS — E559 Vitamin D deficiency, unspecified: Secondary | ICD-10-CM | POA: Diagnosis present

## 2016-12-22 DIAGNOSIS — F1410 Cocaine abuse, uncomplicated: Secondary | ICD-10-CM | POA: Diagnosis present

## 2016-12-22 DIAGNOSIS — I959 Hypotension, unspecified: Secondary | ICD-10-CM | POA: Diagnosis present

## 2016-12-22 DIAGNOSIS — E43 Unspecified severe protein-calorie malnutrition: Secondary | ICD-10-CM | POA: Diagnosis present

## 2016-12-22 DIAGNOSIS — R0602 Shortness of breath: Secondary | ICD-10-CM

## 2016-12-22 DIAGNOSIS — I252 Old myocardial infarction: Secondary | ICD-10-CM

## 2016-12-22 DIAGNOSIS — Z79899 Other long term (current) drug therapy: Secondary | ICD-10-CM

## 2016-12-22 DIAGNOSIS — R059 Cough, unspecified: Secondary | ICD-10-CM

## 2016-12-22 DIAGNOSIS — R05 Cough: Secondary | ICD-10-CM

## 2016-12-22 DIAGNOSIS — I10 Essential (primary) hypertension: Secondary | ICD-10-CM | POA: Diagnosis present

## 2016-12-22 DIAGNOSIS — E871 Hypo-osmolality and hyponatremia: Secondary | ICD-10-CM | POA: Diagnosis present

## 2016-12-22 DIAGNOSIS — N179 Acute kidney failure, unspecified: Secondary | ICD-10-CM | POA: Diagnosis not present

## 2016-12-22 DIAGNOSIS — D509 Iron deficiency anemia, unspecified: Secondary | ICD-10-CM | POA: Diagnosis present

## 2016-12-22 DIAGNOSIS — L8990 Pressure ulcer of unspecified site, unspecified stage: Secondary | ICD-10-CM

## 2016-12-22 LAB — CBC WITH DIFFERENTIAL/PLATELET
BASOS ABS: 0 10*3/uL (ref 0.0–0.1)
BASOS PCT: 0 %
EOS ABS: 0 10*3/uL (ref 0.0–0.7)
Eosinophils Relative: 0 %
HCT: 42 % (ref 36.0–46.0)
Hemoglobin: 16.2 g/dL — ABNORMAL HIGH (ref 12.0–15.0)
Lymphocytes Relative: 29 %
Lymphs Abs: 1.8 10*3/uL (ref 0.7–4.0)
MCH: 29.1 pg (ref 26.0–34.0)
MCHC: 38.6 g/dL — AB (ref 30.0–36.0)
MCV: 75.5 fL — ABNORMAL LOW (ref 78.0–100.0)
Monocytes Absolute: 0.5 10*3/uL (ref 0.1–1.0)
Monocytes Relative: 9 %
NEUTROS PCT: 62 %
Neutro Abs: 3.8 10*3/uL (ref 1.7–7.7)
PLATELETS: 165 10*3/uL (ref 150–400)
RBC: 5.56 MIL/uL — ABNORMAL HIGH (ref 3.87–5.11)
RDW: 12.9 % (ref 11.5–15.5)
WBC: 6.2 10*3/uL (ref 4.0–10.5)

## 2016-12-22 LAB — RAPID URINE DRUG SCREEN, HOSP PERFORMED
AMPHETAMINES: NOT DETECTED
BARBITURATES: NOT DETECTED
BENZODIAZEPINES: NOT DETECTED
Cocaine: NOT DETECTED
Opiates: NOT DETECTED
TETRAHYDROCANNABINOL: NOT DETECTED

## 2016-12-22 LAB — COMPREHENSIVE METABOLIC PANEL
ALK PHOS: 73 U/L (ref 38–126)
ALT: 21 U/L (ref 14–54)
ANION GAP: 18 — AB (ref 5–15)
AST: 39 U/L (ref 15–41)
Albumin: 4.8 g/dL (ref 3.5–5.0)
BILIRUBIN TOTAL: 1.5 mg/dL — AB (ref 0.3–1.2)
BUN: 42 mg/dL — ABNORMAL HIGH (ref 6–20)
CALCIUM: 9.6 mg/dL (ref 8.9–10.3)
CO2: 17 mmol/L — ABNORMAL LOW (ref 22–32)
Chloride: 87 mmol/L — ABNORMAL LOW (ref 101–111)
Creatinine, Ser: 1.61 mg/dL — ABNORMAL HIGH (ref 0.44–1.00)
GFR, EST AFRICAN AMERICAN: 39 mL/min — AB (ref >60)
GFR, EST NON AFRICAN AMERICAN: 34 mL/min — AB (ref >60)
Glucose, Bld: 130 mg/dL — ABNORMAL HIGH (ref 65–99)
Potassium: 4.3 mmol/L (ref 3.5–5.1)
Sodium: 122 mmol/L — ABNORMAL LOW (ref 135–145)
TOTAL PROTEIN: 8.9 g/dL — AB (ref 6.5–8.1)

## 2016-12-22 LAB — URINALYSIS, ROUTINE W REFLEX MICROSCOPIC
Bilirubin Urine: NEGATIVE
Glucose, UA: 50 mg/dL — AB
Ketones, ur: NEGATIVE mg/dL
Leukocytes, UA: NEGATIVE
Nitrite: NEGATIVE
PROTEIN: NEGATIVE mg/dL
SPECIFIC GRAVITY, URINE: 1.005 (ref 1.005–1.030)
SQUAMOUS EPITHELIAL / LPF: NONE SEEN
pH: 6 (ref 5.0–8.0)

## 2016-12-22 LAB — I-STAT CHEM 8, ED
BUN: 39 mg/dL — AB (ref 6–20)
CHLORIDE: 91 mmol/L — AB (ref 101–111)
Calcium, Ion: 1.01 mmol/L — ABNORMAL LOW (ref 1.15–1.40)
Creatinine, Ser: 1.5 mg/dL — ABNORMAL HIGH (ref 0.44–1.00)
Glucose, Bld: 131 mg/dL — ABNORMAL HIGH (ref 65–99)
HEMATOCRIT: 50 % — AB (ref 36.0–46.0)
Hemoglobin: 17 g/dL — ABNORMAL HIGH (ref 12.0–15.0)
Potassium: 4.3 mmol/L (ref 3.5–5.1)
SODIUM: 123 mmol/L — AB (ref 135–145)
TCO2: 20 mmol/L — ABNORMAL LOW (ref 22–32)

## 2016-12-22 LAB — ETHANOL

## 2016-12-22 LAB — I-STAT CG4 LACTIC ACID, ED
Lactic Acid, Venous: 3.6 mmol/L (ref 0.5–1.9)
Lactic Acid, Venous: 2.19 mmol/L (ref 0.5–1.9)

## 2016-12-22 MED ORDER — VANCOMYCIN HCL IN DEXTROSE 1-5 GM/200ML-% IV SOLN
1000.0000 mg | Freq: Once | INTRAVENOUS | Status: AC
  Administered 2016-12-22: 1000 mg via INTRAVENOUS
  Filled 2016-12-22: qty 200

## 2016-12-22 MED ORDER — PIPERACILLIN-TAZOBACTAM 3.375 G IVPB 30 MIN
3.3750 g | Freq: Once | INTRAVENOUS | Status: AC
  Administered 2016-12-22: 3.375 g via INTRAVENOUS
  Filled 2016-12-22: qty 50

## 2016-12-22 MED ORDER — SODIUM CHLORIDE 0.9 % IV BOLUS (SEPSIS)
2000.0000 mL | Freq: Once | INTRAVENOUS | Status: AC
  Administered 2016-12-22: 2000 mL via INTRAVENOUS

## 2016-12-22 MED ORDER — THIAMINE HCL 100 MG/ML IJ SOLN
Freq: Once | INTRAVENOUS | Status: AC
  Administered 2016-12-23: 01:00:00 via INTRAVENOUS
  Filled 2016-12-22: qty 1000

## 2016-12-22 NOTE — ED Triage Notes (Signed)
Transported by [facility] from home. Patient lives alone. Family went to check on patient and noticed a change in mental status. Per family, patient is not at her baseline. Per EMS, patient alert and oriented x 3 but just slow to respond. Patient reports generalized weakness. EMS stroke screen negative.

## 2016-12-22 NOTE — Progress Notes (Signed)
Call report to Autumn  [phone]

## 2016-12-22 NOTE — ED Provider Notes (Signed)
[facility]-EMERGENCY DEPT Provider Note   CSN: [id number] Arrival date & time: 12/22/16  1839     History   Chief Complaint Chief Complaint  Patient presents with  . Weakness    HPI Jahnasia L Zarazua is a 61 y.o. female.  Patient was found at home very weak today.  She states that she has not been eating or drinking anything for the last 3 days   The history is provided by the patient and a relative. No language interpreter was used.  Weakness  Primary symptoms include no focal weakness. This is a recurrent problem. The current episode started more than 2 days ago. The problem has not changed since onset.There was no focality noted. There has been no fever. Pertinent negatives include no shortness of breath, no chest pain, no vomiting and no headaches. There were no medications administered prior to arrival. Associated medical issues do not include trauma.    Past Medical History:  Diagnosis Date  . Alcohol abuse   . Bronchitis   . Chronic respiratory failure (HCC)   . Cocaine abuse (HCC)   . COPD (chronic obstructive pulmonary disease) (HCC)   . Hyponatremia   . Myocardial infarction (HCC)   . Pneumonia   . Rheumatoid arthritis(714.0)   . Tobacco abuse     Patient Active Problem List   Diagnosis Date Noted  . FTT (failure to thrive) in adult 12/22/2016  . Weight loss   . UTI (lower urinary tract infection) 05/10/2015  . Failure to thrive (0-17) 05/10/2015  . Abdominal pain 05/10/2015  . Failure to thrive in adult 05/10/2015  . Acute kidney injury (HCC) 05/10/2015  . UTI (urinary tract infection) 05/10/2015  . Dehydration   . AKI (acute kidney injury) (HCC)   . Anemia of other chronic disease 06/29/2012  . HCAP (healthcare-associated pneumonia) 05/02/2012  . Tracheostomy status (HCC) 04/26/2012  . PSVT (paroxysmal supraventricular tachycardia) (HCC) 04/18/2012  . Agitation 04/18/2012  . Delirium 04/18/2012  . Acute respiratory failure  (HCC) 04/13/2012  . COPD exacerbation (HCC) 04/13/2012  . Severe protein-calorie malnutrition (HCC) 04/12/2012  . Tobacco abuse   . Cocaine abuse (HCC)   . Alcohol abuse   . Persistent pneumonia 02/15/2012  . Chronic respiratory failure with hypoxia (HCC) 02/15/2012  . Trichomoniasis 02/13/2012  . Chloride, decreased level 02/12/2012  . Malnutrition (HCC) 02/12/2012  . Acute respiratory failure with hypoxia (HCC) 10/29/2011  . Leukocytosis 10/28/2011  . Hyponatremia 10/28/2011  . Hypokalemia 10/28/2011  . Systolic CHF, chronic (HCC) 10/28/2011  . CAP (community acquired pneumonia) 10/28/2011  . SUBSTANCE ABUSE, MULTIPLE 01/19/2008  . HYPERTENSION 06/29/2007  . CANDIDIASIS, MOUTH (THRUSH) 07/26/2006  . 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 TONSILLECTOMY    . TUBAL LIGATION      OB History    No data available       Home Medications    Prior to Admission medications   Medication Sig Start Date End Date Taking? Authorizing Provider  acetaminophen (TYLENOL) 325 MG tablet Take 2 tablets (650 mg total) by mouth every 6 (six) hours as needed for mild pain (or Fever >/= 101). 05/15/15  Yes Vann, Jessica U, DO  arformoterol (BROVANA) 15 MCG/2ML NEBU Take 2 mLs (15 mcg total) by nebulization 2 (two) times daily. Patient not taking: Reported on 12/22/2016 05/15/15    Art, DO  benzonatate (TESSALON) 100 MG capsule Take 1 capsule (100 mg total) by  mouth 3 (three) times daily. Patient not taking: Reported on 12/22/2016 05/15/15    ArtVann, Jessica U, DO  guaiFENesin (MUCINEX) 600 MG 12 hr tablet Take 1 tablet (600 mg total) by mouth 2 (two) times daily. Patient not taking: Reported on 12/22/2016 08/01/15   Bethel BornGekas, Kelly Marie, PA-C    Family History Family History  Problem Relation Age of Onset  . Coronary artery disease Unknown   . Diabetes type II Unknown     Social History Social History   Tobacco Use  . Smoking  status: Current Every Day Smoker    Packs/day: 1.00    Years: 39.00    Pack years: 39.00    Types: Cigarettes  . Smokeless tobacco: Never Used  Substance Use Topics  . Alcohol use: Yes    Comment: quart of beer per day  . Drug use: Yes    Types: Cocaine, Marijuana    Comment: quit crack approx. 6 months ago, quit marijuana     Allergies   Patient has no known allergies.   Review of Systems Review of Systems  Constitutional: Negative for appetite change and fatigue.  HENT: Negative for congestion, ear discharge and sinus pressure.   Eyes: Negative for discharge.  Respiratory: Negative for cough and shortness of breath.   Cardiovascular: Negative for chest pain.  Gastrointestinal: Negative for abdominal pain, diarrhea and vomiting.  Genitourinary: Negative for frequency and hematuria.  Musculoskeletal: Negative for back pain.  Skin: Negative for rash.  Neurological: Positive for weakness. Negative for focal weakness, seizures and headaches.  Psychiatric/Behavioral: Negative for hallucinations.     Physical Exam Updated Vital Signs BP (!) 145/104   Pulse 64   Temp (S) (!) 97 F (36.1 C) (Rectal)   Resp 13   SpO2 100%   Physical Exam  Constitutional: She is oriented to person, place, and time.  Patient is cachectic  HENT:  Head: Normocephalic.  Dry mucous membranes  Eyes: Conjunctivae and EOM are normal. No scleral icterus.  Neck: Neck supple. No thyromegaly present.  Cardiovascular: Normal rate and regular rhythm. Exam reveals no gallop and no friction rub.  No murmur heard. Pulmonary/Chest: No stridor. She has no wheezes. She has no rales. She exhibits no tenderness.  Abdominal: She exhibits no distension. There is no tenderness. There is no rebound.  Musculoskeletal: Normal range of motion. She exhibits no edema.  Lymphadenopathy:    She has no cervical adenopathy.  Neurological: She is oriented to person, place, and time. She exhibits normal muscle tone.  Coordination normal.  Skin: No rash noted. No erythema.  Psychiatric: She has a normal mood and affect. Her behavior is normal.     ED Treatments / Results  Labs (all labs ordered are listed, but only abnormal results are displayed) Labs Reviewed  COMPREHENSIVE METABOLIC PANEL - Abnormal; Notable for the following components:      Result Value   Sodium 122 (*)    Chloride 87 (*)    CO2 17 (*)    Glucose, Bld 130 (*)    BUN 42 (*)    Creatinine, Ser 1.61 (*)    Total Protein 8.9 (*)    Total Bilirubin 1.5 (*)    GFR calc non Af Amer 34 (*)    GFR calc Af Amer 39 (*)    Anion gap 18 (*)    All other components within normal limits  CBC WITH DIFFERENTIAL/PLATELET - Abnormal; Notable for the following components:   RBC 5.56 (*)  Hemoglobin 16.2 (*)    MCV 75.5 (*)    MCHC 38.6 (*)    All other components within normal limits  URINALYSIS, ROUTINE W REFLEX MICROSCOPIC - Abnormal; Notable for the following components:   Color, Urine STRAW (*)    Glucose, UA 50 (*)    Hgb urine dipstick MODERATE (*)    Bacteria, UA RARE (*)    All other components within normal limits  I-STAT CG4 LACTIC ACID, ED - Abnormal; Notable for the following components:   Lactic Acid, Venous 3.60 (*)    All other components within normal limits  I-STAT CHEM 8, ED - Abnormal; Notable for the following components:   Sodium 123 (*)    Chloride 91 (*)    BUN 39 (*)    Creatinine, Ser 1.50 (*)    Glucose, Bld 131 (*)    Calcium, Ion 1.01 (*)    TCO2 20 (*)    Hemoglobin 17.0 (*)    HCT 50.0 (*)    All other components within normal limits  I-STAT CG4 LACTIC ACID, ED - Abnormal; Notable for the following components:   Lactic Acid, Venous 2.19 (*)    All other components within normal limits  CULTURE, BLOOD (ROUTINE X 2)  CULTURE, BLOOD (ROUTINE X 2)  URINE CULTURE  ETHANOL  RAPID URINE DRUG SCREEN, HOSP PERFORMED    EKG  EKG Interpretation  Date/Time:  Tuesday December 22 2016 19:50:08  EST Ventricular Rate:  110 PR Interval:    QRS Duration: 92 QT Interval:  348 QTC Calculation: 471 R Axis:   -121 Text Interpretation:  Age not entered, assumed to be  61 years old for purpose of ECG interpretation Sinus tachycardia Right atrial enlargement Probable lateral infarct, old Confirmed by Bethann Berkshire [id number]) on 12/22/2016 10:07:43 PM       Radiology Dg Chest Port 1 View  Result Date: 12/22/2016 CLINICAL DATA:  Shortness of breath and weakness. EXAM: PORTABLE CHEST 1 VIEW COMPARISON:  08/01/2015 FINDINGS: Normal heart size. No pleural effusion or edema. The lungs are hyperinflated. There are coarsened interstitial markings throughout both lungs compatible with emphysema. No superimposed airspace consolidation identified. IMPRESSION: 1. No acute findings. 2. Emphysema. Electronically Signed   By: Signa Kell M.D.   On: 12/22/2016 20:17    Procedures Procedures (including critical care time)  Medications Ordered in ED Medications  sodium chloride 0.9 % bolus 2,000 mL (0 mLs Intravenous Stopped 12/22/16 2146)  vancomycin (VANCOCIN) IVPB 1000 mg/200 mL premix (0 mg Intravenous Stopped 12/22/16 2106)  piperacillin-tazobactam (ZOSYN) IVPB 3.375 g (0 g Intravenous Stopped 12/22/16 2035)     Initial Impression / Assessment and Plan / ED Course  I have reviewed the triage vital signs and the nursing notes.  Pertinent labs & imaging results that were available during my care of the patient were reviewed by me and considered in my medical decision making (see chart for details).    CRITICAL CARE Performed by: Trevelle Mcgurn L Total critical care time: 45 minutes Critical care time was exclusive of separately billable procedures and treating other patients. Critical care was necessary to treat or prevent imminent or life-threatening deterioration. Critical care was time spent personally by me on the following activities: development of treatment plan with patient and/or  surrogate as well as nursing, discussions with consultants, evaluation of patient's response to treatment, examination of patient, obtaining history from patient or surrogate, ordering and performing treatments and interventions, ordering and review of laboratory studies, ordering and review  of radiographic studies, pulse oximetry and re-evaluation of patient's condition.   Patient presented with hypotension.  Sepsis protocol was initiated.  Patient responded to IV fluids.  Patient has AK I and hyponatremia.  She will be admitted to the medicine service  Final Clinical Impressions(s) / ED Diagnoses   Final diagnoses:  Dehydration    ED Discharge Orders    None       Bethann Berkshire, MD 12/22/16 2312

## 2016-12-22 NOTE — H&P (Signed)
History and Physical    Jennifer Hurley:[id number] DOB: [birth year] DOA: 12/22/2016  PCP: Rometta Emery, MD  Patient coming from:  home  Chief Complaint:   Family found, not eating and drinking  HPI: Jennifer Hurley is a 61 y.o. female with medical history significant of etoh and polysubstance abuse, FTT, copd comes in after family found her at home not eating and drinking very well.  Pt denies any pain anywhere.  She denies any n/v/d although this was reported earlier to ED staff.  She denies any fevers.  Pt found to have hyponatremia and aki and dehydration and referred for admission for such.  She was initially with low bp and then with high bp in ED.  Her ROS is grossly negative.  Review of Systems: As per HPI otherwise 10 point review of systems negative.   Past Medical History:  Diagnosis Date  . Alcohol abuse   . Bronchitis   . Chronic respiratory failure (HCC)   . Cocaine abuse (HCC)   . COPD (chronic obstructive pulmonary disease) (HCC)   . Hyponatremia   . Myocardial infarction (HCC)   . Pneumonia   . Rheumatoid arthritis(714.0)   . Tobacco abuse     Past Surgical History:  Procedure Laterality Date  . TONSILLECTOMY    . TUBAL LIGATION       reports that she has been smoking cigarettes.  She has a 39.00 pack-year smoking history. she has never used smokeless tobacco. She reports that she drinks alcohol. She reports that she uses drugs. Drugs: Cocaine and Marijuana.  No Known Allergies  Family History  Problem Relation Age of Onset  . Coronary artery disease Unknown   . Diabetes type II Unknown     Prior to Admission medications   Medication Sig Start Date End Date Taking? Authorizing Provider  acetaminophen (TYLENOL) 325 MG tablet Take 2 tablets (650 mg total) by mouth every 6 (six) hours as needed for mild pain (or Fever >/= 101). 05/15/15  Yes Vann, Jessica U, DO  arformoterol (BROVANA) 15 MCG/2ML NEBU Take 2 mLs (15 mcg total) by nebulization 2 (two)  times daily. Patient not taking: Reported on 12/22/2016 05/15/15   Joseph Art, DO  benzonatate (TESSALON) 100 MG capsule Take 1 capsule (100 mg total) by mouth 3 (three) times daily. Patient not taking: Reported on 12/22/2016 05/15/15   Joseph Art, DO  guaiFENesin (MUCINEX) 600 MG 12 hr tablet Take 1 tablet (600 mg total) by mouth 2 (two) times daily. Patient not taking: Reported on 12/22/2016 08/01/15   Bethel Born, PA-C    Physical Exam: Vitals:   12/22/16 2130 12/22/16 2145 12/22/16 2200 12/22/16 2300  BP: (!) 168/125  (!) 138/107 (!) 145/104  Pulse:  (!) 112 (!) 104 64  Resp: (!) 24 16 16 13   Temp:      TempSrc:      SpO2: 100% 97% 98% 100%    Constitutional: NAD, calm, comfortable, extremely cachectic Vitals:   12/22/16 2130 12/22/16 2145 12/22/16 2200 12/22/16 2300  BP: (!) 168/125  (!) 138/107 (!) 145/104  Pulse:  (!) 112 (!) 104 64  Resp: (!) 24 16 16 13   Temp:      TempSrc:      SpO2: 100% 97% 98% 100%   Eyes: PERRL, lids and conjunctivae normal ENMT: Mucous membranes are dry. Posterior pharynx clear of any exudate or lesions.Normal dentition.  Neck: normal, supple, no masses, no thyromegaly Respiratory: clear to auscultation  bilaterally, no wheezing, no crackles. Normal respiratory effort. No accessory muscle use.  Cardiovascular: Regular rate and rhythm, no murmurs / rubs / gallops. No extremity edema. 2+ pedal pulses. No carotid bruits.  Abdomen: no tenderness, no masses palpated. No hepatosplenomegaly. Bowel sounds positive.  Musculoskeletal: no clubbing / cyanosis. No joint deformity upper and lower extremities. Good ROM, no contractures. Normal muscle tone.  Skin: no rashes, lesions, ulcers. No induration Neurologic: CN 2-12 grossly intact. Sensation intact, DTR normal. Strength 5/5 in all 4.  Psychiatric: Normal judgment and insight. Alert and oriented x 3. Normal mood.    Labs on Admission: I have personally reviewed following labs and imaging  studies  CBC: Recent Labs  Lab 12/22/16 1955 12/22/16 2009  WBC 6.2  --   NEUTROABS 3.8  --   HGB 16.2* 17.0*  HCT 42.0 50.0*  MCV 75.5*  --   PLT 165  --    Basic Metabolic Panel: Recent Labs  Lab 12/22/16 1955 12/22/16 2009  NA 122* 123*  K 4.3 4.3  CL 87* 91*  CO2 17*  --   GLUCOSE 130* 131*  BUN 42* 39*  CREATININE 1.61* 1.50*  CALCIUM 9.6  --    GFR: CrCl cannot be calculated (Unknown ideal weight.). Liver Function Tests: Recent Labs  Lab 12/22/16 1955  AST 39  ALT 21  ALKPHOS 73  BILITOT 1.5*  PROT 8.9*  ALBUMIN 4.8   Urine analysis:    Component Value Date/Time   COLORURINE STRAW (A) 12/22/2016 2139   APPEARANCEUR CLEAR 12/22/2016 2139   LABSPEC 1.005 12/22/2016 2139   PHURINE 6.0 12/22/2016 2139   GLUCOSEU 50 (A) 12/22/2016 2139   HGBUR MODERATE (A) 12/22/2016 2139   BILIRUBINUR NEGATIVE 12/22/2016 2139   KETONESUR NEGATIVE 12/22/2016 2139   PROTEINUR NEGATIVE 12/22/2016 2139   UROBILINOGEN 0.2 05/25/2012 1327   NITRITE NEGATIVE 12/22/2016 2139   LEUKOCYTESUR NEGATIVE 12/22/2016 2139    Radiological Exams on Admission: Dg Chest Port 1 View  Result Date: 12/22/2016 CLINICAL DATA:  Shortness of breath and weakness. EXAM: PORTABLE CHEST 1 VIEW COMPARISON:  08/01/2015 FINDINGS: Normal heart size. No pleural effusion or edema. The lungs are hyperinflated. There are coarsened interstitial markings throughout both lungs compatible with emphysema. No superimposed airspace consolidation identified. IMPRESSION: 1. No acute findings. 2. Emphysema. Electronically Signed   By: Signa Kell M.D.   On: 12/22/2016 20:17      Assessment/Plan 61 yo female with dehydration, FTT , AKI  Principal Problem:   Failure to thrive (0-17)- chronic and long standing.  Seems to be dehydrated at this time, ivf  Active Problems:   Acute kidney injury (HCC)- cr up to 1.6, usually normal. Prerenal.  Provide ivf hydration   Dehydration- as above   COPD with  emphysema (HCC)- noted   Systolic CHF, chronic (HCC)- stable and compensated at this time   Cocaine abuse (HCC)- ck uds   Alcohol abuse- ck etoh level  Suspect her polysubstance abuse has a lot to do with her FTT     DVT prophylaxis:  scds  Code Status:  full Family Communication:  none Disposition Plan:  Per day team Consults called:  none Admission status:  observation   Yuliya Nova A MD [facility]  If 7PM-7AM, please contact night-coverage [email or web address] Password [email or web address]  12/22/2016, 11:13 PM

## 2016-12-23 ENCOUNTER — Observation Stay (HOSPITAL_COMMUNITY): Payer: Medicare Other

## 2016-12-23 ENCOUNTER — Other Ambulatory Visit: Payer: Self-pay

## 2016-12-23 DIAGNOSIS — E871 Hypo-osmolality and hyponatremia: Secondary | ICD-10-CM

## 2016-12-23 DIAGNOSIS — J439 Emphysema, unspecified: Secondary | ICD-10-CM | POA: Diagnosis present

## 2016-12-23 DIAGNOSIS — E860 Dehydration: Secondary | ICD-10-CM

## 2016-12-23 DIAGNOSIS — L8990 Pressure ulcer of unspecified site, unspecified stage: Secondary | ICD-10-CM | POA: Diagnosis present

## 2016-12-23 DIAGNOSIS — I5022 Chronic systolic (congestive) heart failure: Secondary | ICD-10-CM | POA: Diagnosis present

## 2016-12-23 DIAGNOSIS — E559 Vitamin D deficiency, unspecified: Secondary | ICD-10-CM | POA: Diagnosis present

## 2016-12-23 DIAGNOSIS — R627 Adult failure to thrive: Secondary | ICD-10-CM | POA: Diagnosis present

## 2016-12-23 DIAGNOSIS — Z681 Body mass index (BMI) 19 or less, adult: Secondary | ICD-10-CM | POA: Diagnosis not present

## 2016-12-23 DIAGNOSIS — D509 Iron deficiency anemia, unspecified: Secondary | ICD-10-CM | POA: Diagnosis present

## 2016-12-23 DIAGNOSIS — E876 Hypokalemia: Secondary | ICD-10-CM

## 2016-12-23 DIAGNOSIS — E271 Primary adrenocortical insufficiency: Secondary | ICD-10-CM | POA: Diagnosis present

## 2016-12-23 DIAGNOSIS — I10 Essential (primary) hypertension: Secondary | ICD-10-CM

## 2016-12-23 DIAGNOSIS — E43 Unspecified severe protein-calorie malnutrition: Secondary | ICD-10-CM | POA: Diagnosis present

## 2016-12-23 DIAGNOSIS — Z79899 Other long term (current) drug therapy: Secondary | ICD-10-CM | POA: Diagnosis not present

## 2016-12-23 DIAGNOSIS — F1910 Other psychoactive substance abuse, uncomplicated: Secondary | ICD-10-CM | POA: Diagnosis present

## 2016-12-23 DIAGNOSIS — F1010 Alcohol abuse, uncomplicated: Secondary | ICD-10-CM | POA: Diagnosis present

## 2016-12-23 DIAGNOSIS — Z720 Tobacco use: Secondary | ICD-10-CM | POA: Diagnosis not present

## 2016-12-23 DIAGNOSIS — F17210 Nicotine dependence, cigarettes, uncomplicated: Secondary | ICD-10-CM | POA: Diagnosis present

## 2016-12-23 DIAGNOSIS — I110 Hypertensive heart disease with heart failure: Secondary | ICD-10-CM | POA: Diagnosis present

## 2016-12-23 DIAGNOSIS — N179 Acute kidney failure, unspecified: Secondary | ICD-10-CM | POA: Diagnosis present

## 2016-12-23 DIAGNOSIS — F1410 Cocaine abuse, uncomplicated: Secondary | ICD-10-CM | POA: Diagnosis present

## 2016-12-23 DIAGNOSIS — I252 Old myocardial infarction: Secondary | ICD-10-CM | POA: Diagnosis not present

## 2016-12-23 DIAGNOSIS — R05 Cough: Secondary | ICD-10-CM | POA: Diagnosis not present

## 2016-12-23 DIAGNOSIS — I959 Hypotension, unspecified: Secondary | ICD-10-CM | POA: Diagnosis present

## 2016-12-23 LAB — BASIC METABOLIC PANEL
Anion gap: 11 (ref 5–15)
BUN: 27 mg/dL — ABNORMAL HIGH (ref 6–20)
CALCIUM: 8.7 mg/dL — AB (ref 8.9–10.3)
CO2: 19 mmol/L — ABNORMAL LOW (ref 22–32)
CREATININE: 0.97 mg/dL (ref 0.44–1.00)
Chloride: 98 mmol/L — ABNORMAL LOW (ref 101–111)
Glucose, Bld: 116 mg/dL — ABNORMAL HIGH (ref 65–99)
Potassium: 2.8 mmol/L — ABNORMAL LOW (ref 3.5–5.1)
Sodium: 128 mmol/L — ABNORMAL LOW (ref 135–145)

## 2016-12-23 LAB — MAGNESIUM: MAGNESIUM: 1.6 mg/dL — AB (ref 1.7–2.4)

## 2016-12-23 LAB — CBC
HCT: 32.2 % — ABNORMAL LOW (ref 36.0–46.0)
Hemoglobin: 12 g/dL (ref 12.0–15.0)
MCH: 28 pg (ref 26.0–34.0)
MCHC: 37.3 g/dL — ABNORMAL HIGH (ref 30.0–36.0)
MCV: 75.2 fL — ABNORMAL LOW (ref 78.0–100.0)
PLATELETS: 125 10*3/uL — AB (ref 150–400)
RBC: 4.28 MIL/uL (ref 3.87–5.11)
RDW: 12.9 % (ref 11.5–15.5)
WBC: 5.4 10*3/uL (ref 4.0–10.5)

## 2016-12-23 LAB — HIV ANTIBODY (ROUTINE TESTING W REFLEX): HIV SCREEN 4TH GENERATION: NONREACTIVE

## 2016-12-23 LAB — LACTIC ACID, PLASMA
LACTIC ACID, VENOUS: 2.4 mmol/L — AB (ref 0.5–1.9)
LACTIC ACID, VENOUS: 2.6 mmol/L — AB (ref 0.5–1.9)

## 2016-12-23 MED ORDER — HYDROCODONE-HOMATROPINE 5-1.5 MG/5ML PO SYRP
5.0000 mL | ORAL_SOLUTION | Freq: Four times a day (QID) | ORAL | Status: DC | PRN
  Administered 2016-12-23 – 2016-12-30 (×7): 5 mL via ORAL
  Filled 2016-12-23 (×7): qty 5

## 2016-12-23 MED ORDER — ENSURE ENLIVE PO LIQD
237.0000 mL | Freq: Three times a day (TID) | ORAL | Status: DC
  Administered 2016-12-23 – 2017-01-01 (×23): 237 mL via ORAL

## 2016-12-23 MED ORDER — POTASSIUM CHLORIDE 10 MEQ/100ML IV SOLN
10.0000 meq | INTRAVENOUS | Status: AC
  Administered 2016-12-23 (×4): 10 meq via INTRAVENOUS
  Filled 2016-12-23 (×4): qty 100

## 2016-12-23 MED ORDER — TIOTROPIUM BROMIDE MONOHYDRATE 18 MCG IN CAPS
18.0000 ug | ORAL_CAPSULE | Freq: Every day | RESPIRATORY_TRACT | Status: DC
  Administered 2016-12-23 – 2017-01-01 (×6): 18 ug via RESPIRATORY_TRACT
  Filled 2016-12-23 (×2): qty 5

## 2016-12-23 MED ORDER — MAGNESIUM SULFATE 4 GM/100ML IV SOLN
4.0000 g | Freq: Once | INTRAVENOUS | Status: AC
  Administered 2016-12-23: 4 g via INTRAVENOUS
  Filled 2016-12-23: qty 100

## 2016-12-23 MED ORDER — GUAIFENESIN-DM 100-10 MG/5ML PO SYRP
5.0000 mL | ORAL_SOLUTION | ORAL | Status: DC | PRN
  Administered 2016-12-23: 5 mL via ORAL
  Filled 2016-12-23: qty 10

## 2016-12-23 MED ORDER — ENSURE ENLIVE PO LIQD
237.0000 mL | Freq: Two times a day (BID) | ORAL | Status: DC
  Administered 2016-12-23: 237 mL via ORAL

## 2016-12-23 MED ORDER — POTASSIUM CHLORIDE CRYS ER 20 MEQ PO TBCR
40.0000 meq | EXTENDED_RELEASE_TABLET | ORAL | Status: AC
  Administered 2016-12-23 (×2): 40 meq via ORAL
  Filled 2016-12-23 (×2): qty 2

## 2016-12-23 MED ORDER — ENOXAPARIN SODIUM 30 MG/0.3ML ~~LOC~~ SOLN
30.0000 mg | SUBCUTANEOUS | Status: DC
  Administered 2016-12-23: 30 mg via SUBCUTANEOUS
  Filled 2016-12-23: qty 0.3

## 2016-12-23 MED ORDER — ADULT MULTIVITAMIN W/MINERALS CH
1.0000 | ORAL_TABLET | Freq: Every day | ORAL | Status: DC
  Administered 2016-12-23 – 2017-01-01 (×10): 1 via ORAL
  Filled 2016-12-23 (×10): qty 1

## 2016-12-23 MED ORDER — SODIUM CHLORIDE 0.9 % IV SOLN
INTRAVENOUS | Status: DC
  Administered 2016-12-23 – 2016-12-25 (×4): via INTRAVENOUS

## 2016-12-23 MED ORDER — MOMETASONE FURO-FORMOTEROL FUM 100-5 MCG/ACT IN AERO
2.0000 | INHALATION_SPRAY | Freq: Two times a day (BID) | RESPIRATORY_TRACT | Status: DC
  Administered 2016-12-24 – 2017-01-01 (×17): 2 via RESPIRATORY_TRACT
  Filled 2016-12-23: qty 8.8

## 2016-12-23 NOTE — Progress Notes (Signed)
Paged Hospitalist critical lactic of 2.6 as well as potassium of 2.8

## 2016-12-23 NOTE — Progress Notes (Signed)
Initial Nutrition Assessment  DOCUMENTATION CODES:   Severe malnutrition in context of social or environmental circumstances  INTERVENTION:    Ensure Enlive po TID, each supplement provides 350 kcal and 20 grams of protein  Provide MVI daily  **Recommend checking phosphorous level.  Pt is at high risk for refeeding. Monitor and supplement electrolytes as needed per MD descretion.**  NUTRITION DIAGNOSIS:   Severe Malnutrition related to social / environmental circumstances as evidenced by severe fat depletion, severe muscle depletion.  GOAL:   Patient will meet greater than or equal to 90% of their needs  MONITOR:   PO intake, Supplement acceptance, Labs, Weight trends  REASON FOR ASSESSMENT:   Malnutrition Screening Tool    ASSESSMENT:   Pt with PMH significant for polysubstance abuse, FTT, and COPD. Presents this admission after family noticed pt was not eating and drinking very well. Admitted for chronic and long standing FTT and dehydration.   Spoke with pt at bedside. Pt seemed confused upon interview. No family at bedside to obtain further nutrition history.  Pt reports having a loss in appetite for "a long time." When asked how many meals per day she consumes, she reports "none." Pt observed to be drinking Ensure Enlive during assessment and states she enjoys them. Will increase from BID to TID. Will add MVI given polysubstance abuse history.  Records limited in weight history, but show pt has stayed under 80 lb for >5 years.  Nutrition-Focused physical exam completed. See findings below.   Medications reviewed and include: KCl Labs reviewed: Na 128 (L) K 2.8 (L) ionized calcium 1.01 (L) Mg 1.6 (L)   NUTRITION - FOCUSED PHYSICAL EXAM:    Most Recent Value  Orbital Region  Severe depletion  Upper Arm Region  Severe depletion  Thoracic and Lumbar Region  Unable to assess  Buccal Region  Severe depletion  Temple Region  Severe depletion  Clavicle Bone Region   Severe depletion  Clavicle and Acromion Bone Region  Severe depletion  Scapular Bone Region  Unable to assess  Dorsal Hand  Severe depletion  Patellar Region  Severe depletion  Anterior Thigh Region  Severe depletion  Posterior Calf Region  Severe depletion  Edema (RD Assessment)  None  Hair  Reviewed  Eyes  Reviewed  Mouth  Reviewed [Dentures falling out]  Skin  Reviewed  Nails  Reviewed       Diet Order:  Diet Heart Room service appropriate? Yes; Fluid consistency: Thin  EDUCATION NEEDS:   Not appropriate for education at this time  Skin:  Skin Assessment: Reviewed RN Assessment  Last BM:  PTA  Height:   Ht Readings from Last 1 Encounters:  12/22/16 5\' 6"  (1.676 m)    Weight:   Wt Readings from Last 1 Encounters:  12/22/16 71 lb 10.4 oz (32.5 kg)    Ideal Body Weight:  59.1 kg  BMI:  Body mass index is 11.56 kg/m.  Estimated Nutritional Needs:   Kcal:  1700-1900 kcal/day  Protein:  85-95 g/day  Fluid:  >1.7 L/day    12/24/16 RD, LDN Clinical Nutrition Pager # - [phone]

## 2016-12-23 NOTE — Progress Notes (Signed)
Paged to inform Dr. Onalee Hua of BP 153/129. No orders obtained at this time. Patient is asymptomatic and stable.

## 2016-12-23 NOTE — Progress Notes (Signed)
PROGRESS NOTE    Jennifer Hurley  FFM:[id number] DOB: 01-22-1956 DOA: 12/22/2016 PCP: Rometta Emery, MD    Brief Narrative:  Jennifer Hurley is a 61 y.o. female with medical history significant of etoh and polysubstance abuse, FTT, copd comes in after family found her at home not eating and drinking very well.  Pt denies any pain anywhere.  She denies any n/v/d although this was reported earlier to ED staff.  She denies any fevers.  Pt found to have hyponatremia and aki and dehydration and referred for admission for such.  She was initially with low bp and then with high bp in ED.  Her ROS is grossly negative.     Assessment & Plan:   Principal Problem:   Failure to thrive (0-17) Active Problems:   Essential hypertension   COPD with emphysema (HCC)   Hypokalemia   Systolic CHF, chronic (HCC)   Severe protein-calorie malnutrition (HCC)   Tobacco abuse   Cocaine abuse (HCC)   Alcohol abuse   Acute kidney injury (HCC)   Dehydration   AKI (acute kidney injury) (HCC)   Hypomagnesemia  #1 failure to thrive Questionable etiology.  Noted to be chronic and long-standing.  May be secondary to history of polysubstance abuse.  Patient with no signs or symptoms of infection.  Urinalysis unremarkable.  Chest x-ray negative.  Blood cultures with no growth to date.  Patient is afebrile.  Normal white count.  Hemoglobin was elevated on admission likely secondary to significant dehydration and has trended down.  Patient with no overt bleeding.  Check a TSH.  Nutrition has been consulted.  Will likely need nutritional supplementation.  IV fluids.  Supportive care.  2.  Acute kidney injury Likely secondary to a prerenal azotemia as patient was noted to be hypotensive on admission with systolic blood pressures in the 70s.  Urinalysis negative nitrite, negative leukocytes, negative proteins.  Renal function improved with hydration.  Follow.  3.  Cough Questionable etiology.  Repeat chest x-ray  as patient has been hydrated to rule out a pneumonia.  Speech evaluation.  4.  Severe dehydration Likely secondary to problem #1.  Continue IV fluids.  Supportive care.  5.  Hypokalemia/hypomagnesemia Replete.  6.  Hyponatremia Patient at present with a significant hyponatremia noted to be hypotensive on admission.  Likely secondary to hypovolemic hyponatremia.  Sodium levels improving with hydration.  Check a TSH.  Check a random cortisol level.  Continue hydration with IV fluids.  Follow.  7.  Severe protein calorie malnutrition Nutrition following.  Check a phosphorus level.  Will likely need nutritional supplementation.  8.  COPD Stable.  Place on Symbicort and Spiriva.  Nebs as needed.  9.  Alcohol abuse Currently stable.  Alcohol level less than 5.  Follow.  10.  Polysubstance abuse UDS negative.     DVT prophylaxis: Lovenox Code Status: Full Family Communication: Updated patient.  No family at bedside. Disposition Plan: Likely home once hyponatremia has improved, electrolytes repleted, clinical improvement.   Consultants:   None  Procedures:   Chest x-ray 12/22/2016, 12/23/2016  Antimicrobials:   None   Subjective: Patient coughing incessantly.  Patient states feels a little bit better than on admission.  Denies shortness of breath.  Denies chest pain.  Denies any difficulty swallowing.  Objective: Vitals:   12/22/16 2200 12/22/16 2300 12/22/16 2348 12/23/16 0553  BP: (!) 138/107 (!) 145/104 (!) 153/129 (!) 132/91  Pulse: (!) 104 64 (!) 103 90  Resp: 16  13 18 18   Temp:   97.8 F (36.6 C) 98.4 F (36.9 C)  TempSrc:   Oral Oral  SpO2: 98% 100%  100%  Weight:   32.5 kg (71 lb 10.4 oz)   Height:   5\' 6"  (1.676 m)     Intake/Output Summary (Last 24 hours) at 12/23/2016 1157 Last data filed at 12/23/2016 0853 Gross per 24 hour  Intake 4990 ml  Output 300 ml  Net 4690 ml   Filed Weights   12/22/16 2348  Weight: 32.5 kg (71 lb 10.4 oz)     Examination:  General exam: Coughing.  Frail.  Cachectic. Respiratory system: Clear to auscultation.  Poor air movement respiratory effort normal. Cardiovascular system: S1 & S2 heard, RRR. No JVD, murmurs, rubs, gallops or clicks. No pedal edema. Gastrointestinal system: Abdomen is nondistended, soft and nontender. No organomegaly or masses felt. Normal bowel sounds heard. Central nervous system: Alert and oriented. No focal neurological deficits. Extremities: Symmetric 5 x 5 power. Skin: No rashes, lesions or ulcers Psychiatry: Judgement and insight appear fair. Mood & affect appropriate.     Data Reviewed: I have personally reviewed following labs and imaging studies  CBC: Recent Labs  Lab 12/22/16 1955 12/22/16 2009 12/23/16 0430  WBC 6.2  --  5.4  NEUTROABS 3.8  --   --   HGB 16.2* 17.0* 12.0  HCT 42.0 50.0* 32.2*  MCV 75.5*  --  75.2*  PLT 165  --  125*   Basic Metabolic Panel: Recent Labs  Lab 12/22/16 1955 12/22/16 2009 12/23/16 0430  NA 122* 123* 128*  K 4.3 4.3 2.8*  CL 87* 91* 98*  CO2 17*  --  19*  GLUCOSE 130* 131* 116*  BUN 42* 39* 27*  CREATININE 1.61* 1.50* 0.97  CALCIUM 9.6  --  8.7*  MG  --   --  1.6*   GFR: Estimated Creatinine Clearance: 32 mL/min (by C-G formula based on SCr of 0.97 mg/dL). Liver Function Tests: Recent Labs  Lab 12/22/16 1955  AST 39  ALT 21  ALKPHOS 73  BILITOT 1.5*  PROT 8.9*  ALBUMIN 4.8   No results for input(s): LIPASE, AMYLASE in the last 168 hours. No results for input(s): AMMONIA in the last 168 hours. Coagulation Profile: No results for input(s): INR, PROTIME in the last 168 hours. Cardiac Enzymes: No results for input(s): CKTOTAL, CKMB, CKMBINDEX, TROPONINI in the last 168 hours. BNP (last 3 results) No results for input(s): PROBNP in the last 8760 hours. HbA1C: No results for input(s): HGBA1C in the last 72 hours. CBG: No results for input(s): GLUCAP in the last 168 hours. Lipid Profile: No  results for input(s): CHOL, HDL, LDLCALC, TRIG, CHOLHDL, LDLDIRECT in the last 72 hours. Thyroid Function Tests: No results for input(s): TSH, T4TOTAL, FREET4, T3FREE, THYROIDAB in the last 72 hours. Anemia Panel: No results for input(s): VITAMINB12, FOLATE, FERRITIN, TIBC, IRON, RETICCTPCT in the last 72 hours. Sepsis Labs: Recent Labs  Lab 12/22/16 2009 12/22/16 2203 12/23/16 0020 12/23/16 0443  LATICACIDVEN 3.60* 2.19* 2.4* 2.6*    No results found for this or any previous visit (from the past 240 hour(s)).       Radiology Studies: Dg Chest Port 1 View  Result Date: 12/22/2016 CLINICAL DATA:  Shortness of breath and weakness. EXAM: PORTABLE CHEST 1 VIEW COMPARISON:  08/01/2015 FINDINGS: Normal heart size. No pleural effusion or edema. The lungs are hyperinflated. There are coarsened interstitial markings throughout both lungs compatible with emphysema. No  superimposed airspace consolidation identified. IMPRESSION: 1. No acute findings. 2. Emphysema. Electronically Signed   By: Signa Kell M.D.   On: 12/22/2016 20:17        Scheduled Meds: . feeding supplement (ENSURE ENLIVE)  237 mL Oral TID BM  . multivitamin with minerals  1 tablet Oral Daily  . potassium chloride  40 mEq Oral Q4H   Continuous Infusions: . magnesium sulfate 1 - 4 g bolus IVPB       LOS: 0 days    Time spent: 35 minutes    THOMPSON,DANIEL, MD [facility] Pager [phone]  If 7PM-7AM, please contact night-coverage [email or web address] Password [email or web address] 12/23/2016, 11:57 AM

## 2016-12-23 NOTE — Evaluation (Signed)
Clinical/Bedside Swallow Evaluation Patient Details  Name: Jennifer Hurley MRN: [id number] Date of Birth: October 27, 1955  Today's Date: 12/23/2016 Time: SLP Start Time (ACUTE ONLY): 1441 SLP Stop Time (ACUTE ONLY): 1448 SLP Time Calculation (min) (ACUTE ONLY): 7 min  Past Medical History:  Past Medical History:  Diagnosis Date  . Alcohol abuse   . Bronchitis   . Chronic respiratory failure (HCC)   . Cocaine abuse (HCC)   . COPD (chronic obstructive pulmonary disease) (HCC)   . Hyponatremia   . Myocardial infarction (HCC)   . Pneumonia   . Rheumatoid arthritis(714.0)   . Tobacco abuse    Past Surgical History:  Past Surgical History:  Procedure Laterality Date  . TONSILLECTOMY    . TUBAL LIGATION     HPI:  Ptis a 61 y.o.femalewith medical history significant ofetoh and polysubstance abuse, FTT, COPD, MI, and PNA, who comes in after family found her at home with AMS, not eating and drinking very well.    Assessment / Plan / Recommendation Clinical Impression   Pt consumed thin liquids by straw with frequent eructation but no overt s/s of aspiration observed. She declined all solids offered, saying that she wasn't ready to eat yet. Only small amounts of her lunch tray were touched. Suspect that her oropharyngeal function is intact based upon limited assessment, but will f/u briefly in an attempt to see her with more solid foods.  SLP Visit Diagnosis: Dysphagia, unspecified (R13.10)    Aspiration Risk  Mild aspiration risk    Diet Recommendation Regular;Thin liquid   Liquid Administration via: Cup;Straw Medication Administration: Whole meds with liquid Supervision: Patient able to self feed Compensations: Slow rate;Small sips/bites Postural Changes: Seated upright at 90 degrees;Remain upright for at least 30 minutes after po intake    Other  Recommendations Oral Care Recommendations: Oral care BID   Follow up Recommendations None      Frequency and Duration min 1  x/week  1 week       Prognosis        Swallow Study   General HPI: Ptis a 61 y.o.femalewith medical history significant ofetoh and polysubstance abuse, FTT, COPD, MI, and PNA, who comes in after family found her at home with AMS, not eating and drinking very well.  Type of Study: Bedside Swallow Evaluation Previous Swallow Assessment: none in chart Diet Prior to this Study: Regular;Thin liquids Temperature Spikes Noted: No Respiratory Status: Room air History of Recent Intubation: No Behavior/Cognition: Alert Oral Cavity Assessment: Within Functional Limits Oral Care Completed by SLP: No Oral Cavity - Dentition: Missing dentition;Other (Comment)(pt has some lower dentition, did not want to put dentures in) Vision: Functional for self-feeding Self-Feeding Abilities: Able to feed self Patient Positioning: Upright in bed Baseline Vocal Quality: Normal    Oral/Motor/Sensory Function Overall Oral Motor/Sensory Function: Within functional limits   Ice Chips Ice chips: Not tested   Thin Liquid Thin Liquid: Within functional limits Presentation: Self Fed;Straw    Nectar Thick Nectar Thick Liquid: Not tested   Honey Thick Honey Thick Liquid: Not tested   Puree Puree: Not tested   Solid   GO   Solid: Not tested    Functional Assessment Tool Used: skilled clinical judgment Functional Limitations: Swallowing Swallow Current Status (N8295): At least 1 percent but less than 20 percent impaired, limited or restricted Swallow Goal Status [phone]): At least 1 percent but less than 20 percent impaired, limited or restricted   Maxcine Ham 12/23/2016,3:10 PM  Maxcine Ham, M.A. CCC-SLP (  [phone]    

## 2016-12-23 NOTE — Evaluation (Signed)
Speech Language Pathology Evaluation Patient Details Name: Jennifer Hurley MRN: [id number] DOB: [birth year] Today's Date: 12/23/2016 Time: 3893-7342 SLP Time Calculation (min) (ACUTE ONLY): 7 min  Problem List:  Patient Active Problem List   Diagnosis Date Noted  . Hypomagnesemia 12/23/2016  . FTT (failure to thrive) in adult 12/22/2016  . Weight loss   . UTI (lower urinary tract infection) 05/10/2015  . Failure to thrive (0-17) 05/10/2015  . Abdominal pain 05/10/2015  . Failure to thrive in adult 05/10/2015  . Acute kidney injury (HCC) 05/10/2015  . UTI (urinary tract infection) 05/10/2015  . Dehydration   . AKI (acute kidney injury) (HCC)   . Anemia of other chronic disease 06/29/2012  . HCAP (healthcare-associated pneumonia) 05/02/2012  . Tracheostomy status (HCC) 04/26/2012  . PSVT (paroxysmal supraventricular tachycardia) (HCC) 04/18/2012  . Agitation 04/18/2012  . Delirium 04/18/2012  . Acute respiratory failure (HCC) 04/13/2012  . COPD exacerbation (HCC) 04/13/2012  . Severe protein-calorie malnutrition (HCC) 04/12/2012  . Tobacco abuse   . Cocaine abuse (HCC)   . Alcohol abuse   . Persistent pneumonia 02/15/2012  . Chronic respiratory failure with hypoxia (HCC) 02/15/2012  . Trichomoniasis 02/13/2012  . Chloride, decreased level 02/12/2012  . Malnutrition (HCC) 02/12/2012  . Acute respiratory failure with hypoxia (HCC) 10/29/2011  . Leukocytosis 10/28/2011  . Hyponatremia 10/28/2011  . Hypokalemia 10/28/2011  . Systolic CHF, chronic (HCC) 10/28/2011  . CAP (community acquired pneumonia) 10/28/2011  . SUBSTANCE ABUSE, MULTIPLE 01/19/2008  . Essential hypertension 06/29/2007  . CANDIDIASIS, MOUTH (THRUSH) 07/26/2006  . COPD with emphysema (HCC) 07/26/2006   Past Medical History:  Past Medical History:  Diagnosis Date  . Alcohol abuse   . Bronchitis   . Chronic respiratory failure (HCC)   . Cocaine abuse (HCC)   . COPD (chronic obstructive pulmonary  disease) (HCC)   . Hyponatremia   . Myocardial infarction (HCC)   . Pneumonia   . Rheumatoid arthritis(714.0)   . Tobacco abuse    Past Surgical History:  Past Surgical History:  Procedure Laterality Date  . TONSILLECTOMY    . TUBAL LIGATION     HPI:  Ptis a 61 y.o.femalewith medical history significant ofetoh and polysubstance abuse, FTT, COPD, MI, and PNA, who comes in after family found her at home with AMS, not eating and drinking very well.    Assessment / Plan / Recommendation Clinical Impression  Pt is oriented x2 (did not know the year or why she was in the hospital) and exhibits reduced sustained attention and storage of new information. I do however question her effort level on tasks assessed, as she often did not want to participate. It is unclear what her baseline level of function is, although she does say that she functions independently at home. SLP will f/u briefly to monitor her mentation as is potentially clears with current interventions from medical team and determine if further SLP f/u is indicated.    SLP Assessment  SLP Recommendation/Assessment: Patient needs continued Speech Lanaguage Pathology Services SLP Visit Diagnosis: Cognitive communication deficit (R41.841)    Follow Up Recommendations  24 hour supervision/assistance    Frequency and Duration min 2x/week  1 week      SLP Evaluation Cognition  Overall Cognitive Status: No family/caregiver present to determine baseline cognitive functioning Arousal/Alertness: Awake/alert Orientation Level: Oriented to person;Oriented to place;Disoriented to time;Disoriented to situation Attention: Sustained Sustained Attention: Impaired Sustained Attention Impairment: Verbal basic Memory: Impaired Memory Impairment: Storage deficit Awareness: Impaired Awareness Impairment: Intellectual impairment  Comprehension  Auditory Comprehension Overall Auditory Comprehension: Appears within functional  limits for tasks assessed    Expression Expression Primary Mode of Expression: Verbal Verbal Expression Overall Verbal Expression: Appears within functional limits for tasks assessed   Oral / Motor  Oral Motor/Sensory Function Overall Oral Motor/Sensory Function: Within functional limits Motor Speech Overall Motor Speech: Appears within functional limits for tasks assessed   GO          Functional Assessment Tool Used: skilled clinical judgment Functional Limitations: Attention Swallow Current Status (V8592): At least 1 percent but less than 20 percent impaired, limited or restricted Swallow Goal Status [phone]): At least 1 percent but less than 20 percent impaired, limited or restricted Attention Current Status (K8638): At least 40 percent but less than 60 percent impaired, limited or restricted Attention Goal Status (T7711): At least 20 percent but less than 40 percent impaired, limited or restricted         Maxcine Ham 12/23/2016, 3:16 PM  Maxcine Ham, M.A. CCC-SLP [phone]

## 2016-12-24 DIAGNOSIS — R059 Cough, unspecified: Secondary | ICD-10-CM

## 2016-12-24 DIAGNOSIS — L8990 Pressure ulcer of unspecified site, unspecified stage: Secondary | ICD-10-CM

## 2016-12-24 DIAGNOSIS — R05 Cough: Secondary | ICD-10-CM

## 2016-12-24 LAB — BASIC METABOLIC PANEL
Anion gap: 7 (ref 5–15)
BUN: 11 mg/dL (ref 6–20)
CALCIUM: 8.1 mg/dL — AB (ref 8.9–10.3)
CHLORIDE: 99 mmol/L — AB (ref 101–111)
CO2: 23 mmol/L (ref 22–32)
Creatinine, Ser: 0.67 mg/dL (ref 0.44–1.00)
GFR calc Af Amer: >60 mL/min (ref >60)
GFR calc non Af Amer: >60 mL/min (ref >60)
GLUCOSE: 86 mg/dL (ref 65–99)
Potassium: 3.6 mmol/L (ref 3.5–5.1)
Sodium: 129 mmol/L — ABNORMAL LOW (ref 135–145)

## 2016-12-24 LAB — CBC
HEMATOCRIT: 26.8 % — AB (ref 36.0–46.0)
HEMOGLOBIN: 9.8 g/dL — AB (ref 12.0–15.0)
MCH: 28.2 pg (ref 26.0–34.0)
MCHC: 36.6 g/dL — AB (ref 30.0–36.0)
MCV: 77 fL — AB (ref 78.0–100.0)
Platelets: 107 10*3/uL — ABNORMAL LOW (ref 150–400)
RBC: 3.48 MIL/uL — ABNORMAL LOW (ref 3.87–5.11)
RDW: 13.3 % (ref 11.5–15.5)
WBC: 4 10*3/uL (ref 4.0–10.5)

## 2016-12-24 LAB — TSH: TSH: 1.506 u[IU]/mL (ref 0.350–4.500)

## 2016-12-24 LAB — PHOSPHORUS: PHOSPHORUS: 1.1 mg/dL — AB (ref 2.5–4.6)

## 2016-12-24 LAB — URINE CULTURE: Culture: NO GROWTH

## 2016-12-24 LAB — CORTISOL: Cortisol, Plasma: 7.7 ug/dL

## 2016-12-24 LAB — LACTIC ACID, PLASMA: Lactic Acid, Venous: 1.3 mmol/L (ref 0.5–1.9)

## 2016-12-24 LAB — MAGNESIUM: Magnesium: 2.2 mg/dL (ref 1.7–2.4)

## 2016-12-24 MED ORDER — POTASSIUM PHOSPHATES 15 MMOLE/5ML IV SOLN
20.0000 mmol | Freq: Once | INTRAVENOUS | Status: AC
  Administered 2016-12-24: 20 mmol via INTRAVENOUS
  Filled 2016-12-24: qty 6.67

## 2016-12-24 MED ORDER — POTASSIUM CHLORIDE 20 MEQ/15ML (10%) PO SOLN
40.0000 meq | Freq: Once | ORAL | Status: AC
  Administered 2016-12-24: 40 meq via ORAL
  Filled 2016-12-24 (×2): qty 30

## 2016-12-24 MED ORDER — ENOXAPARIN SODIUM 30 MG/0.3ML ~~LOC~~ SOLN
20.0000 mg | SUBCUTANEOUS | Status: DC
  Administered 2016-12-24 – 2016-12-31 (×7): 20 mg via SUBCUTANEOUS
  Filled 2016-12-24: qty 0.2
  Filled 2016-12-24 (×2): qty 0.3
  Filled 2016-12-24 (×3): qty 0.2
  Filled 2016-12-24: qty 0.3
  Filled 2016-12-24 (×3): qty 0.2
  Filled 2016-12-24: qty 0.3
  Filled 2016-12-24 (×2): qty 0.2
  Filled 2016-12-24 (×3): qty 0.3

## 2016-12-24 MED ORDER — POTASSIUM PHOSPHATES 15 MMOLE/5ML IV SOLN
20.0000 mmol | Freq: Once | INTRAVENOUS | Status: AC
Start: 2016-12-24 — End: 2016-12-25
  Administered 2016-12-24: 20 mmol via INTRAVENOUS
  Filled 2016-12-24: qty 6.67

## 2016-12-24 NOTE — Clinical Social Work Note (Signed)
Clinical Social Work Assessment  Patient Details  Name: Jennifer Hurley MRN: [id number] Date of Birth: [birth year]  Date of referral:  12/24/16               Reason for consult:  Facility Placement                Permission sought to share information with:    Permission granted to share information::     Name::        Agency::     Relationship::     Contact Information:     Housing/Transportation Living arrangements for the past 2 months:  Apartment Source of Information:  Patient Patient Interpreter Needed:  None Criminal Activity/Legal Involvement Pertinent to Current Situation/Hospitalization:    Significant Relationships:    Lives with:  Self Do you feel safe going back to the place where you live?  Yes Need for family participation in patient care:  Yes (Comment)  Care giving concerns: History of ETOH and polysubstance abuse, FTT, copd comes in after family found her at home not eating and drinking very well. Consulted for SNF placement.    Social Worker assessment / plan:  CSW spoke with patient via bedside regarding discharge plans. PT has recommended SNF placement once stable for discharge- patient is agreeable to this plan. Patient currently lives alone in an apartment and does not require much assistance while at home. Patient did state she would like to return home however is open to recommendations. Patient does not have any preference for SNF placement however [facility] is close to her current address.   PLAN: CSW will complete FL2/ pasrr and seek bed offers. CSW will follow up with patient once offers have been made.   Employment status:  Disabled (Comment on whether or not currently receiving Disability) Insurance information:  Medicare, Medicaid In [place] PT Recommendations:  Skilled Nursing Facility Information / Referral to community resources:  Skilled Nursing Facility  Patient/Family's Response to care:  Patient was pleasant and appreciated CSW.    Patient/Family's Understanding of and Emotional Response to Diagnosis, Current Treatment, and Prognosis: Understands currrent prognosis and treatment.   Emotional Assessment Appearance:  Appears stated age Attitude/Demeanor/Rapport:    Affect (typically observed):  Accepting, Pleasant, Appropriate Orientation:  Oriented to Self, Oriented to Place, Oriented to  Time, Oriented to Situation Alcohol / Substance use:    Psych involvement (Current and /or in the community):  No (Comment)  Discharge Needs  Concerns to be addressed:  No discharge needs identified Readmission within the last 30 days:  No Current discharge risk:  None Barriers to Discharge:  No Barriers Identified   [name], LCSW 12/24/2016, 2:32 PM

## 2016-12-24 NOTE — Care Management Note (Signed)
Case Management Note  Patient Details  Name: Jennifer Hurley MRN: [id number] Date of Birth: Sep 18, 1955  Subjective/Objective:61 y/o f admitted w/FTT. From home. PT cons-recc SNF-CSW following.                    Action/Plan:d/c plan SNF.   Expected Discharge Date:  (unknown)               Expected Discharge Plan:  Skilled Nursing Facility  In-House Referral:     Discharge planning Services  CM Consult  Post Acute Care Choice:    Choice offered to:     DME Arranged:    DME Agency:     HH Arranged:    HH Agency:     Status of Service:  In process, will continue to follow  If discussed at Long Length of Stay Meetings, dates discussed:    Additional Comments:  Lanier Clam, RN 12/24/2016, 12:20 PM

## 2016-12-24 NOTE — Progress Notes (Signed)
MEDICATION RELATED CONSULT NOTE    Pharmacy Consult for Potassium phosphate Indication: Low phosphorus  No Known Allergies  Patient Measurements: Height: 5\' 6"  (167.6 cm) Weight: 71 lb 10.4 oz (32.5 kg) IBW/kg (Calculated) : 59.3 Used 32.5 kg  Vital Signs: Temp: 97.4 F (36.3 C) (11/15 1253) Temp Source: Oral (11/15 1253) BP: 99/70 (11/15 1253) Pulse Rate: 105 (11/15 1253) Intake/Output from previous day: 11/14 0701 - 11/15 0700 In: 2594.2 [P.O.:720; I.V.:1367.5; IV Piggyback:506.7] Out: 1025 [Urine:1025] Intake/Output from this shift: No intake/output data recorded.  Labs: Recent Labs    12/22/16 1955 12/22/16 2009 12/23/16 0430 12/24/16 0432 12/24/16 1942  WBC 6.2  --  5.4 4.0  --   HGB 16.2* 17.0* 12.0 9.8*  --   HCT 42.0 50.0* 32.2* 26.8*  --   PLT 165  --  125* 107*  --   CREATININE 1.61* 1.50* 0.97 0.67  --   MG  --   --  1.6* 2.2  --   PHOS  --   --   --  <1.0* 1.1*  ALBUMIN 4.8  --   --   --   --   PROT 8.9*  --   --   --   --   AST 39  --   --   --   --   ALT 21  --   --   --   --   ALKPHOS 73  --   --   --   --   BILITOT 1.5*  --   --   --   --    Estimated Creatinine Clearance: 38.8 mL/min (by C-G formula based on SCr of 0.67 mg/dL).   Microbiology: Recent Results (from the past 720 hour(s))  Blood Culture (routine x 2)     Status: None (Preliminary result)   Collection Time: 12/22/16  7:55 PM  Result Value Ref Range Status   Specimen Description BLOOD RIGHT FOREARM  Final   Special Requests IN PEDIATRIC BOTTLE Blood Culture adequate volume  Final   Culture   Final    NO GROWTH 2 DAYS Performed at [facility], [address] [place], [place]    Report Status PENDING  Incomplete  Blood Culture (routine x 2)     Status: None (Preliminary result)   Collection Time: 12/22/16  7:57 PM  Result Value Ref Range Status   Specimen Description BLOOD LEFT FOREARM  Final   Special Requests IN PEDIATRIC BOTTLE Blood Culture adequate  volume  Final   Culture   Final    NO GROWTH 2 DAYS Performed at [facility], [address], [place], [place]    Report Status PENDING  Incomplete  Urine culture     Status: None   Collection Time: 12/22/16  9:39 PM  Result Value Ref Range Status   Specimen Description URINE, CATHETERIZED  Final   Special Requests NONE  Final   Culture   Final    NO GROWTH Performed at [facility], [address] [place], [place]    Report Status 12/24/2016 FINAL  Final    Medications:  Scheduled:  . enoxaparin (LOVENOX) injection  20 mg Subcutaneous Q24H  . feeding supplement (ENSURE ENLIVE)  237 mL Oral TID BM  . mometasone-formoterol  2 puff Inhalation BID  . multivitamin with minerals  1 tablet Oral Daily  . tiotropium  18 mcg Inhalation Daily   Infusions:  . sodium chloride  75 mL/hr at 12/24/16 1908    Assessment: Phos=1.1, low after  Kphos  X 1   Plan:  Give 20 mmol (~0.6 mmol/kg) Kphos x1 now over 6 hours Check phos with am labs  Arley Phenix RPh 12/24/2016, 8:36 PM Pager [phone]

## 2016-12-24 NOTE — Progress Notes (Signed)
MEDICATION RELATED CONSULT NOTE    Pharmacy Consult for Potassium phosphate Indication: Low phosphorus  No Known Allergies  Patient Measurements: Height: 5\' 6"  (167.6 cm) Weight: 71 lb 10.4 oz (32.5 kg) IBW/kg (Calculated) : 59.3 Used 32.5 kg  Vital Signs: Temp: 98.3 F (36.8 C) (11/15 0447) Temp Source: Oral (11/15 0447) BP: 109/98 (11/14 2138) Pulse Rate: 84 (11/15 0447) Intake/Output from previous day: 11/14 0701 - 11/15 0700 In: 771.3 [P.O.:480; I.V.:291.3] Out: 900 [Urine:900] Intake/Output from this shift: Total I/O In: -  Out: 200 [Urine:200]  Labs: Recent Labs    12/22/16 1955 12/22/16 2009 12/23/16 0430 12/24/16 0432  WBC 6.2  --  5.4 4.0  HGB 16.2* 17.0* 12.0 9.8*  HCT 42.0 50.0* 32.2* 26.8*  PLT 165  --  125* PENDING  CREATININE 1.61* 1.50* 0.97 0.67  MG  --   --  1.6* 2.2  PHOS  --   --   --  <1.0*  ALBUMIN 4.8  --   --   --   PROT 8.9*  --   --   --   AST 39  --   --   --   ALT 21  --   --   --   ALKPHOS 73  --   --   --   BILITOT 1.5*  --   --   --    Estimated Creatinine Clearance: 38.8 mL/min (by C-G formula based on SCr of 0.67 mg/dL).   Microbiology: Recent Results (from the past 720 hour(s))  Blood Culture (routine x 2)     Status: None (Preliminary result)   Collection Time: 12/22/16  7:55 PM  Result Value Ref Range Status   Specimen Description BLOOD RIGHT FOREARM  Final   Special Requests IN PEDIATRIC BOTTLE Blood Culture adequate volume  Final   Culture   Final    NO GROWTH < 24 HOURS Performed at [facility], [address], [place], [place]    Report Status PENDING  Incomplete  Blood Culture (routine x 2)     Status: None (Preliminary result)   Collection Time: 12/22/16  7:57 PM  Result Value Ref Range Status   Specimen Description BLOOD LEFT FOREARM  Final   Special Requests IN PEDIATRIC BOTTLE Blood Culture adequate volume  Final   Culture   Final    NO GROWTH < 24 HOURS Performed at [facility]  Lab, [address], [place], [place]    Report Status PENDING  Incomplete    Medications:  Scheduled:  . enoxaparin (LOVENOX) injection  30 mg Subcutaneous Q24H  . feeding supplement (ENSURE ENLIVE)  237 mL Oral TID BM  . mometasone-formoterol  2 puff Inhalation BID  . multivitamin with minerals  1 tablet Oral Daily  . tiotropium  18 mcg Inhalation Daily   Infusions:  . sodium chloride 75 mL/hr at 12/23/16 2100  . potassium PHOSPHATE IVPB (mmol)      Assessment: Phos = <1   Plan:  Give 20 mmol (~0.6 mmol/kg) Kphos x1 now over 6 hours  2101 12/24/2016,5:42 AM

## 2016-12-24 NOTE — Progress Notes (Signed)
PROGRESS NOTE    CHIQUETTA Hurley  QJJ:[id number] DOB: 1955-11-24 DOA: 12/22/2016 PCP: Jennifer Emery, MD    Brief Narrative:  Jennifer Hurley is a 61 y.o. female with medical history significant of etoh and polysubstance abuse, FTT, copd comes in after family found her at home not eating and drinking very well.  Pt denies any pain anywhere.  She denies any n/v/d although this was reported earlier to ED staff.  She denies any fevers.  Pt found to have hyponatremia and aki and dehydration and referred for admission for such.  She was initially with low bp and then with high bp in ED.  Her ROS is grossly negative.     Assessment & Plan:   Principal Problem:   Failure to thrive (0-17) Active Problems:   Essential hypertension   COPD with emphysema (HCC)   Hypokalemia   Systolic CHF, chronic (HCC)   Severe protein-calorie malnutrition (HCC)   Tobacco abuse   Cocaine abuse (HCC)   Alcohol abuse   Acute kidney injury (HCC)   Dehydration   AKI (acute kidney injury) (HCC)   FTT (failure to thrive) in adult   Hypomagnesemia   Pressure injury of skin  #1 Failure to thrive Questionable etiology.  Noted to be chronic and long-standing.  May be secondary to history of polysubstance abuse.  Patient with no signs or symptoms of infection.  Urinalysis unremarkable.  Chest x-ray negative.  Blood cultures with no growth to date.  Patient is afebrile.  Normal white count.  Hemoglobin was elevated on admission likely secondary to significant dehydration and has trended down.  Check an anemia panel.  TSH within normal limits.  Patient with no overt bleeding.  Continue nutritional supplementation.  IV fluids.  Patient with no prior history of colonoscopy.  Patient noted to have a microcytic anemia.  May benefit from outpatient referral for screening colonoscopy.  2.  Acute kidney injury Likely secondary to a prerenal azotemia as patient was noted to be hypotensive on admission with systolic blood  pressures in the 70s.  Urinalysis negative nitrite, negative leukocytes, negative proteins.  Renal function improving with hydration.  Follow. Follow.  3.  Cough Questionable etiology.  Repeat chest x-ray negative for any acute infiltrates.  Cough improved.  Hycodan as needed.  Patient has been seen in consultation by speech therapy regular diet recommended with thin liquids.    4.  Severe dehydration Likely secondary to problem #1.  Continue IV fluids.  Supportive care.  5.  Hypokalemia/hypomagnesemia Replete.  6.  Hyponatremia Patient presented with a significant hyponatremia noted to be hypotensive on admission.  Likely secondary to hypovolemic hyponatremia.  Sodium levels improving with hydration.  TSH within normal limits at 1.506.  Random cortisol was 7.7 this morning.  Continue IV fluids.  Follow.   7.  Severe protein calorie malnutrition Nutrition following.  Phosphorus level is significantly low and less than 1.0.  Replete phosphorus.  Continue nutritional supplementation.   8.  COPD Stable. Continue Symbicort and Spiriva.  Nebs as needed.  9.  Alcohol abuse Currently stable.  Alcohol level less than 5.  Patient with no overt signs or symptoms of withdrawal.  Follow.  10.  Polysubstance abuse UDS negative.  11.  Anemia Likely dilutional.  Patient with no overt bleeding.  Patient noted to have a microcytic anemia.  Patient with failure to thrive.  No prior history of colonoscopies per patient.  Check an anemia panel. May benefit from referral for ouitpatient  screening colonoscopy.       DVT prophylaxis: Lovenox Code Status: Full Family Communication: Updated patient.  No family at bedside. Disposition Plan: SNF when medically stable and clinically improved hopefully in the next 24-48 hours.   Consultants:   None  Procedures:   Chest x-ray 12/22/2016, 12/23/2016  Antimicrobials:   None   Subjective: Patient sitting up in bed.  Cough improved.  Denies any  chest pain or shortness of breath.  Feels she is improving clinically.   Objective: Vitals:   12/24/16 0447 12/24/16 0900 12/24/16 0903 12/24/16 1253  BP: 113/75   99/70  Pulse: 84   (!) 105  Resp: 18   18  Temp: 98.3 F (36.8 C)   (!) 97.4 F (36.3 C)  TempSrc: Oral   Oral  SpO2: 100% 92% 92% 100%  Weight:      Height:        Intake/Output Summary (Last 24 hours) at 12/24/2016 1435 Last data filed at 12/24/2016 3710 Gross per 24 hour  Intake 2354.17 ml  Output 525 ml  Net 1829.17 ml   Filed Weights   12/22/16 2348  Weight: 32.5 kg (71 lb 10.4 oz)    Examination:  General exam: Cachectic.  Frail Respiratory system: Clear to auscultation bilaterally.  No wheezes, no crackles, no rhonchi..  Poor-fair air movement respiratory effort normal. Cardiovascular system: S1 & S2 heard, RRR. No JVD, murmurs, rubs, gallops or clicks. No pedal edema. Gastrointestinal system: Abdomen is soft, nontender, nondistended, positive bowel sounds.  Central nervous system: Alert and oriented. No focal neurological deficits. Extremities: Symmetric 5 x 5 power. Skin: No rashes, lesions or ulcers Psychiatry: Judgement and insight appear fair. Mood & affect appropriate.     Data Reviewed: I have personally reviewed following labs and imaging studies  CBC: Recent Labs  Lab 12/22/16 1955 12/22/16 2009 12/23/16 0430 12/24/16 0432  WBC 6.2  --  5.4 4.0  NEUTROABS 3.8  --   --   --   HGB 16.2* 17.0* 12.0 9.8*  HCT 42.0 50.0* 32.2* 26.8*  MCV 75.5*  --  75.2* 77.0*  PLT 165  --  125* 107*   Basic Metabolic Panel: Recent Labs  Lab 12/22/16 1955 12/22/16 2009 12/23/16 0430 12/24/16 0432  NA 122* 123* 128* 129*  K 4.3 4.3 2.8* 3.6  CL 87* 91* 98* 99*  CO2 17*  --  19* 23  GLUCOSE 130* 131* 116* 86  BUN 42* 39* 27* 11  CREATININE 1.61* 1.50* 0.97 0.67  CALCIUM 9.6  --  8.7* 8.1*  MG  --   --  1.6* 2.2  PHOS  --   --   --  <1.0*   GFR: Estimated Creatinine Clearance: 38.8 mL/min  (by C-G formula based on SCr of 0.67 mg/dL). Liver Function Tests: Recent Labs  Lab 12/22/16 1955  AST 39  ALT 21  ALKPHOS 73  BILITOT 1.5*  PROT 8.9*  ALBUMIN 4.8   No results for input(s): LIPASE, AMYLASE in the last 168 hours. No results for input(s): AMMONIA in the last 168 hours. Coagulation Profile: No results for input(s): INR, PROTIME in the last 168 hours. Cardiac Enzymes: No results for input(s): CKTOTAL, CKMB, CKMBINDEX, TROPONINI in the last 168 hours. BNP (last 3 results) No results for input(s): PROBNP in the last 8760 hours. HbA1C: No results for input(s): HGBA1C in the last 72 hours. CBG: No results for input(s): GLUCAP in the last 168 hours. Lipid Profile: No results for input(s): CHOL,  HDL, LDLCALC, TRIG, CHOLHDL, LDLDIRECT in the last 72 hours. Thyroid Function Tests: Recent Labs    12/24/16 0432  TSH 1.506   Anemia Panel: No results for input(s): VITAMINB12, FOLATE, FERRITIN, TIBC, IRON, RETICCTPCT in the last 72 hours. Sepsis Labs: Recent Labs  Lab 12/22/16 2203 12/23/16 0020 12/23/16 0443 12/24/16 0657  LATICACIDVEN 2.19* 2.4* 2.6* 1.3    Recent Results (from the past 240 hour(s))  Blood Culture (routine x 2)     Status: None (Preliminary result)   Collection Time: 12/22/16  7:55 PM  Result Value Ref Range Status   Specimen Description BLOOD RIGHT FOREARM  Final   Special Requests IN PEDIATRIC BOTTLE Blood Culture adequate volume  Final   Culture   Final    NO GROWTH 2 DAYS Performed at [facility], [address], [place], [place] [address]    Report Status PENDING  Incomplete  Blood Culture (routine x 2)     Status: None (Preliminary result)   Collection Time: 12/22/16  7:57 PM  Result Value Ref Range Status   Specimen Description BLOOD LEFT FOREARM  Final   Special Requests IN PEDIATRIC BOTTLE Blood Culture adequate volume  Final   Culture   Final    NO GROWTH 2 DAYS Performed at [facility], [address]  [place], [place] [address]    Report Status PENDING  Incomplete  Urine culture     Status: None   Collection Time: 12/22/16  9:39 PM  Result Value Ref Range Status   Specimen Description URINE, CATHETERIZED  Final   Special Requests NONE  Final   Culture   Final    NO GROWTH Performed at [facility], [address] [place], [place] [address]    Report Status 12/24/2016 FINAL  Final         Radiology Studies: Dg Chest Port 1 View  Result Date: 12/23/2016 CLINICAL DATA:  Cough EXAM: PORTABLE CHEST 1 VIEW COMPARISON:  12/22/2016 FINDINGS: The lungs are hyperinflated likely secondary to COPD. There is no focal parenchymal opacity. There is no pleural effusion or pneumothorax. The heart and mediastinal contours are unremarkable. The osseous structures are unremarkable. IMPRESSION: No active disease. Electronically Signed   By: Elige Ko   On: 12/23/2016 12:32   Dg Chest Port 1 View  Result Date: 12/22/2016 CLINICAL DATA:  Shortness of breath and weakness. EXAM: PORTABLE CHEST 1 VIEW COMPARISON:  08/01/2015 FINDINGS: Normal heart size. No pleural effusion or edema. The lungs are hyperinflated. There are coarsened interstitial markings throughout both lungs compatible with emphysema. No superimposed airspace consolidation identified. IMPRESSION: 1. No acute findings. 2. Emphysema. Electronically Signed   By: Signa Kell M.D.   On: 12/22/2016 20:17        Scheduled Meds: . enoxaparin (LOVENOX) injection  20 mg Subcutaneous Q24H  . feeding supplement (ENSURE ENLIVE)  237 mL Oral TID BM  . mometasone-formoterol  2 puff Inhalation BID  . multivitamin with minerals  1 tablet Oral Daily  . tiotropium  18 mcg Inhalation Daily   Continuous Infusions: . sodium chloride 75 mL/hr at 12/23/16 2100     LOS: 1 day    Time spent: 35 minutes    THOMPSON,DANIEL, MD [facility] Pager [phone]  If 7PM-7AM, please contact night-coverage [email or web address] Password  [email or web address] 12/24/2016, 2:35 PM

## 2016-12-24 NOTE — Progress Notes (Signed)
OT Cancellation Note  Patient Details Name: STEHANIE EKSTROM MRN: [id number] DOB: 1955/02/12   Cancelled Treatment:    Reason Eval/Treat Not Completed: Other (comment)  Noted plans for SNF- will defer OT eval to SNF [facility], [place] [phone]  Alba Cory 12/24/2016, 1:48 PM

## 2016-12-24 NOTE — NC FL2 (Signed)
[place] MEDICAID FL2 LEVEL OF CARE SCREENING TOOL     IDENTIFICATION  Patient Name: Jennifer Hurley Birthdate: 04/15/1955 Sex: female Admission Date (Current Location): 12/22/2016  [facility] and [place] Number:  Producer, television/film/video and Address:  [facility],  [address] [place], [place] [address]      Provider Number: [id number]  Attending Physician Name and Address:  Rodolph Bong, MD  Relative Name and Phone Number:       Current Level of Care: Hospital Recommended Level of Care: Skilled Nursing Facility Prior Approval Number:    Date Approved/Denied:   PASRR Number:    Discharge Plan: SNF    Current Diagnoses: Patient Active Problem List   Diagnosis Date Noted  . Pressure injury of skin 12/24/2016  . Hypomagnesemia 12/23/2016  . FTT (failure to thrive) in adult 12/22/2016  . Weight loss   . UTI (lower urinary tract infection) 05/10/2015  . Failure to thrive (0-17) 05/10/2015  . Abdominal pain 05/10/2015  . Failure to thrive in adult 05/10/2015  . Acute kidney injury (HCC) 05/10/2015  . UTI (urinary tract infection) 05/10/2015  . Dehydration   . AKI (acute kidney injury) (HCC)   . Anemia of other chronic disease 06/29/2012  . HCAP (healthcare-associated pneumonia) 05/02/2012  . Tracheostomy status (HCC) 04/26/2012  . PSVT (paroxysmal supraventricular tachycardia) (HCC) 04/18/2012  . Agitation 04/18/2012  . Delirium 04/18/2012  . Acute respiratory failure (HCC) 04/13/2012  . COPD exacerbation (HCC) 04/13/2012  . Severe protein-calorie malnutrition (HCC) 04/12/2012  . Tobacco abuse   . Cocaine abuse (HCC)   . Alcohol abuse   . Persistent pneumonia 02/15/2012  . Chronic respiratory failure with hypoxia (HCC) 02/15/2012  . Trichomoniasis 02/13/2012  . Chloride, decreased level 02/12/2012  . Malnutrition (HCC) 02/12/2012  . Acute respiratory failure with hypoxia (HCC) 10/29/2011  . Leukocytosis 10/28/2011  . Hyponatremia 10/28/2011  .  Hypokalemia 10/28/2011  . Systolic CHF, chronic (HCC) 10/28/2011  . CAP (community acquired pneumonia) 10/28/2011  . SUBSTANCE ABUSE, MULTIPLE 01/19/2008  . Essential hypertension 06/29/2007  . CANDIDIASIS, MOUTH (THRUSH) 07/26/2006  . COPD with emphysema (HCC) 07/26/2006    Orientation RESPIRATION BLADDER Height & Weight     Self, Time, Situation, Place  Normal Incontinent Weight: 71 lb 10.4 oz (32.5 kg) Height:  5\' 6"  (167.6 cm)  BEHAVIORAL SYMPTOMS/MOOD NEUROLOGICAL BOWEL NUTRITION STATUS        Diet(Heart healthy)  AMBULATORY STATUS COMMUNICATION OF NEEDS Skin   Limited Assist Verbally Other (Comment)(Pressure injury stage 1- coccxy)                       Personal Care Assistance Level of Assistance  Bathing, Feeding, Dressing Bathing Assistance: Limited assistance Feeding assistance: Independent Dressing Assistance: Limited assistance     Functional Limitations Info             SPECIAL CARE FACTORS FREQUENCY  PT (By licensed PT), OT (By licensed OT)     PT Frequency: 5 OT Frequency: 5            Contractures      Additional Factors Info  Code Status, Allergies Code Status Info: Full Code  Allergies Info: NKA            Current Medications (12/24/2016):  This is the current hospital active medication list Current Facility-Administered Medications  Medication Dose Route Frequency Provider Last Rate Last Dose  . 0.9 %  sodium chloride infusion   Intravenous Continuous  Rodolph Bong, MD 75 mL/hr at 12/23/16 2100    . enoxaparin (LOVENOX) injection 20 mg  20 mg Subcutaneous Q24H Rodolph Bong, MD      . feeding supplement (ENSURE ENLIVE) (ENSURE ENLIVE) liquid 237 mL  237 mL Oral TID BM Rodolph Bong, MD   237 mL at 12/24/16 1314  . HYDROcodone-homatropine (HYCODAN) 5-1.5 MG/5ML syrup 5 mL  5 mL Oral Q6H PRN Rodolph Bong, MD   5 mL at 12/23/16 1749  . mometasone-formoterol (DULERA) 100-5 MCG/ACT inhaler 2 puff  2 puff Inhalation  BID Rodolph Bong, MD   2 puff at 12/24/16 0902  . multivitamin with minerals tablet 1 tablet  1 tablet Oral Daily Rodolph Bong, MD   1 tablet at 12/24/16 [phone]  . tiotropium (SPIRIVA) inhalation capsule 18 mcg  18 mcg Inhalation Daily Rodolph Bong, MD   18 mcg at 12/24/16 0900     Discharge Medications: Please see discharge summary for a list of discharge medications.  Relevant Imaging Results:  Relevant Lab Results:   Additional Information SS#: [id number]  [name], LCSW

## 2016-12-24 NOTE — Progress Notes (Signed)
  Speech Language Pathology Treatment: Dysphagia  Patient Details Name: MARTICIA REIFSCHNEIDER MRN: [id number] DOB: 11/17/1955 Today's Date: 12/24/2016 Time: 1200-1210 SLP Time Calculation (min) (ACUTE ONLY): 10 min  Assessment / Plan / Recommendation Clinical Impression  Pt sitting upright in bed eating lunch upon SLP entrance to room.  Noted she had not had upper dentures in place and located them in her personal belongings. Cleaned dentures and obtained denture adhesive to allow proper placement for consumption.  Pt educated to need to ask family to bring in denture adhesive to allow her maximal po intake.  Pt self feed fish, mashed potatoes, banana yogurt, applejuice.  NO overt indication of airway compromise with po intake.  She does eat slowly and did not complete her meal during session.  Recommend continue diet as tolerated with general precautions.  SLP will follow up x1 to help maximize intake as safely as possibe. Note last CXR negative for impairment.     HPI HPI: Ptis a 61 y.o.femalewith medical history significant ofetoh and polysubstance abuse, FTT, COPD, MI, and PNA, who comes in after family found her at home with AMS, not eating and drinking very well.       SLP Plan  Continue with current plan of care       Recommendations  Diet recommendations: Regular;Thin liquid Liquids provided via: Cup;Straw Medication Administration: (as tolerated) Supervision: Patient able to self feed Compensations: Minimize environmental distractions;Slow rate;Small sips/bites(dentures in place for po, ask family to bring in adhesive) Postural Changes and/or Swallow Maneuvers: Seated upright 90 degrees;Upright 30-60 min after meal                Oral Care Recommendations: Oral care BID Follow up Recommendations: Skilled Nursing facility SLP Visit Diagnosis: Dysphagia, unspecified (R13.10) Plan: Continue with current plan of care       GO               Donavan Burnet, MS [facility]  SLP [phone]  Chales Abrahams 12/24/2016, 12:26 PM

## 2016-12-24 NOTE — Evaluation (Signed)
Physical Therapy Evaluation Patient Details Name: Jennifer Hurley MRN: [id number] DOB: Jun 11, 1955 Today's Date: 12/24/2016   History of Present Illness  61 y.o. female with medical history significant of ETOH and polysubstance abuse, FTT, COPD, RA, MI and found to have hyponatremia, dehyration, AKI and admitted for failure to thrive  Clinical Impression  Pt admitted with above diagnosis. Pt currently with functional limitations due to the deficits listed below (see PT Problem List).  Pt will benefit from skilled PT to increase their independence and safety with mobility to allow discharge to the venue listed below.  Pt initially agreeable to ambulate however upon sitting EOB, pt with urine saturated gown and bed pad so assisted with changing to new gown and pt reports feeling cold.  Pt then only agreeable to stand to change bed pad.  Pt reports living alone and may benefit from SNF upon d/c.     Follow Up Recommendations SNF;Supervision/Assistance - 24 hour    Equipment Recommendations  None recommended by PT    Recommendations for Other Services       Precautions / Restrictions Precautions Precautions: Fall Restrictions Weight Bearing Restrictions: No      Mobility  Bed Mobility Overal bed mobility: Needs Assistance Bed Mobility: Supine to Sit;Sit to Supine     Supine to sit: Supervision Sit to supine: Supervision   General bed mobility comments: increased time, supervision for safety  Transfers Overall transfer level: Needs assistance Equipment used: Rolling walker (2 wheeled) Transfers: Sit to/from Stand Sit to Stand: Min assist         General transfer comment: assist to rise and steady, pt with urine soaked gown so changed gown however pt reports being cold and then declined ambulating; only performed standing to also change bed pad  Ambulation/Gait                Stairs            Wheelchair Mobility    Modified Rankin (Stroke Patients  Only)       Balance Overall balance assessment: Needs assistance(denies fall hx )         Standing balance support: Bilateral upper extremity supported Standing balance-Leahy Scale: Poor Standing balance comment: requires UE assist                             Pertinent Vitals/Pain Pain Assessment: No/denies pain    Home Living Family/patient expects to be discharged to:: Private residence Living Arrangements: Alone   Type of Home: Apartment Home Access: Level entry     Home Layout: One level Home Equipment: Environmental consultant - 2 wheels      Prior Function Level of Independence: Independent               Hand Dominance        Extremity/Trunk Assessment        Lower Extremity Assessment Lower Extremity Assessment: Generalized weakness(cachexic appearance)       Communication   Communication: No difficulties  Cognition Arousal/Alertness: Awake/alert Behavior During Therapy: WFL for tasks assessed/performed Overall Cognitive Status: No family/caregiver present to determine baseline cognitive functioning                                 General Comments: perseveration on being cold during session      General Comments      Exercises  Assessment/Plan    PT Assessment Patient needs continued PT services  PT Problem List Decreased strength;Decreased knowledge of use of DME;Decreased mobility;Decreased activity tolerance;Decreased balance       PT Treatment Interventions DME instruction;Therapeutic activities;Gait training;Therapeutic exercise;Functional mobility training;Patient/family education;Balance training    PT Goals (Current goals can be found in the Care Plan section)  Acute Rehab PT Goals PT Goal Formulation: With patient Time For Goal Achievement: 01/07/17 Potential to Achieve Goals: Good    Frequency Min 3X/week   Barriers to discharge        Co-evaluation               AM-PAC PT "6 Clicks" Daily  Activity  Outcome Measure Difficulty turning over in bed (including adjusting bedclothes, sheets and blankets)?: None Difficulty moving from lying on back to sitting on the side of the bed? : None Difficulty sitting down on and standing up from a chair with arms (e.g., wheelchair, bedside commode, etc,.)?: Unable Help needed moving to and from a bed to chair (including a wheelchair)?: A Little Help needed walking in hospital room?: A Lot Help needed climbing 3-5 steps with a railing? : A Lot 6 Click Score: 16    End of Session   Activity Tolerance: Other (comment)(pt self limited due to feeling cold) Patient left: in bed;with bed alarm set;with call bell/phone within reach   PT Visit Diagnosis: Other abnormalities of gait and mobility (R26.89)    Time: 2482-5003 PT Time Calculation (min) (ACUTE ONLY): 13 min   Charges:   PT Evaluation $PT Eval Low Complexity: 1 Low     PT G CodesZenovia Jarred, PT, DPT 12/24/2016 Pager: [phone]  Maida Sale E 12/24/2016, 11:25 AM

## 2016-12-25 ENCOUNTER — Inpatient Hospital Stay (HOSPITAL_COMMUNITY): Payer: Medicare Other

## 2016-12-25 DIAGNOSIS — I959 Hypotension, unspecified: Secondary | ICD-10-CM

## 2016-12-25 LAB — BASIC METABOLIC PANEL
ANION GAP: 7 (ref 5–15)
Anion gap: 9 (ref 5–15)
BUN: 5 mg/dL — ABNORMAL LOW (ref 6–20)
BUN: 6 mg/dL (ref 6–20)
CALCIUM: 7.4 mg/dL — AB (ref 8.9–10.3)
CALCIUM: 7.6 mg/dL — AB (ref 8.9–10.3)
CHLORIDE: 93 mmol/L — AB (ref 101–111)
CO2: 23 mmol/L (ref 22–32)
CO2: 24 mmol/L (ref 22–32)
CREATININE: 0.53 mg/dL (ref 0.44–1.00)
Chloride: 93 mmol/L — ABNORMAL LOW (ref 101–111)
Creatinine, Ser: 0.51 mg/dL (ref 0.44–1.00)
GFR calc Af Amer: >60 mL/min (ref >60)
GFR calc non Af Amer: >60 mL/min (ref >60)
GFR calc non Af Amer: >60 mL/min (ref >60)
GLUCOSE: 114 mg/dL — AB (ref 65–99)
Glucose, Bld: 79 mg/dL (ref 65–99)
Potassium: 4.1 mmol/L (ref 3.5–5.1)
Potassium: 3.4 mmol/L — ABNORMAL LOW (ref 3.5–5.1)
SODIUM: 125 mmol/L — AB (ref 135–145)
Sodium: 124 mmol/L — ABNORMAL LOW (ref 135–145)

## 2016-12-25 LAB — CBC WITH DIFFERENTIAL/PLATELET
BASOS ABS: 0 10*3/uL (ref 0.0–0.1)
Basophils Relative: 0 %
Eosinophils Absolute: 0.1 10*3/uL (ref 0.0–0.7)
Eosinophils Relative: 2 %
HEMATOCRIT: 25.5 % — AB (ref 36.0–46.0)
Hemoglobin: 9.3 g/dL — ABNORMAL LOW (ref 12.0–15.0)
LYMPHS PCT: 57 %
Lymphs Abs: 2.7 10*3/uL (ref 0.7–4.0)
MCH: 28.4 pg (ref 26.0–34.0)
MCHC: 36.5 g/dL — ABNORMAL HIGH (ref 30.0–36.0)
MCV: 78 fL (ref 78.0–100.0)
MONO ABS: 0.3 10*3/uL (ref 0.1–1.0)
MONOS PCT: 6 %
NEUTROS ABS: 1.6 10*3/uL — AB (ref 1.7–7.7)
Neutrophils Relative %: 35 %
Platelets: 118 10*3/uL — ABNORMAL LOW (ref 150–400)
RBC: 3.27 MIL/uL — ABNORMAL LOW (ref 3.87–5.11)
RDW: 13.5 % (ref 11.5–15.5)
WBC: 4.7 10*3/uL (ref 4.0–10.5)

## 2016-12-25 LAB — OSMOLALITY: Osmolality: 253 mOsm/kg — ABNORMAL LOW (ref 275–295)

## 2016-12-25 LAB — PHOSPHORUS
PHOSPHORUS: 3.2 mg/dL (ref 2.5–4.6)
PHOSPHORUS: 2.2 mg/dL — AB (ref 2.5–4.6)

## 2016-12-25 LAB — SODIUM, URINE, RANDOM: Sodium, Ur: 99 mmol/L

## 2016-12-25 LAB — OSMOLALITY, URINE: Osmolality, Ur: 424 mOsm/kg (ref 300–900)

## 2016-12-25 LAB — CREATININE, URINE, RANDOM: Creatinine, Urine: 44.87 mg/dL

## 2016-12-25 MED ORDER — COSYNTROPIN 0.25 MG IJ SOLR
0.2500 mg | Freq: Once | INTRAMUSCULAR | Status: AC
  Administered 2016-12-26: 0.25 mg via INTRAVENOUS
  Filled 2016-12-25: qty 0.25

## 2016-12-25 MED ORDER — FLUDROCORTISONE ACETATE 0.1 MG PO TABS
0.0500 mg | ORAL_TABLET | Freq: Every day | ORAL | Status: DC
  Administered 2016-12-25 – 2016-12-26 (×2): 0.05 mg via ORAL
  Filled 2016-12-25 (×4): qty 0.5

## 2016-12-25 MED ORDER — SODIUM GLYCEROPHOSPHATE 1 MMOLE/ML IV SOLN
10.0000 mmol | Freq: Once | INTRAVENOUS | Status: DC
  Filled 2016-12-25: qty 10

## 2016-12-25 MED ORDER — SODIUM PHOSPHATES 45 MMOLE/15ML IV SOLN
10.0000 mmol | Freq: Once | INTRAVENOUS | Status: AC
  Administered 2016-12-25: 10 mmol via INTRAVENOUS
  Filled 2016-12-25: qty 3.33

## 2016-12-25 MED ORDER — SODIUM CHLORIDE 0.9 % IV BOLUS (SEPSIS)
250.0000 mL | Freq: Once | INTRAVENOUS | Status: AC
  Administered 2016-12-25: 250 mL via INTRAVENOUS

## 2016-12-25 MED ORDER — DEXAMETHASONE SODIUM PHOSPHATE 4 MG/ML IJ SOLN
2.0000 mg | Freq: Four times a day (QID) | INTRAMUSCULAR | Status: DC
  Administered 2016-12-25 – 2016-12-26 (×6): 2 mg via INTRAVENOUS
  Filled 2016-12-25 (×5): qty 1

## 2016-12-25 NOTE — Progress Notes (Signed)
PROGRESS NOTE    Jennifer Hurley  CLE:[id number] DOB: 01/23/1956 DOA: 12/22/2016 PCP: Rometta Emery, MD    Brief Narrative:  Jennifer Hurley is a 61 y.o. female with medical history significant of etoh and polysubstance abuse, FTT, copd comes in after family found her at home not eating and drinking very well.  Pt denies any pain anywhere.  She denies any n/v/d although this was reported earlier to ED staff.  She denies any fevers.  Pt found to have hyponatremia and aki and dehydration and referred for admission for such.  She was initially with low bp and then with high bp in ED.  Her ROS is grossly negative.     Assessment & Plan:   Principal Problem:   Failure to thrive (0-17) Active Problems:   Essential hypertension   COPD with emphysema (HCC)   Hypokalemia   Systolic CHF, chronic (HCC)   Severe protein-calorie malnutrition (HCC)   Tobacco abuse   Cocaine abuse (HCC)   Alcohol abuse   Acute kidney injury (HCC)   Dehydration   AKI (acute kidney injury) (HCC)   FTT (failure to thrive) in adult   Hypomagnesemia   Pressure injury of skin   Cough  #1 Failure to thrive Questionable etiology.  Noted to be chronic and long-standing.  May be secondary to history of polysubstance abuse.  Patient with no signs or symptoms of infection.  Urinalysis unremarkable.  Chest x-ray negative.  Blood cultures with no growth to date.  Patient is afebrile.  Normal white count.  Hemoglobin was elevated on admission likely secondary to significant dehydration and has trended down.  Check an anemia panel.  TSH within normal limits.  Patient with no overt bleeding.  We will check a cosyntropin stimulation test to rule out adrenal insufficiency is hyponatremia.  Continue nutritional supplementation.  IV fluids.  Patient with no prior history of colonoscopy.  Patient noted to have a microcytic anemia.  May benefit from outpatient referral for screening colonoscopy.  2.  Acute kidney injury Likely  secondary to a prerenal azotemia as patient was noted to be hypotensive on admission with systolic blood pressures in the 70s.  Urinalysis negative nitrite, negative leukocytes, negative proteins.  Renal function improving with hydration.  Follow. Follow.  3.  Cough Questionable etiology.  Repeat chest x-ray negative for any acute infiltrates.  Cough improving.  Hycodan as needed.  Patient has been seen in consultation by speech therapy regular diet recommended with thin liquids.    4.  Severe dehydration Likely secondary to problem #1.  Continue IV fluids.  Supportive care.  5.  Hypokalemia/hypomagnesemia Replete.  6.  Hyponatremia Patient presented with a significant hyponatremia noted to be hypotensive on admission. ??  Adrenal insufficiency.  Likely secondary to hypovolemic hyponatremia.  Sodium levels initially improved with hydration however hyponatremia has worsened this morning.  Patient also noted to be hypotensive overnight with low blood pressures with systolics in the 70s-80s.  TSH within normal limits at 1.506.  Random cortisol obtained was 7.7.  We will do a cosyntropin stimulation test tomorrow morning 12/26/2016.  We will place on IV dexamethasone 2 mg every 6 and fludrocortisone.  Continue IV fluids.  Follow.  7.  Severe protein calorie malnutrition Nutrition following.  Phosphorus level is significantly low and less than 1.0 which has been repleted per pharmacy.  8.  COPD Stable. Continue Symbicort and Spiriva.  Nebs as needed.  9.  Alcohol abuse Currently stable.  Alcohol level less  than 5.  Patient with no overt signs or symptoms of withdrawal.  Follow.  10.  Polysubstance abuse UDS negative.  11.  Anemia Likely dilutional.  Patient with no overt bleeding.  Patient noted to have a microcytic anemia.  Patient with failure to thrive.  No prior history of colonoscopies per patient.  May benefit from referral for ouitpatient screening colonoscopy.    12. Hypotension ??  Etiology.  Patient noted to be hyponatremic again and also noted to have hypotension overnight.  Concern for possible adrenal insufficiency.  We will check a cosyntropin stimulation test tomorrow morning 12/26/2016.  Place on IV dexamethasone and oral fludrocortisone.  Follow.     DVT prophylaxis: Lovenox Code Status: Full Family Communication: Updated patient.  No family at bedside. Disposition Plan: SNF when medically stable and clinically improved.   Consultants:   None  Procedures:   Chest x-ray 12/22/2016, 12/23/2016  Antimicrobials:   None   Subjective: Patient in bed denies any chest pain or shortness of breath.  Patient states cough is improved.  Patient noted overnight to have systolic blood pressures in the 70s and received a bolus of IV fluids however repeat blood pressure was not done post bolus.  Patient denies any lightheadedness or dizziness.    Objective: Vitals:   12/24/16 2106 12/25/16 0500 12/25/16 0600 12/25/16 0601  BP: 93/65 (!) 82/52 (!) 78/54 (!) 76/52  Pulse: 94 92    Resp: 18 17    Temp: 98.5 F (36.9 C) 98.9 F (37.2 C)    TempSrc: Oral Oral    SpO2: 99% 100%    Weight:      Height:        Intake/Output Summary (Last 24 hours) at 12/25/2016 1143 Last data filed at 12/25/2016 0200 Gross per 24 hour  Intake 1706.67 ml  Output -  Net 1706.67 ml   Filed Weights   12/22/16 2348  Weight: 32.5 kg (71 lb 10.4 oz)    Examination:  General exam: Cachectic.  Frail Respiratory system: Clear to auscultation bilaterally.  No wheezes, no crackles, no rhonchi..  Poor-fair air movement respiratory effort normal. Cardiovascular system: S1 & S2 heard, RRR. No JVD, murmurs, rubs, gallops or clicks. No pedal edema. Gastrointestinal system: Abdomen is soft, nontender, nondistended, positive bowel sounds.  Central nervous system: Alert and oriented. No focal neurological deficits. Extremities: Symmetric 5 x 5 power. Skin: No rashes, lesions or  ulcers Psychiatry: Judgement and insight appear fair. Mood & affect appropriate.     Data Reviewed: I have personally reviewed following labs and imaging studies  CBC: Recent Labs  Lab 12/22/16 1955 12/22/16 2009 12/23/16 0430 12/24/16 0432  WBC 6.2  --  5.4 4.0  NEUTROABS 3.8  --   --   --   HGB 16.2* 17.0* 12.0 9.8*  HCT 42.0 50.0* 32.2* 26.8*  MCV 75.5*  --  75.2* 77.0*  PLT 165  --  125* 107*   Basic Metabolic Panel: Recent Labs  Lab 12/22/16 1955 12/22/16 2009 12/23/16 0430 12/24/16 0432 12/24/16 1942 12/25/16 0442  NA 122* 123* 128* 129*  --  125*  K 4.3 4.3 2.8* 3.6  --  4.1  CL 87* 91* 98* 99*  --  93*  CO2 17*  --  19* 23  --  23  GLUCOSE 130* 131* 116* 86  --  79  BUN 42* 39* 27* 11  --  5*  CREATININE 1.61* 1.50* 0.97 0.67  --  0.53  CALCIUM 9.6  --  8.7* 8.1*  --  7.4*  MG  --   --  1.6* 2.2  --   --   PHOS  --   --   --  <1.0* 1.1* 3.2   GFR: Estimated Creatinine Clearance: 38.8 mL/min (by C-G formula based on SCr of 0.53 mg/dL). Liver Function Tests: Recent Labs  Lab 12/22/16 1955  AST 39  ALT 21  ALKPHOS 73  BILITOT 1.5*  PROT 8.9*  ALBUMIN 4.8   No results for input(s): LIPASE, AMYLASE in the last 168 hours. No results for input(s): AMMONIA in the last 168 hours. Coagulation Profile: No results for input(s): INR, PROTIME in the last 168 hours. Cardiac Enzymes: No results for input(s): CKTOTAL, CKMB, CKMBINDEX, TROPONINI in the last 168 hours. BNP (last 3 results) No results for input(s): PROBNP in the last 8760 hours. HbA1C: No results for input(s): HGBA1C in the last 72 hours. CBG: No results for input(s): GLUCAP in the last 168 hours. Lipid Profile: No results for input(s): CHOL, HDL, LDLCALC, TRIG, CHOLHDL, LDLDIRECT in the last 72 hours. Thyroid Function Tests: Recent Labs    12/24/16 0432  TSH 1.506   Anemia Panel: No results for input(s): VITAMINB12, FOLATE, FERRITIN, TIBC, IRON, RETICCTPCT in the last 72 hours. Sepsis  Labs: Recent Labs  Lab 12/22/16 2203 12/23/16 0020 12/23/16 0443 12/24/16 0657  LATICACIDVEN 2.19* 2.4* 2.6* 1.3    Recent Results (from the past 240 hour(s))  Blood Culture (routine x 2)     Status: None (Preliminary result)   Collection Time: 12/22/16  7:55 PM  Result Value Ref Range Status   Specimen Description BLOOD RIGHT FOREARM  Final   Special Requests IN PEDIATRIC BOTTLE Blood Culture adequate volume  Final   Culture   Final    NO GROWTH 2 DAYS Performed at [facility], [address] [place], [place] [address]    Report Status PENDING  Incomplete  Blood Culture (routine x 2)     Status: None (Preliminary result)   Collection Time: 12/22/16  7:57 PM  Result Value Ref Range Status   Specimen Description BLOOD LEFT FOREARM  Final   Special Requests IN PEDIATRIC BOTTLE Blood Culture adequate volume  Final   Culture   Final    NO GROWTH 2 DAYS Performed at [facility], [address] [place], [place] [address]    Report Status PENDING  Incomplete  Urine culture     Status: None   Collection Time: 12/22/16  9:39 PM  Result Value Ref Range Status   Specimen Description URINE, CATHETERIZED  Final   Special Requests NONE  Final   Culture   Final    NO GROWTH Performed at [facility], [address] [place], [place] [address]    Report Status 12/24/2016 FINAL  Final         Radiology Studies: Dg Chest 1 View  Result Date: 12/25/2016 CLINICAL DATA:  Acute onset of rhonchi. EXAM: CHEST 1 VIEW COMPARISON:  Chest radiograph performed 12/23/2016 FINDINGS: Peribronchial thickening is noted. Flattening of the hemidiaphragms suggests emphysema. Chronic increased interstitial markings are noted. No superimposed focal airspace consolidation is seen. No pleural effusion or pneumothorax is seen. The cardiomediastinal silhouette is normal in size. No acute osseous abnormalities are identified. IMPRESSION: Finding of emphysema, with chronic lung changes.  No focal airspace consolidation seen. Electronically Signed   By: Roanna Raider M.D.   On: 12/25/2016 05:12   Dg Chest [facility]  Result Date: 12/23/2016 CLINICAL DATA:  Cough EXAM: PORTABLE CHEST 1 VIEW COMPARISON:  12/22/2016 FINDINGS: The lungs are hyperinflated likely secondary to COPD. There is no focal parenchymal opacity. There is no pleural effusion or pneumothorax. The heart and mediastinal contours are unremarkable. The osseous structures are unremarkable. IMPRESSION: No active disease. Electronically Signed   By: Elige Ko   On: 12/23/2016 12:32        Scheduled Meds: . Melene Muller ON 12/26/2016] cosyntropin  0.25 mg Intravenous Once  . dexamethasone  2 mg Intravenous Q6H  . enoxaparin (LOVENOX) injection  20 mg Subcutaneous Q24H  . feeding supplement (ENSURE ENLIVE)  237 mL Oral TID BM  . fludrocortisone  0.05 mg Oral Daily  . mometasone-formoterol  2 puff Inhalation BID  . multivitamin with minerals  1 tablet Oral Daily  . tiotropium  18 mcg Inhalation Daily   Continuous Infusions: . sodium chloride 75 mL/hr at 12/24/16 1908     LOS: 2 days    Time spent: 40 minutes    Ramiro Harvest, MD [facility] Pager [phone]  If 7PM-7AM, please contact night-coverage [email or web address] Password [email or web address] 12/25/2016, 11:43 AM

## 2016-12-25 NOTE — Plan of Care (Signed)
Pt using BSC. 

## 2016-12-25 NOTE — Progress Notes (Signed)
On call notified that pt BP was 76/52 manually. 250 ml Bolus on NS ordered.

## 2016-12-25 NOTE — Progress Notes (Signed)
CSW following to assist with transition to SNF for ST rehab at DC. Spoke with pt today to provide bed offers- pt selects [facility]- "it is close to my house and I went there before." Selected facility in the HUB and contacted admissions. Will follow and assist at DC.  Ilean Skill, MSW, LCSW Clinical Social Work 12/25/2016 [phone]

## 2016-12-25 NOTE — Progress Notes (Signed)
Pharmacy: Phosphorus replacement  Patient is a 60 y.o F with failure to thrive, presented to the ED on 11/13 d/t poor oral intake.  Patient was found to have electrolytes abnormalities including low phosphorus.  - 11/15 at 0432: phos level <1 (Kphos 20 mmol x1 at 0621 - 0.6 mmol/kg)             Na low, K 3.6, scr ok, Ca slightly low at 8.1             At 1942  phos level 1.1 (kphos 20 mmol x1 given at 2315) - 11/15 at 0442: phos level  Therapeutic 3.2 (drawn too early, need to draw at least 6 hrs after infusion-- infusion still running when drawn) Na low 125, K 4.1   ++ level drawn ~6 hrs after completion of PM dose on 11/15 now back low at 2.2 ++  Plan: -  sodium phosphate 10 mmol IV x1 today - check level with AM labs on 11/17 and redose if needed  Dorna Leitz, PharmD, BCPS 12/25/2016 7:59 AM

## 2016-12-26 DIAGNOSIS — E271 Primary adrenocortical insufficiency: Secondary | ICD-10-CM | POA: Diagnosis present

## 2016-12-26 LAB — BASIC METABOLIC PANEL
Anion gap: 8 (ref 5–15)
Anion gap: 10 (ref 5–15)
BUN: 8 mg/dL (ref 6–20)
BUN: 6 mg/dL (ref 6–20)
CALCIUM: 6.2 mg/dL — AB (ref 8.9–10.3)
CHLORIDE: 91 mmol/L — AB (ref 101–111)
CO2: 21 mmol/L — ABNORMAL LOW (ref 22–32)
CO2: 27 mmol/L (ref 22–32)
CREATININE: 0.53 mg/dL (ref 0.44–1.00)
Calcium: 8.3 mg/dL — ABNORMAL LOW (ref 8.9–10.3)
Chloride: 103 mmol/L (ref 101–111)
Creatinine, Ser: 0.34 mg/dL — ABNORMAL LOW (ref 0.44–1.00)
GFR calc Af Amer: >60 mL/min (ref >60)
GFR calc Af Amer: >60 mL/min (ref >60)
GLUCOSE: 103 mg/dL — AB (ref 65–99)
GLUCOSE: 146 mg/dL — AB (ref 65–99)
Potassium: 2.5 mmol/L — CL (ref 3.5–5.1)
Potassium: 3.1 mmol/L — ABNORMAL LOW (ref 3.5–5.1)
SODIUM: 128 mmol/L — AB (ref 135–145)
Sodium: 132 mmol/L — ABNORMAL LOW (ref 135–145)

## 2016-12-26 LAB — MAGNESIUM: Magnesium: 1 mg/dL — ABNORMAL LOW (ref 1.7–2.4)

## 2016-12-26 LAB — ALBUMIN: Albumin: 2.4 g/dL — ABNORMAL LOW (ref 3.5–5.0)

## 2016-12-26 LAB — CBC WITH DIFFERENTIAL/PLATELET
BASOS ABS: 0 10*3/uL (ref 0.0–0.1)
Basophils Relative: 0 %
EOS PCT: 0 %
Eosinophils Absolute: 0 10*3/uL (ref 0.0–0.7)
HCT: 22 % — ABNORMAL LOW (ref 36.0–46.0)
Hemoglobin: 7.9 g/dL — ABNORMAL LOW (ref 12.0–15.0)
LYMPHS PCT: 32 %
Lymphs Abs: 0.6 10*3/uL — ABNORMAL LOW (ref 0.7–4.0)
MCH: 27.7 pg (ref 26.0–34.0)
MCHC: 35.9 g/dL (ref 30.0–36.0)
MCV: 77.2 fL — AB (ref 78.0–100.0)
MONO ABS: 0.2 10*3/uL (ref 0.1–1.0)
Monocytes Relative: 8 %
Neutro Abs: 1.2 10*3/uL — ABNORMAL LOW (ref 1.7–7.7)
Neutrophils Relative %: 60 %
PLATELETS: 126 10*3/uL — AB (ref 150–400)
RBC: 2.85 MIL/uL — ABNORMAL LOW (ref 3.87–5.11)
RDW: 13.5 % (ref 11.5–15.5)
WBC: 1.9 10*3/uL — ABNORMAL LOW (ref 4.0–10.5)

## 2016-12-26 LAB — PHOSPHORUS: PHOSPHORUS: 1.5 mg/dL — AB (ref 2.5–4.6)

## 2016-12-26 LAB — CORTISOL: CORTISOL PLASMA: 0.9 ug/dL

## 2016-12-26 MED ORDER — POTASSIUM CHLORIDE 20 MEQ PO PACK
40.0000 meq | PACK | ORAL | Status: DC
  Filled 2016-12-26: qty 2

## 2016-12-26 MED ORDER — HYDROCORTISONE 10 MG PO TABS
10.0000 mg | ORAL_TABLET | Freq: Every day | ORAL | Status: DC
  Administered 2016-12-26 – 2016-12-27 (×2): 10 mg via ORAL
  Filled 2016-12-26 (×2): qty 1

## 2016-12-26 MED ORDER — POTASSIUM CHLORIDE CRYS ER 20 MEQ PO TBCR
40.0000 meq | EXTENDED_RELEASE_TABLET | ORAL | Status: AC
  Administered 2016-12-26 (×2): 40 meq via ORAL
  Filled 2016-12-26 (×2): qty 2

## 2016-12-26 MED ORDER — MAGNESIUM SULFATE 4 GM/100ML IV SOLN
4.0000 g | Freq: Once | INTRAVENOUS | Status: AC
  Administered 2016-12-26: 4 g via INTRAVENOUS
  Filled 2016-12-26: qty 100

## 2016-12-26 MED ORDER — SODIUM PHOSPHATES 45 MMOLE/15ML IV SOLN
10.0000 mmol | Freq: Once | INTRAVENOUS | Status: AC
  Administered 2016-12-26: 10 mmol via INTRAVENOUS
  Filled 2016-12-26: qty 3.33

## 2016-12-26 MED ORDER — HYDROCORTISONE 20 MG PO TABS
20.0000 mg | ORAL_TABLET | Freq: Every morning | ORAL | Status: DC
  Administered 2016-12-27 – 2016-12-28 (×2): 20 mg via ORAL
  Filled 2016-12-26 (×2): qty 1

## 2016-12-26 MED ORDER — ACETAMINOPHEN 325 MG PO TABS
650.0000 mg | ORAL_TABLET | ORAL | Status: DC | PRN
  Administered 2016-12-26 – 2017-01-01 (×8): 650 mg via ORAL
  Filled 2016-12-26 (×10): qty 2

## 2016-12-26 MED ORDER — K PHOS MONO-SOD PHOS DI & MONO 155-852-130 MG PO TABS
250.0000 mg | ORAL_TABLET | Freq: Two times a day (BID) | ORAL | Status: DC
  Administered 2016-12-26 – 2016-12-27 (×3): 250 mg via ORAL
  Filled 2016-12-26 (×3): qty 1

## 2016-12-26 MED ORDER — POTASSIUM CHLORIDE CRYS ER 20 MEQ PO TBCR
40.0000 meq | EXTENDED_RELEASE_TABLET | ORAL | Status: AC
  Administered 2016-12-26 – 2016-12-27 (×2): 40 meq via ORAL
  Filled 2016-12-26 (×2): qty 2

## 2016-12-26 NOTE — Progress Notes (Signed)
Pharmacy: Phosphorus replacement  Patient is a 61 y.o F with failure to thrive, presented to the ED on 11/13 d/t poor oral intake.  Patient was found to have electrolytes abnormalities including low phosphorus.  - 11/15 at 0432: phos level <1 (Kphos 20 mmol x1 at 0621 - 0.6 mmol/kg)             Na low, K 3.6, scr ok, Ca slightly low at 8.1             At 1942  phos level 1.1 (kphos 20 mmol x1 given at 2315) - 11/16 at 0442: phos level  Therapeutic 3.2 (drawn too early, need to draw at least 6 hrs after infusion-- infusion still running when drawn) Na low 125, K 4.1 -11/16 at 1223: phos level drawn ~6 hrs after completion of PM dose on 11/15 now back low at 2.2  -11/17 @0536 : Phos has dropped to 1.5, Na 132, K+2.5, Magnesium 1.0, CorrCa 7.48  Assessment/Plan: MD has already ordered- -  sodium phosphate 10 mmol IV x1 today -  Kdur PO x2 doses  - Discussed further repletion with Dr .  Will add Magnesium 4gm IV x1 & schedule KPhos 250mg  tablets (MMol Phos/tab) BID.   -Already on daily MVI.  Since tolerating oral meds, may need to consider additional of other scheduled electrolyte replacement with MagOx & Kdur.   -F/U Bmet, Mag, Phos with AM labs 11/18  , PharmD, BCPS Pager: [phone] 12/26/2016 8:46 AM

## 2016-12-26 NOTE — Progress Notes (Signed)
CRITICAL VALUE ALERT  Critical Value:  K 2.5, Cal 6.2  Date & Time Notied:  12/26/2016 7:18 AM    Provider Notified: Dr Ramiro Harvest Team 2  Orders Received/Actions taken:  Not at this time, will report off to day shift RN Victorino Dike who will resume care.

## 2016-12-26 NOTE — Progress Notes (Addendum)
PROGRESS NOTE    Jennifer Hurley  OMB:[id number] DOB: 02-Jun-1955 DOA: 12/22/2016 PCP: No primary care provider on file.    Brief Narrative:  Jennifer Hurley is a 61 y.o. female with medical history significant of etoh and polysubstance abuse, FTT, copd comes in after family found her at home not eating and drinking very well.  Pt denies any pain anywhere.  She denies any n/v/d although this was reported earlier to ED staff.  She denies any fevers.  Pt found to have hyponatremia and aki and dehydration and referred for admission for such.  She was initially with low bp and then with high bp in ED.  Her ROS is grossly negative.     Assessment & Plan:   Principal Problem:   Failure to thrive (0-17) Active Problems:   Hypotension   Essential hypertension   COPD with emphysema (HCC)   Hypokalemia   Systolic CHF, chronic (HCC)   Severe protein-calorie malnutrition (HCC)   Tobacco abuse   Cocaine abuse (HCC)   Alcohol abuse   Acute kidney injury (HCC)   Dehydration   AKI (acute kidney injury) (HCC)   FTT (failure to thrive) in adult   Hypomagnesemia   Pressure injury of skin   Cough  #1 Failure to thrive Questionable etiology.  Noted to be chronic and long-standing.  May be secondary adrenal insufficiency.   Patient with no signs or symptoms of infection.  Urinalysis unremarkable.  Chest x-ray negative.  Blood cultures with no growth to date.  Patient is afebrile.  Normal white count.  Hemoglobin was elevated on admission likely secondary to significant dehydration and has trended down.   TSH within normal limits.  Patient with no overt bleeding.  Patient status post cosyntropin stim test 12/26/2016.  Initial cortisol level at 1.  Consistent with adrenal insufficiency.  Patient on IV dexamethasone and fludrocortisone with improvement in blood pressure and hyponatremia.  Will discontinue IV dexamethasone and fludrocortisone and start on oral hydrocortisone 20 mg in the morning and 10 mg  at night. Continue nutritional supplementation.  IV fluids.  Patient with no prior history of colonoscopy.  Patient noted to have a microcytic anemia.  May benefit from outpatient referral for screening colonoscopy.  #2 adrenal insufficiency Patient noted to be hypotensive, hyponatremic, hypokalemic with hypomagnesemia.  Concern for adrenal insufficiency.  Cosyntropin stimulation test done.  Baseline cortisol level was 1.  Improvement with blood pressure in hyponatremia on IV dexamethasone and fludrocortisone.  Will discontinue IV dexamethasone and fludrocortisone and place on oral hydrocortisone 20 mg in the morning and 10 mg at night.  Patient will likely need outpatient follow-up with endocrinology.  3.  Acute kidney injury Likely secondary to a prerenal azotemia as patient was noted to be hypotensive on admission with systolic blood pressures in the 70s.  Urinalysis negative nitrite, negative leukocytes, negative proteins.  Renal function improving with hydration.  Follow. Follow.  4.  Cough Questionable etiology.  Repeat chest x-ray negative for any acute infiltrates.  Cough improving.  Hycodan as needed.  Patient has been seen in consultation by speech therapy regular diet recommended with thin liquids.    5.  Severe dehydration Likely secondary to problem #1.  Continue IV fluids.  Supportive care.  6.  Hypokalemia/hypomagnesemia Replete.  7.  Hyponatremia Patient presented with a significant hyponatremia noted to be hypotensive on admission.  Likely adrenal insufficiency.  Likely secondary to hypovolemic hyponatremia.  Sodium levels initially improved with hydration however hyponatremia has worsened this  morning.  Patient also noted to be hypotensive the night of 12/24/2016, with low blood pressures with systolics in the 70s-80s.  TSH within normal limits at 1.506.  Random cortisol obtained was 7.7.  Cosyntropin stim test pending with initial cortisol level 12/26/2016 at 1 likely secondary  to adrenal insufficiency.  Hyponatremia improved with IV dexamethasone and fludrocortisone.  DC IV dexamethasone and fludrocortisone and placed on oral hydrocortisone 20 mg in the morning and 10 mg at night.  Gentle hydration.  Follow.   8.  Severe protein calorie malnutrition Nutrition following.  Phosphorus level is significantly low and less than 1.0 which has been repleted per pharmacy.  Repeat phosphorus levels are low.  Replete.  9.  COPD Stable. Continue Symbicort and Spiriva.  Nebs as needed.  10.  Alcohol abuse Currently stable.  Alcohol level less than 5.  Patient with no overt signs or symptoms of withdrawal.  Follow.  11.  Polysubstance abuse UDS negative.  12.  Microcytic anemia Likely dilutional.  Patient with no overt bleeding.  Patient noted to have a microcytic anemia.  Patient with failure to thrive.  No prior history of colonoscopies per patient.  May benefit from referral for ouitpatient screening colonoscopy.    13. Hypotension ?? Etiology.  Concern for probable adrenal insufficiency.  Patient noted to be hyponatremiC and also noted to have hypotension the night of 12/24/2016.  Cosyntropin stimulation study done with results pending however baseline cortisol obtained was 1 which is likely consistent with adrenal insufficiency.  Blood pressure improved on IV dexamethasone and oral fludrocortisone. Will change IV Decadron and oral fludrocortisone to oral hydrocortisone 20 mg every morning and 10 mg nightly.  Follow.    14.  Hypocalcemia Questionable etiology.  Patient noted to have low magnesium and low phosphorus levels.  Check a intact PTH and calcium level.  Check a vitamin D 25-hydroxy level.  Follow.     DVT prophylaxis: Lovenox Code Status: Full Family Communication: Updated patient.  No family at bedside. Disposition Plan: SNF when medically stable and clinically improved.   Consultants:   None  Procedures:   Chest x-ray 12/22/2016,  12/23/2016  Antimicrobials:   None   Subjective: Patient states feeling better.  Denies shortness of breath.  Still with a chronic cough.  Patient denies any abdominal pain.  Patient asking when she is going to be able to be discharged.  Objective: Vitals:   12/25/16 2050 12/26/16 0553 12/26/16 0735 12/26/16 1518  BP: 99/78 95/66 124/87 118/75  Pulse: (!) 101 90 96 99  Resp: 18 16 18 20   Temp: 98.2 F (36.8 C) 98.6 F (37 C) 98.2 F (36.8 C) 98.2 F (36.8 C)  TempSrc: Oral Oral Oral Oral  SpO2: 100%  100% 97%  Weight:  35.6 kg (78 lb 6.4 oz)    Height:        Intake/Output Summary (Last 24 hours) at 12/26/2016 1721 Last data filed at 12/26/2016 1522 Gross per 24 hour  Intake 1300 ml  Output 1050 ml  Net 250 ml   Filed Weights   12/22/16 2348 12/26/16 0553  Weight: 32.5 kg (71 lb 10.4 oz) 35.6 kg (78 lb 6.4 oz)    Examination:  General exam: Cachectic.  Frail Respiratory system: Clear to auscultation bilaterally.  No wheezes, no crackles, no rhonchi. Fair air movement, respiratory effort normal. Cardiovascular system: S1 & S2 heard, RRR. No JVD, murmurs, rubs, gallops or clicks. No pedal edema. Gastrointestinal system: Abdomen is soft, nontender, nondistended, positive  bowel sounds.  Central nervous system: Alert and oriented. No focal neurological deficits. Extremities: Symmetric 5 x 5 power. Skin: No rashes, lesions or ulcers Psychiatry: Judgement and insight appear fair. Mood & affect appropriate.     Data Reviewed: I have personally reviewed following labs and imaging studies  CBC: Recent Labs  Lab 12/22/16 1955 12/22/16 2009 12/23/16 0430 12/24/16 0432 12/25/16 1223 12/26/16 0536  WBC 6.2  --  5.4 4.0 4.7 1.9*  NEUTROABS 3.8  --   --   --  1.6* 1.2*  HGB 16.2* 17.0* 12.0 9.8* 9.3* 7.9*  HCT 42.0 50.0* 32.2* 26.8* 25.5* 22.0*  MCV 75.5*  --  75.2* 77.0* 78.0 77.2*  PLT 165  --  125* 107* 118* 126*   Basic Metabolic Panel: Recent Labs  Lab  12/23/16 0430 12/24/16 0432 12/24/16 1942 12/25/16 0442 12/25/16 1223 12/26/16 0536 12/26/16 1558  NA 128* 129*  --  125* 124* 132* 128*  K 2.8* 3.6  --  4.1 3.4* 2.5* 3.1*  CL 98* 99*  --  93* 93* 103 91*  CO2 19* 23  --  23 24 21* 27  GLUCOSE 116* 86  --  79 114* 103* 146*  BUN 27* 11  --  5* 6 8 6   CREATININE 0.97 0.67  --  0.53 0.51 0.34* 0.53  CALCIUM 8.7* 8.1*  --  7.4* 7.6* 6.2* 8.3*  MG 1.6* 2.2  --   --   --  1.0*  --   PHOS  --  <1.0* 1.1* 3.2 2.2* 1.5*  --    GFR: Estimated Creatinine Clearance: 42.6 mL/min (by C-G formula based on SCr of 0.53 mg/dL). Liver Function Tests: Recent Labs  Lab 12/22/16 1955 12/26/16 0536  AST 39  --   ALT 21  --   ALKPHOS 73  --   BILITOT 1.5*  --   PROT 8.9*  --   ALBUMIN 4.8 2.4*   No results for input(s): LIPASE, AMYLASE in the last 168 hours. No results for input(s): AMMONIA in the last 168 hours. Coagulation Profile: No results for input(s): INR, PROTIME in the last 168 hours. Cardiac Enzymes: No results for input(s): CKTOTAL, CKMB, CKMBINDEX, TROPONINI in the last 168 hours. BNP (last 3 results) No results for input(s): PROBNP in the last 8760 hours. HbA1C: No results for input(s): HGBA1C in the last 72 hours. CBG: No results for input(s): GLUCAP in the last 168 hours. Lipid Profile: No results for input(s): CHOL, HDL, LDLCALC, TRIG, CHOLHDL, LDLDIRECT in the last 72 hours. Thyroid Function Tests: Recent Labs    12/24/16 0432  TSH 1.506   Anemia Panel: No results for input(s): VITAMINB12, FOLATE, FERRITIN, TIBC, IRON, RETICCTPCT in the last 72 hours. Sepsis Labs: Recent Labs  Lab 12/22/16 2203 12/23/16 0020 12/23/16 0443 12/24/16 0657  LATICACIDVEN 2.19* 2.4* 2.6* 1.3    Recent Results (from the past 240 hour(s))  Blood Culture (routine x 2)     Status: None (Preliminary result)   Collection Time: 12/22/16  7:55 PM  Result Value Ref Range Status   Specimen Description BLOOD RIGHT FOREARM  Final    Special Requests IN PEDIATRIC BOTTLE Blood Culture adequate volume  Final   Culture   Final    NO GROWTH 3 DAYS Performed at [facility], [address] [place], [place]    Report Status PENDING  Incomplete  Blood Culture (routine x 2)     Status: None (Preliminary result)   Collection  Time: 12/22/16  7:57 PM  Result Value Ref Range Status   Specimen Description BLOOD LEFT FOREARM  Final   Special Requests IN PEDIATRIC BOTTLE Blood Culture adequate volume  Final   Culture   Final    NO GROWTH 3 DAYS Performed at [facility], [address] [place], [place] [address]    Report Status PENDING  Incomplete  Urine culture     Status: None   Collection Time: 12/22/16  9:39 PM  Result Value Ref Range Status   Specimen Description URINE, CATHETERIZED  Final   Special Requests NONE  Final   Culture   Final    NO GROWTH Performed at [facility], [address] [place], [place] [address]    Report Status 12/24/2016 FINAL  Final         Radiology Studies: Dg Chest 1 View  Result Date: 12/25/2016 CLINICAL DATA:  Acute onset of rhonchi. EXAM: CHEST 1 VIEW COMPARISON:  Chest radiograph performed 12/23/2016 FINDINGS: Peribronchial thickening is noted. Flattening of the hemidiaphragms suggests emphysema. Chronic increased interstitial markings are noted. No superimposed focal airspace consolidation is seen. No pleural effusion or pneumothorax is seen. The cardiomediastinal silhouette is normal in size. No acute osseous abnormalities are identified. IMPRESSION: Finding of emphysema, with chronic lung changes. No focal airspace consolidation seen. Electronically Signed   By: Roanna RaiderJeffery  Chang M.D.   On: 12/25/2016 05:12        Scheduled Meds: . dexamethasone  2 mg Intravenous Q6H  . enoxaparin (LOVENOX) injection  20 mg Subcutaneous Q24H  . feeding supplement (ENSURE ENLIVE)  237 mL Oral TID BM  . fludrocortisone  0.05 mg Oral Daily  . mometasone-formoterol   2 puff Inhalation BID  . multivitamin with minerals  1 tablet Oral Daily  . phosphorus  250 mg Oral BID  . tiotropium  18 mcg Inhalation Daily   Continuous Infusions: . sodium chloride 75 mL/hr at 12/25/16 2033     LOS: 3 days    Time spent: 40 minutes    Ramiro Harvestaniel Quinten [place], MD [facility] Pager [phone]  If 7PM-7AM, please contact night-coverage [email or web address] Password [email or web address] 12/26/2016, 5:21 PM

## 2016-12-26 NOTE — Progress Notes (Addendum)
Pt holding Denture in her hand wrapped in a tissue. Patient offered a denture cup and staff tried to explain to Pt that we are trying to prevent losing the denture. Pt states "I am going to hold my denture I don't want that"

## 2016-12-27 LAB — CULTURE, BLOOD (ROUTINE X 2)
CULTURE: NO GROWTH
Culture: NO GROWTH
Special Requests: ADEQUATE
Special Requests: ADEQUATE

## 2016-12-27 LAB — CBC WITH DIFFERENTIAL/PLATELET
Basophils Absolute: 0 10*3/uL (ref 0.0–0.1)
Basophils Relative: 0 %
Eosinophils Absolute: 0 10*3/uL (ref 0.0–0.7)
Eosinophils Relative: 0 %
HCT: 27.2 % — ABNORMAL LOW (ref 36.0–46.0)
HEMOGLOBIN: 9.9 g/dL — AB (ref 12.0–15.0)
LYMPHS ABS: 1.2 10*3/uL (ref 0.7–4.0)
LYMPHS PCT: 30 %
MCH: 28 pg (ref 26.0–34.0)
MCHC: 36.4 g/dL — ABNORMAL HIGH (ref 30.0–36.0)
MCV: 76.8 fL — AB (ref 78.0–100.0)
MONOS PCT: 9 %
Monocytes Absolute: 0.4 10*3/uL (ref 0.1–1.0)
NEUTROS PCT: 62 %
Neutro Abs: 2.6 10*3/uL (ref 1.7–7.7)
Platelets: 180 10*3/uL (ref 150–400)
RBC: 3.54 MIL/uL — AB (ref 3.87–5.11)
RDW: 13.5 % (ref 11.5–15.5)
WBC: 4.2 10*3/uL (ref 4.0–10.5)

## 2016-12-27 LAB — COMPREHENSIVE METABOLIC PANEL
ALT: 12 U/L — ABNORMAL LOW (ref 14–54)
AST: 30 U/L (ref 15–41)
Albumin: 3.2 g/dL — ABNORMAL LOW (ref 3.5–5.0)
Alkaline Phosphatase: 36 U/L — ABNORMAL LOW (ref 38–126)
Anion gap: 9 (ref 5–15)
BUN: 9 mg/dL (ref 6–20)
CHLORIDE: 91 mmol/L — AB (ref 101–111)
CO2: 27 mmol/L (ref 22–32)
Calcium: 8.5 mg/dL — ABNORMAL LOW (ref 8.9–10.3)
Creatinine, Ser: 0.47 mg/dL (ref 0.44–1.00)
Glucose, Bld: 105 mg/dL — ABNORMAL HIGH (ref 65–99)
POTASSIUM: 3.9 mmol/L (ref 3.5–5.1)
SODIUM: 127 mmol/L — AB (ref 135–145)
Total Bilirubin: 0.3 mg/dL (ref 0.3–1.2)
Total Protein: 6.1 g/dL — ABNORMAL LOW (ref 6.5–8.1)

## 2016-12-27 LAB — MAGNESIUM: Magnesium: 1.6 mg/dL — ABNORMAL LOW (ref 1.7–2.4)

## 2016-12-27 LAB — PHOSPHORUS: PHOSPHORUS: 2 mg/dL — AB (ref 2.5–4.6)

## 2016-12-27 MED ORDER — K PHOS MONO-SOD PHOS DI & MONO 155-852-130 MG PO TABS
250.0000 mg | ORAL_TABLET | Freq: Three times a day (TID) | ORAL | Status: DC
  Administered 2016-12-27 – 2016-12-30 (×9): 250 mg via ORAL
  Filled 2016-12-27 (×10): qty 1

## 2016-12-27 MED ORDER — FUROSEMIDE 10 MG/ML IJ SOLN
20.0000 mg | Freq: Once | INTRAMUSCULAR | Status: AC
  Administered 2016-12-27: 20 mg via INTRAVENOUS
  Filled 2016-12-27: qty 2

## 2016-12-27 MED ORDER — DEXTROSE 5 % IV SOLN
10.0000 mmol | Freq: Once | INTRAVENOUS | Status: AC
  Administered 2016-12-27: 10 mmol via INTRAVENOUS
  Filled 2016-12-27: qty 3.33

## 2016-12-27 MED ORDER — MAGNESIUM SULFATE 4 GM/100ML IV SOLN
4.0000 g | Freq: Once | INTRAVENOUS | Status: AC
  Administered 2016-12-27: 4 g via INTRAVENOUS
  Filled 2016-12-27: qty 100

## 2016-12-27 NOTE — Progress Notes (Signed)
Patient had an uneventful day, very anxious  and thinks she is going home, but plan is to go to skilled facility. Will continue with plan of care.

## 2016-12-27 NOTE — Progress Notes (Signed)
Pharmacy: Phosphorus replacement  Patient is a 61 y.o F with failure to thrive, presented to the ED on 11/13 d/t poor oral intake.  Patient was found to have electrolytes abnormalities including low phosphorus.  - 11/15 at 0432: phos level <1 (Kphos 20 mmol x1 at 0621 - 0.6 mmol/kg)             Na low, K 3.6, scr ok, Ca slightly low at 8.1             At 1942  phos level 1.1 (kphos 20 mmol x1 given at 2315) - 11/16 at 0442: phos level  Therapeutic 3.2 (drawn too early, need to draw at least 6 hrs after infusion-- infusion still running when drawn) Na low 125, K 4.1 -11/16 at 1223: phos level drawn ~6 hrs after completion of PM dose on 11/15 now back low at 2.2  -11/17 @0536 : Phos has dropped to 1.5, Na 132, K+2.5, Magnesium 1.0, CorrCa 7.48 -11/18 @0747 : Phos slightly low at 2.0, Na 127, K+ 3.9, Mag 1.6, CorrCa WNL  Assessment/Plan: Increase phosphorus tablet to TID  F/U phosphorus labs in am  Contact MD to replete Magnesium.  MD ordered IV NaPhos 05-02-1999 as well.   , PharmD, BCPS Pager: [phone] 12/27/2016 10:09 AM

## 2016-12-27 NOTE — Progress Notes (Signed)
PROGRESS NOTE    Jennifer Hurley  MAY:[id number] DOB: [birth year] DOA: 12/22/2016 PCP: No primary care provider on file.    Brief Narrative:  Jennifer Hurley is a 61 y.o. female with medical history significant of etoh and polysubstance abuse, FTT, copd comes in after family found her at home not eating and drinking very well.  Pt denies any pain anywhere.  She denies any n/v/d although this was reported earlier to ED staff.  She denies any fevers.  Pt found to have hyponatremia and aki and dehydration and referred for admission for such.  She was initially with low bp and then with high bp in ED.  Her ROS is grossly negative.     Assessment & Plan:   Principal Problem:   Failure to thrive (0-17) Active Problems:   Hypotension   Adrenal insufficiency (Addison's disease) (HCC)   Essential hypertension   COPD with emphysema (HCC)   Hypokalemia   Systolic CHF, chronic (HCC)   Severe protein-calorie malnutrition (HCC)   Tobacco abuse   Cocaine abuse (HCC)   Alcohol abuse   Acute kidney injury (HCC)   Dehydration   AKI (acute kidney injury) (HCC)   FTT (failure to thrive) in adult   Hypomagnesemia   Pressure injury of skin   Cough  #1 Failure to thrive Questionable etiology.  Noted to be chronic and long-standing.  May be secondary adrenal insufficiency.   Patient with no signs or symptoms of infection.  Urinalysis unremarkable.  Chest x-ray negative.  Blood cultures with no growth to date.  Patient is afebrile.  Normal white count.  Hemoglobin was elevated on admission likely secondary to significant dehydration and has trended down.   TSH within normal limits.  Patient with no overt bleeding.  Patient status post cosyntropin stim test 12/26/2016.  Initial cortisol level at 1.  Consistent with adrenal insufficiency.  Patient was on IV dexamethasone and fludrocortisone with improvement in blood pressure and hyponatremia.  Changed IV dexamethasone and fludrocortisone to oral  hydrocortisone 20 mg in the morning and 10 mg at night. Continue nutritional supplementation.  Saline lock IV fluids.  Patient with no prior history of colonoscopy.  Patient noted to have a microcytic anemia.  May benefit from outpatient referral for screening colonoscopy.  #2 adrenal insufficiency Patient noted to be hypotensive, hyponatremic, hypokalemic with hypomagnesemia.  Concern for adrenal insufficiency.  Cosyntropin stimulation test done.  Baseline cortisol level was 1.  Improvement with blood pressure in hyponatremia on IV dexamethasone and fludrocortisone.  Discontinued IV dexamethasone and fludrocortisone and started on oral hydrocortisone 20 mg in the morning and 10 mg at night.  Patient will likely need outpatient follow-up with endocrinology.  3.  Acute kidney injury Likely secondary to a prerenal azotemia as patient was noted to be hypotensive on admission with systolic blood pressures in the 70s.  Urinalysis negative nitrite, negative leukocytes, negative proteins.  Renal function improved with hydration.  Follow.   4.  Cough Questionable etiology.  Repeat chest x-ray negative for any acute infiltrates.  Cough improving.  Hycodan as needed.  Patient has been seen in consultation by speech therapy regular diet recommended with thin liquids.    5.  Severe dehydration Likely secondary to problem #1.  Saline lock IV fluids.  Supportive care.  6.  Hypokalemia/hypomagnesemia Replete.  7.  Hyponatremia Patient presented with a significant hyponatremia noted to be hypotensive on admission.  Likely adrenal insufficiency.  Likely secondary to hypovolemic hyponatremia.  Sodium levels initially improved  with hydration however hyponatremia has worsened this morning.  Patient also noted to be hypotensive the night of 12/24/2016, with low blood pressures with systolics in the 70s-80s.  TSH within normal limits at 1.506.  Random cortisol obtained was 7.7.  Cosyntropin stim test pending with  initial cortisol level 12/26/2016 at 1 likely secondary to adrenal insufficiency.  Hyponatremia improved with IV dexamethasone and fludrocortisone.  Hyponatremia fluctuating.  IV dexamethasone and fludrocortisone have been changed to oral hydrocortisone 20 mg in the morning and 10 mg at night.  Saline lock IV fluids.  We will give a dose of IV Lasix 20 mg x1.  Follow.   8.  Severe protein calorie malnutrition Nutrition following.  Phosphorus level is significantly low and less than 1.0 which was repleted per pharmacy.  Phosphorus levels still low and will replete.  Nutritional supplementation.   9.  COPD Stable. Continue Symbicort and Spiriva.  Nebs as needed.  10.  Alcohol abuse Currently stable.  Alcohol level less than 5.  Patient with no overt signs or symptoms of withdrawal.  Follow.  11.  Polysubstance abuse UDS negative.  12.  Microcytic anemia Likely dilutional.  Patient with no overt bleeding.  Patient noted to have a microcytic anemia.  Patient with failure to thrive.  No prior history of colonoscopies per patient.  May benefit from referral for ouitpatient screening colonoscopy.    13. Hypotension ?? Etiology.  Concern for probable adrenal insufficiency.  Patient noted to be hyponatremiC and also noted to have hypotension the night of 12/24/2016.  Cosyntropin stimulation study done with results pending however baseline cortisol obtained was 1 which is likely consistent with adrenal insufficiency.  Blood pressure improved on IV dexamethasone and oral fludrocortisone. IV Decadron and oral fludrocortisone have been changed to oral hydrocortisone 20 mg every morning and 10 mg nightly.  Follow.    14.  Hypocalcemia Questionable etiology.  Patient noted to have low magnesium and low phosphorus levels.  Intact PTH and calcium level and vitamin D 25-hydroxy level pending.  Calcium level improving. follow.     DVT prophylaxis: Lovenox Code Status: Full Family Communication: Updated  patient.  No family at bedside. Disposition Plan: SNF when medically stable and clinically improved.   Consultants:   None  Procedures:   Chest x-ray 12/22/2016, 12/23/2016  Antimicrobials:   None   Subjective: Patient states feeling better.  Denies shortness of breath.  Still with a chronic cough.  Patient denies any abdominal pain.  Patient asking when she is going to be able to be discharged.  Objective: Vitals:   12/27/16 0658 12/27/16 0700 12/27/16 0813 12/27/16 1414  BP: (!) 125/102 130/90  99/68  Pulse:    96  Resp:    16  Temp:    98.6 F (37 C)  TempSrc:    Oral  SpO2:   97% 100%  Weight: 35.8 kg (78 lb 14.8 oz)     Height:        Intake/Output Summary (Last 24 hours) at 12/27/2016 1648 Last data filed at 12/27/2016 1448 Gross per 24 hour  Intake 2218.75 ml  Output -  Net 2218.75 ml   Filed Weights   12/22/16 2348 12/26/16 0553 12/27/16 0658  Weight: 32.5 kg (71 lb 10.4 oz) 35.6 kg (78 lb 6.4 oz) 35.8 kg (78 lb 14.8 oz)    Examination:  General exam: Cachectic.  Frail Respiratory system: Clear to auscultation bilaterally.  No wheezes, no crackles, no rhonchi. Fair air movement, respiratory effort  normal. Cardiovascular system: S1 & S2 heard, RRR. No JVD, murmurs, rubs, gallops or clicks. No pedal edema. Gastrointestinal system: Abdomen is soft, nontender, nondistended, positive bowel sounds.  Central nervous system: Alert and oriented. No focal neurological deficits. Extremities: Symmetric 5 x 5 power. Skin: No rashes, lesions or ulcers Psychiatry: Judgement and insight appear fair. Mood & affect appropriate.     Data Reviewed: I have personally reviewed following labs and imaging studies  CBC: Recent Labs  Lab 12/22/16 1955  12/23/16 0430 12/24/16 0432 12/25/16 1223 12/26/16 0536 12/27/16 0747  WBC 6.2  --  5.4 4.0 4.7 1.9* 4.2  NEUTROABS 3.8  --   --   --  1.6* 1.2* 2.6  HGB 16.2*   < > 12.0 9.8* 9.3* 7.9* 9.9*  HCT 42.0   < > 32.2*  26.8* 25.5* 22.0* 27.2*  MCV 75.5*  --  75.2* 77.0* 78.0 77.2* 76.8*  PLT 165  --  125* 107* 118* 126* 180   < > = values in this interval not displayed.   Basic Metabolic Panel: Recent Labs  Lab 12/23/16 0430  12/24/16 0432 12/24/16 1942 12/25/16 0442 12/25/16 1223 12/26/16 0536 12/26/16 1558 12/27/16 0747  NA 128*  --  129*  --  125* 124* 132* 128* 127*  K 2.8*  --  3.6  --  4.1 3.4* 2.5* 3.1* 3.9  CL 98*  --  99*  --  93* 93* 103 91* 91*  CO2 19*  --  23  --  23 24 21* 27 27  GLUCOSE 116*  --  86  --  79 114* 103* 146* 105*  BUN 27*  --  11  --  5* 6 8 6 9   CREATININE 0.97  --  0.67  --  0.53 0.51 0.34* 0.53 0.47  CALCIUM 8.7*  --  8.1*  --  7.4* 7.6* 6.2* 8.3* 8.5*  MG 1.6*  --  2.2  --   --   --  1.0*  --  1.6*  PHOS  --    < > <1.0* 1.1* 3.2 2.2* 1.5*  --  2.0*   < > = values in this interval not displayed.   GFR: Estimated Creatinine Clearance: 42.8 mL/min (by C-G formula based on SCr of 0.47 mg/dL). Liver Function Tests: Recent Labs  Lab 12/22/16 1955 12/26/16 0536 12/27/16 0747  AST 39  --  30  ALT 21  --  12*  ALKPHOS 73  --  36*  BILITOT 1.5*  --  0.3  PROT 8.9*  --  6.1*  ALBUMIN 4.8 2.4* 3.2*   No results for input(s): LIPASE, AMYLASE in the last 168 hours. No results for input(s): AMMONIA in the last 168 hours. Coagulation Profile: No results for input(s): INR, PROTIME in the last 168 hours. Cardiac Enzymes: No results for input(s): CKTOTAL, CKMB, CKMBINDEX, TROPONINI in the last 168 hours. BNP (last 3 results) No results for input(s): PROBNP in the last 8760 hours. HbA1C: No results for input(s): HGBA1C in the last 72 hours. CBG: No results for input(s): GLUCAP in the last 168 hours. Lipid Profile: No results for input(s): CHOL, HDL, LDLCALC, TRIG, CHOLHDL, LDLDIRECT in the last 72 hours. Thyroid Function Tests: No results for input(s): TSH, T4TOTAL, FREET4, T3FREE, THYROIDAB in the last 72 hours. Anemia Panel: No results for input(s):  VITAMINB12, FOLATE, FERRITIN, TIBC, IRON, RETICCTPCT in the last 72 hours. Sepsis Labs: Recent Labs  Lab 12/22/16 2203 12/23/16 0020 12/23/16 0443 12/24/16 0657  LATICACIDVEN  2.19* 2.4* 2.6* 1.3    Recent Results (from the past 240 hour(s))  Blood Culture (routine x 2)     Status: None   Collection Time: 12/22/16  7:55 PM  Result Value Ref Range Status   Specimen Description BLOOD RIGHT FOREARM  Final   Special Requests IN PEDIATRIC BOTTLE Blood Culture adequate volume  Final   Culture   Final    NO GROWTH 5 DAYS Performed at [facility], [address], [place], [place] [address]    Report Status 12/27/2016 FINAL  Final  Blood Culture (routine x 2)     Status: None   Collection Time: 12/22/16  7:57 PM  Result Value Ref Range Status   Specimen Description BLOOD LEFT FOREARM  Final   Special Requests IN PEDIATRIC BOTTLE Blood Culture adequate volume  Final   Culture   Final    NO GROWTH 5 DAYS Performed at [facility], [address] [place], [place] [address]    Report Status 12/27/2016 FINAL  Final  Urine culture     Status: None   Collection Time: 12/22/16  9:39 PM  Result Value Ref Range Status   Specimen Description URINE, CATHETERIZED  Final   Special Requests NONE  Final   Culture   Final    NO GROWTH Performed at [facility], [address], [place], [place] [address]    Report Status 12/24/2016 FINAL  Final         Radiology Studies: No results found.      Scheduled Meds: . enoxaparin (LOVENOX) injection  20 mg Subcutaneous Q24H  . feeding supplement (ENSURE ENLIVE)  237 mL Oral TID BM  . hydrocortisone  10 mg Oral QHS  . hydrocortisone  20 mg Oral q morning - 10a  . mometasone-formoterol  2 puff Inhalation BID  . multivitamin with minerals  1 tablet Oral Daily  . phosphorus  250 mg Oral TID  . tiotropium  18 mcg Inhalation Daily   Continuous Infusions: . sodium phosphate  Dextrose 5% IVPB 10 mmol (12/27/16 1221)      LOS: 4 days    Time spent: 40 minutes    Ramiro Harvest, MD [facility] Pager [phone]  If 7PM-7AM, please contact night-coverage [email or web address] Password [facility] 12/27/2016, 4:48 PM

## 2016-12-28 ENCOUNTER — Inpatient Hospital Stay (HOSPITAL_COMMUNITY): Payer: Medicare Other

## 2016-12-28 DIAGNOSIS — E559 Vitamin D deficiency, unspecified: Secondary | ICD-10-CM | POA: Diagnosis present

## 2016-12-28 LAB — ACTH STIMULATION, 3 TIME POINTS: CORTISOL BASE: 1 ug/dL

## 2016-12-28 LAB — PHOSPHORUS: Phosphorus: 2.4 mg/dL — ABNORMAL LOW (ref 2.5–4.6)

## 2016-12-28 LAB — CBC WITH DIFFERENTIAL/PLATELET
BASOS ABS: 0 10*3/uL (ref 0.0–0.1)
Basophils Relative: 0 %
Eosinophils Absolute: 0 10*3/uL (ref 0.0–0.7)
Eosinophils Relative: 0 %
HEMATOCRIT: 27.7 % — AB (ref 36.0–46.0)
Hemoglobin: 9.9 g/dL — ABNORMAL LOW (ref 12.0–15.0)
LYMPHS PCT: 24 %
Lymphs Abs: 1.4 10*3/uL (ref 0.7–4.0)
MCH: 28.1 pg (ref 26.0–34.0)
MCHC: 35.7 g/dL (ref 30.0–36.0)
MCV: 78.7 fL (ref 78.0–100.0)
MONO ABS: 0.9 10*3/uL (ref 0.1–1.0)
Monocytes Relative: 16 %
NEUTROS ABS: 3.4 10*3/uL (ref 1.7–7.7)
Neutrophils Relative %: 60 %
Platelets: 226 10*3/uL (ref 150–400)
RBC: 3.52 MIL/uL — AB (ref 3.87–5.11)
RDW: 13.9 % (ref 11.5–15.5)
WBC: 5.7 10*3/uL (ref 4.0–10.5)

## 2016-12-28 LAB — BASIC METABOLIC PANEL
ANION GAP: 11 (ref 5–15)
BUN: 16 mg/dL (ref 6–20)
CHLORIDE: 87 mmol/L — AB (ref 101–111)
CO2: 32 mmol/L (ref 22–32)
Calcium: 8.4 mg/dL — ABNORMAL LOW (ref 8.9–10.3)
Creatinine, Ser: 0.56 mg/dL (ref 0.44–1.00)
GFR calc Af Amer: >60 mL/min (ref >60)
GLUCOSE: 117 mg/dL — AB (ref 65–99)
POTASSIUM: 3 mmol/L — AB (ref 3.5–5.1)
Sodium: 130 mmol/L — ABNORMAL LOW (ref 135–145)

## 2016-12-28 LAB — MAGNESIUM: MAGNESIUM: 1.8 mg/dL (ref 1.7–2.4)

## 2016-12-28 LAB — PTH, INTACT AND CALCIUM
CALCIUM TOTAL (PTH): 8.1 mg/dL — AB (ref 8.7–10.2)
PTH: 35 pg/mL (ref 15–65)

## 2016-12-28 LAB — VITAMIN D 25 HYDROXY (VIT D DEFICIENCY, FRACTURES): Vit D, 25-Hydroxy: 17.4 ng/mL — ABNORMAL LOW (ref 30.0–100.0)

## 2016-12-28 MED ORDER — HYDROCORTISONE NICU INJ SYRINGE 50 MG/ML: 50.0000 mg | Freq: Three times a day (TID) | INTRAVENOUS | Status: DC

## 2016-12-28 MED ORDER — FLUDROCORTISONE ACETATE 0.1 MG PO TABS
0.0500 mg | ORAL_TABLET | Freq: Every day | ORAL | Status: DC
  Filled 2016-12-28: qty 0.5

## 2016-12-28 MED ORDER — POTASSIUM CHLORIDE CRYS ER 20 MEQ PO TBCR
40.0000 meq | EXTENDED_RELEASE_TABLET | ORAL | Status: AC
  Administered 2016-12-28 (×2): 40 meq via ORAL
  Filled 2016-12-28 (×2): qty 2

## 2016-12-28 MED ORDER — VITAMIN D3 25 MCG (1000 UNIT) PO TABS
1000.0000 [IU] | ORAL_TABLET | Freq: Every day | ORAL | Status: DC
  Administered 2016-12-28 – 2017-01-01 (×5): 1000 [IU] via ORAL
  Filled 2016-12-28 (×5): qty 1

## 2016-12-28 MED ORDER — HYDROCORTISONE NA SUCCINATE PF 100 MG IJ SOLR
50.0000 mg | Freq: Three times a day (TID) | INTRAMUSCULAR | Status: DC
  Administered 2016-12-28 – 2016-12-29 (×3): 50 mg via INTRAVENOUS
  Filled 2016-12-28 (×3): qty 2

## 2016-12-28 NOTE — Progress Notes (Signed)
Physical Therapy Treatment Patient Details Name: Jennifer Hurley MRN: [id number] DOB: 03-01-1955 Today's Date: 12/28/2016    History of Present Illness 61 y.o. female with medical history significant of ETOH and polysubstance abuse, FTT, COPD, RA, MI and found to have hyponatremia, dehyration, AKI and admitted for failure to thrive    PT Comments    Ambulated 8 feet with min assist for safety with no AD VERY UNSTEADY GAIT, then went 12 feet with RW, see below. Patient had c/o "I'm cold" and "I'm tired" throughout session that limited mobility. Patient stated having no one to help her at home, that her daughter was too busy and had her own family, and that when she needs to go to the grocery store she calls someone to take her or takes the bus.   Follow Up Recommendations  1.   SNF 2.   Assistance - 24/7 hour     Equipment Recommendations  None recommended by PT    Recommendations for Other Services       Precautions / Restrictions Precautions Precautions: Fall Restrictions Weight Bearing Restrictions: No    Mobility  Bed Mobility Overal bed mobility: Needs Assistance Bed Mobility: Supine to Sit;Sit to Supine     Supine to sit: Supervision Sit to supine: Supervision   General bed mobility comments: required increased time, supervision for safety  Transfers Overall transfer level: Needs assistance Equipment used: Rolling walker (2 wheeled) Transfers: Sit to/from Stand Sit to Stand: Min guard;Supervision         General transfer comment: required min guard for safety, patient able to tolerate standing while taking vitals, but reported fatigue and went back into sitting.  Ambulation/Gait Ambulation/Gait assistance: Min guard;Supervision Ambulation Distance (Feet): 20 Feet Assistive device: Rolling walker (2 wheeled);None Gait Pattern/deviations: Step-through pattern;Step-to pattern;Shuffle;Decreased stride length;Drifts right/left;Narrow base of support Gait  velocity: decreased Gait velocity interpretation: Below normal speed for age/gender General Gait Details: went 8 feet with no assistive device, and required min assis for safety, displayed drifitng right and left, with a narrow BOS, and small steps. HIGH FALL RISK. Ambulated 12 feet back to room with rolling walker, displayed a longer step length with no drifting, patient ambulated more confidentally with RW.   Stairs            Wheelchair Mobility    Modified Rankin (Stroke Patients Only)       Balance                                            Cognition Arousal/Alertness: Awake/alert Behavior During Therapy: WFL for tasks assessed/performed Overall Cognitive Status: No family/caregiver present to determine baseline cognitive functioning                                 General Comments: stated being cold during session, stated having fatigue      Exercises      General Comments        Pertinent Vitals/Pain Pain Assessment: 0-10 Pain Score: 9  Pain Location: Back Pain Descriptors / Indicators: Discomfort;Aching Pain Intervention(s): Repositioned;Limited activity within patient's tolerance;Monitored during session    Home Living                      Prior Function  PT Goals (current goals can now be found in the care plan section)      Frequency    Min 3X/week      PT Plan Current plan remains appropriate    Co-evaluation              AM-PAC PT "6 Clicks" Daily Activity  Outcome Measure  Difficulty turning over in bed (including adjusting bedclothes, sheets and blankets)?: None Difficulty moving from lying on back to sitting on the side of the bed? : None Difficulty sitting down on and standing up from a chair with arms (e.g., wheelchair, bedside commode, etc,.)?: Unable Help needed moving to and from a bed to chair (including a wheelchair)?: A Little Help needed walking in hospital  room?: A Lot Help needed climbing 3-5 steps with a railing? : A Lot 6 Click Score: 16    End of Session Equipment Utilized During Treatment: Gait belt Activity Tolerance: Patient limited by fatigue;Other (comment)(limited due to c/o being cold) Patient left: in chair;with chair alarm set;with call bell/phone within reach   PT Visit Diagnosis: Other abnormalities of gait and mobility (R26.89)     Time:  - 10:00 - 10:25    Charges:    1 gt   1 ta                   G Codes:       Stephens November, SPTA [facility] [phone]  Felecia Shelling  PTA [facility] Pager      [phone]

## 2016-12-28 NOTE — Progress Notes (Signed)
PROGRESS NOTE    Jennifer Hurley  GYJ:[id number] DOB: [birth year] DOA: 12/22/2016 PCP: No primary care provider on file.    Brief Narrative:  [name] is a 61 y.o. female with medical history significant of etoh and polysubstance abuse, FTT, copd comes in after family found her at home not eating and drinking very well.  Pt denies any pain anywhere.  She denies any n/v/d although this was reported earlier to ED staff.  She denies any fevers.  Pt found to have hyponatremia and aki and dehydration and referred for admission for such.  She was initially with low bp and then with high bp in ED.  Her ROS is grossly negative.     Assessment & Plan:   Principal Problem:   Failure to thrive (0-17) Active Problems:   Hypotension   Adrenal insufficiency (Addison's disease) (HCC)   Essential hypertension   COPD with emphysema (HCC)   Hypokalemia   Systolic CHF, chronic (HCC)   Severe protein-calorie malnutrition (HCC)   Tobacco abuse   Cocaine abuse (HCC)   Alcohol abuse   Acute kidney injury (HCC)   Dehydration   AKI (acute kidney injury) (HCC)   FTT (failure to thrive) in adult   Hypomagnesemia   Pressure injury of skin   Cough   Vitamin D deficiency  #1 Failure to thrive Questionable etiology.  Noted to be chronic and long-standing.  May be secondary adrenal insufficiency.   Patient with no signs or symptoms of infection.  Urinalysis unremarkable.  Chest x-ray negative.  Blood cultures with no growth to date.  Patient is afebrile.  Normal white count.  Hemoglobin was elevated on admission likely secondary to significant dehydration and has trended down.   TSH within normal limits.  Patient with no overt bleeding.  Patient status post cosyntropin stim test 12/26/2016.  Initial cortisol level at 1.  Consistent with adrenal insufficiency.  Patient was on IV dexamethasone and fludrocortisone with improvement in blood pressure and hyponatremia.  Changed IV dexamethasone and  fludrocortisone to oral hydrocortisone 20 mg in the morning and 10 mg at night.  She is noted to have some hypotension today and as such we will discontinue oral hydrocortisone and place back on IV hydrocortisone and titrate back to oral dose once blood pressure issues have improved.  Saline lock IV fluids.  Patient with no prior history of colonoscopy.  Patient noted to have a microcytic anemia.  May benefit from outpatient referral for screening colonoscopy.  #2 adrenal insufficiency Patient noted to be hypotensive, hyponatremic, hypokalemic with hypomagnesemia.  Concern for adrenal insufficiency.  Cosyntropin stimulation test done.  Baseline cortisol level was 1.  Improvement with blood pressure in hyponatremia on IV dexamethasone and fludrocortisone.  Discontinued IV dexamethasone and fludrocortisone and started on oral hydrocortisone 20 mg in the morning and 10 mg at night.  Patient however now with some hypotension and as such we will discontinue oral hydrocortisone and placed on IV hydrocortisone 50 mg every 8 hours and follow and titrate back to oral hydrocortisone once blood pressure issues have improved.  Patient will likely need outpatient follow-up with endocrinology, [name].  Office will call with an appointment..  3.  Acute kidney injury Likely secondary to a prerenal azotemia as patient was noted to be hypotensive on admission with systolic blood pressures in the 70s.  Urinalysis negative nitrite, negative leukocytes, negative proteins.  Renal function improved with hydration.  Follow.   4.  Cough Questionable etiology.  Repeat chest  x-ray negative for any acute infiltrates.  Cough improving.  Hycodan as needed.  Patient has been seen in consultation by speech therapy regular diet recommended with thin liquids.    5.  Severe dehydration Likely secondary to problem #1.  Treated with IV fluids.  Likely euvolemic now.  Saline lock IV fluids.  Supportive care.  6.   Hypokalemia/hypomagnesemia Replete.  7.  Hyponatremia Patient presented with a significant hyponatremia noted to be hypotensive on admission.  Likely adrenal insufficiency.  Likely secondary to hypovolemic hyponatremia.  Sodium levels initially improved with hydration however hyponatremia has worsened this morning.  Patient also noted to be hypotensive the night of 12/24/2016, with low blood pressures with systolics in the 70s-80s.  TSH within normal limits at 1.506.  Random cortisol obtained was 7.7.  Cosyntropin stim test pending with initial cortisol level 12/26/2016 at 1 likely secondary to adrenal insufficiency.  Hyponatremia improved with IV dexamethasone and fludrocortisone.  Hyponatremia fluctuating.  IV dexamethasone and fludrocortisone have been changed to oral hydrocortisone 20 mg in the morning and 10 mg at night.  Saline lock IV fluids.  Hyponatremia improved after dose of IV Lasix x1.  Due to low blood pressure will place patient back on IV hydrocortisone 50 mg every 8 hours.  Follow.    8.  Severe protein calorie malnutrition Nutrition following.  Phosphorus level is significantly low and less than 1.0 which was repleted per pharmacy.  Phosphorus levels still low and will replete.  Nutritional supplementation.   9.  COPD Continue Symbicort and Spiriva.  Nebs as needed.  10.  Alcohol abuse Currently stable.  Alcohol level less than 5.  Patient with no overt signs or symptoms of withdrawal.  Follow.  11.  Polysubstance abuse UDS negative.  12.  Microcytic anemia Likely dilutional.  Patient with no overt bleeding.  Patient noted to have a microcytic anemia.  Patient with failure to thrive.  No prior history of colonoscopies per patient.  May benefit from referral for ouitpatient screening colonoscopy.    13. Hypotension ?? Etiology.  Concern for probable adrenal insufficiency.  Patient noted to be hyponatremiC and also noted to have hypotension the night of 12/24/2016.  Cosyntropin  stimulation study done with results pending however baseline cortisol obtained was 1 which is likely consistent with adrenal insufficiency.  Blood pressure improved on IV dexamethasone and oral fludrocortisone. IV Decadron and oral fludrocortisone have been changed to oral hydrocortisone 20 mg every morning and 10 mg nightly.  With change to hydrocortisone patient noted to have low blood pressures when supine with systolic in the 80s.  Will discontinue oral hydrocortisone for now and place on IV hydrocortisone 50 mg every 8 hours and follow.  Once blood pressure improves will try to titrate back down to oral hydrocortisone twice daily.    14.  Hypocalcemia Questionable etiology.  Patient noted to have low magnesium and low phosphorus levels.  Intact PTH and calcium level and vitamin D 25-hydroxy level pending.  Calcium level improving. Follow.  #15 vitamin D deficiency Place on vitamin D 1000 units daily.     DVT prophylaxis: Lovenox Code Status: Full Family Communication: Updated patient and daughter at bedside. Disposition Plan: SNF when medically stable and clinically improved.   Consultants:   None  Procedures:   Chest x-ray 12/22/2016, 12/23/2016  Antimicrobials:   None   Subjective: Patient sitting up in chair with complaints of shortness of breath.  Patient denies any chest pain.  Patient also with some complaints of  lightheadedness.  Patient noted to have systolic blood pressures in the 80s when laying supine and when standing systolic blood pressures in the low 100s.    Objective: Vitals:   12/28/16 1036 12/28/16 1120 12/28/16 1121 12/28/16 1150  BP:  (!) 88/60 (!) 88/50 (!) 89/60  Pulse:  90    Resp:  20    Temp:      TempSrc:      SpO2: 100% 97%  97%  Weight:      Height:        Intake/Output Summary (Last 24 hours) at 12/28/2016 1212 Last data filed at 12/28/2016 0940 Gross per 24 hour  Intake 460 ml  Output -  Net 460 ml   Filed Weights   12/26/16  0553 12/27/16 0658 12/28/16 0454  Weight: 35.6 kg (78 lb 6.4 oz) 35.8 kg (78 lb 14.8 oz) 34.9 kg (76 lb 14.4 oz)    Examination:  General exam: Cachectic.  Frail Respiratory system: Clear to auscultation bilaterally.  No wheezes, no crackles, no rhonchi. Fair air movement, respiratory effort normal. Cardiovascular system: S1 & S2 heard, RRR. No JVD, murmurs, rubs, gallops or clicks. No pedal edema. Gastrointestinal system: Abdomen is soft, nontender, nondistended, positive bowel sounds.  Central nervous system: Alert and oriented. No focal neurological deficits. Extremities: Symmetric 5 x 5 power. Skin: No rashes, lesions or ulcers Psychiatry: Judgement and insight appear fair. Mood & affect appropriate.     Data Reviewed: I have personally reviewed following labs and imaging studies  CBC: Recent Labs  Lab 12/22/16 1955  12/24/16 0432 12/25/16 1223 12/26/16 0536 12/27/16 0747 12/28/16 0523  WBC 6.2   < > 4.0 4.7 1.9* 4.2 5.7  NEUTROABS 3.8  --   --  1.6* 1.2* 2.6 3.4  HGB 16.2*   < > 9.8* 9.3* 7.9* 9.9* 9.9*  HCT 42.0   < > 26.8* 25.5* 22.0* 27.2* 27.7*  MCV 75.5*   < > 77.0* 78.0 77.2* 76.8* 78.7  PLT 165   < > 107* 118* 126* 180 226   < > = values in this interval not displayed.   Basic Metabolic Panel: Recent Labs  Lab 12/23/16 0430 12/24/16 0432  12/25/16 0442 12/25/16 1223 12/26/16 0536 12/26/16 1558 12/27/16 0747 12/28/16 0523  NA 128* 129*  --  125* 124* 132* 128* 127* 130*  K 2.8* 3.6  --  4.1 3.4* 2.5* 3.1* 3.9 3.0*  CL 98* 99*  --  93* 93* 103 91* 91* 87*  CO2 19* 23  --  23 24 21* 27 27 32  GLUCOSE 116* 86  --  79 114* 103* 146* 105* 117*  BUN 27* 11  --  5* 6 8 6 9 16   CREATININE 0.97 0.67  --  0.53 0.51 0.34* 0.53 0.47 0.56  CALCIUM 8.7* 8.1*  --  7.4* 7.6* 6.2* 8.3* 8.5* 8.4*  MG 1.6* 2.2  --   --   --  1.0*  --  1.6* 1.8  PHOS  --  <1.0*   < > 3.2 2.2* 1.5*  --  2.0* 2.4*   < > = values in this interval not displayed.   GFR: Estimated  Creatinine Clearance: 41.7 mL/min (by C-G formula based on SCr of 0.56 mg/dL). Liver Function Tests: Recent Labs  Lab 12/22/16 1955 12/26/16 0536 12/27/16 0747  AST 39  --  30  ALT 21  --  12*  ALKPHOS 73  --  36*  BILITOT 1.5*  --  0.3  PROT 8.9*  --  6.1*  ALBUMIN 4.8 2.4* 3.2*   No results for input(s): LIPASE, AMYLASE in the last 168 hours. No results for input(s): AMMONIA in the last 168 hours. Coagulation Profile: No results for input(s): INR, PROTIME in the last 168 hours. Cardiac Enzymes: No results for input(s): CKTOTAL, CKMB, CKMBINDEX, TROPONINI in the last 168 hours. BNP (last 3 results) No results for input(s): PROBNP in the last 8760 hours. HbA1C: No results for input(s): HGBA1C in the last 72 hours. CBG: No results for input(s): GLUCAP in the last 168 hours. Lipid Profile: No results for input(s): CHOL, HDL, LDLCALC, TRIG, CHOLHDL, LDLDIRECT in the last 72 hours. Thyroid Function Tests: No results for input(s): TSH, T4TOTAL, FREET4, T3FREE, THYROIDAB in the last 72 hours. Anemia Panel: No results for input(s): VITAMINB12, FOLATE, FERRITIN, TIBC, IRON, RETICCTPCT in the last 72 hours. Sepsis Labs: Recent Labs  Lab 12/22/16 2203 12/23/16 0020 12/23/16 0443 12/24/16 0657  LATICACIDVEN 2.19* 2.4* 2.6* 1.3    Recent Results (from the past 240 hour(s))  Blood Culture (routine x 2)     Status: None   Collection Time: 12/22/16  7:55 PM  Result Value Ref Range Status   Specimen Description BLOOD RIGHT FOREARM  Final   Special Requests IN PEDIATRIC BOTTLE Blood Culture adequate volume  Final   Culture   Final    NO GROWTH 5 DAYS Performed at [facility], [address] [place], [place] [address]    Report Status 12/27/2016 FINAL  Final  Blood Culture (routine x 2)     Status: None   Collection Time: 12/22/16  7:57 PM  Result Value Ref Range Status   Specimen Description BLOOD LEFT FOREARM  Final   Special Requests IN PEDIATRIC BOTTLE Blood Culture  adequate volume  Final   Culture   Final    NO GROWTH 5 DAYS Performed at [facility], [address] [place], [place] [address]    Report Status 12/27/2016 FINAL  Final  Urine culture     Status: None   Collection Time: 12/22/16  9:39 PM  Result Value Ref Range Status   Specimen Description URINE, CATHETERIZED  Final   Special Requests NONE  Final   Culture   Final    NO GROWTH Performed at [facility], [address] [place], [place] [address]    Report Status 12/24/2016 FINAL  Final         Radiology Studies: No results found.      Scheduled Meds: . cholecalciferol  1,000 Units Oral Daily  . enoxaparin (LOVENOX) injection  20 mg Subcutaneous Q24H  . feeding supplement (ENSURE ENLIVE)  237 mL Oral TID BM  . fludrocortisone  0.05 mg Oral Daily  . hydrocortisone  10 mg Oral QHS  . hydrocortisone  20 mg Oral q morning - 10a  . mometasone-formoterol  2 puff Inhalation BID  . multivitamin with minerals  1 tablet Oral Daily  . phosphorus  250 mg Oral TID  . potassium chloride  40 mEq Oral Q4H  . tiotropium  18 mcg Inhalation Daily   Continuous Infusions:    LOS: 5 days    Time spent: 40 minutes    Ramiro Harvestaniel Oberon Hehir, MD [facility] Pager [phone]  If 7PM-7AM, please contact night-coverage [email or web address] Password [email or web address] 12/28/2016, 12:12 PM

## 2016-12-28 NOTE — Care Management Important Message (Signed)
Important Message  Patient Details  Name: Jennifer Hurley MRN: [id number] Date of Birth: 1955-03-19   Medicare Important Message Given:  Yes    Caren Macadam 12/28/2016, 10:53 AMImportant Message  Patient Details  Name: Jennifer Hurley MRN: [id number] Date of Birth: [birth year]   Medicare Important Message Given:  Yes    Caren Macadam 12/28/2016, 10:53 AM

## 2016-12-28 NOTE — Progress Notes (Signed)
Pharmacy: Phosphorus replacement  Patient is a 61 y.o F with failure to thrive, presented to the ED on 11/13 d/t poor oral intake.  Patient was found to have electrolytes abnormalities including low phosphorus.  - 11/15 at 0432: phos level <1 (Kphos 20 mmol x1 at 0621 - 0.6 mmol/kg)             Na low, K 3.6, scr ok, Ca slightly low at 8.1             At 1942  phos level 1.1 (kphos 20 mmol x1 given at 2315) - 11/16 at 0442: phos level  Therapeutic 3.2 (drawn too early, need to draw at least 6 hrs after infusion-- infusion still running when drawn) Na low 125, K 4.1 -11/16 at 1223: phos level drawn ~6 hrs after completion of PM dose on 11/15 now back low at 2.2  -11/17 @0536 : Phos has dropped to 1.5, Na 132, K+2.5, Magnesium 1.0, CorrCa 7.48 -11/18 @0747 : Phos slightly low at 2.0, Na 127, K+ 3.9, Mag 1.6, CorrCa WNL - 11/19 at 0523: phos up from 2 to 2.4 with K phos neutral dose increased to 250 mg TID + Na phos per MD on 11/18 , K low 3- MD repleting, Na low but up to 130, Mag 1.8, CorrCa wnl  Assessment/Plan: - continue phosphorus tablet to TID  - F/U phosphorus labs in AM  , PharmD, BCPS 12/28/2016 11:13 AM

## 2016-12-28 NOTE — Progress Notes (Signed)
  Speech Language Pathology Treatment: Dysphagia  Patient Details Name: Jennifer Hurley MRN: [id number] DOB: [birth year] Today's Date: 12/28/2016 Time: 1340-1350 SLP Time Calculation (min) (ACUTE ONLY): 10 min  Assessment / Plan / Recommendation Clinical Impression  Intake of today's lunch was approximately 90% and she appears in brighter spirits today.    Pt denies difficulty with swallowing/masticating and family had brought in denture adhesive for her to use. SLP educated her to importance of cleaning her dentures/gums/tongue daily for pulmonary health.  Pt with good intake, recommend continue regular/thin diet with general precautions.    Note pt for dc to [facility] when medically ready.  As she had not had a neurological change - eg stroke, is able to communicate with staff adequately and is tolerating po intake, no SLP follow up indicated.  Education completed using teach back.   HPI HPI: Ptis a 61 y.o.femalewith medical history significant ofetoh and polysubstance abuse, FTT, COPD, MI, and PNA, who comes in after family found her at home with AMS, not eating and drinking very well.       SLP Plan  All goals met       Recommendations  Diet recommendations: Regular;Thin liquid Liquids provided via: Cup;Straw Medication Administration: Whole meds with liquid Supervision: Patient able to self feed Compensations: Slow rate;Small sips/bites Postural Changes and/or Swallow Maneuvers: Seated upright 90 degrees;Upright 30-60 min after meal                Oral Care Recommendations: Oral care BID(clean dentures every day, use for all meals) SLP Visit Diagnosis: Dysphagia, unspecified (R13.10) Plan: All goals met       GO               Luanna Salk, MS [facility] SLP [phone]  Macario Golds 12/28/2016, 2:05 PM

## 2016-12-28 NOTE — Progress Notes (Signed)
CSW confirmed pt's SNF selection, [facility]. Will follow and assist with transition there at DC.   Ilean Skill, MSW, LCSW Clinical Social Work 12/28/2016 [phone]

## 2016-12-28 NOTE — Progress Notes (Signed)
Patients c/o increased SOB and feeling hot. Pt denied CP, dizziness and or weakness. VS assessed, manual BP 88/50 right and 88/60 left arm. Provider contacted.

## 2016-12-29 DIAGNOSIS — E559 Vitamin D deficiency, unspecified: Secondary | ICD-10-CM

## 2016-12-29 LAB — CBC WITH DIFFERENTIAL/PLATELET
BASOS ABS: 0 10*3/uL (ref 0.0–0.1)
BASOS PCT: 0 %
EOS PCT: 0 %
Eosinophils Absolute: 0 10*3/uL (ref 0.0–0.7)
HCT: 27.5 % — ABNORMAL LOW (ref 36.0–46.0)
Hemoglobin: 9.8 g/dL — ABNORMAL LOW (ref 12.0–15.0)
LYMPHS PCT: 21 %
Lymphs Abs: 1.2 10*3/uL (ref 0.7–4.0)
MCH: 28.2 pg (ref 26.0–34.0)
MCHC: 35.6 g/dL (ref 30.0–36.0)
MCV: 79.3 fL (ref 78.0–100.0)
MONO ABS: 0.5 10*3/uL (ref 0.1–1.0)
Monocytes Relative: 10 %
Neutro Abs: 3.9 10*3/uL (ref 1.7–7.7)
Neutrophils Relative %: 69 %
PLATELETS: 235 10*3/uL (ref 150–400)
RBC: 3.47 MIL/uL — ABNORMAL LOW (ref 3.87–5.11)
RDW: 14 % (ref 11.5–15.5)
WBC: 5.7 10*3/uL (ref 4.0–10.5)

## 2016-12-29 LAB — BASIC METABOLIC PANEL
Anion gap: 10 (ref 5–15)
BUN: 18 mg/dL (ref 6–20)
CO2: 32 mmol/L (ref 22–32)
Calcium: 8.5 mg/dL — ABNORMAL LOW (ref 8.9–10.3)
Chloride: 87 mmol/L — ABNORMAL LOW (ref 101–111)
Creatinine, Ser: 0.51 mg/dL (ref 0.44–1.00)
GFR calc Af Amer: >60 mL/min (ref >60)
GLUCOSE: 116 mg/dL — AB (ref 65–99)
Potassium: 3.5 mmol/L (ref 3.5–5.1)
Sodium: 129 mmol/L — ABNORMAL LOW (ref 135–145)

## 2016-12-29 LAB — PHOSPHORUS: PHOSPHORUS: 2.6 mg/dL (ref 2.5–4.6)

## 2016-12-29 MED ORDER — POTASSIUM CHLORIDE CRYS ER 20 MEQ PO TBCR
40.0000 meq | EXTENDED_RELEASE_TABLET | ORAL | Status: AC
  Administered 2016-12-29 (×2): 40 meq via ORAL
  Filled 2016-12-29 (×2): qty 2

## 2016-12-29 MED ORDER — FUROSEMIDE 10 MG/ML IJ SOLN: 20.0000 mg | Freq: Once | INTRAMUSCULAR | Status: DC

## 2016-12-29 MED ORDER — FUROSEMIDE 10 MG/ML IJ SOLN
20.0000 mg | Freq: Two times a day (BID) | INTRAMUSCULAR | Status: AC
  Administered 2016-12-29 (×2): 20 mg via INTRAVENOUS
  Filled 2016-12-29 (×2): qty 2

## 2016-12-29 MED ORDER — HYDROCORTISONE NA SUCCINATE PF 100 MG IJ SOLR
50.0000 mg | Freq: Two times a day (BID) | INTRAMUSCULAR | Status: DC
  Administered 2016-12-29 – 2016-12-31 (×4): 50 mg via INTRAVENOUS
  Filled 2016-12-29 (×4): qty 2

## 2016-12-29 NOTE — Progress Notes (Signed)
PROGRESS NOTE    Jennifer Hurley  XBJ:[id number] DOB: Jan 17, 1956 DOA: 12/22/2016 PCP: No primary care provider on file.    Brief Narrative:  Jennifer LoftsMamie L Rolison is a 61 y.o. female with medical history significant of etoh and polysubstance abuse, FTT, copd comes in after family found her at home not eating and drinking very well.  Pt denies any pain anywhere.  She denies any n/v/d although this was reported earlier to ED staff.  She denies any fevers.  Pt found to have hyponatremia and aki and dehydration and referred for admission for such.  She was initially with low bp and then with high bp in ED.  Her ROS is grossly negative. Was admitted and worked up noted to likely have an adrenal insufficiency.  Patient started on oral hydrocortisone however due to hypotension and hyponatremia patient was placed back on IV hydrocortisone 50 mg every 8 hours.     Assessment & Plan:   Principal Problem:   Failure to thrive (0-17) Active Problems:   Hypotension   Adrenal insufficiency (Addison's disease) (HCC)   Essential hypertension   COPD with emphysema (HCC)   Hypokalemia   Systolic CHF, chronic (HCC)   Severe protein-calorie malnutrition (HCC)   Tobacco abuse   Cocaine abuse (HCC)   Alcohol abuse   Acute kidney injury (HCC)   Dehydration   AKI (acute kidney injury) (HCC)   FTT (failure to thrive) in adult   Hypomagnesemia   Pressure injury of skin   Cough   Vitamin D deficiency  #1 Failure to thrive Questionable etiology.  Noted to be chronic and long-standing.  May be secondary adrenal insufficiency.   Patient with no signs or symptoms of infection.  Urinalysis unremarkable.  Chest x-ray negative.  Blood cultures with no growth to date.  Patient is afebrile.  Normal white count.  Hemoglobin was elevated on admission likely secondary to significant dehydration and has trended down.   TSH within normal limits.  Patient with no overt bleeding.  Patient status post cosyntropin stim test  12/26/2016.  Initial cortisol level at 1.  Consistent with adrenal insufficiency.  Patient was on IV dexamethasone and fludrocortisone with improvement in blood pressure and hyponatremia.  Changed IV dexamethasone and fludrocortisone to oral hydrocortisone 20 mg in the morning and 10 mg at night.  She is noted to have some hypotension 12/28/2016, and as such discontinued oral hydrocortisone and place back on IV hydrocortisone and titrate back to oral dose once blood pressure issues have improved.  Saline lock IV fluids.  Patient with no prior history of colonoscopy.  Patient noted to have a microcytic anemia.  May benefit from outpatient referral for screening colonoscopy.  #2 adrenal insufficiency Patient noted to be hypotensive, hyponatremic, hypokalemic with hypomagnesemia.  Concern for adrenal insufficiency.  Cosyntropin stimulation test done.  Baseline cortisol level was 1.  Improvement with blood pressure in hyponatremia on IV dexamethasone and fludrocortisone.  Discontinued IV dexamethasone and fludrocortisone and started on oral hydrocortisone 20 mg in the morning and 10 mg at night.  Patient however now with some hypotension and as such discontinued oral hydrocortisone and placed on IV hydrocortisone 50 mg every 8 hours and follow and titrate back to oral hydrocortisone once blood pressure issues have improved.  Patient will likely need outpatient follow-up with endocrinology, Dr. Sharl MaKerr.  Office will call with an appointment..  3.  Acute kidney injury Likely secondary to a prerenal azotemia as patient was noted to be hypotensive on admission with  systolic blood pressures in the 70s.  Urinalysis negative nitrite, negative leukocytes, negative proteins.  Renal function improved with hydration.  Follow.   4.  Cough Questionable etiology.  Repeat chest x-ray negative for any acute infiltrates.  Cough improving.  Hycodan as needed.  Patient has been seen in consultation by speech therapy regular diet  recommended with thin liquids.    5.  Severe dehydration Likely secondary to problem #1.  Treated with IV fluids.  Likely euvolemic now.  Saline lock IV fluids.  Supportive care.  6.  Hypokalemia/hypomagnesemia Replete.  7.  Hyponatremia Patient presented with a significant hyponatremia noted to be hypotensive on admission.  Likely adrenal insufficiency.  Likely secondary to hypovolemic hyponatremia.  Sodium levels initially improved with hydration however hyponatremia has worsened this morning.  Patient also noted to be hypotensive the night of 12/24/2016, with low blood pressures with systolics in the 70s-80s.  TSH within normal limits at 1.506.  Random cortisol obtained was 7.7.  Cosyntropin stim test pending with initial cortisol level 12/26/2016 at 1 likely secondary to adrenal insufficiency.  Hyponatremia improved with IV dexamethasone and fludrocortisone.  Hyponatremia fluctuating.  IV dexamethasone and fludrocortisone have been changed to oral hydrocortisone 20 mg in the morning and 10 mg at night.  Saline lock IV fluids.  Hyponatremia improved after dose of IV Lasix x1.  Due to low blood pressure patient placed back on IV hydrocortisone 50 mg every 8 hours.  Patient also noted to have a weight of 82 pounds today from 71 pounds on admission.  Will give Lasix 20 mg IV x2 doses and follow.    8.  Severe protein calorie malnutrition Nutrition following.  Phosphorus level is significantly low and less than 1.0 which was repleted per pharmacy.  Continue oral phosphorus supplementation. Nutritional supplementation.   9.  COPD Continue Symbicort and Spiriva.  Nebs as needed.  10.  Alcohol abuse Currently stable.  Alcohol level less than 5.  Patient with no overt signs or symptoms of withdrawal.  Follow.  11.  Polysubstance abuse UDS negative.  12.  Microcytic anemia Likely dilutional.  Patient with no overt bleeding.  Patient noted to have a microcytic anemia.  Patient with failure to  thrive.  No prior history of colonoscopies per patient.  May benefit from referral for ouitpatient screening colonoscopy.    13. Hypotension ?? Etiology.  Concern for probable adrenal insufficiency.  Patient noted to be hyponatremiC and also noted to have hypotension the night of 12/24/2016.  Cosyntropin stimulation study done with results pending however baseline cortisol obtained was 1 which is likely consistent with adrenal insufficiency.  Blood pressure improved on IV dexamethasone and oral fludrocortisone. IV Decadron and oral fludrocortisone have been changed to oral hydrocortisone 20 mg every morning and 10 mg nightly.  With change to hydrocortisone patient noted to have low blood pressures when supine with systolic in the 80s.  Discontinued oral hydrocortisone for now and started on IV hydrocortisone 50 mg every 8 hours.  Blood pressure improved.  Taper IV hydrocortisone back to oral hydrocortisone twice daily.     14.  Hypocalcemia Questionable etiology.  Patient noted to have low magnesium and low phosphorus levels.  Intact PTH within normal limits at 35, and vitamin D 25-hydroxy level low.  Vitamin D is being repleted.  Corrected calcium is 9.14. Follow.  #15 vitamin D deficiency Continue vitamin D 1000 units daily.     DVT prophylaxis: Lovenox Code Status: Full Family Communication: Updated patient.  No  family at bedside.   Disposition Plan: SNF when medically stable and clinically improved and on oral hydrocortisone..   Consultants:   None  Procedures:   Chest x-ray 12/22/2016, 12/23/2016  Antimicrobials:   None   Subjective: Patient laying in bed.  Denies any further lightheadedness.  No dizziness.  Patient denies any shortness of breath or chest pain.    Objective: Vitals:   12/28/16 2010 12/28/16 2038 12/29/16 0502 12/29/16 0759  BP:  109/81 126/80   Pulse:  (!) 102 90   Resp:  16 18   Temp:  98.5 F (36.9 C) 97.8 F (36.6 C)   TempSrc:  Oral Oral   SpO2:  96% 100% 98% 99%  Weight:   37.4 kg (82 lb 6.4 oz)   Height:        Intake/Output Summary (Last 24 hours) at 12/29/2016 1049 Last data filed at 12/29/2016 1012 Gross per 24 hour  Intake 600 ml  Output 200 ml  Net 400 ml   Filed Weights   12/27/16 0658 12/28/16 0454 12/29/16 0502  Weight: 35.8 kg (78 lb 14.8 oz) 34.9 kg (76 lb 14.4 oz) 37.4 kg (82 lb 6.4 oz)    Examination:  General exam: Cachectic.  Frail Respiratory system: Clear to auscultation bilaterally.  No wheezes, no crackles, no rhonchi. Fair air movement, respiratory effort normal. Cardiovascular system: Regular rate and rhythm no murmurs rubs or gallops.  No lower extremity edema.   Gastrointestinal system: Abdomen is soft, nontender, nondistended, positive bowel sounds.  Central nervous system: Alert and oriented. No focal neurological deficits. Extremities: Symmetric 5 x 5 power. Skin: No rashes, lesions or ulcers Psychiatry: Judgement and insight appear fair. Mood & affect appropriate.     Data Reviewed: I have personally reviewed following labs and imaging studies  CBC: Recent Labs  Lab 12/25/16 1223 12/26/16 0536 12/27/16 0747 12/28/16 0523 12/29/16 0452  WBC 4.7 1.9* 4.2 5.7 5.7  NEUTROABS 1.6* 1.2* 2.6 3.4 3.9  HGB 9.3* 7.9* 9.9* 9.9* 9.8*  HCT 25.5* 22.0* 27.2* 27.7* 27.5*  MCV 78.0 77.2* 76.8* 78.7 79.3  PLT 118* 126* 180 226 235   Basic Metabolic Panel: Recent Labs  Lab 12/23/16 0430 12/24/16 0432  12/25/16 1223 12/26/16 0536 12/26/16 1558 12/27/16 0747 12/28/16 0523 12/29/16 0452  NA 128* 129*   < > 124* 132* 128* 127* 130* 129*  K 2.8* 3.6   < > 3.4* 2.5* 3.1* 3.9 3.0* 3.5  CL 98* 99*   < > 93* 103 91* 91* 87* 87*  CO2 19* 23   < > 24 21* 27 27 32 32  GLUCOSE 116* 86   < > 114* 103* 146* 105* 117* 116*  BUN 27* 11   < > 6 8 6 9 16 18   CREATININE 0.97 0.67   < > 0.51 0.34* 0.53 0.47 0.56 0.51  CALCIUM 8.7* 8.1*   < > 7.6* 6.2* 8.3*  8.1* 8.5* 8.4* 8.5*  MG 1.6* 2.2  --   --   1.0*  --  1.6* 1.8  --   PHOS  --  <1.0*   < > 2.2* 1.5*  --  2.0* 2.4* 2.6   < > = values in this interval not displayed.   GFR: Estimated Creatinine Clearance: 44.7 mL/min (by C-G formula based on SCr of 0.51 mg/dL). Liver Function Tests: Recent Labs  Lab 12/22/16 1955 12/26/16 0536 12/27/16 0747  AST 39  --  30  ALT 21  --  12*  ALKPHOS 73  --  36*  BILITOT 1.5*  --  0.3  PROT 8.9*  --  6.1*  ALBUMIN 4.8 2.4* 3.2*   No results for input(s): LIPASE, AMYLASE in the last 168 hours. No results for input(s): AMMONIA in the last 168 hours. Coagulation Profile: No results for input(s): INR, PROTIME in the last 168 hours. Cardiac Enzymes: No results for input(s): CKTOTAL, CKMB, CKMBINDEX, TROPONINI in the last 168 hours. BNP (last 3 results) No results for input(s): PROBNP in the last 8760 hours. HbA1C: No results for input(s): HGBA1C in the last 72 hours. CBG: No results for input(s): GLUCAP in the last 168 hours. Lipid Profile: No results for input(s): CHOL, HDL, LDLCALC, TRIG, CHOLHDL, LDLDIRECT in the last 72 hours. Thyroid Function Tests: No results for input(s): TSH, T4TOTAL, FREET4, T3FREE, THYROIDAB in the last 72 hours. Anemia Panel: No results for input(s): VITAMINB12, FOLATE, FERRITIN, TIBC, IRON, RETICCTPCT in the last 72 hours. Sepsis Labs: Recent Labs  Lab 12/22/16 2203 12/23/16 0020 12/23/16 0443 12/24/16 0657  LATICACIDVEN 2.19* 2.4* 2.6* 1.3    Recent Results (from the past 240 hour(s))  Blood Culture (routine x 2)     Status: None   Collection Time: 12/22/16  7:55 PM  Result Value Ref Range Status   Specimen Description BLOOD RIGHT FOREARM  Final   Special Requests IN PEDIATRIC BOTTLE Blood Culture adequate volume  Final   Culture   Final    NO GROWTH 5 DAYS Performed at [facility], [address] [place], [place] [address]    Report Status 12/27/2016 FINAL  Final  Blood Culture (routine x 2)     Status: None   Collection Time: 12/22/16   7:57 PM  Result Value Ref Range Status   Specimen Description BLOOD LEFT FOREARM  Final   Special Requests IN PEDIATRIC BOTTLE Blood Culture adequate volume  Final   Culture   Final    NO GROWTH 5 DAYS Performed at [facility], [address], [place], [place] [address]    Report Status 12/27/2016 FINAL  Final  Urine culture     Status: None   Collection Time: 12/22/16  9:39 PM  Result Value Ref Range Status   Specimen Description URINE, CATHETERIZED  Final   Special Requests NONE  Final   Culture   Final    NO GROWTH Performed at [facility], [address], [place], [place] [address]    Report Status 12/24/2016 FINAL  Final         Radiology Studies: Dg Chest Port 1 View  Result Date: 12/28/2016 CLINICAL DATA:  61 year old female with shortness of breath. Smoker, COPD. EXAM: PORTABLE CHEST 1 VIEW COMPARISON:  12/25/2016 and earlier. FINDINGS: Chronic large lung volumes with emphysema and upper lobe architectural distortion. No pneumothorax, pulmonary edema or acute pulmonary opacity. No definite pleural effusion. Mediastinal contours remain normal. Negative visible bowel gas pattern. Osteopenia. IMPRESSION: Stable chronic lung disease with Emphysema (ICD10-J43.9). No superimposed acute findings are identified. Electronically Signed   By: Odessa Fleming M.D.   On: 12/28/2016 13:32        Scheduled Meds: . cholecalciferol  1,000 Units Oral Daily  . enoxaparin (LOVENOX) injection  20 mg Subcutaneous Q24H  . feeding supplement (ENSURE ENLIVE)  237 mL Oral TID BM  . furosemide  20 mg Intravenous BID  . hydrocortisone sod succinate (SOLU-CORTEF) inj  50 mg Intravenous Q12H  . mometasone-formoterol  2 puff Inhalation BID  .  multivitamin with minerals  1 tablet Oral Daily  . phosphorus  250 mg Oral TID  . potassium chloride  40 mEq Oral Q4H  . tiotropium  18 mcg Inhalation Daily   Continuous Infusions:    LOS: 6 days    Time spent: 40 minutes    Ramiro Harvest, MD [facility] Pager [phone]  If 7PM-7AM, please contact night-coverage [email or web address] Password [facility] 12/29/2016, 10:49 AM

## 2016-12-29 NOTE — Progress Notes (Signed)
Pharmacy: Phosphorus replacement  Patient is a 61 y.o F with failure to thrive, presented to the ED on 11/13 d/t poor oral intake.  Patient was found to have electrolytes abnormalities including low phosphorus.  - 11/15 at 0432: phos level <1 (Kphos 20 mmol x1 at 0621 - 0.6 mmol/kg)             Na low, K 3.6, scr ok, Ca slightly low at 8.1             At 1942  phos level 1.1 (kphos 20 mmol x1 given at 2315) - 11/16 at 0442: phos level  Therapeutic 3.2 (drawn too early, need to draw at least 6 hrs after infusion-- infusion still running when drawn) Na low 125, K 4.1 -11/16 at 1223: phos level drawn ~6 hrs after completion of PM dose on 11/15 now back low at 2.2  -11/17 @0536 : Phos has dropped to 1.5, Na 132, K+2.5, Magnesium 1.0, CorrCa 7.48; added neutral phos 250 mg BID  -11/18 @0747 : Phos slightly low at 2.0, Na 127, K+ 3.9, Mag 1.6, CorrCa WNL; Na phos 12/18 x1, changed neutral phos to TID - 11/19 at 0523: phos up from 2 to 2.4 with K phos neutral dose increased to 250 mg TID + Na phos per MD on 11/18 , K low 3- MD repleting, Na low but up to 130, Mag 1.8, CorrCa wnl 11/20: phos up 2.6, Na low 129, K 3.5  Assessment/Plan: - continue phosphorus tablet to TID  - F/U phosphorus labs in AM  12/18, PharmD, BCPS 12/29/2016 10:02 AM

## 2016-12-29 NOTE — Progress Notes (Signed)
Nutrition Follow-up  DOCUMENTATION CODES:   Severe malnutrition in context of social or environmental circumstances  INTERVENTION:    Ensure Enlive po TID, each supplement provides 350 kcal and 20 grams of protein  Provide MVI daily  NUTRITION DIAGNOSIS:   Severe Malnutrition related to social / environmental circumstances as evidenced by severe fat depletion, severe muscle depletion.  Ongoing  GOAL:   Patient will meet greater than or equal to 90% of their needs  Progressing  MONITOR:   PO intake, Supplement acceptance, Labs, Weight trends  REASON FOR ASSESSMENT:   Malnutrition Screening Tool    ASSESSMENT:   Pt with PMH significant for polysubstance abuse, FTT, and COPD. Presents this admission after family noticed pt was not eating and drinking very well. Admitted for chronic and long standing FTT and dehydration.   Pt reports appetite has increased back to baseline. Eager to eat lunch this afternoon. When asked what pt ate for breakfast, pt could not remember. Meal completions charted as 50% average for last eight meals.  Pt enjoys Ensure and drinks them TID. Offered coupons for post discharge needs and discussed using them with EBT.  Weight noted to be up 5 lb since last RD visit.  Will continue with current nutrition interventions.   Medications reviewed and include: Vit D, lasix, MVi with minerals, phosphorus tablet, KCl Labs reviewed: Na 129 (L) Vit D 17.4 (L)  Diet Order:  Diet Heart Room service appropriate? Yes; Fluid consistency: Thin  EDUCATION NEEDS:   Not appropriate for education at this time  Skin:  Skin Assessment: Skin Integrity Issues: Skin Integrity Issues:: Stage I Stage I: coccyx  Last BM:  12/28/16  Height:   Ht Readings from Last 1 Encounters:  12/22/16 5\' 6"  (1.676 m)    Weight:   Wt Readings from Last 1 Encounters:  12/29/16 82 lb 6.4 oz (37.4 kg)    Ideal Body Weight:  59.1 kg  BMI:  Body mass index is 13.3  kg/m.  Estimated Nutritional Needs:   Kcal:  1700-1900 kcal/day  Protein:  85-95 g/day  Fluid:  >1.7 L/day    12/31/16 RD, LDN Clinical Nutrition Pager # - [phone]

## 2016-12-30 LAB — BASIC METABOLIC PANEL
Anion gap: 12 (ref 5–15)
BUN: 22 mg/dL — AB (ref 6–20)
CALCIUM: 8.5 mg/dL — AB (ref 8.9–10.3)
CHLORIDE: 81 mmol/L — AB (ref 101–111)
CO2: 36 mmol/L — ABNORMAL HIGH (ref 22–32)
CREATININE: 0.54 mg/dL (ref 0.44–1.00)
GFR calc non Af Amer: >60 mL/min (ref >60)
Glucose, Bld: 117 mg/dL — ABNORMAL HIGH (ref 65–99)
Potassium: 2.8 mmol/L — ABNORMAL LOW (ref 3.5–5.1)
SODIUM: 129 mmol/L — AB (ref 135–145)

## 2016-12-30 LAB — PHOSPHORUS: PHOSPHORUS: 3.9 mg/dL (ref 2.5–4.6)

## 2016-12-30 MED ORDER — K PHOS MONO-SOD PHOS DI & MONO 155-852-130 MG PO TABS
250.0000 mg | ORAL_TABLET | Freq: Two times a day (BID) | ORAL | Status: DC
  Administered 2016-12-30 – 2017-01-01 (×4): 250 mg via ORAL
  Filled 2016-12-30 (×4): qty 1

## 2016-12-30 MED ORDER — POTASSIUM CHLORIDE CRYS ER 20 MEQ PO TBCR
40.0000 meq | EXTENDED_RELEASE_TABLET | Freq: Four times a day (QID) | ORAL | Status: AC
  Administered 2016-12-30 (×2): 40 meq via ORAL
  Filled 2016-12-30 (×2): qty 2

## 2016-12-30 NOTE — Care Management Note (Signed)
Case Management Note  Patient Details  Name: Jennifer Hurley MRN: [id number] Date of Birth: 06-26-1955  Subjective/Objective:  61 y/o f admitted w/FTT. Adrenal Insuffieciency. PT recc SNF. CSW following.                  Action/Plan:d/c plan SNF.   Expected Discharge Date:  (unknown)               Expected Discharge Plan:  Skilled Nursing Facility  In-House Referral:     Discharge planning Services  CM Consult  Post Acute Care Choice:    Choice offered to:     DME Arranged:    DME Agency:     HH Arranged:    HH Agency:     Status of Service:  In process, will continue to follow  If discussed at Long Length of Stay Meetings, dates discussed:    Additional Comments:  Lanier Clam, RN 12/30/2016, 11:33 AM

## 2016-12-30 NOTE — Progress Notes (Signed)
PT Cancellation Note  Patient Details Name: Jennifer Hurley MRN: [id number] DOB: Jun 27, 1955   Cancelled Treatment:    Reason Eval/Treat Not Completed: Medical issues which prohibited therapy, per RN, hyopotensive. Will check back another day.   [name] 12/30/2016, 4:13 PM Blanchard Kelch PT [phone]

## 2016-12-30 NOTE — Progress Notes (Signed)
PROGRESS NOTE  Jennifer Hurley  IDC:[id number] DOB: [birth year] DOA: 12/22/2016 PCP: No primary care provider on file.   Brief Narrative:  Jennifer RUSSAK is a 61 y.o. female with medical history significant of etoh and polysubstance abuse, FTT, copd comes in after family found her at home not eating and drinking very well.  Pt denies any pain anywhere.  She denies any n/v/d although this was reported earlier to ED staff.  She denies any fevers.  Pt found to have hyponatremia and aki and dehydration and referred for admission for such.  She was initially with low bp and then with high bp in ED.  Her ROS is grossly negative. Was admitted and worked up noted to likely have an adrenal insufficiency.  Patient started on oral hydrocortisone however due to hypotension and hyponatremia patient was placed back on IV hydrocortisone 50 mg every 8 hours.  Assessment & Plan:   Principal Problem:   Failure to thrive (0-17) Active Problems:   Essential hypertension   COPD with emphysema (HCC)   Hypokalemia   Systolic CHF, chronic (HCC)   Severe protein-calorie malnutrition (HCC)   Tobacco abuse   Cocaine abuse (HCC)   Alcohol abuse   Acute kidney injury (HCC)   Dehydration   AKI (acute kidney injury) (HCC)   FTT (failure to thrive) in adult   Hypomagnesemia   Pressure injury of skin   Cough   Hypotension   Adrenal insufficiency (Addison's disease) (HCC)   Vitamin D deficiency  Failure to thrive: Questionable etiology.  Noted to be chronic and long-standing.  May be secondary adrenal insufficiency. No evidence of infection, TSH wnl.  - Treat adrenal insufficiency as below and monitor  Adrenal insufficiency: Patient noted to be hypotensive, hyponatremic, hypokalemic with hypomagnesemic.   - Cosyntropin stimulation test done. Baseline cortisol level was 1.  - After initial improvement with hyponatremia on IV dexamethasone and fludrocortisone, steroids changed to oral hydrocortisone 20 mg in the  morning and 10 mg at night with recurrent hypotension, prompting return to IV hydrocortisone. - Will reattempt transition to oral hydrocortisone 11/22.  - Patient will likely need outpatient follow-up with endocrinology, [name].  Office will call with an appointment..  Acute kidney injury: Likely secondary to a prerenal azotemia as patient was noted to be hypotensive on admission with systolic blood pressures in the 70s. Urinalysis negative nitrite, negative leukocytes, negative proteins.  Renal function improved with hydration. - Monitor  Cough: Resolved.  Severe dehydration: Resolved. - Saline lock IV fluids.  Supportive care.  Hypokalemia/hypomagnesemia: Due to nearly no per oral intake. - Replete and monitor.  Hyponatremia: Hypoosmolar. Would fit with adrenal insufficiency.  - Monitor  Severe protein calorie malnutrition:  - Nutrition following.   - Continue oral phosphorus supplementation.   COPD: No flare. - Continue Symbicort and Spiriva.  Nebs as needed.  Alcohol abuse: Alcohol level less than 5.  Patient with no overt signs or symptoms of withdrawal. - Ongoing cessation counseling advised  Polysubstance abuse - UDS negative.  Microcytic anemia: Likely dilutional.  Patient with no overt bleeding.  Patient noted to have a microcytic anemia.  Patient with failure to thrive.   - May benefit from referral for ouitpatient screening colonoscopy.  No prior history of colonoscopies per patient.    Hypocalcemia: PTH wnl. Suspect due to vitamin D deficiency and insufficient intake.   Vitamin D deficiency - Continue vitamin D 1000 units daily.  DVT prophylaxis: Lovenox Code Status: Full Family Communication: None at  bedside Disposition Plan: SNF when medically stable on oral hydrocortisone..  Consultants:   None  Procedures:   Chest x-ray 12/22/2016, 12/23/2016  Antimicrobials:   None  Subjective: She feels fatigued, no appetite, hasn't been eating much at all,  having urinary incontinence, but continent of stool. No dyspnea or dizziness.  Objective: Vitals:   12/30/16 0525 12/30/16 0752 12/30/16 1539 12/30/16 1756  BP: 97/81  (!) 80/57 108/60  Pulse: (!) 109  89   Resp: 18  18   Temp: 97.9 F (36.6 C)  97.9 F (36.6 C)   TempSrc: Oral  Oral   SpO2: 97% 98% 95%   Weight: 36.6 kg (80 lb 11 oz)     Height:       General exam: Frail, cachectic female appearing older than stated age in no distress Respiratory system: Nonlabored and clear Cardiovascular system: RRR, no murmur, JVD, or edema.  Gastrointestinal system: Abdomen is soft, nontender, nondistended, positive bowel sounds.  Central nervous system: Alert and oriented. No focal neurological deficits. Extremities: No deformities, decreased muscle bulk Skin: No rashes, lesions or ulcers Psychiatry: Judgement and insight appear fair. Mood & affect appropriate.    CBC: Recent Labs  Lab 12/25/16 1223 12/26/16 0536 12/27/16 0747 12/28/16 0523 12/29/16 0452  WBC 4.7 1.9* 4.2 5.7 5.7  NEUTROABS 1.6* 1.2* 2.6 3.4 3.9  HGB 9.3* 7.9* 9.9* 9.9* 9.8*  HCT 25.5* 22.0* 27.2* 27.7* 27.5*  MCV 78.0 77.2* 76.8* 78.7 79.3  PLT 118* 126* 180 226 235   Basic Metabolic Panel: Recent Labs  Lab 12/24/16 0432  12/26/16 0536 12/26/16 1558 12/27/16 0747 12/28/16 0523 12/29/16 0452 12/30/16 0451  NA 129*   < > 132* 128* 127* 130* 129* 129*  K 3.6   < > 2.5* 3.1* 3.9 3.0* 3.5 2.8*  CL 99*   < > 103 91* 91* 87* 87* 81*  CO2 23   < > 21* 27 27 32 32 36*  GLUCOSE 86   < > 103* 146* 105* 117* 116* 117*  BUN 11   < > 8 6 9 16 18  22*  CREATININE 0.67   < > 0.34* 0.53 0.47 0.56 0.51 0.54  CALCIUM 8.1*   < > 6.2* 8.3*  8.1* 8.5* 8.4* 8.5* 8.5*  MG 2.2  --  1.0*  --  1.6* 1.8  --   --   PHOS <1.0*   < > 1.5*  --  2.0* 2.4* 2.6 3.9   < > = values in this interval not displayed.   GFR: Estimated Creatinine Clearance: 43.7 mL/min (by C-G formula based on SCr of 0.54 mg/dL). Liver Function  Tests: Recent Labs  Lab 12/26/16 0536 12/27/16 0747  AST  --  30  ALT  --  12*  ALKPHOS  --  36*  BILITOT  --  0.3  PROT  --  6.1*  ALBUMIN 2.4* 3.2*   No results for input(s): LIPASE, AMYLASE in the last 168 hours. No results for input(s): AMMONIA in the last 168 hours. Coagulation Profile: No results for input(s): INR, PROTIME in the last 168 hours. Cardiac Enzymes: No results for input(s): CKTOTAL, CKMB, CKMBINDEX, TROPONINI in the last 168 hours. BNP (last 3 results) No results for input(s): PROBNP in the last 8760 hours. HbA1C: No results for input(s): HGBA1C in the last 72 hours. CBG: No results for input(s): GLUCAP in the last 168 hours. Lipid Profile: No results for input(s): CHOL, HDL, LDLCALC, TRIG, CHOLHDL, LDLDIRECT in the last 72 hours.  Thyroid Function Tests: No results for input(s): TSH, T4TOTAL, FREET4, T3FREE, THYROIDAB in the last 72 hours. Anemia Panel: No results for input(s): VITAMINB12, FOLATE, FERRITIN, TIBC, IRON, RETICCTPCT in the last 72 hours. Sepsis Labs: Recent Labs  Lab 12/24/16 0657  LATICACIDVEN 1.3    Recent Results (from the past 240 hour(s))  Blood Culture (routine x 2)     Status: None   Collection Time: 12/22/16  7:55 PM  Result Value Ref Range Status   Specimen Description BLOOD RIGHT FOREARM  Final   Special Requests IN PEDIATRIC BOTTLE Blood Culture adequate volume  Final   Culture   Final    NO GROWTH 5 DAYS Performed at [facility], [address], [place], [place] [address]    Report Status 12/27/2016 FINAL  Final  Blood Culture (routine x 2)     Status: None   Collection Time: 12/22/16  7:57 PM  Result Value Ref Range Status   Specimen Description BLOOD LEFT FOREARM  Final   Special Requests IN PEDIATRIC BOTTLE Blood Culture adequate volume  Final   Culture   Final    NO GROWTH 5 DAYS Performed at [facility], [address], [place], [place] [address]    Report Status 12/27/2016 FINAL  Final  Urine  culture     Status: None   Collection Time: 12/22/16  9:39 PM  Result Value Ref Range Status   Specimen Description URINE, CATHETERIZED  Final   Special Requests NONE  Final   Culture   Final    NO GROWTH Performed at [facility], [address], [place], [place] [address]    Report Status 12/24/2016 FINAL  Final         Radiology Studies: No results found.  Scheduled Meds: . cholecalciferol  1,000 Units Oral Daily  . enoxaparin (LOVENOX) injection  20 mg Subcutaneous Q24H  . feeding supplement (ENSURE ENLIVE)  237 mL Oral TID BM  . hydrocortisone sod succinate (SOLU-CORTEF) inj  50 mg Intravenous Q12H  . mometasone-formoterol  2 puff Inhalation BID  . multivitamin with minerals  1 tablet Oral Daily  . phosphorus  250 mg Oral BID  . tiotropium  18 mcg Inhalation Daily   Continuous Infusions:    LOS: 7 days    Time spent: 25 minutes    Hazeline Junker, MD [facility] Pager [phone]   If 7PM-7AM, please contact night-coverage [email or web address] Password [facility] 12/30/2016, 7:16 PM

## 2016-12-30 NOTE — Progress Notes (Signed)
Pharmacy: Phosphorus replacement  Patient is a 61 y.o F with failure to thrive, presented to the ED on 11/13 d/t poor oral intake.  Patient was found to have electrolytes abnormalities including low phosphorus and pharmacy consulted to replete.   11/15 phos level <1 - Kphos 20 mmol   Repeat phos level 1.1 - kphos 20 mmol  11/16 phos 3.2 but drawn too early  11/16 phos 2.2 (drawn appropriately)  11/17: phos dropped to 1.5; Mag 1.0 - added K phos neutral 250 mg BID   11/18: phos 2.0, Na phos 10 mmol, increase Kphos neutral to TID  11/19: phos 2.4; Mg now wnl  11/20: phos up 2.6  11/21: phos continues to rise to 3.9   Assessment/Plan:  Will decrease K-phos neutral back to BID   Daily serum phos level  Bernadene Person, PharmD, BCPS Pager: [phone] 12/30/2016, 11:19 AM

## 2016-12-31 LAB — BASIC METABOLIC PANEL
Anion gap: 10 (ref 5–15)
BUN: 21 mg/dL — AB (ref 6–20)
CALCIUM: 8.5 mg/dL — AB (ref 8.9–10.3)
CO2: 37 mmol/L — ABNORMAL HIGH (ref 22–32)
CREATININE: 0.49 mg/dL (ref 0.44–1.00)
Chloride: 84 mmol/L — ABNORMAL LOW (ref 101–111)
Glucose, Bld: 111 mg/dL — ABNORMAL HIGH (ref 65–99)
Potassium: 3.2 mmol/L — ABNORMAL LOW (ref 3.5–5.1)
SODIUM: 131 mmol/L — AB (ref 135–145)

## 2016-12-31 LAB — PHOSPHORUS: PHOSPHORUS: 3.9 mg/dL (ref 2.5–4.6)

## 2016-12-31 MED ORDER — HYDROCORTISONE 20 MG PO TABS
60.0000 mg | ORAL_TABLET | ORAL | Status: DC
  Filled 2016-12-31: qty 3

## 2016-12-31 MED ORDER — POTASSIUM CHLORIDE CRYS ER 20 MEQ PO TBCR
40.0000 meq | EXTENDED_RELEASE_TABLET | Freq: Four times a day (QID) | ORAL | Status: AC
  Administered 2016-12-31: 40 meq via ORAL
  Filled 2016-12-31: qty 2

## 2016-12-31 MED ORDER — HYDROCORTISONE 20 MG PO TABS
30.0000 mg | ORAL_TABLET | ORAL | Status: DC
  Administered 2016-12-31: 18:00:00 30 mg via ORAL
  Filled 2016-12-31: qty 1

## 2016-12-31 NOTE — Plan of Care (Signed)
  Progressing Health Behavior/Discharge Planning: Ability to manage health-related needs will improve 12/31/2016 1025 - Progressing by Gwendlyn Deutscher, RN Clinical Measurements: Ability to maintain clinical measurements within normal limits will improve 12/31/2016 1025 - Progressing by Gwendlyn Deutscher, RN Note BP WNL this AM. Per MD, plan to transition to oral steroids today. Will monitor for effect.  Diagnostic test results will improve 12/31/2016 1025 - Progressing by Gwendlyn Deutscher, RN Safety: Ability to remain free from injury will improve 12/31/2016 1025 - Progressing by Gwendlyn Deutscher, RN

## 2016-12-31 NOTE — Progress Notes (Signed)
PROGRESS NOTE  Jennifer Hurley  OAC:[id number] DOB: 11/14/1955 DOA: 12/22/2016 PCP: No primary care provider on file.   Brief Narrative:  Jennifer Hurley is a 61 y.o. female with medical history significant of etoh and polysubstance abuse, FTT, copd comes in after family found her at home not eating and drinking very well.  Pt denies any pain anywhere.  She denies any n/v/d although this was reported earlier to ED staff.  She denies any fevers.  Pt found to have hyponatremia and aki and dehydration and referred for admission for such.  She was initially with low bp and then with high bp in ED.  Her ROS is grossly negative. Was admitted and worked up noted to likely have an adrenal insufficiency.  Patient started on oral hydrocortisone however due to hypotension and hyponatremia patient was placed back on IV hydrocortisone 50 mg every 8 hours.  Assessment & Plan:   Principal Problem:   Failure to thrive (0-17) Active Problems:   Essential hypertension   COPD with emphysema (HCC)   Hypokalemia   Systolic CHF, chronic (HCC)   Severe protein-calorie malnutrition (HCC)   Tobacco abuse   Cocaine abuse (HCC)   Alcohol abuse   Acute kidney injury (HCC)   Dehydration   AKI (acute kidney injury) (HCC)   FTT (failure to thrive) in adult   Hypomagnesemia   Pressure injury of skin   Cough   Hypotension   Adrenal insufficiency (Addison's disease) (HCC)   Vitamin D deficiency  Failure to thrive: Questionable etiology.  Noted to be chronic and long-standing.  May be secondary adrenal insufficiency. No evidence of infection, TSH wnl.  - Treat adrenal insufficiency as below and monitor - SNF at DC  Adrenal insufficiency: Patient noted to be hypotensive, hyponatremic, hypokalemic with hypomagnesemia. Cosyntropin stimulation test done. Baseline cortisol level was 1.  - After initial improvement with hyponatremia on IV dexamethasone and fludrocortisone, steroids changed to oral hydrocortisone 20 mg  in the morning and 10 mg at night with recurrent hypotension, prompting return to IV hydrocortisone. - Will convert IV hydrocortisone 50mg  IV q12h to hydrocortisone 60mg  qAM, 30mg  qPM. Prolonged taper anticipated. - Patient will likely need outpatient follow-up with endocrinology, Dr. in the next week. Office will call with an appointment..  Acute kidney injury: Likely secondary to a prerenal azotemia due to hypotension at admission, Cr 0.97 from baseline of about 0.5. Resolved with hydration, steroids. - Monitor  Cough: Resolved.  Severe dehydration: Resolved. - Saline lock IV fluids.  Hypokalemia/hypomagnesemia: Due to nearly no per oral intake. - Replete and monitor. May need daily replacement and recheck within 1 week after discharge.  Hyponatremia: Hypoosmolar. Would fit with adrenal insufficiency.  - Monitor  Severe protein calorie malnutrition:  - Nutrition following.   - Continue oral phosphorus supplementation.   COPD: No flare. - Continue symbicort, spiriva and prn nebs  Alcohol abuse: Alcohol level less than 5.  Patient with no overt signs or symptoms of withdrawal. - Ongoing cessation counseling advised  Polysubstance abuse - UDS negative.  Microcytic anemia: Likely dilutional.  Patient with no overt bleeding.  Patient noted to have a microcytic anemia.  Patient with failure to thrive.   - May benefit from referral for ouitpatient screening colonoscopy.  No prior history of colonoscopies per patient.    Hypocalcemia: PTH wnl. Suspect due to vitamin D deficiency and insufficient intake.   Vitamin D deficiency - Continue vitamin D 1000 units daily.  DVT prophylaxis: Lovenox Code Status:  Full Family Communication: None at bedside Disposition Plan: SNF when medically stable on oral hydrocortisone. Possibly 1-2 days.  Consultants:   None  Procedures:   Chest x-ray 12/22/2016, 12/23/2016  Antimicrobials:   None  Subjective: She feels "fine" this  morning. Normal, in fact. No complaints, declines a long list of ROS, specifically chest pain, dyspnea, fevers, abd pain, N/V/D, weakness.   Objective: Vitals:   12/30/16 2126 12/31/16 0517 12/31/16 1112 12/31/16 1422  BP: 110/74 124/83  120/78  Pulse: 90 86  97  Resp: 17 18  18   Temp: 98.7 F (37.1 C) 98.7 F (37.1 C)  98.9 F (37.2 C)  TempSrc: Oral Oral  Oral  SpO2: 98% 100% 93% 100%  Weight:  37.5 kg (82 lb 10.8 oz)    Height:       General exam: Frail, cachectic female appearing older than stated age in no distress Respiratory system: Nonlabored and clear Cardiovascular system: RRR, no murmur, JVD, or edema.  Gastrointestinal system: Abdomen is soft, nontender, nondistended, positive bowel sounds.  Central nervous system: Alert and oriented. No focal neurological deficits. Extremities: No deformities, decreased muscle bulk Skin: No rashes, lesions or ulcers Psychiatry: Judgement and insight appear fair. Mood & affect appropriate.    CBC: Recent Labs  Lab 12/25/16 1223 12/26/16 0536 12/27/16 0747 12/28/16 0523 12/29/16 0452  WBC 4.7 1.9* 4.2 5.7 5.7  NEUTROABS 1.6* 1.2* 2.6 3.4 3.9  HGB 9.3* 7.9* 9.9* 9.9* 9.8*  HCT 25.5* 22.0* 27.2* 27.7* 27.5*  MCV 78.0 77.2* 76.8* 78.7 79.3  PLT 118* 126* 180 226 235   Basic Metabolic Panel: Recent Labs  Lab 12/26/16 0536  12/27/16 0747 12/28/16 0523 12/29/16 0452 12/30/16 0451 12/31/16 0521  NA 132*   < > 127* 130* 129* 129* 131*  K 2.5*   < > 3.9 3.0* 3.5 2.8* 3.2*  CL 103   < > 91* 87* 87* 81* 84*  CO2 21*   < > 27 32 32 36* 37*  GLUCOSE 103*   < > 105* 117* 116* 117* 111*  BUN 8   < > 9 16 18  22* 21*  CREATININE 0.34*   < > 0.47 0.56 0.51 0.54 0.49  CALCIUM 6.2*   < > 8.5* 8.4* 8.5* 8.5* 8.5*  MG 1.0*  --  1.6* 1.8  --   --   --   PHOS 1.5*  --  2.0* 2.4* 2.6 3.9 3.9   < > = values in this interval not displayed.   GFR: Estimated Creatinine Clearance: 44.8 mL/min (by C-G formula based on SCr of 0.49  mg/dL). Liver Function Tests: Recent Labs  Lab 12/26/16 0536 12/27/16 0747  AST  --  30  ALT  --  12*  ALKPHOS  --  36*  BILITOT  --  0.3  PROT  --  6.1*  ALBUMIN 2.4* 3.2*   Recent Results (from the past 240 hour(s))  Blood Culture (routine x 2)     Status: None   Collection Time: 12/22/16  7:55 PM  Result Value Ref Range Status   Specimen Description BLOOD RIGHT FOREARM  Final   Special Requests IN PEDIATRIC BOTTLE Blood Culture adequate volume  Final   Culture   Final    NO GROWTH 5 DAYS Performed at [facility], [address], [place], [place] [address]    Report Status 12/27/2016 FINAL  Final  Blood Culture (routine x 2)     Status: None   Collection Time:  12/22/16  7:57 PM  Result Value Ref Range Status   Specimen Description BLOOD LEFT FOREARM  Final   Special Requests IN PEDIATRIC BOTTLE Blood Culture adequate volume  Final   Culture   Final    NO GROWTH 5 DAYS Performed at [facility], [address] [place], [place] [address]    Report Status 12/27/2016 FINAL  Final  Urine culture     Status: None   Collection Time: 12/22/16  9:39 PM  Result Value Ref Range Status   Specimen Description URINE, CATHETERIZED  Final   Special Requests NONE  Final   Culture   Final    NO GROWTH Performed at [facility], [address], [place], [place] [address]    Report Status 12/24/2016 FINAL  Final    Radiology Studies: No results found.  Scheduled Meds: . cholecalciferol  1,000 Units Oral Daily  . enoxaparin (LOVENOX) injection  20 mg Subcutaneous Q24H  . feeding supplement (ENSURE ENLIVE)  237 mL Oral TID BM  . hydrocortisone  30 mg Oral Q24H   And  . hydrocortisone  60 mg Oral Q24H  . mometasone-formoterol  2 puff Inhalation BID  . multivitamin with minerals  1 tablet Oral Daily  . phosphorus  250 mg Oral BID  . potassium chloride  40 mEq Oral Q6H  . tiotropium  18 mcg Inhalation Daily   Continuous Infusions:   LOS: 8 days   Time  spent: 25 minutes  Hazeline Junker, MD [facility] Pager [phone]   If 7PM-7AM, please contact night-coverage [email or web address] Password [facility] 12/31/2016, 3:47 PM

## 2016-12-31 NOTE — Progress Notes (Signed)
Pharmacy: Phosphorus replacement  Patient is a 61 y.o F with failure to thrive, presented to the ED on 11/13 d/t poor oral intake.  Patient was found to have electrolytes abnormalities including low phosphorus and pharmacy consulted to replete.   11/15 phos level <1 - Kphos 20 mmol   Repeat phos level 1.1 - kphos 20 mmol  11/16 phos 3.2 but drawn too early  11/16 phos 2.2 (drawn appropriately)  11/17: phos dropped to 1.5; Mag 1.0 - added K phos neutral 250 mg BID   11/18: phos 2.0, Na phos 10 mmol, increase Kphos neutral to TID  11/19: phos 2.4; Mg now wnl  11/20: phos up 2.6  11/21: phos continues to rise to 3.9 --> reduce dose Kphos neutral to BID  11/22: phos stable at 3.9  Assessment/Plan:  Continue K-phos neutral  BID   Daily serum phos level  Dorna Leitz, PharmD, BCPS 12/31/2016 8:14 AM

## 2017-01-01 LAB — MAGNESIUM: Magnesium: 1.6 mg/dL — ABNORMAL LOW (ref 1.7–2.4)

## 2017-01-01 LAB — BASIC METABOLIC PANEL
Anion gap: 9 (ref 5–15)
BUN: 19 mg/dL (ref 6–20)
CHLORIDE: 85 mmol/L — AB (ref 101–111)
CO2: 35 mmol/L — ABNORMAL HIGH (ref 22–32)
CREATININE: 0.45 mg/dL (ref 0.44–1.00)
Calcium: 8.4 mg/dL — ABNORMAL LOW (ref 8.9–10.3)
GFR calc non Af Amer: >60 mL/min (ref >60)
Glucose, Bld: 96 mg/dL (ref 65–99)
POTASSIUM: 3.1 mmol/L — AB (ref 3.5–5.1)
SODIUM: 129 mmol/L — AB (ref 135–145)

## 2017-01-01 LAB — PHOSPHORUS: PHOSPHORUS: 3.6 mg/dL (ref 2.5–4.6)

## 2017-01-01 MED ORDER — MAGNESIUM OXIDE 400 (241.3 MG) MG PO TABS: 400.0000 mg | ORAL_TABLET | Freq: Two times a day (BID) | ORAL | Status: AC

## 2017-01-01 MED ORDER — MOMETASONE FURO-FORMOTEROL FUM 100-5 MCG/ACT IN AERO: 2.0000 | INHALATION_SPRAY | Freq: Two times a day (BID) | RESPIRATORY_TRACT | Status: AC

## 2017-01-01 MED ORDER — HYDROCORTISONE 20 MG PO TABS
60.0000 mg | ORAL_TABLET | ORAL | Status: DC
  Administered 2017-01-01: 60 mg via ORAL
  Filled 2017-01-01: qty 3

## 2017-01-01 MED ORDER — ENSURE ENLIVE PO LIQD: 237.0000 mL | Freq: Three times a day (TID) | ORAL | 12 refills | Status: AC

## 2017-01-01 MED ORDER — POTASSIUM CHLORIDE CRYS ER 20 MEQ PO TBCR
20.0000 meq | EXTENDED_RELEASE_TABLET | Freq: Two times a day (BID) | ORAL | Status: DC
  Administered 2017-01-01: 20 meq via ORAL
  Filled 2017-01-01: qty 1

## 2017-01-01 MED ORDER — HYDROCORTISONE 20 MG PO TABS: ORAL_TABLET | ORAL | 0 refills | Status: AC

## 2017-01-01 MED ORDER — VITAMIN D3 25 MCG (1000 UNIT) PO TABS: 1000.0000 [IU] | ORAL_TABLET | Freq: Every day | ORAL | Status: AC

## 2017-01-01 MED ORDER — POTASSIUM CHLORIDE CRYS ER 20 MEQ PO TBCR: 20.0000 meq | EXTENDED_RELEASE_TABLET | Freq: Two times a day (BID) | ORAL | Status: AC

## 2017-01-01 MED ORDER — MAGNESIUM OXIDE 400 (241.3 MG) MG PO TABS
400.0000 mg | ORAL_TABLET | Freq: Two times a day (BID) | ORAL | Status: DC
  Administered 2017-01-01: 400 mg via ORAL
  Filled 2017-01-01: qty 1

## 2017-01-01 MED ORDER — TIOTROPIUM BROMIDE MONOHYDRATE 18 MCG IN CAPS: 18.0000 ug | ORAL_CAPSULE | Freq: Every day | RESPIRATORY_TRACT | 12 refills | Status: AC

## 2017-01-01 MED ORDER — ADULT MULTIVITAMIN W/MINERALS CH: 1.0000 | ORAL_TABLET | Freq: Every day | ORAL | Status: AC

## 2017-01-01 MED ORDER — K PHOS MONO-SOD PHOS DI & MONO 155-852-130 MG PO TABS: 250.0000 mg | ORAL_TABLET | Freq: Two times a day (BID) | ORAL | Status: AC

## 2017-01-01 NOTE — Discharge Summary (Signed)
Physician Discharge Summary  Jennifer Hurley NOB:[id number] DOB: 23-Mar-1955 DOA: 12/22/2016  PCP: None per patient  Admit date: 12/22/2016 Discharge date: 01/01/2017  Admitted From: Home Disposition: [facility]   Recommendations for Outpatient Follow-up:  1. Please obtain BMP, Mg, and phos as well as CBC in one week. 2. Monitor blood pressure, taper steroids as tolerated (for adrenal insufficiency) and follow up with endocrinology in the next 2-4 weeks.  Home Health: N/A Equipment/Devices: Per SNF Discharge Condition: Stable CODE STATUS: Full Diet recommendation: Regular  Brief/Interim Summary: Talise L Gilmoreis a 61 y.o.femalewith medical history significant ofetoh and polysubstance abuse, FTT, copd comes in after family found her at home not eating and drinking very well. Pt denies any pain anywhere. She denies any n/v/d although this was reported earlier to ED staff. She denies any fevers. Pt found to have hyponatremia and aki and dehydration and referred for admission for such. She was initially with low bp and then with high bp in ED. Her ROS is grossly negative. Was admitted and worked up noted to likely have an adrenal insufficiency.  Patient started on oral hydrocortisone however due to hypotension and hyponatremia patient was placed back on IV hydrocortisone 50 mg every 8 hours. This was weaned ultimately to hydrocortisone 60mg  qAM and 30mg  qPM. She will require a long taper from this and follow up with endocrinology. Physical therapy evaluated the patient and recommended skilled nursing facility placement, which the patient agrees to.  Discharge Diagnoses:  Principal Problem:   Failure to thrive (0-17) Active Problems:   Essential hypertension   COPD with emphysema (HCC)   Hypokalemia   Systolic CHF, chronic (HCC)   Severe protein-calorie malnutrition (HCC)   Tobacco abuse   Cocaine abuse (HCC)   Alcohol abuse   Acute kidney injury (HCC)   Dehydration    AKI (acute kidney injury) (HCC)   FTT (failure to thrive) in adult   Hypomagnesemia   Pressure injury of skin   Cough   Hypotension   Adrenal insufficiency (Addison's disease) (HCC)   Vitamin D deficiency  Failure to thrive: Questionable etiology.  Noted to be chronic and long-standing.  May be secondary adrenal insufficiency. No evidence of infection, TSH wnl.  - Treat adrenal insufficiency as below and monitor - SNF rehabilitation - Continue substance abstinence and encouraging per oral intake.  Adrenal insufficiency: Patient noted to be hypotensive, hyponatremic, hypokalemic with hypomagnesemia. Cosyntropin stimulation test done. Baseline cortisol level was 1.  - After initial improvement with hyponatremia on IV dexamethasone and fludrocortisone, steroids changed to oral hydrocortisone 20 mg in the morning and 10 mg at night with recurrent hypotension, prompting return to IV hydrocortisone. - On 11/22, converted IV hydrocortisone 50mg  IV q12h to hydrocortisone 60mg  qAM, 30mg  qPM. BPs have remained stable and pt appears well. Prolonged taper anticipated. - Patient will likely need outpatient follow-up with endocrinology, [name] in the next week.  Acute kidney injury: Likely secondary to a prerenal azotemia due to hypotension at admission, Cr 0.97 from baseline of about 0.5. Resolved with hydration, steroids. - Monitor  Cough: Resolved.  Severe dehydration: Resolved. - Saline lock IV fluids.  Hypokalemia/hypomagnesemia/hypophosphatemia: Due to nearly no per oral intake. - Repleted while inpatient, will continue BID supplementation and will require recheck in next week.   Hyponatremia: Hypoosmolar. Would fit with adrenal insufficiency.  - Monitor  Severe protein calorie malnutrition:  - Nutrition following. Protein supplementation, ensure, recommended. - Continue oral phosphorus supplementation.   COPD: No flare. - Continue  dulera, spiriva  Alcohol abuse: Alcohol  level less than 5.  Patient with no overt signs or symptoms of withdrawal. - Ongoing cessation counseling advised  Polysubstance abuse - UDS negative.  Microcytic anemia: Likely dilutional.  Patient with no overt bleeding. - May benefit from referral for outpatient screening colonoscopy.  No prior history of colonoscopies per patient.    Hypocalcemia: PTH wnl. Suspect due to vitamin D deficiency and insufficient intake.   Vitamin D deficiency - Continue vitamin D 1000 units daily.  Discharge Instructions Discharge Instructions    Call MD for:  difficulty breathing, headache or visual disturbances   Complete by:  As directed    Call MD for:  extreme fatigue   Complete by:  As directed    Call MD for:  persistant dizziness or light-headedness   Complete by:  As directed    Call MD for:  persistant nausea and vomiting   Complete by:  As directed    Call MD for:  temperature >100.4   Complete by:  As directed      Allergies as of 01/01/2017   No Known Allergies     Medication List    STOP taking these medications   arformoterol 15 MCG/2ML Nebu Commonly known as:  BROVANA   benzonatate 100 MG capsule Commonly known as:  TESSALON   guaiFENesin 600 MG 12 hr tablet Commonly known as:  MUCINEX     TAKE these medications   acetaminophen 325 MG tablet Commonly known as:  TYLENOL Take 2 tablets (650 mg total) by mouth every 6 (six) hours as needed for mild pain (or Fever >/= 101).   cholecalciferol 1000 units tablet Commonly known as:  VITAMIN D Take 1 tablet (1,000 Units total) by mouth daily. Start taking on:  01/02/2017   feeding supplement (ENSURE ENLIVE) Liqd Take 237 mLs by mouth 3 (three) times daily between meals.   hydrocortisone 20 MG tablet Commonly known as:  CORTEF Take 3 tablets (60 mg total) by mouth every morning AND 1.5 tablets (30 mg total) every evening.   magnesium oxide 400 (241.3 Mg) MG tablet Commonly known as:  MAG-OX Take 1 tablet (400  mg total) by mouth 2 (two) times daily.   mometasone-formoterol 100-5 MCG/ACT Aero Commonly known as:  DULERA Inhale 2 puffs into the lungs 2 (two) times daily.   multivitamin with minerals Tabs tablet Take 1 tablet by mouth daily. Start taking on:  01/02/2017   phosphorus 155-852-130 MG tablet Commonly known as:  K PHOS NEUTRAL Take 1 tablet (250 mg total) by mouth 2 (two) times daily.   potassium chloride SA 20 MEQ tablet Commonly known as:  K-DUR,KLOR-CON Take 1 tablet (20 mEq total) by mouth 2 (two) times daily.   tiotropium 18 MCG inhalation capsule Commonly known as:  SPIRIVA Place 1 capsule (18 mcg total) into inhaler and inhale daily. Start taking on:  01/02/2017       Contact information for follow-up providers    Talmage Coin, MD Follow up.   Specialty:  Endocrinology Why:  OFFICE WILL CALL TO SCHEDULE APPOINTMENT TIME. Contact information: [address] [place] [address] [phone]            Contact information for after-discharge care    Destination    [facility] Follow up.   Service:  Skilled Nursing Contact information: [address] [place] [address] [phone]  No Known Allergies  Consultations:  None  Procedures/Studies: Dg Chest 1 View  Result Date: 12/25/2016 CLINICAL DATA:  Acute onset of rhonchi. EXAM: CHEST 1 VIEW COMPARISON:  Chest radiograph performed 12/23/2016 FINDINGS: Peribronchial thickening is noted. Flattening of the hemidiaphragms suggests emphysema. Chronic increased interstitial markings are noted. No superimposed focal airspace consolidation is seen. No pleural effusion or pneumothorax is seen. The cardiomediastinal silhouette is normal in size. No acute osseous abnormalities are identified. IMPRESSION: Finding of emphysema, with chronic lung changes. No focal airspace consolidation seen. Electronically Signed   By: Roanna Raider M.D.   On:  12/25/2016 05:12   Dg Chest Port 1 View  Result Date: 12/28/2016 CLINICAL DATA:  61 year old female with shortness of breath. Smoker, COPD. EXAM: PORTABLE CHEST 1 VIEW COMPARISON:  12/25/2016 and earlier. FINDINGS: Chronic large lung volumes with emphysema and upper lobe architectural distortion. No pneumothorax, pulmonary edema or acute pulmonary opacity. No definite pleural effusion. Mediastinal contours remain normal. Negative visible bowel gas pattern. Osteopenia. IMPRESSION: Stable chronic lung disease with Emphysema (ICD10-J43.9). No superimposed acute findings are identified. Electronically Signed   By: Odessa Fleming M.D.   On: 12/28/2016 13:32   Dg Chest Port 1 View  Result Date: 12/23/2016 CLINICAL DATA:  Cough EXAM: PORTABLE CHEST 1 VIEW COMPARISON:  12/22/2016 FINDINGS: The lungs are hyperinflated likely secondary to COPD. There is no focal parenchymal opacity. There is no pleural effusion or pneumothorax. The heart and mediastinal contours are unremarkable. The osseous structures are unremarkable. IMPRESSION: No active disease. Electronically Signed   By: Elige Ko   On: 12/23/2016 12:32   Dg Chest Port 1 View  Result Date: 12/22/2016 CLINICAL DATA:  Shortness of breath and weakness. EXAM: PORTABLE CHEST 1 VIEW COMPARISON:  08/01/2015 FINDINGS: Normal heart size. No pleural effusion or edema. The lungs are hyperinflated. There are coarsened interstitial markings throughout both lungs compatible with emphysema. No superimposed airspace consolidation identified. IMPRESSION: 1. No acute findings. 2. Emphysema. Electronically Signed   By: Signa Kell M.D.   On: 12/22/2016 20:17   Subjective: Feels "fine," well. No dizziness, lightheadedness, abd pain, N/V/D, eating ok. Not as weak as at admission. No chest pain or dyspnea or wheezing.  Discharge Exam: Vitals:   01/01/17 0809 01/01/17 0810  BP:    Pulse:    Resp:    Temp:    SpO2: 96% 96%   General: Frail, cachectic female  appearing older than stated age in no distress Cardiovascular: RRR, no murmur, JVD or edema Respiratory: CTA bilaterally, no wheezing, no rhonchi Abdominal: Soft, NT, ND, bowel sounds +  Labs: BNP (last 3 results) No results for input(s): BNP in the last 8760 hours. Basic Metabolic Panel: Recent Labs  Lab 12/26/16 0536  12/27/16 0747 12/28/16 0523 12/29/16 0452 12/30/16 0451 12/31/16 0521 01/01/17 0443  NA 132*   < > 127* 130* 129* 129* 131* 129*  K 2.5*   < > 3.9 3.0* 3.5 2.8* 3.2* 3.1*  CL 103   < > 91* 87* 87* 81* 84* 85*  CO2 21*   < > 27 32 32 36* 37* 35*  GLUCOSE 103*   < > 105* 117* 116* 117* 111* 96  BUN 8   < > 9 16 18  22* 21* 19  CREATININE 0.34*   < > 0.47 0.56 0.51 0.54 0.49 0.45  CALCIUM 6.2*   < > 8.5* 8.4* 8.5* 8.5* 8.5* 8.4*  MG 1.0*  --  1.6* 1.8  --   --   --  1.6*  PHOS 1.5*  --  2.0* 2.4* 2.6 3.9 3.9 3.6   < > = values in this interval not displayed.   Liver Function Tests: Recent Labs  Lab 12/26/16 0536 12/27/16 0747  AST  --  30  ALT  --  12*  ALKPHOS  --  36*  BILITOT  --  0.3  PROT  --  6.1*  ALBUMIN 2.4* 3.2*   No results for input(s): LIPASE, AMYLASE in the last 168 hours. No results for input(s): AMMONIA in the last 168 hours. CBC: Recent Labs  Lab 12/25/16 1223 12/26/16 0536 12/27/16 0747 12/28/16 0523 12/29/16 0452  WBC 4.7 1.9* 4.2 5.7 5.7  NEUTROABS 1.6* 1.2* 2.6 3.4 3.9  HGB 9.3* 7.9* 9.9* 9.9* 9.8*  HCT 25.5* 22.0* 27.2* 27.7* 27.5*  MCV 78.0 77.2* 76.8* 78.7 79.3  PLT 118* 126* 180 226 235   Cardiac Enzymes: No results for input(s): CKTOTAL, CKMB, CKMBINDEX, TROPONINI in the last 168 hours. BNP: Invalid input(s): POCBNP CBG: No results for input(s): GLUCAP in the last 168 hours. D-Dimer No results for input(s): DDIMER in the last 72 hours. Hgb A1c No results for input(s): HGBA1C in the last 72 hours. Lipid Profile No results for input(s): CHOL, HDL, LDLCALC, TRIG, CHOLHDL, LDLDIRECT in the last 72 hours. Thyroid  function studies No results for input(s): TSH, T4TOTAL, T3FREE, THYROIDAB in the last 72 hours.  Invalid input(s): FREET3 Anemia work up No results for input(s): VITAMINB12, FOLATE, FERRITIN, TIBC, IRON, RETICCTPCT in the last 72 hours. Urinalysis    Component Value Date/Time   COLORURINE STRAW (A) 12/22/2016 2139   APPEARANCEUR CLEAR 12/22/2016 2139   LABSPEC 1.005 12/22/2016 2139   PHURINE 6.0 12/22/2016 2139   GLUCOSEU 50 (A) 12/22/2016 2139   HGBUR MODERATE (A) 12/22/2016 2139   BILIRUBINUR NEGATIVE 12/22/2016 2139   KETONESUR NEGATIVE 12/22/2016 2139   PROTEINUR NEGATIVE 12/22/2016 2139   UROBILINOGEN 0.2 05/25/2012 1327   NITRITE NEGATIVE 12/22/2016 2139   LEUKOCYTESUR NEGATIVE 12/22/2016 2139    Microbiology Recent Results (from the past 240 hour(s))  Blood Culture (routine x 2)     Status: None   Collection Time: 12/22/16  7:55 PM  Result Value Ref Range Status   Specimen Description BLOOD RIGHT FOREARM  Final   Special Requests IN PEDIATRIC BOTTLE Blood Culture adequate volume  Final   Culture   Final    NO GROWTH 5 DAYS Performed at [facility], [address], [place], [place] [address]    Report Status 12/27/2016 FINAL  Final  Blood Culture (routine x 2)     Status: None   Collection Time: 12/22/16  7:57 PM  Result Value Ref Range Status   Specimen Description BLOOD LEFT FOREARM  Final   Special Requests IN PEDIATRIC BOTTLE Blood Culture adequate volume  Final   Culture   Final    NO GROWTH 5 DAYS Performed at [facility], [address] [place], [place] [address]    Report Status 12/27/2016 FINAL  Final  Urine culture     Status: None   Collection Time: 12/22/16  9:39 PM  Result Value Ref Range Status   Specimen Description URINE, CATHETERIZED  Final   Special Requests NONE  Final   Culture   Final    NO GROWTH Performed at [facility], [address], [place], [place] [address]    Report Status 12/24/2016 FINAL  Final     Time coordinating discharge: Approximately  40 minutes  Hazeline Junkeryan Landrum Carbonell, MD  [facility] 01/01/2017, 11:18 AM Pager [phone]

## 2017-01-01 NOTE — Care Management Note (Signed)
Case Management Note  Patient Details  Name: Jennifer Hurley MRN: [id number] Date of Birth: 1955/12/25  Subjective/Objective: d/c plan SNF-CSW managing.                   Action/Plan:d/c SNF.   Expected Discharge Date:  01/01/17               Expected Discharge Plan:  Skilled Nursing Facility  In-House Referral:     Discharge planning Services  CM Consult  Post Acute Care Choice:    Choice offered to:     DME Arranged:    DME Agency:     HH Arranged:    HH Agency:     Status of Service:  Completed, signed off  If discussed at [employer], dates discussed:    Additional Comments:  Lanier Clam, RN 01/01/2017, 12:06 PM

## 2017-01-01 NOTE — Progress Notes (Signed)
Report given to Jerilee Hoh, RN at [facility]. Pt will be transported via [facility].

## 2017-01-01 NOTE — Progress Notes (Signed)
Pharmacy: Phosphorus replacement  Patient is a 61 y.o F with failure to thrive, presented to the ED on 11/13 d/t poor oral intake.  Patient was found to have electrolytes abnormalities including low phosphorus and pharmacy consulted to replete.   11/15 phos level <1 - Kphos 20 mmol   Repeat phos level 1.1 - kphos 20 mmol  11/16 phos 3.2 but drawn too early  11/16 phos 2.2 (drawn appropriately)  11/17: phos dropped to 1.5; Mag 1.0 - added K phos neutral 250 mg BID   11/18: phos 2.0, Na phos 10 mmol, increase Kphos neutral to TID  11/19: phos 2.4; Mg now wnl  11/20: phos up 2.6  11/21: phos continues to rise to 3.9 --> reduce dose Kphos neutral to BID  11/22: phos stable at 3.9  11/23: phos 3.6  Assessment/Plan:  Continue K-phos neutral  BID   Daily serum phos level  Dorna Leitz, PharmD, BCPS 01/01/2017 9:56 AM

## 2017-01-01 NOTE — Clinical Social Work Placement (Addendum)
[facility] called for transport.  Nurse call report to:[phone]  CLINICAL SOCIAL WORK PLACEMENT  NOTE  Date:  01/01/2017  Patient Details  Name: Jennifer Hurley MRN: [id number] Date of Birth: [birth year]  Clinical Social Work is seeking post-discharge placement for this patient at the Skilled  Nursing Facility level of care (*CSW will initial, date and re-position this form in  chart as items are completed):  Yes   Patient/family provided with [facility] Clinical Social Work Department's list of facilities offering this level of care within the geographic area requested by the patient (or if unable, by the patient's family).  Yes   Patient/family informed of their freedom to choose among providers that offer the needed level of care, that participate in Medicare, Medicaid or managed care program needed by the patient, have an available bed and are willing to accept the patient.  Yes   Patient/family informed of 's ownership interest in [facility] and [facility], as well as of the fact that they are under no obligation to receive care at these facilities.  PASRR submitted to EDS on       PASRR number received on       Existing PASRR number confirmed on 01/01/17     FL2 transmitted to all facilities in geographic area requested by pt/family on       FL2 transmitted to all facilities within larger geographic area on       Patient informed that his/her managed care company has contracts with or will negotiate with certain facilities, including the following:  [facility]     Yes   Patient/family informed of bed offers received.  Patient chooses bed at [facility]     Physician recommends and patient chooses bed at      Patient to be transferred to [facility] on  .  Patient to be transferred to facility by [facility]     Patient family notified on 01/01/17 of transfer.  Name of family member notified:      Patient will notify family.   PHYSICIAN        Additional Comment:    _______________________________________________ Clearance Coots, LCSW 01/01/2017, 11:38 AM

## 2017-03-27 ENCOUNTER — Inpatient Hospital Stay (HOSPITAL_COMMUNITY)
Admission: EM | Admit: 2017-03-27 | Discharge: 2017-04-09 | DRG: 207 | Disposition: E | Payer: Medicare Other | Attending: Pulmonary Disease | Admitting: Pulmonary Disease

## 2017-03-27 ENCOUNTER — Other Ambulatory Visit: Payer: Self-pay

## 2017-03-27 ENCOUNTER — Emergency Department (HOSPITAL_COMMUNITY): Payer: Medicare Other

## 2017-03-27 ENCOUNTER — Encounter (HOSPITAL_COMMUNITY): Payer: Self-pay

## 2017-03-27 DIAGNOSIS — R609 Edema, unspecified: Secondary | ICD-10-CM | POA: Diagnosis not present

## 2017-03-27 DIAGNOSIS — G92 Toxic encephalopathy: Secondary | ICD-10-CM | POA: Diagnosis present

## 2017-03-27 DIAGNOSIS — B370 Candidal stomatitis: Secondary | ICD-10-CM | POA: Diagnosis present

## 2017-03-27 DIAGNOSIS — J9622 Acute and chronic respiratory failure with hypercapnia: Secondary | ICD-10-CM | POA: Diagnosis present

## 2017-03-27 DIAGNOSIS — I340 Nonrheumatic mitral (valve) insufficiency: Secondary | ICD-10-CM | POA: Diagnosis not present

## 2017-03-27 DIAGNOSIS — K219 Gastro-esophageal reflux disease without esophagitis: Secondary | ICD-10-CM | POA: Diagnosis present

## 2017-03-27 DIAGNOSIS — Z66 Do not resuscitate: Secondary | ICD-10-CM | POA: Diagnosis present

## 2017-03-27 DIAGNOSIS — Z4659 Encounter for fitting and adjustment of other gastrointestinal appliance and device: Secondary | ICD-10-CM

## 2017-03-27 DIAGNOSIS — M069 Rheumatoid arthritis, unspecified: Secondary | ICD-10-CM | POA: Diagnosis present

## 2017-03-27 DIAGNOSIS — F17210 Nicotine dependence, cigarettes, uncomplicated: Secondary | ICD-10-CM | POA: Diagnosis present

## 2017-03-27 DIAGNOSIS — J441 Chronic obstructive pulmonary disease with (acute) exacerbation: Secondary | ICD-10-CM | POA: Diagnosis present

## 2017-03-27 DIAGNOSIS — R000 Tachycardia, unspecified: Secondary | ICD-10-CM | POA: Diagnosis present

## 2017-03-27 DIAGNOSIS — E871 Hypo-osmolality and hyponatremia: Secondary | ICD-10-CM | POA: Diagnosis present

## 2017-03-27 DIAGNOSIS — E8729 Other acidosis: Secondary | ICD-10-CM

## 2017-03-27 DIAGNOSIS — D696 Thrombocytopenia, unspecified: Secondary | ICD-10-CM | POA: Diagnosis not present

## 2017-03-27 DIAGNOSIS — R0902 Hypoxemia: Secondary | ICD-10-CM

## 2017-03-27 DIAGNOSIS — E876 Hypokalemia: Secondary | ICD-10-CM | POA: Diagnosis not present

## 2017-03-27 DIAGNOSIS — Z681 Body mass index (BMI) 19 or less, adult: Secondary | ICD-10-CM | POA: Diagnosis not present

## 2017-03-27 DIAGNOSIS — J9621 Acute and chronic respiratory failure with hypoxia: Secondary | ICD-10-CM | POA: Diagnosis present

## 2017-03-27 DIAGNOSIS — J440 Chronic obstructive pulmonary disease with acute lower respiratory infection: Secondary | ICD-10-CM | POA: Diagnosis present

## 2017-03-27 DIAGNOSIS — E875 Hyperkalemia: Secondary | ICD-10-CM | POA: Diagnosis present

## 2017-03-27 DIAGNOSIS — I10 Essential (primary) hypertension: Secondary | ICD-10-CM | POA: Diagnosis not present

## 2017-03-27 DIAGNOSIS — E872 Acidosis: Secondary | ICD-10-CM | POA: Diagnosis present

## 2017-03-27 DIAGNOSIS — Z515 Encounter for palliative care: Secondary | ICD-10-CM | POA: Diagnosis present

## 2017-03-27 DIAGNOSIS — E271 Primary adrenocortical insufficiency: Secondary | ICD-10-CM | POA: Diagnosis present

## 2017-03-27 DIAGNOSIS — N179 Acute kidney failure, unspecified: Secondary | ICD-10-CM | POA: Diagnosis present

## 2017-03-27 DIAGNOSIS — Z79899 Other long term (current) drug therapy: Secondary | ICD-10-CM

## 2017-03-27 DIAGNOSIS — Z930 Tracheostomy status: Secondary | ICD-10-CM

## 2017-03-27 DIAGNOSIS — R64 Cachexia: Secondary | ICD-10-CM | POA: Diagnosis present

## 2017-03-27 DIAGNOSIS — I272 Pulmonary hypertension, unspecified: Secondary | ICD-10-CM | POA: Diagnosis present

## 2017-03-27 DIAGNOSIS — G929 Unspecified toxic encephalopathy: Secondary | ICD-10-CM

## 2017-03-27 DIAGNOSIS — I252 Old myocardial infarction: Secondary | ICD-10-CM

## 2017-03-27 DIAGNOSIS — I5022 Chronic systolic (congestive) heart failure: Secondary | ICD-10-CM | POA: Diagnosis present

## 2017-03-27 DIAGNOSIS — Z0189 Encounter for other specified special examinations: Secondary | ICD-10-CM

## 2017-03-27 DIAGNOSIS — J9602 Acute respiratory failure with hypercapnia: Secondary | ICD-10-CM | POA: Diagnosis not present

## 2017-03-27 DIAGNOSIS — N19 Unspecified kidney failure: Secondary | ICD-10-CM

## 2017-03-27 DIAGNOSIS — J101 Influenza due to other identified influenza virus with other respiratory manifestations: Secondary | ICD-10-CM | POA: Diagnosis not present

## 2017-03-27 DIAGNOSIS — J9601 Acute respiratory failure with hypoxia: Secondary | ICD-10-CM

## 2017-03-27 DIAGNOSIS — R140 Abdominal distension (gaseous): Secondary | ICD-10-CM

## 2017-03-27 DIAGNOSIS — Z23 Encounter for immunization: Secondary | ICD-10-CM | POA: Diagnosis present

## 2017-03-27 DIAGNOSIS — Z7951 Long term (current) use of inhaled steroids: Secondary | ICD-10-CM

## 2017-03-27 DIAGNOSIS — Z978 Presence of other specified devices: Secondary | ICD-10-CM

## 2017-03-27 DIAGNOSIS — E43 Unspecified severe protein-calorie malnutrition: Secondary | ICD-10-CM | POA: Diagnosis present

## 2017-03-27 DIAGNOSIS — E870 Hyperosmolality and hypernatremia: Secondary | ICD-10-CM | POA: Diagnosis not present

## 2017-03-27 DIAGNOSIS — J1000 Influenza due to other identified influenza virus with unspecified type of pneumonia: Secondary | ICD-10-CM | POA: Diagnosis present

## 2017-03-27 DIAGNOSIS — I110 Hypertensive heart disease with heart failure: Secondary | ICD-10-CM | POA: Diagnosis present

## 2017-03-27 DIAGNOSIS — Z9911 Dependence on respirator [ventilator] status: Secondary | ICD-10-CM

## 2017-03-27 DIAGNOSIS — R0603 Acute respiratory distress: Secondary | ICD-10-CM

## 2017-03-27 DIAGNOSIS — I959 Hypotension, unspecified: Secondary | ICD-10-CM | POA: Diagnosis present

## 2017-03-27 DIAGNOSIS — T380X5A Adverse effect of glucocorticoids and synthetic analogues, initial encounter: Secondary | ICD-10-CM | POA: Diagnosis present

## 2017-03-27 LAB — BLOOD GAS, ARTERIAL
Acid-base deficit: 7.3 mmol/L — ABNORMAL HIGH (ref 0.0–2.0)
Bicarbonate: 20.4 mmol/L (ref 20.0–28.0)
Drawn by: 11249
O2 CONTENT: 8 L/min
O2 SAT: 98.3 %
PCO2 ART: 52 mmHg — AB (ref 32.0–48.0)
PH ART: 7.218 — AB (ref 7.350–7.450)
PO2 ART: 201 mmHg — AB (ref 83.0–108.0)
Patient temperature: 98.6

## 2017-03-27 LAB — CBC WITH DIFFERENTIAL/PLATELET
BAND NEUTROPHILS: 30 %
BASOS ABS: 0 10*3/uL (ref 0.0–0.1)
BLASTS: 0 %
Basophils Relative: 0 %
EOS ABS: 0 10*3/uL (ref 0.0–0.7)
Eosinophils Relative: 0 %
HCT: 41.6 % (ref 36.0–46.0)
HEMOGLOBIN: 15.3 g/dL — AB (ref 12.0–15.0)
LYMPHS ABS: 0.5 10*3/uL — AB (ref 0.7–4.0)
Lymphocytes Relative: 5 %
MCH: 28.6 pg (ref 26.0–34.0)
MCHC: 36.8 g/dL — ABNORMAL HIGH (ref 30.0–36.0)
MCV: 77.8 fL — ABNORMAL LOW (ref 78.0–100.0)
METAMYELOCYTES PCT: 0 %
MONOS PCT: 2 %
Monocytes Absolute: 0.2 10*3/uL (ref 0.1–1.0)
Myelocytes: 0 %
Neutro Abs: 8.2 10*3/uL — ABNORMAL HIGH (ref 1.7–7.7)
Neutrophils Relative %: 54 %
Other: 9 %
Platelets: 247 10*3/uL (ref 150–400)
Promyelocytes Absolute: 0 %
RBC: 5.35 MIL/uL — ABNORMAL HIGH (ref 3.87–5.11)
RDW: 13.7 % (ref 11.5–15.5)
WBC Morphology: INCREASED
WBC: 9.8 10*3/uL (ref 4.0–10.5)
nRBC: 0 /100 WBC

## 2017-03-27 LAB — COMPREHENSIVE METABOLIC PANEL
ALBUMIN: 4 g/dL (ref 3.5–5.0)
ALT: 25 U/L (ref 14–54)
AST: 60 U/L — ABNORMAL HIGH (ref 15–41)
Alkaline Phosphatase: 52 U/L (ref 38–126)
Anion gap: 16 — ABNORMAL HIGH (ref 5–15)
BUN: 75 mg/dL — ABNORMAL HIGH (ref 6–20)
CO2: 23 mmol/L (ref 22–32)
Calcium: 9.2 mg/dL (ref 8.9–10.3)
Chloride: 89 mmol/L — ABNORMAL LOW (ref 101–111)
Creatinine, Ser: 6.1 mg/dL — ABNORMAL HIGH (ref 0.44–1.00)
GFR calc Af Amer: 8 mL/min — ABNORMAL LOW (ref >60)
GFR calc non Af Amer: 7 mL/min — ABNORMAL LOW (ref >60)
GLUCOSE: 172 mg/dL — AB (ref 65–99)
POTASSIUM: 5.7 mmol/L — AB (ref 3.5–5.1)
SODIUM: 128 mmol/L — AB (ref 135–145)
Total Bilirubin: 0.4 mg/dL (ref 0.3–1.2)
Total Protein: 8.3 g/dL — ABNORMAL HIGH (ref 6.5–8.1)

## 2017-03-27 LAB — ETHANOL: Alcohol, Ethyl (B): <10 mg/dL (ref <10)

## 2017-03-27 LAB — I-STAT TROPONIN, ED: Troponin i, poc: 0 ng/mL (ref 0.00–0.08)

## 2017-03-27 LAB — INFLUENZA PANEL BY PCR (TYPE A & B)
Influenza A By PCR: POSITIVE — AB
Influenza B By PCR: NEGATIVE

## 2017-03-27 LAB — PHOSPHORUS: Phosphorus: 7.3 mg/dL — ABNORMAL HIGH (ref 2.5–4.6)

## 2017-03-27 LAB — I-STAT CG4 LACTIC ACID, ED
LACTIC ACID, VENOUS: 2.55 mmol/L — AB (ref 0.5–1.9)
LACTIC ACID, VENOUS: 3.3 mmol/L — AB (ref 0.5–1.9)

## 2017-03-27 LAB — BRAIN NATRIURETIC PEPTIDE: B NATRIURETIC PEPTIDE 5: 35.7 pg/mL (ref 0.0–100.0)

## 2017-03-27 MED ORDER — SODIUM CHLORIDE 0.9 % IV SOLN
1.0000 g | Freq: Every day | INTRAVENOUS | Status: DC
  Administered 2017-03-27 – 2017-03-28 (×2): 1 g via INTRAVENOUS
  Filled 2017-03-27: qty 10
  Filled 2017-03-27: qty 1

## 2017-03-27 MED ORDER — ONDANSETRON HCL 4 MG/2ML IJ SOLN: 4.0000 mg | Freq: Four times a day (QID) | INTRAMUSCULAR | Status: DC | PRN

## 2017-03-27 MED ORDER — IPRATROPIUM-ALBUTEROL 0.5-2.5 (3) MG/3ML IN SOLN: 3.0000 mL | RESPIRATORY_TRACT | Status: DC | PRN

## 2017-03-27 MED ORDER — BUDESONIDE 0.5 MG/2ML IN SUSP
0.5000 mg | Freq: Two times a day (BID) | RESPIRATORY_TRACT | Status: DC
  Administered 2017-03-28 – 2017-04-04 (×15): 0.5 mg via RESPIRATORY_TRACT
  Filled 2017-03-27 (×15): qty 2

## 2017-03-27 MED ORDER — IPRATROPIUM-ALBUTEROL 0.5-2.5 (3) MG/3ML IN SOLN
3.0000 mL | Freq: Once | RESPIRATORY_TRACT | Status: AC
  Administered 2017-03-27: 3 mL via RESPIRATORY_TRACT
  Filled 2017-03-27: qty 3

## 2017-03-27 MED ORDER — SODIUM CHLORIDE 0.9 % IV BOLUS (SEPSIS)
500.0000 mL | Freq: Once | INTRAVENOUS | Status: AC
  Administered 2017-03-27: 500 mL via INTRAVENOUS

## 2017-03-27 MED ORDER — HYDROCORTISONE NA SUCCINATE PF 100 MG IJ SOLR
100.0000 mg | Freq: Once | INTRAMUSCULAR | Status: AC
  Administered 2017-03-27: 100 mg via INTRAVENOUS
  Filled 2017-03-27: qty 2

## 2017-03-27 MED ORDER — ALBUTEROL (5 MG/ML) CONTINUOUS INHALATION SOLN
10.0000 mg/h | INHALATION_SOLUTION | Freq: Once | RESPIRATORY_TRACT | Status: AC
  Administered 2017-03-27: 10 mg/h via RESPIRATORY_TRACT
  Filled 2017-03-27: qty 20

## 2017-03-27 MED ORDER — SODIUM CHLORIDE 0.9 % IV SOLN
Freq: Once | INTRAVENOUS | Status: AC
  Administered 2017-03-27: 20:00:00 via INTRAVENOUS

## 2017-03-27 MED ORDER — ONDANSETRON HCL 4 MG PO TABS: 4.0000 mg | ORAL_TABLET | Freq: Four times a day (QID) | ORAL | Status: DC | PRN

## 2017-03-27 MED ORDER — ACETAMINOPHEN 325 MG PO TABS: 650.0000 mg | ORAL_TABLET | Freq: Four times a day (QID) | ORAL | Status: DC | PRN

## 2017-03-27 MED ORDER — IPRATROPIUM-ALBUTEROL 0.5-2.5 (3) MG/3ML IN SOLN: 3.0000 mL | Freq: Four times a day (QID) | RESPIRATORY_TRACT | Status: DC

## 2017-03-27 MED ORDER — GUAIFENESIN ER 600 MG PO TB12
600.0000 mg | ORAL_TABLET | Freq: Two times a day (BID) | ORAL | Status: DC
  Filled 2017-03-27: qty 1

## 2017-03-27 MED ORDER — MAGNESIUM SULFATE 2 GM/50ML IV SOLN
2.0000 g | Freq: Once | INTRAVENOUS | Status: AC
  Administered 2017-03-27: 2 g via INTRAVENOUS
  Filled 2017-03-27: qty 50

## 2017-03-27 MED ORDER — FAMOTIDINE 20 MG PO TABS
20.0000 mg | ORAL_TABLET | Freq: Two times a day (BID) | ORAL | Status: DC
  Filled 2017-03-27: qty 1

## 2017-03-27 MED ORDER — IPRATROPIUM-ALBUTEROL 0.5-2.5 (3) MG/3ML IN SOLN: 3.0000 mL | Freq: Once | RESPIRATORY_TRACT | Status: DC

## 2017-03-27 MED ORDER — HEPARIN SODIUM (PORCINE) 5000 UNIT/ML IJ SOLN
5000.0000 [IU] | Freq: Three times a day (TID) | INTRAMUSCULAR | Status: DC
  Administered 2017-03-28 – 2017-03-30 (×8): 5000 [IU] via SUBCUTANEOUS
  Filled 2017-03-27 (×9): qty 1

## 2017-03-27 MED ORDER — ARFORMOTEROL TARTRATE 15 MCG/2ML IN NEBU
15.0000 ug | INHALATION_SOLUTION | Freq: Two times a day (BID) | RESPIRATORY_TRACT | Status: DC
  Administered 2017-03-28 – 2017-04-04 (×14): 15 ug via RESPIRATORY_TRACT
  Filled 2017-03-27 (×18): qty 2

## 2017-03-27 MED ORDER — ENSURE ENLIVE PO LIQD
237.0000 mL | Freq: Three times a day (TID) | ORAL | Status: DC
  Administered 2017-03-28: 237 mL via ORAL
  Filled 2017-03-27 (×3): qty 237

## 2017-03-27 MED ORDER — ACETAMINOPHEN 650 MG RE SUPP
650.0000 mg | Freq: Four times a day (QID) | RECTAL | Status: DC | PRN
  Administered 2017-03-30: 650 mg via RECTAL
  Filled 2017-03-27: qty 1

## 2017-03-27 MED ORDER — AZITHROMYCIN 500 MG IV SOLR
500.0000 mg | Freq: Every day | INTRAVENOUS | Status: DC
  Administered 2017-03-28 (×2): 500 mg via INTRAVENOUS
  Filled 2017-03-27 (×2): qty 500

## 2017-03-27 MED ORDER — LORAZEPAM 2 MG/ML IJ SOLN
0.5000 mg | Freq: Once | INTRAMUSCULAR | Status: AC
  Administered 2017-03-27: 0.5 mg via INTRAVENOUS
  Filled 2017-03-27: qty 1

## 2017-03-27 MED ORDER — SODIUM CHLORIDE 0.9 % IV BOLUS (SEPSIS)
1000.0000 mL | Freq: Once | INTRAVENOUS | Status: AC
  Administered 2017-03-27: 1000 mL via INTRAVENOUS

## 2017-03-27 MED ORDER — METHYLPREDNISOLONE SODIUM SUCC 125 MG IJ SOLR
60.0000 mg | Freq: Three times a day (TID) | INTRAMUSCULAR | Status: DC
  Administered 2017-03-28 – 2017-03-30 (×7): 60 mg via INTRAVENOUS
  Filled 2017-03-27 (×7): qty 2

## 2017-03-27 MED ORDER — IPRATROPIUM BROMIDE 0.02 % IN SOLN
0.5000 mg | Freq: Once | RESPIRATORY_TRACT | Status: AC
  Administered 2017-03-27: 0.5 mg via RESPIRATORY_TRACT
  Filled 2017-03-27: qty 2.5

## 2017-03-27 NOTE — ED Triage Notes (Signed)
Patient coming from [facility] with c/o shortness of breath. Pt have hx copd and CHF. Pt had 10 mg of albuterol and 0.5 mg Atrovent and 125 mg of solumedrol.

## 2017-03-27 NOTE — ED Notes (Signed)
Patient refused rectal temp

## 2017-03-27 NOTE — H&P (Signed)
History and Physical    Jennifer Hurley:[id number] DOB: Sep 02, 1955 DOA: 03/21/2017  Referring Hurley/NP/PA: Karleen Dolphin, PA-C PCP: Patient, No Pcp Per  Patient coming from: [facility]   Chief Complaint: Shortness of breath  I have personally briefly reviewed patient's old medical records in [facility]   HPI: Jennifer Hurley is a 62 y.o. female with medical history significant of COPD, chronic respiratory failure, CHF last EF 55% 04/2012, adrenal insufficiency, history of alcohol and cocaine abuse; who presents from the nursing facility with complaints of shortness of breath.  The history is limited due to patient's respiratory distress. She reports having a nonproductive cough.  Associated symptoms include throat pain, poor overall intake, and weight loss.  She notes that her mother recently died and she is been feeling depressed, but does not report doing or taking anything to hurt herself. Notes having .  In route with EMS patient was given a DuoNeb.  Denies having any significant thoughts of suicide/wanting to hurt herself, fever, chest pain, dysuria, abdominal pain, nausea, vomiting, or diarrhea.  ED Course: The emergency department patient was noted to be afebrile, pulse 120-125, respirations 23-28, blood pressures maintained, O2 saturations 95-100% on 2 L of nasal cannula oxygen.  Labs revealed WBC 9.8, hemoglobin 15.3, platelets 247, sodium 128, potassium 5.7, chloride 89, BUN 75, creatinine 6.1, glucose 172.  Patient was given 1.5 L of normal saline IV fluid, 2 hour-long breathing treatments, Ativan 0.5 mg, and 100 mg  Solu-Cortef.  ABG revealed pH of 7.218, PCO2 52, PO2 201 on facemask.  TRH called to admit.  Patient reportedly refused rectal temperature.  Review of Systems  Constitutional: Positive for malaise/fatigue. Negative for chills and fever.  HENT: Positive for congestion and sore throat. Negative for hearing loss.   Eyes: Negative for photophobia and  pain.  Respiratory: Positive for cough and shortness of breath. Negative for sputum production.   Cardiovascular: Negative for chest pain and leg swelling.  Gastrointestinal: Negative for abdominal pain, diarrhea, nausea and vomiting.  Genitourinary: Negative for dysuria and frequency.       Positive for decreased urine output  Musculoskeletal: Positive for joint pain. Negative for falls.  Skin: Positive for rash.  Neurological: Positive for weakness. Negative for focal weakness and loss of consciousness.  Psychiatric/Behavioral: Negative for substance abuse.    Past Medical History:  Diagnosis Date  . Alcohol abuse   . Bronchitis   . Chronic respiratory failure (HCC)   . Cocaine abuse (HCC)   . COPD (chronic obstructive pulmonary disease) (HCC)   . Hyponatremia   . Myocardial infarction (HCC)   . Pneumonia   . Rheumatoid arthritis(714.0)   . Tobacco abuse     Past Surgical History:  Procedure Laterality Date  . TONSILLECTOMY    . TUBAL LIGATION       reports that she has been smoking cigarettes.  She has a 39.00 pack-year smoking history. she has never used smokeless tobacco. She reports that she drinks alcohol. She reports that she uses drugs. Drugs: Cocaine and Marijuana.  No Known Allergies  Family History  Problem Relation Age of Onset  . Coronary artery disease Unknown   . Diabetes type II Unknown     Prior to Admission medications   Medication Sig Start Date End Date Taking? Authorizing Provider  acetaminophen (TYLENOL) 325 MG tablet Take 2 tablets (650 mg total) by mouth every 6 (six) hours as needed for mild pain (or Fever >/= 101). 05/15/15  Yes Jennifer Canary U, DO  albuterol (PROVENTIL HFA;VENTOLIN HFA) 108 (90 Base) MCG/ACT inhaler Inhale 2 puffs into the lungs every 6 (six) hours as needed for wheezing or shortness of breath.   Yes Jennifer Hurley  albuterol (PROVENTIL) (2.5 MG/3ML) 0.083% nebulizer solution Take 2.5 mg by nebulization every 6 (six)  hours as needed for wheezing or shortness of breath.   Yes Jennifer Hurley  benzonatate (TESSALON) 100 MG capsule Take 100 mg by mouth 3 (three) times daily as needed for cough.   Yes Jennifer Hurley  budesonide-formoterol (SYMBICORT) 160-4.5 MCG/ACT inhaler Inhale 2 puffs into the lungs 2 (two) times daily. (0900 & 2100)   Yes Jennifer Hurley  cholecalciferol (VITAMIN D) 1000 units tablet Take 1 tablet (1,000 Units total) by mouth daily. Patient taking differently: Take 1,000 Units by mouth daily. (0900) 01/02/17  Yes Jennifer Nine, Hurley  famotidine (PEPCID) 20 MG tablet Take 20 mg by mouth 2 (two) times daily. (0900 & 2100)   Yes Jennifer Hurley  feeding supplement, ENSURE ENLIVE, (ENSURE ENLIVE) LIQD Take 237 mLs by mouth 3 (three) times daily between meals. 01/01/17  Yes Jennifer Nine, Hurley  hydrocortisone (CORTEF) 20 MG tablet Take 3 tablets (60 mg total) by mouth every morning AND 1.5 tablets (30 mg total) every evening. Patient taking differently: Take 3 tablets (60 mg total) by mouth every morning AND 1.5 tablets (30 mg total) every evening. (0900 &2100) 01/01/17  Yes Jennifer Nine, Hurley  magnesium oxide (MAG-OX) 400 (241.3 Mg) MG tablet Take 1 tablet (400 mg total) by mouth 2 (two) times daily. Patient taking differently: Take 400 mg by mouth 2 (two) times daily. (0900 & 2100) 01/01/17  Yes Jennifer Nine, Hurley  Multiple Vitamin (MULTIVITAMIN WITH MINERALS) TABS tablet Take 1 tablet by mouth daily. Patient taking differently: Take 1 tablet by mouth daily. (0900) 01/02/17  Yes Jennifer Nine, Hurley  Nutritional Supplements (RESOURCE 2.0 PO) Take 120 mLs by mouth 3 (three) times daily. With med pass   Yes Jennifer Hurley  phosphorus (K PHOS NEUTRAL) 155-852-130 MG tablet Take 1 tablet (250 mg total) by mouth 2 (two) times daily. Patient taking differently: Take 250 mg by mouth 2 (two) times daily. (0900 & 2100) 01/01/17  Yes Jennifer Nine, Hurley  potassium chloride  SA (K-DUR,KLOR-CON) 20 MEQ tablet Take 1 tablet (20 mEq total) by mouth 2 (two) times daily. Patient taking differently: Take 20 mEq by mouth 2 (two) times daily. (0900 & 2100) 01/01/17  Yes Jennifer Nine, Hurley  sennosides-docusate sodium (SENOKOT-S) 8.6-50 MG tablet Take 1 tablet by mouth at bedtime. (2100)   Yes Jennifer Hurley  tiotropium (SPIRIVA) 18 MCG inhalation capsule Place 1 capsule (18 mcg total) into inhaler and inhale daily. 01/02/17  Yes Jennifer Nine, Hurley  mometasone-formoterol (DULERA) 100-5 MCG/ACT AERO Inhale 2 puffs into the lungs 2 (two) times daily. Patient not taking: Reported on 04/01/2017 01/01/17   Jennifer Nine, Hurley  budesonide (PULMICORT) 0.5 MG/2ML nebulizer solution Take 2 mLs (0.5 mg total) by nebulization 2 (two) times daily. Patient not taking: Reported on 05/09/2015 04/25/12 08/01/15  Simonne Martinet, NP    Physical Exam:  Constitutional: Cachectic elderly female who appears ill in moderate respiratory distress Vitals:   04/08/2017 1727 03/18/2017 1730 04/01/2017 1936 03/26/2017 1945  BP: (!) 142/84 (!) 142/91 (!) 133/96   Pulse: (!) 120 (!) 125 (!) 123   Resp: (!) 23 (!) 23 Marland Kitchen)  28   Temp:   97.7 F (36.5 C)   TempSrc:   Oral   SpO2: 100% 95% 97% 100%   Eyes: PERRL, lids and conjunctivae normal ENMT: Mucous membranes are dry.  Rash present Neck: normal, supple, no masses, no thyromegaly Respiratory: Tachypneic with bilateral wheezes.  Patient using accessory muscles to breathe and talking in short 2-3 word sentences. Cardiovascular: Regular rate and rhythm, no murmurs / rubs / gallops. No extremity edema. 2+ pedal pulses. No carotid bruits.  Abdomen: no tenderness, no masses palpated. No hepatosplenomegaly. Bowel sounds positive.  Musculoskeletal: no clubbing / cyanosis. No joint deformity upper and lower extremities. Good ROM, no contractures.  Muscle atrophy. Skin: no rashes, lesions, ulcers. No induration Neurologic: CN 2-12 grossly intact. Sensation  intact, DTR normal. Strength 5/5 in all 4.  Psychiatric: Normal judgment and insight. Alert and oriented x 3.  Depressed mood.     Labs on Admission: I have personally reviewed following labs and imaging studies  CBC: Recent Labs  Lab 04/20/2017 1759  WBC 9.8  NEUTROABS 8.2*  HGB 15.3*  HCT 41.6  MCV 77.8*  PLT 247   Basic Metabolic Panel: Recent Labs  Lab 04-20-2017 1759  NA 128*  K 5.7*  CL 89*  CO2 23  GLUCOSE 172*  BUN 75*  CREATININE 6.10*  CALCIUM 9.2   GFR: CrCl cannot be calculated (Unknown ideal weight.). Liver Function Tests: Recent Labs  Lab 2017/04/20 1759  AST 60*  ALT 25  ALKPHOS 52  BILITOT 0.4  PROT 8.3*  ALBUMIN 4.0   No results for input(s): LIPASE, AMYLASE in the last 168 hours. No results for input(s): AMMONIA in the last 168 hours. Coagulation Profile: No results for input(s): INR, PROTIME in the last 168 hours. Cardiac Enzymes: No results for input(s): CKTOTAL, CKMB, CKMBINDEX, TROPONINI in the last 168 hours. BNP (last 3 results) No results for input(s): PROBNP in the last 8760 hours. HbA1C: No results for input(s): HGBA1C in the last 72 hours. CBG: No results for input(s): GLUCAP in the last 168 hours. Lipid Profile: No results for input(s): CHOL, HDL, LDLCALC, TRIG, CHOLHDL, LDLDIRECT in the last 72 hours. Thyroid Function Tests: No results for input(s): TSH, T4TOTAL, FREET4, T3FREE, THYROIDAB in the last 72 hours. Anemia Panel: No results for input(s): VITAMINB12, FOLATE, FERRITIN, TIBC, IRON, RETICCTPCT in the last 72 hours. Urine analysis:    Component Value Date/Time   COLORURINE STRAW (A) 12/22/2016 2139   APPEARANCEUR CLEAR 12/22/2016 2139   LABSPEC 1.005 12/22/2016 2139   PHURINE 6.0 12/22/2016 2139   GLUCOSEU 50 (A) 12/22/2016 2139   HGBUR MODERATE (A) 12/22/2016 2139   BILIRUBINUR NEGATIVE 12/22/2016 2139   KETONESUR NEGATIVE 12/22/2016 2139   PROTEINUR NEGATIVE 12/22/2016 2139   UROBILINOGEN 0.2 05/25/2012 1327    NITRITE NEGATIVE 12/22/2016 2139   LEUKOCYTESUR NEGATIVE 12/22/2016 2139   Sepsis Labs: No results found for this or any previous visit (from the past 240 hour(s)).   Radiological Exams on Admission: Dg Chest 2 View  Result Date: April 20, 2017 CLINICAL DATA:  Shortness of breath. EXAM: CHEST  2 VIEW COMPARISON:  December 28, 2016 FINDINGS: The study is limited due to over penetration. Scarring in the apices is stable. No pneumothorax. Hyperinflation of lungs consistent with emphysema/COPD. The cardiomediastinal silhouette is stable. No acute infiltrates. IMPRESSION: COPD/emphysema.  Scarring in the apices.  No acute infiltrates. Electronically Signed   By: Gerome Sam III M.D   On: 04/20/2017 19:19    EKG: Independently reviewed.  Sinus tachycardia 122 bpm  Assessment/Plan Respiratory failure with hypercapnia, COPD exacerbation, influenza A: Acute on chronic. patient presents with acute onset of shortness of breath wheezing.  Patient noted respiratory acidosis with ABG revealing a PCO2 of 52.  Patient and significant respiratory distress therefore placed on BiPAP.  Influenza screen is positive. - Admit to a stepdown bed  - Follow-up blood and sputum cultures - Check influenza and respiratory virus panel - Continuous pulse oximetry with oxygen as needed - BiPAP as needed - Give magnesium sulfate 2 g x1 dose - DuoNeb's QID and prn - Brovana and budesonide nebs twice daily - Solu-Medrol IV 60 mg every 8 hours - Liquid Mucinex - Will repeat ABG as needed  Acute encephalopathy: Patient initially reported to be acting altered.  Coming from nursing facility with previous history of polysubstance abuse.  Patient should have limited to no access and is currently in nursing facility. - neuro checks - Check UDS     Lactic acidosis: Acute.  Initial lactic acid noted to be 2.55 with repeat 3.3.  Patient given 2 continuous nebulizer treatments.  Suspect that this could be worsening symptoms versus  possibility of underlying infection. - Bolus additional IV fluids as tolerated - Trend lactic acid level  Acute renal failure: Patient previously noted to have a creatinine within normal limits, but presents with a creatinine of 6.1 and BUN 75.  Suspect prerenal cause of symptoms. - Check urinalysis - IV Fluids as tolerated - Check a urine drug screen  Oral candidiasis: Acute.  Causing painful swallowing. - Diflucan IV 200 mg x1 loading dose, will need to start nystatin swish and swallow continue IV in a.m. - Follow-up HIV screen,   Adrenal insufficiency - Will need to continue hydrocortisone  Hyperkalemia: Acute.  Initial potassium 5.7, but patient taking potassium supplementation. - Hold potassium supplementation - Recheck BMP in a.m.  Malnutrition - Check prealbumin in a.m. - Likely benefit from nutrition consult and supplementation when able  Hyponatremia: chronic.  Patient presents with sodium 128 on admission which appears to run in the mid to upper 120s.  - Continue to monitor  GERD - Oral Pepcid when able    DVT prophylaxis: Heparin Code Status: full  Family Communication:  none Disposition Plan: TBD  Consults called: none  Admission status: inpatient   Clydie Braun Hurley [facility] Pager [phone]   If 7PM-7AM, please contact night-coverage [email or web address] Password [facility]  04/03/2017, 9:06 PM

## 2017-03-27 NOTE — ED Notes (Addendum)
Patient shows some increased confusion, holding tissue box up to ear like a phone.

## 2017-03-27 NOTE — ED Notes (Signed)
I Stat Lactic: 2.55 RN and Provider notified

## 2017-03-27 NOTE — Progress Notes (Signed)
PHARMACY NOTE -  ANTIBIOTIC RENAL DOSE ADJUSTMENT   Request received for Pharmacy to assist with antibiotic renal dose adjustment.  Patient has been initiated on Ceftriaxone 1gm iv q24hr  for copd exacerbation. SCr 6.1, estimated CrCl 11 ml/min Current dosage is appropriate and need for further dosage adjustment appears unlikely at present. Will sign off at this time.  Please reconsult if a change in clinical status warrants re-evaluation of dosage.

## 2017-03-27 NOTE — ED Notes (Signed)
Bed: LK56 Expected date:  Expected time:  Means of arrival:  Comments: [facility]

## 2017-03-27 NOTE — ED Provider Notes (Signed)
[facility]-EMERGENCY DEPT Provider Note   CSN: [id number] Arrival date & time: 04/03/2017  1707     History   Chief Complaint No chief complaint on file.   HPI Jennifer Hurley is a 62 y.o. female.  HPI Jennifer Hurley is a 62 y.o. female with history of COPD, CHF, adrenal insufficiency, chronic respiratory failure, pneumonia, history of cocaine and alcohol abuse, hypertension, presents from [facility] nursing home with complaint of shortness of breath.  Patient states that she was feeling well this morning, states shortness of breath started this afternoon.  States she also has not been eating or drinking much.   She states she has not had appetite. She reports thrush to her mouth and tongue.  She denies any chest pain.  No abdominal pain.  No nausea or vomiting.  No flulike symptoms.  States she feels like she may be dehydrated.  Patient is a poor historian, currently very anxious, screaming, asking for water.  Past Medical History:  Diagnosis Date  . Alcohol abuse   . Bronchitis   . Chronic respiratory failure (HCC)   . Cocaine abuse (HCC)   . COPD (chronic obstructive pulmonary disease) (HCC)   . Hyponatremia   . Myocardial infarction (HCC)   . Pneumonia   . Rheumatoid arthritis(714.0)   . Tobacco abuse     Patient Active Problem List   Diagnosis Date Noted  . Vitamin D deficiency 12/28/2016  . Adrenal insufficiency (Addison's disease) (HCC)   . Hypotension 12/25/2016  . Pressure injury of skin 12/24/2016  . Cough   . Hypomagnesemia 12/23/2016  . FTT (failure to thrive) in adult 12/22/2016  . Weight loss   . UTI (lower urinary tract infection) 05/10/2015  . Failure to thrive (0-17) 05/10/2015  . Abdominal pain 05/10/2015  . Failure to thrive in adult 05/10/2015  . Acute kidney injury (HCC) 05/10/2015  . UTI (urinary tract infection) 05/10/2015  . Dehydration   . AKI (acute kidney injury) (HCC)   . Anemia of other chronic disease 06/29/2012    . HCAP (healthcare-associated pneumonia) 05/02/2012  . Tracheostomy status (HCC) 04/26/2012  . PSVT (paroxysmal supraventricular tachycardia) (HCC) 04/18/2012  . Agitation 04/18/2012  . Delirium 04/18/2012  . Acute respiratory failure (HCC) 04/13/2012  . COPD exacerbation (HCC) 04/13/2012  . Severe protein-calorie malnutrition (HCC) 04/12/2012  . Tobacco abuse   . Cocaine abuse (HCC)   . Alcohol abuse   . Persistent pneumonia 02/15/2012  . Chronic respiratory failure with hypoxia (HCC) 02/15/2012  . Trichomoniasis 02/13/2012  . Chloride, decreased level 02/12/2012  . Malnutrition (HCC) 02/12/2012  . Acute respiratory failure with hypoxia (HCC) 10/29/2011  . Leukocytosis 10/28/2011  . Hyponatremia 10/28/2011  . Hypokalemia 10/28/2011  . Systolic CHF, chronic (HCC) 10/28/2011  . CAP (community acquired pneumonia) 10/28/2011  . SUBSTANCE ABUSE, MULTIPLE 01/19/2008  . Essential hypertension 06/29/2007  . CANDIDIASIS, MOUTH (THRUSH) 07/26/2006  . 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 TONSILLECTOMY    . TUBAL LIGATION      OB History    No data available       Home Medications    Prior to Admission medications   Medication Sig Start Date End Date Taking? Authorizing Provider  acetaminophen (TYLENOL) 325 MG tablet Take 2 tablets (650 mg total) by mouth every 6 (six) hours as needed for mild pain (or Fever >/= 101). 05/15/15   Joseph Art, DO  cholecalciferol (  VITAMIN D) 1000 units tablet Take 1 tablet (1,000 Units total) by mouth daily. 01/02/17   Tyrone Nine, MD  feeding supplement, ENSURE ENLIVE, (ENSURE ENLIVE) LIQD Take 237 mLs by mouth 3 (three) times daily between meals. 01/01/17   Tyrone Nine, MD  hydrocortisone (CORTEF) 20 MG tablet Take 3 tablets (60 mg total) by mouth every morning AND 1.5 tablets (30 mg total) every evening. 01/01/17   Tyrone Nine, MD  magnesium oxide (MAG-OX) 400 (241.3 Mg) MG tablet  Take 1 tablet (400 mg total) by mouth 2 (two) times daily. 01/01/17   Tyrone Nine, MD  mometasone-formoterol (DULERA) 100-5 MCG/ACT AERO Inhale 2 puffs into the lungs 2 (two) times daily. 01/01/17   Tyrone Nine, MD  Multiple Vitamin (MULTIVITAMIN WITH MINERALS) TABS tablet Take 1 tablet by mouth daily. 01/02/17   Tyrone Nine, MD  phosphorus (K PHOS NEUTRAL) [phone] MG tablet Take 1 tablet (250 mg total) by mouth 2 (two) times daily. 01/01/17   Tyrone Nine, MD  potassium chloride SA (K-DUR,KLOR-CON) 20 MEQ tablet Take 1 tablet (20 mEq total) by mouth 2 (two) times daily. 01/01/17   Tyrone Nine, MD  tiotropium (SPIRIVA) 18 MCG inhalation capsule Place 1 capsule (18 mcg total) into inhaler and inhale daily. 01/02/17   Tyrone Nine, MD  albuterol (PROVENTIL) (5 MG/ML) 0.5% nebulizer solution Take 0.5 mLs (2.5 mg total) by nebulization every 6 (six) hours. Patient not taking: Reported on 05/09/2015 04/25/12 08/01/15  Simonne Martinet, NP  budesonide (PULMICORT) 0.5 MG/2ML nebulizer solution Take 2 mLs (0.5 mg total) by nebulization 2 (two) times daily. Patient not taking: Reported on 05/09/2015 04/25/12 08/01/15  Simonne Martinet, NP    Family History Family History  Problem Relation Age of Onset  . Coronary artery disease Unknown   . Diabetes type II Unknown     Social History Social History   Tobacco Use  . Smoking status: Current Every Day Smoker    Packs/day: 1.00    Years: 39.00    Pack years: 39.00    Types: Cigarettes  . Smokeless tobacco: Never Used  Substance Use Topics  . Alcohol use: Yes    Comment: quart of beer per day  . Drug use: Yes    Types: Cocaine, Marijuana    Comment: quit crack approx. 6 months ago, quit marijuana     Allergies   Patient has no known allergies.   Review of Systems Review of Systems  Constitutional: Positive for appetite change. Negative for chills and fever.  Respiratory: Positive for cough and shortness of breath. Negative for  chest tightness.   Cardiovascular: Negative for chest pain, palpitations and leg swelling.  Gastrointestinal: Negative for abdominal pain, diarrhea, nausea and vomiting.  Genitourinary: Negative for dysuria, flank pain and pelvic pain.  Musculoskeletal: Negative for arthralgias, myalgias, neck pain and neck stiffness.  Skin: Negative for rash.  Neurological: Negative for dizziness, weakness and headaches.  All other systems reviewed and are negative.    Physical Exam Updated Vital Signs BP (!) 142/91   Pulse (!) 125   Resp (!) 23   SpO2 95%   Physical Exam  Constitutional: She is oriented to person, place, and time. She appears well-developed and well-nourished. No distress.  HENT:  Head: Normocephalic.  Extensive thrush to lips, tongue, oropharynx  Eyes: Conjunctivae are normal.  Neck: Normal range of motion. Neck supple.  Cardiovascular: Regular rhythm and normal heart sounds.  Tachycardic  Pulmonary/Chest:  Tachypneic, accessory muscle use, speaking 3-4 word sentences.  Inspiratory and expiratory wheezes in all lung fields.  Abdominal: Soft. Bowel sounds are normal. She exhibits no distension. There is no tenderness. There is no rebound.  Musculoskeletal: She exhibits no edema.  Neurological: She is alert and oriented to person, place, and time.  Skin: Skin is warm and dry.  Psychiatric: She has a normal mood and affect. Her behavior is normal.  Nursing note and vitals reviewed.    ED Treatments / Results  Labs (all labs ordered are listed, but only abnormal results are displayed) Labs Reviewed  CBC WITH DIFFERENTIAL/PLATELET - Abnormal; Notable for the following components:      Result Value   RBC 5.35 (*)    Hemoglobin 15.3 (*)    MCV 77.8 (*)    MCHC 36.8 (*)    Neutro Abs 8.2 (*)    Lymphs Abs 0.5 (*)    All other components within normal limits  COMPREHENSIVE METABOLIC PANEL - Abnormal; Notable for the following components:   Sodium 128 (*)    Potassium  5.7 (*)    Chloride 89 (*)    Glucose, Bld 172 (*)    BUN 75 (*)    Creatinine, Ser 6.10 (*)    Total Protein 8.3 (*)    AST 60 (*)    GFR calc non Af Amer 7 (*)    GFR calc Af Amer 8 (*)    Anion gap 16 (*)    All other components within normal limits  INFLUENZA PANEL BY PCR (TYPE A & B) - Abnormal; Notable for the following components:   Influenza A By PCR POSITIVE (*)    All other components within normal limits  BLOOD GAS, ARTERIAL - Abnormal; Notable for the following components:   pH, Arterial 7.218 (*)    pCO2 arterial 52.0 (*)    pO2, Arterial 201 (*)    Acid-base deficit 7.3 (*)    All other components within normal limits  PHOSPHORUS - Abnormal; Notable for the following components:   Phosphorus 7.3 (*)    All other components within normal limits  I-STAT CG4 LACTIC ACID, ED - Abnormal; Notable for the following components:   Lactic Acid, Venous 2.55 (*)    All other components within normal limits  I-STAT CG4 LACTIC ACID, ED - Abnormal; Notable for the following components:   Lactic Acid, Venous 3.30 (*)    All other components within normal limits  CULTURE, BLOOD (ROUTINE X 2)  CULTURE, BLOOD (ROUTINE X 2)  CULTURE, EXPECTORATED SPUTUM-ASSESSMENT  GRAM STAIN  RESPIRATORY PANEL BY PCR  BRAIN NATRIURETIC PEPTIDE  ETHANOL  URINALYSIS, ROUTINE W REFLEX MICROSCOPIC  RAPID URINE DRUG SCREEN, HOSP PERFORMED  STREP PNEUMONIAE URINARY ANTIGEN  CBC  BASIC METABOLIC PANEL  MAGNESIUM  PHOSPHORUS  LEGIONELLA PNEUMOPHILA SEROGP 1 UR AG  HIV ANTIBODY (ROUTINE TESTING)  I-STAT TROPONIN, ED    EKG  EKG Interpretation  Date/Time:  Saturday April 22, 2017 19:15:03 EST Ventricular Rate:  122 PR Interval:    QRS Duration: 71 QT Interval:  304 QTC Calculation: 433 R Axis:   86 Text Interpretation:  Sinus tachycardia Consider right atrial enlargement Artifact in lead(s) I II III aVL aVF V2 V4 V5 V6 Confirmed by Raeford Razor [phone]) on 04-22-17 7:21:39 PM        Radiology Dg Chest 2 View  Result Date: 04/22/2017 CLINICAL DATA:  Shortness of breath. EXAM: CHEST  2 VIEW COMPARISON:  December 28, 2016  FINDINGS: The study is limited due to over penetration. Scarring in the apices is stable. No pneumothorax. Hyperinflation of lungs consistent with emphysema/COPD. The cardiomediastinal silhouette is stable. No acute infiltrates. IMPRESSION: COPD/emphysema.  Scarring in the apices.  No acute infiltrates. Electronically Signed   By: Gerome Sam III M.D   On: 03/29/17 19:19    Procedures Procedures (including critical care time) CRITICAL CARE Performed by: Carolynn Tuley Total critical care time: 30 minutes Critical care time was exclusive of separately billable procedures and treating other patients. Critical care was necessary to treat or prevent imminent or life-threatening deterioration. Critical care was time spent personally by me on the following activities: development of treatment plan with patient and/or surrogate as well as nursing, discussions with consultants, evaluation of patient's response to treatment, examination of patient, obtaining history from patient or surrogate, ordering and performing treatments and interventions, ordering and review of laboratory studies, ordering and review of radiographic studies, pulse oximetry and re-evaluation of patient's condition.  Medications Ordered in ED Medications  sodium chloride 0.9 % bolus 500 mL (500 mLs Intravenous New Bag/Given Mar 29, 2017 1806)  ipratropium-albuterol (DUONEB) 0.5-2.5 (3) MG/3ML nebulizer solution 3 mL (3 mLs Nebulization Given 29-Mar-2017 1758)  LORazepam (ATIVAN) injection 0.5 mg (0.5 mg Intravenous Given March 29, 2017 1754)  hydrocortisone sodium succinate (SOLU-CORTEF) 100 MG injection 100 mg (100 mg Intravenous Given 2017/03/29 1756)     Initial Impression / Assessment and Plan / ED Course  I have reviewed the triage vital signs and the nursing notes.  Pertinent labs &  imaging results that were available during my care of the patient were reviewed by me and considered in my medical decision making (see chart for details).     Patient with respiratory distress, acute on chronic.  Diffuse wheezing in all lung fields.  Does not appear to be fluid overloaded on exam.  Patient received Solu-Medrol and a breathing treatment on the way here.  She states she is not feeling better.  She is very anxious, she is screaming and crying, she is also asking for something to drink stating that she is very dehydrated.  Patient appears to be agitated.  Will order chest x-ray, basic labs, check troponin, BNP, I will give her some Ativan.  Patient is found to be tachycardic, otherwise normal vital signs at this time.  7:41 PM Patient received Ativan is calm her at this time, however her breathing has not improved after 2 breathing treatments here.  I will order her hour-long treatment.  She also received a dose of hydrocortisone for her adrenal insufficiency.  She is currently not complaining of anything, however she is still appears to be tachypneic and has some accessory muscle use with breathing.  She continues to be tachycardic.  Creatinine today resulted at 6.1.  Her baseline is 0.5.  BUN is 75.  Potassium slightly up at 5.7.  I suspect the cause of her renal failure may be dehydration.  She does look very dry.  However I will add on blood cultures, fluids and panel.  I attempted to do rectal temperature on her myself, to evaluate for possible fever in setting of possible sepsis, however she refused.  Oral temperature is 97.7.  Will continue fluids, patient will be admitted to the hospital.  9:17 PM Spoke with hospitalist, will admit. Asked to bladder scan, pt has 105cc of urine in bladder.   Vitals:   2017-03-29 2228 2017-03-29 2230 03/29/17 2245 03/29/2017 2300  BP:  (!) 152/93  (!) 141/102  Pulse: (!) 125 (!) 125 (!) 122 (!) 124  Resp: (!) 25 (!) 38 (!) 26 (!) 28  Temp:       TempSrc:      SpO2: 100% 100% 100% 100%     Final Clinical Impressions(s) / ED Diagnoses   Final diagnoses:  COPD exacerbation (HCC)  Acute renal failure, unspecified acute renal failure type [facility])    ED Discharge Orders    None       Jaynie Crumble, PA-C 03/28/17 0003    Jaynie Crumble, PA-C 03/28/17 0009    Gerhard Munch, MD 03/29/17 [phone]

## 2017-03-28 DIAGNOSIS — E875 Hyperkalemia: Secondary | ICD-10-CM

## 2017-03-28 DIAGNOSIS — G929 Unspecified toxic encephalopathy: Secondary | ICD-10-CM

## 2017-03-28 DIAGNOSIS — G92 Toxic encephalopathy: Secondary | ICD-10-CM

## 2017-03-28 DIAGNOSIS — N179 Acute kidney failure, unspecified: Secondary | ICD-10-CM | POA: Insufficient documentation

## 2017-03-28 DIAGNOSIS — I10 Essential (primary) hypertension: Secondary | ICD-10-CM

## 2017-03-28 DIAGNOSIS — E8729 Other acidosis: Secondary | ICD-10-CM

## 2017-03-28 DIAGNOSIS — J101 Influenza due to other identified influenza virus with other respiratory manifestations: Secondary | ICD-10-CM | POA: Diagnosis present

## 2017-03-28 DIAGNOSIS — E872 Acidosis: Secondary | ICD-10-CM

## 2017-03-28 LAB — RESPIRATORY PANEL BY PCR
ADENOVIRUS-RVPPCR: NOT DETECTED
Bordetella pertussis: NOT DETECTED
CORONAVIRUS NL63-RVPPCR: NOT DETECTED
CORONAVIRUS OC43-RVPPCR: NOT DETECTED
Chlamydophila pneumoniae: NOT DETECTED
Coronavirus 229E: NOT DETECTED
Coronavirus HKU1: NOT DETECTED
INFLUENZA A H1 2009-RVPPR: DETECTED — AB
INFLUENZA B-RVPPCR: NOT DETECTED
METAPNEUMOVIRUS-RVPPCR: NOT DETECTED
MYCOPLASMA PNEUMONIAE-RVPPCR: NOT DETECTED
PARAINFLUENZA VIRUS 1-RVPPCR: NOT DETECTED
PARAINFLUENZA VIRUS 2-RVPPCR: NOT DETECTED
PARAINFLUENZA VIRUS 4-RVPPCR: NOT DETECTED
Parainfluenza Virus 3: NOT DETECTED
RESPIRATORY SYNCYTIAL VIRUS-RVPPCR: NOT DETECTED
Rhinovirus / Enterovirus: NOT DETECTED

## 2017-03-28 LAB — BASIC METABOLIC PANEL
Anion gap: 11 (ref 5–15)
Anion gap: 15 (ref 5–15)
BUN: 62 mg/dL — AB (ref 6–20)
BUN: 64 mg/dL — AB (ref 6–20)
CALCIUM: 8 mg/dL — AB (ref 8.9–10.3)
CALCIUM: 9 mg/dL (ref 8.9–10.3)
CHLORIDE: 102 mmol/L (ref 101–111)
CHLORIDE: 101 mmol/L (ref 101–111)
CO2: 21 mmol/L — AB (ref 22–32)
CO2: 22 mmol/L (ref 22–32)
CREATININE: 4.63 mg/dL — AB (ref 0.44–1.00)
CREATININE: 4.39 mg/dL — AB (ref 0.44–1.00)
GFR calc non Af Amer: 9 mL/min — ABNORMAL LOW (ref >60)
GFR, EST AFRICAN AMERICAN: 11 mL/min — AB (ref >60)
GFR, EST AFRICAN AMERICAN: 12 mL/min — AB (ref >60)
GFR, EST NON AFRICAN AMERICAN: 10 mL/min — AB (ref >60)
Glucose, Bld: 143 mg/dL — ABNORMAL HIGH (ref 65–99)
Glucose, Bld: 103 mg/dL — ABNORMAL HIGH (ref 65–99)
Potassium: 5.6 mmol/L — ABNORMAL HIGH (ref 3.5–5.1)
Potassium: 5.5 mmol/L — ABNORMAL HIGH (ref 3.5–5.1)
SODIUM: 134 mmol/L — AB (ref 135–145)
SODIUM: 138 mmol/L (ref 135–145)

## 2017-03-28 LAB — HIV ANTIBODY (ROUTINE TESTING W REFLEX): HIV SCREEN 4TH GENERATION: NONREACTIVE

## 2017-03-28 LAB — RAPID URINE DRUG SCREEN, HOSP PERFORMED
AMPHETAMINES: NOT DETECTED
BENZODIAZEPINES: NOT DETECTED
Barbiturates: NOT DETECTED
Cocaine: NOT DETECTED
Opiates: NOT DETECTED
TETRAHYDROCANNABINOL: NOT DETECTED

## 2017-03-28 LAB — CBC
HEMATOCRIT: 37.6 % (ref 36.0–46.0)
HEMOGLOBIN: 13.3 g/dL (ref 12.0–15.0)
MCH: 28.2 pg (ref 26.0–34.0)
MCHC: 35.4 g/dL (ref 30.0–36.0)
MCV: 79.7 fL (ref 78.0–100.0)
Platelets: 176 10*3/uL (ref 150–400)
RBC: 4.72 MIL/uL (ref 3.87–5.11)
RDW: 14 % (ref 11.5–15.5)
WBC: 9.9 10*3/uL (ref 4.0–10.5)

## 2017-03-28 LAB — URINALYSIS, ROUTINE W REFLEX MICROSCOPIC
Bilirubin Urine: NEGATIVE
GLUCOSE, UA: 50 mg/dL — AB
Ketones, ur: NEGATIVE mg/dL
Leukocytes, UA: NEGATIVE
Nitrite: NEGATIVE
Protein, ur: NEGATIVE mg/dL
SPECIFIC GRAVITY, URINE: 1.005 (ref 1.005–1.030)
pH: 6 (ref 5.0–8.0)

## 2017-03-28 LAB — PREALBUMIN: PREALBUMIN: 19.7 mg/dL (ref 18–38)

## 2017-03-28 LAB — STREP PNEUMONIAE URINARY ANTIGEN: Strep Pneumo Urinary Antigen: NEGATIVE

## 2017-03-28 LAB — PHOSPHORUS: Phosphorus: 6.7 mg/dL — ABNORMAL HIGH (ref 2.5–4.6)

## 2017-03-28 LAB — MRSA PCR SCREENING: MRSA by PCR: NEGATIVE

## 2017-03-28 LAB — MAGNESIUM: Magnesium: 3.4 mg/dL — ABNORMAL HIGH (ref 1.7–2.4)

## 2017-03-28 MED ORDER — SODIUM CHLORIDE 0.9 % IV BOLUS (SEPSIS): 500.0000 mL | Freq: Once | INTRAVENOUS | Status: DC

## 2017-03-28 MED ORDER — FLUCONAZOLE 100MG IVPB
100.0000 mg | INTRAVENOUS | Status: AC
  Administered 2017-03-28 – 2017-03-29 (×2): 100 mg via INTRAVENOUS
  Filled 2017-03-28 (×3): qty 50

## 2017-03-28 MED ORDER — FAMOTIDINE IN NACL 20-0.9 MG/50ML-% IV SOLN
20.0000 mg | Freq: Two times a day (BID) | INTRAVENOUS | Status: DC
  Administered 2017-03-28: 20 mg via INTRAVENOUS
  Filled 2017-03-28: qty 50

## 2017-03-28 MED ORDER — FAMOTIDINE IN NACL 20-0.9 MG/50ML-% IV SOLN
20.0000 mg | INTRAVENOUS | Status: DC
  Administered 2017-03-28 – 2017-04-03 (×7): 20 mg via INTRAVENOUS
  Filled 2017-03-28 (×9): qty 50

## 2017-03-28 MED ORDER — SODIUM CHLORIDE 0.9 % IV SOLN
INTRAVENOUS | Status: AC
  Administered 2017-03-28: 20:00:00 via INTRAVENOUS

## 2017-03-28 MED ORDER — ALBUTEROL SULFATE (2.5 MG/3ML) 0.083% IN NEBU
2.5000 mg | INHALATION_SOLUTION | RESPIRATORY_TRACT | Status: DC
  Administered 2017-03-28 – 2017-03-29 (×5): 2.5 mg via RESPIRATORY_TRACT
  Filled 2017-03-28 (×5): qty 3

## 2017-03-28 MED ORDER — OSELTAMIVIR PHOSPHATE 30 MG PO CAPS: 30.0000 mg | ORAL_CAPSULE | Freq: Two times a day (BID) | ORAL | Status: DC

## 2017-03-28 MED ORDER — METOPROLOL TARTRATE 5 MG/5ML IV SOLN
2.5000 mg | INTRAVENOUS | Status: DC | PRN
  Administered 2017-03-28: 2.5 mg via INTRAVENOUS
  Filled 2017-03-28: qty 5

## 2017-03-28 MED ORDER — INFLUENZA VAC SPLIT QUAD 0.5 ML IM SUSY
0.5000 mL | PREFILLED_SYRINGE | INTRAMUSCULAR | Status: AC
  Administered 2017-03-29: 0.5 mL via INTRAMUSCULAR
  Filled 2017-03-28: qty 0.5

## 2017-03-28 MED ORDER — TIOTROPIUM BROMIDE MONOHYDRATE 18 MCG IN CAPS: 18.0000 ug | ORAL_CAPSULE | Freq: Every day | RESPIRATORY_TRACT | Status: DC

## 2017-03-28 MED ORDER — GUAIFENESIN 100 MG/5ML PO SOLN: 5.0000 mL | ORAL | Status: DC | PRN

## 2017-03-28 MED ORDER — ALBUTEROL SULFATE (2.5 MG/3ML) 0.083% IN NEBU: 2.5000 mg | INHALATION_SOLUTION | RESPIRATORY_TRACT | Status: DC | PRN

## 2017-03-28 MED ORDER — TIOTROPIUM BROMIDE MONOHYDRATE 18 MCG IN CAPS
18.0000 ug | ORAL_CAPSULE | Freq: Every day | RESPIRATORY_TRACT | Status: DC
  Filled 2017-03-28: qty 5

## 2017-03-28 MED ORDER — PNEUMOCOCCAL VAC POLYVALENT 25 MCG/0.5ML IJ INJ
0.5000 mL | INJECTION | INTRAMUSCULAR | Status: AC
  Administered 2017-03-29: 0.5 mL via INTRAMUSCULAR
  Filled 2017-03-28: qty 0.5

## 2017-03-28 MED ORDER — OSELTAMIVIR PHOSPHATE 6 MG/ML PO SUSR: 30.0000 mg | Freq: Every day | ORAL | Status: DC

## 2017-03-28 MED ORDER — METOPROLOL TARTRATE 5 MG/5ML IV SOLN
5.0000 mg | Freq: Three times a day (TID) | INTRAVENOUS | Status: DC
  Administered 2017-03-28 – 2017-03-29 (×2): 5 mg via INTRAVENOUS
  Filled 2017-03-28 (×3): qty 5

## 2017-03-28 MED ORDER — OSELTAMIVIR PHOSPHATE 6 MG/ML PO SUSR
30.0000 mg | Freq: Every day | ORAL | Status: AC
  Administered 2017-03-28 – 2017-04-01 (×5): 30 mg via ORAL
  Filled 2017-03-28 (×5): qty 12.5

## 2017-03-28 MED ORDER — FLUCONAZOLE IN SODIUM CHLORIDE 200-0.9 MG/100ML-% IV SOLN
200.0000 mg | Freq: Once | INTRAVENOUS | Status: AC
  Administered 2017-03-28: 200 mg via INTRAVENOUS
  Filled 2017-03-28: qty 100

## 2017-03-28 MED ORDER — ORAL CARE MOUTH RINSE
15.0000 mL | Freq: Two times a day (BID) | OROMUCOSAL | Status: DC
  Administered 2017-03-28: 15 mL via OROMUCOSAL

## 2017-03-28 NOTE — Plan of Care (Signed)
  Activity Intolerance Adequate nutrition will be maintained 03/28/2017 1718 - Progressing by Charlett Lango, RN

## 2017-03-28 NOTE — ED Notes (Addendum)
hospitalist made aware of pts elevated BP and anxiety. No intervention ordered at this time.

## 2017-03-28 NOTE — Progress Notes (Signed)
BiPAP PRN not in room. 

## 2017-03-28 NOTE — ED Notes (Signed)
ED TO INPATIENT HANDOFF REPORT  Name/Age/Gender Jennifer Hurley 62 y.o. female  Code Status    Code Status Orders  (From admission, onward)        Start     Ordered   04/05/2017 2238  Full code  Continuous     04/08/2017 2241    Code Status History    Date Active Date Inactive Code Status Order ID Comments User Context   12/22/2016 23:27 01/01/2017 15:47 Full Code [id number]  Phillips Grout, MD ED   05/10/2015 02:12 05/15/2015 13:41 Full Code [id number]  Norval Morton, MD Inpatient   04/25/2012 18:44 06/02/2012 16:21 Full Code [id number]  Ladoris Gene Inpatient   04/12/2012 16:11 04/25/2012 18:44 Full Code [id number]  Janece Canterbury, MD Inpatient   02/12/2012 21:24 02/17/2012 17:48 Full Code [id number]  Rueben Bash, RN Inpatient   11/03/2011 04:20 11/13/2011 15:58 Full Code [id number]  Quintella Baton, MD ED   10/28/2011 18:25 11/02/2011 17:20 Full Code [id number]  Charlestine Massed, RN Inpatient      Home/SNF/Other Home  Chief Complaint Shortenss of breath  Level of Care/Admitting Diagnosis ED Disposition    ED Disposition Condition [place]: [facility] [[id number]]  Level of Care: Stepdown [14]  Admit to SDU based on following criteria: Respiratory Distress:  Frequent assessment and/or intervention to maintain adequate ventilation/respiration, pulmonary toilet, and respiratory treatment.  Diagnosis: Acute respiratory failure with hypercapnia [facility]) [[id number]]  Admitting Physician: Norval Morton [[id number]]  Attending Physician: Norval Morton [[id number]]  Estimated length of stay: 3 - 4 days  Certification:: I certify this patient will need inpatient services for at least 2 midnights  PT Class (Do Not Modify): Inpatient [101]  PT Acc Code (Do Not Modify): Private [1]       Medical History Past Medical History:  Diagnosis Date  . Alcohol abuse   . Bronchitis   . Chronic respiratory failure ([place])   . Cocaine abuse ([place])   . COPD (chronic  obstructive pulmonary disease) ([place])   . Hyponatremia   . Myocardial infarction ([place])   . Pneumonia   . Rheumatoid arthritis(714.0)   . Tobacco abuse     Allergies No Known Allergies  IV Location/Drains/Wounds Patient Lines/Drains/Airways Status   Active Line/Drains/Airways    Name:   Placement date:   Placement time:   Site:   Days:   Peripheral IV 03/18/2017 Left Forearm   03/28/2017    -    Forearm   1   Peripheral IV 03/23/2017 Right;Posterior Forearm   03/15/2017    2202    Forearm   1   Pressure Injury 12/23/16 Stage I -  Intact skin with non-blanchable redness of a localized area usually over a bony prominence.   12/23/16    0015     95          Labs/Imaging Results for orders placed or performed during the hospital encounter of 03/23/2017 (from the past 48 hour(s))  CBC with Differential     Status: Abnormal   Collection Time: 03/26/2017  5:59 PM  Result Value Ref Range   WBC 9.8 4.0 - 10.5 K/uL   RBC 5.35 (H) 3.87 - 5.11 MIL/uL   Hemoglobin 15.3 (H) 12.0 - 15.0 g/dL   HCT 41.6 36.0 - 46.0 %   MCV 77.8 (L) 78.0 - 100.0 fL   MCH 28.6 26.0 - 34.0 pg   MCHC 36.8 (H) 30.0 - 36.0 g/dL  RDW 13.7 11.5 - 15.5 %   Platelets 247 150 - 400 K/uL   Neutrophils Relative % 54 %   Lymphocytes Relative 5 %   Monocytes Relative 2 %   Eosinophils Relative 0 %   Basophils Relative 0 %   Band Neutrophils 30 %   Metamyelocytes Relative 0 %   Myelocytes 0 %   Promyelocytes Absolute 0 %   Blasts 0 %   nRBC 0 0 /100 WBC   Other 9 %   Neutro Abs 8.2 (H) 1.7 - 7.7 K/uL   Lymphs Abs 0.5 (L) 0.7 - 4.0 K/uL   Monocytes Absolute 0.2 0.1 - 1.0 K/uL   Eosinophils Absolute 0.0 0.0 - 0.7 K/uL   Basophils Absolute 0.0 0.0 - 0.1 K/uL   RBC Morphology TEARDROP CELLS     Comment: BURR CELLS   WBC Morphology INCREASED BANDS (>20% BANDS)     Comment: ATYPICAL LYMPHOCYTES Performed at [facility], [place] [address] [place], [place] [address]   Comprehensive metabolic panel      Status: Abnormal   Collection Time: 04/02/2017  5:59 PM  Result Value Ref Range   Sodium 128 (L) 135 - 145 mmol/L   Potassium 5.7 (H) 3.5 - 5.1 mmol/L   Chloride 89 (L) 101 - 111 mmol/L   CO2 23 22 - 32 mmol/L   Glucose, Bld 172 (H) 65 - 99 mg/dL   BUN 75 (H) 6 - 20 mg/dL   Creatinine, Ser 6.10 (H) 0.44 - 1.00 mg/dL   Calcium 9.2 8.9 - 10.3 mg/dL   Total Protein 8.3 (H) 6.5 - 8.1 g/dL   Albumin 4.0 3.5 - 5.0 g/dL   AST 60 (H) 15 - 41 U/L   ALT 25 14 - 54 U/L   Alkaline Phosphatase 52 38 - 126 U/L   Total Bilirubin 0.4 0.3 - 1.2 mg/dL   GFR calc non Af Amer 7 (L) >60 mL/min   GFR calc Af Amer 8 (L) >60 mL/min    Comment: (NOTE) The eGFR has been calculated using the CKD EPI equation. This calculation has not been validated in all clinical situations. eGFR's persistently <60 mL/min signify possible Chronic Kidney Disease.    Anion gap 16 (H) 5 - 15    Comment: Performed at [facility], [place] [address] [place], [place] [address]  Brain natriuretic peptide     Status: None   Collection Time: 03/22/2017  5:59 PM  Result Value Ref Range   B Natriuretic Peptide 35.7 0.0 - 100.0 pg/mL    Comment: Performed at [facility], [place] [address], [place],  [address]  I-Stat Troponin, ED (not at [facility])     Status: None   Collection Time: 03/22/2017  6:04 PM  Result Value Ref Range   Troponin i, poc 0.00 0.00 - 0.08 ng/mL   Comment 3            Comment: Due to the release kinetics of cTnI, a negative result within the first hours of the onset of symptoms does not rule out myocardial infarction with certainty. If myocardial infarction is still suspected, repeat the test at appropriate intervals.   I-Stat CG4 Lactic Acid, ED     Status: Abnormal   Collection Time: 04/07/2017  6:06 PM  Result Value Ref Range   Lactic Acid, Venous 2.55 (HH) 0.5 - 1.9 mmol/L   Comment NOTIFIED PHYSICIAN   Blood gas, arterial ([facility] & [facility] ONLY)  Status: Abnormal    Collection Time: 03/19/2017  8:16 PM  Result Value Ref Range   O2 Content 8.0 L/min   Delivery systems AEROSOL MASK    pH, Arterial 7.218 (L) 7.350 - 7.450   pCO2 arterial 52.0 (H) 32.0 - 48.0 mmHg   pO2, Arterial 201 (H) 83.0 - 108.0 mmHg   Bicarbonate 20.4 20.0 - 28.0 mmol/L   Acid-base deficit 7.3 (H) 0.0 - 2.0 mmol/L   O2 Saturation 98.3 %   Patient temperature 98.6    Collection site RIGHT RADIAL    Drawn by [phone]    Sample type ARTERIAL DRAW    Allens test (pass/fail) PASS PASS    Comment: Performed at [facility], [place] [address] [place], [place] [address]  Blood culture (routine x 2)     Status: None (Preliminary result)   Collection Time: 03/16/2017  9:45 PM  Result Value Ref Range   Specimen Description BLOOD RIGHT FOREARM    Special Requests      BOTTLES DRAWN AEROBIC AND ANAEROBIC Blood Culture adequate volume Performed at [place] [address], [place], [place] [address]    Culture PENDING    Report Status PENDING   Influenza panel by PCR (type A & B)     Status: Abnormal   Collection Time: 03/19/2017  9:45 PM  Result Value Ref Range   Influenza A By PCR POSITIVE (A) NEGATIVE   Influenza B By PCR NEGATIVE NEGATIVE    Comment: (NOTE) The Xpert Xpress Flu assay is intended as an aid in the diagnosis of  influenza and should not be used as a sole basis for treatment.  This  assay is FDA approved for nasopharyngeal swab specimens only. Nasal  washings and aspirates are unacceptable for Xpert Xpress Flu testing. Performed at [facility], [place] [address], [place], [place] [address]   Ethanol     Status: None   Collection Time: 04/02/2017 10:00 PM  Result Value Ref Range   Alcohol, Ethyl (B) <10 <10 mg/dL    Comment:        LOWEST DETECTABLE LIMIT FOR SERUM ALCOHOL IS 10 mg/dL FOR MEDICAL PURPOSES ONLY Performed at [place] [address], [place], [place] [address]   I-Stat CG4  Lactic Acid, ED     Status: Abnormal   Collection Time: 03/30/2017 10:03 PM  Result Value Ref Range   Lactic Acid, Venous 3.30 (HH) 0.5 - 1.9 mmol/L   Comment NOTIFIED PHYSICIAN   Phosphorus     Status: Abnormal   Collection Time: 03/28/2017 10:35 PM  Result Value Ref Range   Phosphorus 7.3 (H) 2.5 - 4.6 mg/dL    Comment: Performed at [facility], [place] [address] [place], [place] [address]  Respiratory Panel by PCR     Status: Abnormal   Collection Time: 04/03/2017 10:38 PM  Result Value Ref Range   Adenovirus NOT DETECTED NOT DETECTED   Coronavirus 229E NOT DETECTED NOT DETECTED   Coronavirus HKU1 NOT DETECTED NOT DETECTED   Coronavirus NL63 NOT DETECTED NOT DETECTED   Coronavirus OC43 NOT DETECTED NOT DETECTED   Metapneumovirus NOT DETECTED NOT DETECTED   Rhinovirus / Enterovirus NOT DETECTED NOT DETECTED   Influenza A H1 2009 DETECTED (A) NOT DETECTED   Influenza B NOT DETECTED NOT DETECTED   Parainfluenza Virus 1 NOT DETECTED NOT DETECTED   Parainfluenza Virus 2 NOT DETECTED NOT DETECTED   Parainfluenza Virus 3 NOT DETECTED  NOT DETECTED   Parainfluenza Virus 4 NOT DETECTED NOT DETECTED   Respiratory Syncytial Virus NOT DETECTED NOT DETECTED   Bordetella pertussis NOT DETECTED NOT DETECTED   Chlamydophila pneumoniae NOT DETECTED NOT DETECTED   Mycoplasma pneumoniae NOT DETECTED NOT DETECTED    Comment: Performed at [place] [address], [place], [place] [address]  CBC     Status: None   Collection Time: 03/28/17  5:05 AM  Result Value Ref Range   WBC 9.9 4.0 - 10.5 K/uL   RBC 4.72 3.87 - 5.11 MIL/uL   Hemoglobin 13.3 12.0 - 15.0 g/dL   HCT 37.6 36.0 - 46.0 %   MCV 79.7 78.0 - 100.0 fL   MCH 28.2 26.0 - 34.0 pg   MCHC 35.4 30.0 - 36.0 g/dL   RDW 14.0 11.5 - 15.5 %   Platelets 176 150 - 400 K/uL    Comment: Performed at [facility], [place] [address] [place], [place] [address]  Basic metabolic panel     Status: Abnormal    Collection Time: 03/28/17  5:05 AM  Result Value Ref Range   Sodium 134 (L) 135 - 145 mmol/L   Potassium 5.6 (H) 3.5 - 5.1 mmol/L   Chloride 102 101 - 111 mmol/L   CO2 21 (L) 22 - 32 mmol/L   Glucose, Bld 143 (H) 65 - 99 mg/dL   BUN 62 (H) 6 - 20 mg/dL   Creatinine, Ser 4.63 (H) 0.44 - 1.00 mg/dL   Calcium 8.0 (L) 8.9 - 10.3 mg/dL   GFR calc non Af Amer 9 (L) >60 mL/min   GFR calc Af Amer 11 (L) >60 mL/min    Comment: (NOTE) The eGFR has been calculated using the CKD EPI equation. This calculation has not been validated in all clinical situations. eGFR's persistently <60 mL/min signify possible Chronic Kidney Disease.    Anion gap 11 5 - 15    Comment: Performed at [facility], [place] [address], [place], [place] [address]  Magnesium     Status: Abnormal   Collection Time: 03/28/17  5:05 AM  Result Value Ref Range   Magnesium 3.4 (H) 1.7 - 2.4 mg/dL    Comment: Performed at [facility], [place] [address], [place], [place] [address]  Phosphorus     Status: Abnormal   Collection Time: 03/28/17  5:05 AM  Result Value Ref Range   Phosphorus 6.7 (H) 2.5 - 4.6 mg/dL    Comment: Performed at [facility], [place] [address] [place], [place] [address]  Prealbumin     Status: None   Collection Time: 03/28/17  5:05 AM  Result Value Ref Range   Prealbumin 19.7 18 - 38 mg/dL    Comment: Performed at [place] [address] [place], [place] [address]  Urinalysis, Routine w reflex microscopic     Status: Abnormal   Collection Time: 03/28/17  6:07 AM  Result Value Ref Range   Color, Urine COLORLESS (A) YELLOW   APPearance CLEAR CLEAR   Specific Gravity, Urine 1.005 1.005 - 1.030   pH 6.0 5.0 - 8.0   Glucose, UA 50 (A) NEGATIVE mg/dL   Hgb urine dipstick SMALL (A) NEGATIVE   Bilirubin Urine NEGATIVE NEGATIVE   Ketones, ur NEGATIVE NEGATIVE mg/dL   Protein, ur NEGATIVE NEGATIVE mg/dL   Nitrite NEGATIVE NEGATIVE   Leukocytes,  UA NEGATIVE NEGATIVE   RBC / HPF 0-5 0 - 5 RBC/hpf  WBC, UA 0-5 0 - 5 WBC/hpf   Bacteria, UA RARE (A) NONE SEEN   Squamous Epithelial / LPF 0-5 (A) NONE SEEN    Comment: Performed at [facility], [place] [address], [place], [place] [address]  Urine rapid drug screen (hosp performed)     Status: None   Collection Time: 03/28/17  6:07 AM  Result Value Ref Range   Opiates NONE DETECTED NONE DETECTED   Cocaine NONE DETECTED NONE DETECTED   Benzodiazepines NONE DETECTED NONE DETECTED   Amphetamines NONE DETECTED NONE DETECTED   Tetrahydrocannabinol NONE DETECTED NONE DETECTED   Barbiturates NONE DETECTED NONE DETECTED    Comment: (NOTE) DRUG SCREEN FOR MEDICAL PURPOSES ONLY.  IF CONFIRMATION IS NEEDED FOR ANY PURPOSE, NOTIFY LAB WITHIN 5 DAYS. LOWEST DETECTABLE LIMITS FOR URINE DRUG SCREEN Drug Class                     Cutoff (ng/mL) Amphetamine and metabolites    1000 Barbiturate and metabolites    200 Benzodiazepine                 920 Tricyclics and metabolites     300 Opiates and metabolites        300 Cocaine and metabolites        300 THC                            50 Performed at [facility], [place] [address], [place], [place] [address]   Strep pneumoniae urinary antigen     Status: None   Collection Time: 03/28/17  6:07 AM  Result Value Ref Range   Strep Pneumo Urinary Antigen NEGATIVE NEGATIVE    Comment:        Infection due to S. pneumoniae cannot be absolutely ruled out since the antigen present may be below the detection limit of the test. Performed at [facility], [address], [place], [place] [address]    Dg Chest 2 View  Result Date: 03/24/2017 CLINICAL DATA:  Shortness of breath. EXAM: CHEST  2 VIEW COMPARISON:  December 28, 2016 FINDINGS: The study is limited due to over penetration. Scarring in the apices is stable. No pneumothorax. Hyperinflation of lungs consistent with emphysema/COPD. The cardiomediastinal  silhouette is stable. No acute infiltrates. IMPRESSION: COPD/emphysema.  Scarring in the apices.  No acute infiltrates. Electronically Signed   By: Dorise Bullion III M.D   On: 03/28/2017 19:19    Pending Labs Unresulted Labs (From admission, onward)   Start     Ordered   03/28/17 1354  Blood gas, arterial ([facility] & [facility] ONLY)  STAT,   STAT     03/28/17 1353   03/28/17 8832  Basic metabolic panel  Once,   R     03/28/17 0705   03/28/17 0522  HIV antibody  Once,   R     03/28/17 0522   03/25/2017 2240  Legionella Pneumophila Serogp 1 Ur Ag  Once,   R     03/29/2017 2241   03/31/2017 2237  Culture, sputum-assessment  Once,   R     04/08/2017 2241   03/12/2017 2237  Gram stain  Once,   R     03/23/2017 2241   03/19/2017 1937  Blood culture (routine x 2)  BLOOD CULTURE X 2,   STAT     03/30/2017 1936      Vitals/Pain  Today's Vitals   03/28/17 1100 03/28/17 1130 03/28/17 1230 03/28/17 1320  BP: (!) 140/108 (!) 162/113 (!) 151/101   Pulse:  (!) 29 68   Resp: 20 20 (!) 23   Temp:      TempSrc:      SpO2:  (!) 89% 91% 93%  PainSc:        Isolation Precautions Droplet precaution  Medications Medications  ipratropium-albuterol (DUONEB) 0.5-2.5 (3) MG/3ML nebulizer solution 3 mL (3 mLs Nebulization Not Given 03/18/2017 1944)  feeding supplement (ENSURE ENLIVE) (ENSURE ENLIVE) liquid 237 mL (not administered)  budesonide (PULMICORT) nebulizer solution 0.5 mg (0.5 mg Nebulization Given by Other 03/28/17 0808)  arformoterol (BROVANA) nebulizer solution 15 mcg (15 mcg Nebulization Not Given 03/28/17 0815)  heparin injection 5,000 Units (5,000 Units Subcutaneous Given 03/28/17 0625)  ondansetron (ZOFRAN) tablet 4 mg (not administered)    Or  ondansetron (ZOFRAN) injection 4 mg (not administered)  acetaminophen (TYLENOL) tablet 650 mg (not administered)    Or  acetaminophen (TYLENOL) suppository 650 mg (not administered)  cefTRIAXone (ROCEPHIN) 1 g in sodium chloride 0.9 % 100 mL IVPB (0 g Intravenous Stopped  03/28/17 0040)  azithromycin (ZITHROMAX) 500 mg in sodium chloride 0.9 % 250 mL IVPB (0 mg Intravenous Stopped 03/28/17 0244)  methylPREDNISolone sodium succinate (SOLU-MEDROL) 125 mg/2 mL injection 60 mg (60 mg Intravenous Given 03/28/17 0625)  famotidine (PEPCID) IVPB 20 mg premix (0 mg Intravenous Stopped 03/28/17 0330)  guaiFENesin (ROBITUSSIN) 100 MG/5ML solution 100 mg (not administered)  oseltamivir (TAMIFLU) 6 MG/ML suspension 30 mg (30 mg Oral Given 03/28/17 0628)  albuterol (PROVENTIL) (2.5 MG/3ML) 0.083% nebulizer solution 2.5 mg (2.5 mg Nebulization Given 03/28/17 1320)  albuterol (PROVENTIL) (2.5 MG/3ML) 0.083% nebulizer solution 2.5 mg (not administered)  tiotropium (SPIRIVA) inhalation capsule 18 mcg (not administered)  fluconazole (DIFLUCAN) IVPB 100 mg (100 mg Intravenous New Bag/Given 03/28/17 0923)  ipratropium-albuterol (DUONEB) 0.5-2.5 (3) MG/3ML nebulizer solution 3 mL (3 mLs Nebulization Given 03/13/2017 1758)  LORazepam (ATIVAN) injection 0.5 mg (0.5 mg Intravenous Given 03/14/2017 1754)  hydrocortisone sodium succinate (SOLU-CORTEF) 100 MG injection 100 mg (100 mg Intravenous Given 03/23/2017 1756)  sodium chloride 0.9 % bolus 500 mL (0 mLs Intravenous Stopped 03/17/2017 1916)  sodium chloride 0.9 % bolus 1,000 mL (0 mLs Intravenous Stopped 03/12/2017 2128)  0.9 %  sodium chloride infusion ( Intravenous New Bag/Given 04/06/2017 1938)  albuterol (PROVENTIL,VENTOLIN) solution continuous neb (10 mg/hr Nebulization Given 04/02/2017 1944)  ipratropium (ATROVENT) nebulizer solution 0.5 mg (0.5 mg Nebulization Given 03/20/2017 1944)  sodium chloride 0.9 % bolus 1,000 mL (0 mLs Intravenous Stopped 03/28/17 0123)  magnesium sulfate IVPB 2 g 50 mL (0 g Intravenous Stopped 03/28/17 0123)  sodium chloride 0.9 % bolus 500 mL (0 mLs Intravenous Stopped 03/28/17 0055)  fluconazole (DIFLUCAN) IVPB 200 mg (0 mg Intravenous Stopped 03/28/17 0330)    Mobility walks

## 2017-03-28 NOTE — Plan of Care (Signed)
  Education: Knowledge of General Education information will improve 03/28/2017 1939 - Progressing by Charlett Lango, RN   Clinical Measurements: Ability to maintain clinical measurements within normal limits will improve 03/28/2017 1939 - Progressing by Charlett Lango, RN Will remain free from infection 03/28/2017 1939 - Progressing by Charlett Lango, RN Diagnostic test results will improve 03/28/2017 1939 - Progressing by Charlett Lango, RN Respiratory complications will improve 03/28/2017 1939 - Progressing by Charlett Lango, RN Cardiovascular complication will be avoided 03/28/2017 1939 - Progressing by Charlett Lango, RN   Clinical Measurements: Will remain free from infection 03/28/2017 1939 - Progressing by Charlett Lango, RN   Clinical Measurements: Diagnostic test results will improve 03/28/2017 1939 - Progressing by Charlett Lango, RN   Clinical Measurements: Respiratory complications will improve 03/28/2017 1939 - Progressing by Charlett Lango, RN   Clinical Measurements: Cardiovascular complication will be avoided 03/28/2017 1939 - Progressing by Charlett Lango, RN   Activity Intolerance Adequate nutrition will be maintained 03/28/2017 1829 - Not Progressing by Charlett Lango, RN 03/28/2017 1718 - Progressing by Charlett Lango, RN

## 2017-03-28 NOTE — Progress Notes (Signed)
HR in 240's, sustained for <5 seconds X 2.  Tremors noted. No c/o chest pain. BP in the 150's systolic. EKG done. MD notified.Orders for prn Lopressor given. Will continue to monitor.

## 2017-03-28 NOTE — Progress Notes (Signed)
[facility] PROGRESS NOTE    Progress Note  [name]  JOA:[id number] DOB: 08/10/1955 DOA: 03/29/2017 PCP: Patient, No Pcp Per     Brief Narrative:   Jennifer Hurley is an 62 y.o. female past medical history of COPD chronic respiratory failure not oxygen dependent, chronic diastolic heart failure with an EF of 55%, adrenal insufficiency and history of alcohol and cocaine abuse who comes in from the skilled nursing facility for shortness of breath, flu panel was done which was positive.  Assessment/Plan:   Acute respiratory failure with hypercapnia (HCC) due to COPD exacerbation/influenza A positive: Agree with stepdown, culture data is pending.  She was started empirically on IV Solu-Medrol and steroids, on my examination she still has a respiration of 25 on 6 L of oxygen satting 95% and going about 130 on her heart rate. Continue IV empiric antibiotics, and continue BiPAP as needed. Will start on Spiriva and albuterol scheduled plus every hour as needed as needed.  Acute metabolic encephalopathy: Likely hypoxic in nature, alcohol level and UDS were negative  Anion gap metabolic acidosis: This is probably likely due to her hypoxia, she has never been hypotensive, she was started empirically on IV antibiotics, which we will continue. No need to continue to check lactic acid.  Acute kidney injury: Likely prerenal in etiology, baseline creatinine less than 1. Her creatinine has improved from 6 on admission to 4 we will continue IV fluid hydration continue to avoid NSAIDs and ACE inhibitor or ARB.  Oral thrush: Continue Diflucan IV, HIV screening is pending. Is likely due to steroids and malnutrition.  Adrenal insufficiency: Now she is on IV steroids which should supplement her stress dose.  Severe protein caloric malnutrition: Continue Ensure 3 times daily.  Hypovolemic hyponatremia: Seems to be improving with IV fluid hydration will continue this repeat basic  metabolic panel in the morning.  DVT prophylaxis: lovenox Family Communication:none Disposition Plan/Barrier to D/C: SDU Code Status:     Code Status Orders  (From admission, onward)        Start     Ordered   04/03/2017 2238  Full code  Continuous     04/02/2017 2241    Code Status History    Date Active Date Inactive Code Status Order ID Comments User Context   12/22/2016 23:27 01/01/2017 15:47 Full Code [id number]  Haydee Monica, MD ED   05/10/2015 02:12 05/15/2015 13:41 Full Code [id number]  Clydie Braun, MD Inpatient   04/25/2012 18:44 06/02/2012 16:21 Full Code [id number]  Wende Bushy Inpatient   04/12/2012 16:11 04/25/2012 18:44 Full Code [id number]  Renae Fickle, MD Inpatient   02/12/2012 21:24 02/17/2012 17:48 Full Code [id number]  Imogene Burn, RN Inpatient   11/03/2011 04:20 11/13/2011 15:58 Full Code [id number]  Gery Pray, MD ED   10/28/2011 18:25 11/02/2011 17:20 Full Code [id number]  Oda Kilts, RN Inpatient        IV Access:    Peripheral IV   Procedures and diagnostic studies:   Dg Chest 2 View  Result Date: 03/15/2017 CLINICAL DATA:  Shortness of breath. EXAM: CHEST  2 VIEW COMPARISON:  December 28, 2016 FINDINGS: The study is limited due to over penetration. Scarring in the apices is stable. No pneumothorax. Hyperinflation of lungs consistent with emphysema/COPD. The cardiomediastinal silhouette is stable. No acute infiltrates. IMPRESSION: COPD/emphysema.  Scarring in the apices.  No acute infiltrates. Electronically Signed   By: Gerome Sam III M.D   On: 04/05/2017 19:19  Medical Consultants:    None.  Anti-Infectives:   IV Rocephin in the central  Subjective:    Jennifer Hurley patient relates her breathing is better but not able to speak in full sentences on 2 L nasal cannula.  Objective:    Vitals:   03/28/17 0530 03/28/17 0600 03/28/17 0630 03/28/17 0700  BP: (!) 159/100 (!) 142/119 (!) 158/116 (!) 151/95  Pulse: (!) 113 (!)  116 (!) 116 (!) 114  Resp: (!) 21 (!) 22 (!) 21 20  Temp:      TempSrc:      SpO2: 100% 100% 99% 96%    Intake/Output Summary (Last 24 hours) at 03/28/2017 0725 Last data filed at 03/28/2017 0055 Gross per 24 hour  Intake 2000 ml  Output -  Net 2000 ml   There were no vitals filed for this visit.  Exam: General exam: In no acute distress. Respiratory system: Poor air movement and with crackles on the right Cardiovascular system: S1 & S2 heard, RRR. Gastrointestinal system: Abdomen is nondistended, soft and nontender.  Central nervous system: Alert and oriented. No focal neurological deficits. Extremities: No pedal edema. Skin: No rashes, lesions or ulcers  Data Reviewed:    Labs: Basic Metabolic Panel: Recent Labs  Lab 2017-04-23 1759 23-Apr-2017 2235 03/28/17 0505  NA 128*  --  134*  K 5.7*  --  5.6*  CL 89*  --  102  CO2 23  --  21*  GLUCOSE 172*  --  143*  BUN 75*  --  62*  CREATININE 6.10*  --  4.63*  CALCIUM 9.2  --  8.0*  MG  --   --  3.4*  PHOS  --  7.3* 6.7*   GFR CrCl cannot be calculated (Unknown ideal weight.). Liver Function Tests: Recent Labs  Lab April 23, 2017 1759  AST 60*  ALT 25  ALKPHOS 52  BILITOT 0.4  PROT 8.3*  ALBUMIN 4.0   No results for input(s): LIPASE, AMYLASE in the last 168 hours. No results for input(s): AMMONIA in the last 168 hours. Coagulation profile No results for input(s): INR, PROTIME in the last 168 hours.  CBC: Recent Labs  Lab 23-Apr-2017 1759 03/28/17 0505  WBC 9.8 9.9  NEUTROABS 8.2*  --   HGB 15.3* 13.3  HCT 41.6 37.6  MCV 77.8* 79.7  PLT 247 176   Cardiac Enzymes: No results for input(s): CKTOTAL, CKMB, CKMBINDEX, TROPONINI in the last 168 hours. BNP (last 3 results) No results for input(s): PROBNP in the last 8760 hours. CBG: No results for input(s): GLUCAP in the last 168 hours. D-Dimer: No results for input(s): DDIMER in the last 72 hours. Hgb A1c: No results for input(s): HGBA1C in the last 72  hours. Lipid Profile: No results for input(s): CHOL, HDL, LDLCALC, TRIG, CHOLHDL, LDLDIRECT in the last 72 hours. Thyroid function studies: No results for input(s): TSH, T4TOTAL, T3FREE, THYROIDAB in the last 72 hours.  Invalid input(s): FREET3 Anemia work up: No results for input(s): VITAMINB12, FOLATE, FERRITIN, TIBC, IRON, RETICCTPCT in the last 72 hours. Sepsis Labs: Recent Labs  Lab 04-23-2017 1759 04-23-17 1806 04/23/2017 2203 03/28/17 0505  WBC 9.8  --   --  9.9  LATICACIDVEN  --  2.55* 3.30*  --    Microbiology No results found for this or any previous visit (from the past 240 hour(s)).   Medications:   . arformoterol  15 mcg Nebulization BID  . budesonide (PULMICORT) nebulizer solution  0.5 mg Nebulization BID  .  feeding supplement (ENSURE ENLIVE)  237 mL Oral TID BM  . heparin  5,000 Units Subcutaneous Q8H  . ipratropium-albuterol  3 mL Nebulization Once  . ipratropium-albuterol  3 mL Nebulization QID  . methylPREDNISolone (SOLU-MEDROL) injection  60 mg Intravenous Q8H  . oseltamivir  30 mg Oral Daily   Continuous Infusions: . azithromycin Stopped (03/28/17 0244)  . cefTRIAXone (ROCEPHIN)  IV Stopped (03/28/17 0040)  . famotidine (PEPCID) IV Stopped (03/28/17 0330)  . sodium chloride    . sodium chloride       LOS: 1 day   Marinda Elk  [facility] Pager [phone]  *Please refer to [email or web address], password [email or web address] to get updated schedule on who will round on this patient, as hospitalists switch teams weekly. If 7PM-7AM, please contact night-coverage at [email or web address], password [email or web address] for any overnight needs.  03/28/2017, 7:25 AM

## 2017-03-29 ENCOUNTER — Inpatient Hospital Stay (HOSPITAL_COMMUNITY): Payer: Medicare Other

## 2017-03-29 DIAGNOSIS — J9602 Acute respiratory failure with hypercapnia: Secondary | ICD-10-CM

## 2017-03-29 DIAGNOSIS — N179 Acute kidney failure, unspecified: Secondary | ICD-10-CM

## 2017-03-29 DIAGNOSIS — B370 Candidal stomatitis: Secondary | ICD-10-CM

## 2017-03-29 LAB — BLOOD GAS, VENOUS
ACID-BASE DEFICIT: 14.9 mmol/L — AB (ref 0.0–2.0)
Acid-base deficit: 2.4 mmol/L — ABNORMAL HIGH (ref 0.0–2.0)
Bicarbonate: 13.3 mmol/L — ABNORMAL LOW (ref 20.0–28.0)
Bicarbonate: 22.1 mmol/L (ref 20.0–28.0)
DRAWN BY: 441261
Drawn by: 270211
FIO2: 40
O2 Saturation: 62.8 %
O2 Saturation: 83.5 %
PCO2 VEN: 45.4 mmHg (ref 44.0–60.0)
PEEP: 5 cmH2O
PH VEN: 7.37 (ref 7.250–7.430)
PO2 VEN: 40.1 mmHg (ref 32.0–45.0)
Patient temperature: 98.6
Patient temperature: 37
RATE: 18 resp/min
VT: 370 mL
pCO2, Ven: 39.2 mmHg — ABNORMAL LOW (ref 44.0–60.0)
pH, Ven: 7.094 — CL (ref 7.250–7.430)
pO2, Ven: 45.9 mmHg — ABNORMAL HIGH (ref 32.0–45.0)

## 2017-03-29 LAB — PHOSPHORUS
Phosphorus: 9.8 mg/dL — ABNORMAL HIGH (ref 2.5–4.6)
Phosphorus: 4.8 mg/dL — ABNORMAL HIGH (ref 2.5–4.6)

## 2017-03-29 LAB — BASIC METABOLIC PANEL
ANION GAP: 13 (ref 5–15)
ANION GAP: 14 (ref 5–15)
ANION GAP: 15 (ref 5–15)
BUN: 66 mg/dL — ABNORMAL HIGH (ref 6–20)
BUN: 68 mg/dL — ABNORMAL HIGH (ref 6–20)
BUN: 58 mg/dL — ABNORMAL HIGH (ref 6–20)
CALCIUM: 8.2 mg/dL — AB (ref 8.9–10.3)
CALCIUM: 7.7 mg/dL — AB (ref 8.9–10.3)
CO2: 21 mmol/L — AB (ref 22–32)
CO2: 23 mmol/L (ref 22–32)
CO2: 20 mmol/L — AB (ref 22–32)
Calcium: 8.5 mg/dL — ABNORMAL LOW (ref 8.9–10.3)
Chloride: 105 mmol/L (ref 101–111)
Chloride: 105 mmol/L (ref 101–111)
Chloride: 114 mmol/L — ABNORMAL HIGH (ref 101–111)
Creatinine, Ser: 3.59 mg/dL — ABNORMAL HIGH (ref 0.44–1.00)
Creatinine, Ser: 3.39 mg/dL — ABNORMAL HIGH (ref 0.44–1.00)
Creatinine, Ser: 2.65 mg/dL — ABNORMAL HIGH (ref 0.44–1.00)
GFR calc Af Amer: 16 mL/min — ABNORMAL LOW (ref >60)
GFR calc Af Amer: 21 mL/min — ABNORMAL LOW (ref >60)
GFR, EST AFRICAN AMERICAN: 15 mL/min — AB (ref >60)
GFR, EST NON AFRICAN AMERICAN: 13 mL/min — AB (ref >60)
GFR, EST NON AFRICAN AMERICAN: 14 mL/min — AB (ref >60)
GFR, EST NON AFRICAN AMERICAN: 18 mL/min — AB (ref >60)
GLUCOSE: 117 mg/dL — AB (ref 65–99)
Glucose, Bld: 117 mg/dL — ABNORMAL HIGH (ref 65–99)
Glucose, Bld: 103 mg/dL — ABNORMAL HIGH (ref 65–99)
POTASSIUM: 6.1 mmol/L — AB (ref 3.5–5.1)
Potassium: 5.9 mmol/L — ABNORMAL HIGH (ref 3.5–5.1)
Potassium: 4.3 mmol/L (ref 3.5–5.1)
SODIUM: 139 mmol/L (ref 135–145)
Sodium: 142 mmol/L (ref 135–145)
Sodium: 149 mmol/L — ABNORMAL HIGH (ref 135–145)

## 2017-03-29 LAB — BLOOD GAS, ARTERIAL
Acid-base deficit: 5.3 mmol/L — ABNORMAL HIGH (ref 0.0–2.0)
BICARBONATE: 25.3 mmol/L (ref 20.0–28.0)
Delivery systems: POSITIVE
FIO2: 40
O2 SAT: 93.5 %
PATIENT TEMPERATURE: 37
PCO2 ART: 75.3 mmHg — AB (ref 32.0–48.0)
PEEP: 5 cmH2O
PH ART: 7.152 — AB (ref 7.350–7.450)
PO2 ART: 80.2 mmHg — AB (ref 83.0–108.0)
PRESSURE CONTROL: 12 cmH2O
RATE: 12 resp/min

## 2017-03-29 LAB — GLUCOSE, CAPILLARY
GLUCOSE-CAPILLARY: 98 mg/dL (ref 65–99)
Glucose-Capillary: 111 mg/dL — ABNORMAL HIGH (ref 65–99)
Glucose-Capillary: 152 mg/dL — ABNORMAL HIGH (ref 65–99)

## 2017-03-29 LAB — LEGIONELLA PNEUMOPHILA SEROGP 1 UR AG: L. pneumophila Serogp 1 Ur Ag: NEGATIVE

## 2017-03-29 LAB — MAGNESIUM
Magnesium: 2.8 mg/dL — ABNORMAL HIGH (ref 1.7–2.4)
Magnesium: 2.1 mg/dL (ref 1.7–2.4)

## 2017-03-29 MED ORDER — DOCUSATE SODIUM 50 MG/5ML PO LIQD: 100.0000 mg | Freq: Two times a day (BID) | ORAL | Status: DC | PRN

## 2017-03-29 MED ORDER — MIDAZOLAM HCL 2 MG/2ML IJ SOLN
INTRAMUSCULAR | Status: AC
  Administered 2017-03-29: 1 mg
  Filled 2017-03-29: qty 2

## 2017-03-29 MED ORDER — FENTANYL CITRATE (PF) 100 MCG/2ML IJ SOLN
INTRAMUSCULAR | Status: AC
  Administered 2017-03-29: 50 ug
  Filled 2017-03-29: qty 2

## 2017-03-29 MED ORDER — SODIUM CHLORIDE 0.9 % IV SOLN
INTRAVENOUS | Status: DC
  Administered 2017-03-29 (×2): via INTRAVENOUS

## 2017-03-29 MED ORDER — IPRATROPIUM-ALBUTEROL 0.5-2.5 (3) MG/3ML IN SOLN
3.0000 mL | Freq: Four times a day (QID) | RESPIRATORY_TRACT | Status: DC
  Administered 2017-03-29 – 2017-04-04 (×25): 3 mL via RESPIRATORY_TRACT
  Filled 2017-03-29 (×25): qty 3

## 2017-03-29 MED ORDER — SODIUM POLYSTYRENE SULFONATE 15 GM/60ML PO SUSP
30.0000 g | Freq: Once | ORAL | Status: AC
  Administered 2017-03-29: 30 g
  Filled 2017-03-29: qty 120

## 2017-03-29 MED ORDER — SODIUM CHLORIDE 0.9 % IV BOLUS (SEPSIS)
1000.0000 mL | Freq: Once | INTRAVENOUS | Status: AC
  Administered 2017-03-29: 1000 mL via INTRAVENOUS

## 2017-03-29 MED ORDER — SODIUM CHLORIDE 0.9 % IV BOLUS (SEPSIS)
500.0000 mL | Freq: Once | INTRAVENOUS | Status: AC
  Administered 2017-03-29: 500 mL via INTRAVENOUS

## 2017-03-29 MED ORDER — DEXMEDETOMIDINE HCL IN NACL 200 MCG/50ML IV SOLN
0.0000 ug/kg/h | INTRAVENOUS | Status: AC
  Administered 2017-03-29: 0.4 ug/kg/h via INTRAVENOUS
  Administered 2017-03-30: 1.5 ug/kg/h via INTRAVENOUS
  Administered 2017-03-30: 1.2 ug/kg/h via INTRAVENOUS
  Administered 2017-03-30: 1.5 ug/kg/h via INTRAVENOUS
  Administered 2017-03-30: 1.2 ug/kg/h via INTRAVENOUS
  Administered 2017-03-31 (×2): 1 ug/kg/h via INTRAVENOUS
  Administered 2017-03-31: 1.5 ug/kg/h via INTRAVENOUS
  Administered 2017-03-31: 1.3 ug/kg/h via INTRAVENOUS
  Administered 2017-03-31 (×2): 1.5 ug/kg/h via INTRAVENOUS
  Administered 2017-04-01: 1.2 ug/kg/h via INTRAVENOUS
  Administered 2017-04-01: 0.8 ug/kg/h via INTRAVENOUS
  Filled 2017-03-29 (×16): qty 50

## 2017-03-29 MED ORDER — FENTANYL CITRATE (PF) 100 MCG/2ML IJ SOLN
25.0000 ug | INTRAMUSCULAR | Status: DC | PRN
  Administered 2017-03-29 (×2): 50 ug via INTRAVENOUS
  Administered 2017-03-29: 25 ug via INTRAVENOUS
  Administered 2017-03-29 – 2017-04-04 (×15): 50 ug via INTRAVENOUS
  Filled 2017-03-29 (×13): qty 2

## 2017-03-29 MED ORDER — VITAL AF 1.2 CAL PO LIQD
1000.0000 mL | ORAL | Status: DC
  Administered 2017-03-29: 1000 mL
  Filled 2017-03-29 (×2): qty 1000

## 2017-03-29 MED ORDER — HYDRALAZINE HCL 20 MG/ML IJ SOLN: 10.0000 mg | INTRAMUSCULAR | Status: DC | PRN

## 2017-03-29 MED ORDER — SODIUM POLYSTYRENE SULFONATE 15 GM/60ML PO SUSP
30.0000 g | Freq: Once | ORAL | Status: DC
  Filled 2017-03-29: qty 120

## 2017-03-29 MED ORDER — PRO-STAT SUGAR FREE PO LIQD
30.0000 mL | Freq: Two times a day (BID) | ORAL | Status: DC
  Filled 2017-03-29: qty 30

## 2017-03-29 MED ORDER — MIDAZOLAM HCL 2 MG/2ML IJ SOLN
1.0000 mg | INTRAMUSCULAR | Status: DC | PRN
  Administered 2017-03-29 – 2017-04-04 (×20): 1 mg via INTRAVENOUS
  Filled 2017-03-29 (×21): qty 2

## 2017-03-29 MED ORDER — VITAL HIGH PROTEIN PO LIQD
1000.0000 mL | ORAL | Status: DC
  Filled 2017-03-29: qty 1000

## 2017-03-29 MED ORDER — ORAL CARE MOUTH RINSE
15.0000 mL | Freq: Four times a day (QID) | OROMUCOSAL | Status: DC
  Administered 2017-03-29 – 2017-04-04 (×24): 15 mL via OROMUCOSAL

## 2017-03-29 MED ORDER — ETOMIDATE 2 MG/ML IV SOLN
10.0000 mg | Freq: Once | INTRAVENOUS | Status: AC
  Administered 2017-03-29: 10 mg via INTRAVENOUS

## 2017-03-29 MED ORDER — PHENYLEPHRINE HCL-NACL 10-0.9 MG/250ML-% IV SOLN
0.0000 ug/min | INTRAVENOUS | Status: DC
  Administered 2017-03-29: 20 ug/min via INTRAVENOUS
  Filled 2017-03-29 (×2): qty 250

## 2017-03-29 MED ORDER — ALBUTEROL SULFATE (2.5 MG/3ML) 0.083% IN NEBU
2.5000 mg | INHALATION_SOLUTION | Freq: Four times a day (QID) | RESPIRATORY_TRACT | Status: DC
  Administered 2017-03-29: 2.5 mg via RESPIRATORY_TRACT
  Filled 2017-03-29: qty 3

## 2017-03-29 MED ORDER — NEPRO/CARBSTEADY PO LIQD
1000.0000 mL | ORAL | Status: DC
  Administered 2017-03-30: 1000 mL
  Filled 2017-03-29 (×2): qty 1000

## 2017-03-29 MED ORDER — SODIUM BICARBONATE 8.4 % IV SOLN
INTRAVENOUS | Status: AC
  Administered 2017-03-29: 50 meq
  Filled 2017-03-29: qty 50

## 2017-03-29 MED ORDER — SODIUM CHLORIDE 0.9 % IV SOLN
INTRAVENOUS | Status: DC
  Administered 2017-03-29: 09:00:00 via INTRAVENOUS

## 2017-03-29 MED ORDER — CHLORHEXIDINE GLUCONATE 0.12% ORAL RINSE (MEDLINE KIT)
15.0000 mL | Freq: Two times a day (BID) | OROMUCOSAL | Status: DC
  Administered 2017-03-29 – 2017-04-04 (×13): 15 mL via OROMUCOSAL

## 2017-03-29 NOTE — Progress Notes (Signed)
[facility] PROGRESS NOTE    Progress Note  Jennifer Hurley  LKG:[id number] DOB: 1955-08-10 DOA: 04/07/2017 PCP: Patient, No Pcp Per     Brief Narrative:   Jennifer Hurley is an 62 y.o. female past medical history of COPD chronic respiratory failure not oxygen dependent, chronic diastolic heart failure with an EF of 55%, adrenal insufficiency and history of alcohol and cocaine abuse who comes in from the skilled nursing facility for shortness of breath, flu panel was done which was positive.  Assessment/Plan:   Acute respiratory failure with hypercapnia (HCC) due to COPD exacerbation/influenza A positive: Culture data is negative. She was started empirically on IV Solu-Medrol and steroids, and Tamiflu. Continue IV empiric antibiotics. Will start on Spiriva and albuterol scheduled plus every hour as needed as needed. She has remained afebrile, check an ABG.  Acute metabolic encephalopathy: It is more somnolent today, was on 100% saturations, recheck an ABG. Will need to control hospice and palliative care for end-of-life.  Anion gap metabolic acidosis: Now resolved continue empirically on IV antibiotics. . Acute kidney injury: Likely prerenal in etiology, baseline creatinine less than 1. Her creatinine has improved from 6 on admission, her Cr. is improving slowly we will continue IV fluid hydration recheck this afternoon.  Avoid NSAIDs and ACE inhibitor or ARB.  Oral thrush: Continue Diflucan IV, HIV screening is pending. Is likely due to steroids and malnutrition.  Adrenal insufficiency: Now she is on IV steroids which should supplement her stress dose.  Severe protein caloric malnutrition: Continue Ensure 3 times daily.  Hypovolemic hyponatremia: Related to hypovolemia now resolved.  Hyperkalemia: We will give her Kayexalate enemas, recheck a basic metabolic panel this afternoon.  DVT prophylaxis: lovenox Family Communication:none Disposition Plan/Barrier to  D/C: SDU Code Status:     Code Status Orders  (From admission, onward)        Start     Ordered   04/08/2017 2238  Full code  Continuous     03/26/2017 2241    Code Status History    Date Active Date Inactive Code Status Order ID Comments User Context   12/22/2016 23:27 01/01/2017 15:47 Full Code [id number]  Haydee Monica, MD ED   05/10/2015 02:12 05/15/2015 13:41 Full Code [id number]  Clydie Braun, MD Inpatient   04/25/2012 18:44 06/02/2012 16:21 Full Code [id number]  Wende Bushy Inpatient   04/12/2012 16:11 04/25/2012 18:44 Full Code [id number]  Renae Fickle, MD Inpatient   02/12/2012 21:24 02/17/2012 17:48 Full Code [id number]  Imogene Burn, RN Inpatient   11/03/2011 04:20 11/13/2011 15:58 Full Code [id number]  Gery Pray, MD ED   10/28/2011 18:25 11/02/2011 17:20 Full Code [id number]  Oda Kilts, RN Inpatient        IV Access:    Peripheral IV   Procedures and diagnostic studies:   Dg Chest 2 View  Result Date: 03/14/2017 CLINICAL DATA:  Shortness of breath. EXAM: CHEST  2 VIEW COMPARISON:  December 28, 2016 FINDINGS: The study is limited due to over penetration. Scarring in the apices is stable. No pneumothorax. Hyperinflation of lungs consistent with emphysema/COPD. The cardiomediastinal silhouette is stable. No acute infiltrates. IMPRESSION: COPD/emphysema.  Scarring in the apices.  No acute infiltrates. Electronically Signed   By: Gerome Sam III M.D   On: 04/03/2017 19:19     Medical Consultants:    None.  Anti-Infectives:   IV Rocephin in the central  Subjective:    Jennifer Hurley patient more somnolent today.  Objective:  Vitals:   03/29/17 0000 03/29/17 0007 03/29/17 0400 03/29/17 0700  BP: 121/79  132/86 105/67  Pulse: 100  (!) 102   Resp: (!) 24  (!) 22 (!) 23  Temp:  (!) 97.5 F (36.4 C) (!) 97 F (36.1 C)   TempSrc:  Oral Axillary   SpO2: 96%  94%   Weight:      Height:        Intake/Output Summary (Last 24 hours) at 03/29/2017  0750 Last data filed at 03/29/2017 0700 Gross per 24 hour  Intake 2410 ml  Output -  Net 2410 ml   Filed Weights   03/28/17 1532  Weight: 34.7 kg (76 lb 8 oz)    Exam: General exam: In no acute distress. Respiratory system: Poor air movement and with crackles on the right Cardiovascular system: S1 & S2 heard, RRR. Gastrointestinal system: Abdomen is nondistended, soft and nontender.  Central nervous system: Alert and oriented. No focal neurological deficits. Extremities: No pedal edema. Skin: No rashes, lesions or ulcers  Data Reviewed:    Labs: Basic Metabolic Panel: Recent Labs  Lab 03/28/2017 1759 04/02/2017 2235 03/28/17 0505 03/28/17 1538 03/29/17 0309  NA 128*  --  134* 138 139  K 5.7*  --  5.6* 5.5* 5.9*  CL 89*  --  102 101 105  CO2 23  --  21* 22 21*  GLUCOSE 172*  --  143* 103* 117*  BUN 75*  --  62* 64* 66*  CREATININE 6.10*  --  4.63* 4.39* 3.59*  CALCIUM 9.2  --  8.0* 9.0 8.2*  MG  --   --  3.4*  --   --   PHOS  --  7.3* 6.7*  --   --    GFR Estimated Creatinine Clearance: 9.1 mL/min (A) (by C-G formula based on SCr of 3.59 mg/dL (H)). Liver Function Tests: Recent Labs  Lab 04/02/2017 1759  AST 60*  ALT 25  ALKPHOS 52  BILITOT 0.4  PROT 8.3*  ALBUMIN 4.0   No results for input(s): LIPASE, AMYLASE in the last 168 hours. No results for input(s): AMMONIA in the last 168 hours. Coagulation profile No results for input(s): INR, PROTIME in the last 168 hours.  CBC: Recent Labs  Lab 03/17/2017 1759 03/28/17 0505  WBC 9.8 9.9  NEUTROABS 8.2*  --   HGB 15.3* 13.3  HCT 41.6 37.6  MCV 77.8* 79.7  PLT 247 176   Cardiac Enzymes: No results for input(s): CKTOTAL, CKMB, CKMBINDEX, TROPONINI in the last 168 hours. BNP (last 3 results) No results for input(s): PROBNP in the last 8760 hours. CBG: No results for input(s): GLUCAP in the last 168 hours. D-Dimer: No results for input(s): DDIMER in the last 72 hours. Hgb A1c: No results for input(s):  HGBA1C in the last 72 hours. Lipid Profile: No results for input(s): CHOL, HDL, LDLCALC, TRIG, CHOLHDL, LDLDIRECT in the last 72 hours. Thyroid function studies: No results for input(s): TSH, T4TOTAL, T3FREE, THYROIDAB in the last 72 hours.  Invalid input(s): FREET3 Anemia work up: No results for input(s): VITAMINB12, FOLATE, FERRITIN, TIBC, IRON, RETICCTPCT in the last 72 hours. Sepsis Labs: Recent Labs  Lab 03/26/2017 1759 04/01/2017 1806 03/17/2017 2203 03/28/17 0505  WBC 9.8  --   --  9.9  LATICACIDVEN  --  2.55* 3.30*  --    Microbiology Recent Results (from the past 240 hour(s))  Blood culture (routine x 2)     Status: None (Preliminary result)  Collection Time: April 08, 2017  9:45 PM  Result Value Ref Range Status   Specimen Description BLOOD RIGHT FOREARM  Final   Special Requests   Final    BOTTLES DRAWN AEROBIC AND ANAEROBIC Blood Culture adequate volume Performed at [facility], [address], [place], [place] [address]    Culture PENDING  Incomplete   Report Status PENDING  Incomplete  Respiratory Panel by PCR     Status: Abnormal   Collection Time: Apr 08, 2017 10:38 PM  Result Value Ref Range Status   Adenovirus NOT DETECTED NOT DETECTED Final   Coronavirus 229E NOT DETECTED NOT DETECTED Final   Coronavirus HKU1 NOT DETECTED NOT DETECTED Final   Coronavirus NL63 NOT DETECTED NOT DETECTED Final   Coronavirus OC43 NOT DETECTED NOT DETECTED Final   Metapneumovirus NOT DETECTED NOT DETECTED Final   Rhinovirus / Enterovirus NOT DETECTED NOT DETECTED Final   Influenza A H1 2009 DETECTED (A) NOT DETECTED Final   Influenza B NOT DETECTED NOT DETECTED Final   Parainfluenza Virus 1 NOT DETECTED NOT DETECTED Final   Parainfluenza Virus 2 NOT DETECTED NOT DETECTED Final   Parainfluenza Virus 3 NOT DETECTED NOT DETECTED Final   Parainfluenza Virus 4 NOT DETECTED NOT DETECTED Final   Respiratory Syncytial Virus NOT DETECTED NOT DETECTED Final   Bordetella  pertussis NOT DETECTED NOT DETECTED Final   Chlamydophila pneumoniae NOT DETECTED NOT DETECTED Final   Mycoplasma pneumoniae NOT DETECTED NOT DETECTED Final    Comment: Performed at [facility], [address], [place], [place] [address]  MRSA PCR Screening     Status: None   Collection Time: 03/28/17 10:10 PM  Result Value Ref Range Status   MRSA by PCR NEGATIVE NEGATIVE Final    Comment:        The GeneXpert MRSA Assay (FDA approved for NASAL specimens only), is one component of a comprehensive MRSA colonization surveillance program. It is not intended to diagnose MRSA infection nor to guide or monitor treatment for MRSA infections. Performed at [facility], [address] [place], [place] [address]      Medications:   . albuterol  2.5 mg Nebulization Q6H  . arformoterol  15 mcg Nebulization BID  . budesonide (PULMICORT) nebulizer solution  0.5 mg Nebulization BID  . feeding supplement (ENSURE ENLIVE)  237 mL Oral TID BM  . heparin  5,000 Units Subcutaneous Q8H  . Influenza vac split quadrivalent PF  0.5 mL Intramuscular Tomorrow-1000  . ipratropium-albuterol  3 mL Nebulization Once  . mouth rinse  15 mL Mouth Rinse BID  . methylPREDNISolone (SOLU-MEDROL) injection  60 mg Intravenous Q8H  . metoprolol tartrate  5 mg Intravenous Q8H  . oseltamivir  30 mg Oral Daily  . pneumococcal 23 valent vaccine  0.5 mL Intramuscular Tomorrow-1000  . tiotropium  18 mcg Inhalation Daily   Continuous Infusions: . azithromycin Stopped (03/28/17 2252)  . cefTRIAXone (ROCEPHIN)  IV Stopped (03/28/17 2315)  . famotidine (PEPCID) IV Stopped (03/28/17 2221)  . fluconazole (DIFLUCAN) IV Stopped (03/28/17 1433)     LOS: 2 days   Marinda Elk  [facility] Pager [phone]  *Please refer to [email or web address], password [email or web address] to get updated schedule on who will round on this patient, as hospitalists switch teams weekly. If 7PM-7AM, please contact  night-coverage at [email or web address], password [email or web address] for any overnight needs.  03/29/2017, 7:50 AM

## 2017-03-29 NOTE — Progress Notes (Signed)
eLink Physician-Brief Progress Note Patient Name: Jennifer Hurley DOB: 11-08-1955 MRN: [id number]   Date of Service  03/29/2017  HPI/Events of Note  Multiple issues: 1. Temp = 94.1 F - Request for [employer] and 2. Agitation.   eICU Interventions  Will order: 1. [employer].  2. Increase ceiling dose on Precedex to 1.7 mcg/kg/hour.      Intervention Category Minor Interventions: Agitation / anxiety - evaluation and management  Sommer,Steven Eugene 03/29/2017, 11:50 PM

## 2017-03-29 NOTE — Consult Note (Signed)
PULMONARY / CRITICAL CARE MEDICINE   Name: Jennifer Hurley MRN: [id number] DOB: [birth year]    ADMISSION DATE:  23-Apr-2017 CONSULTATION DATE:  03/29/17  REFERRING MD:  Dr. David Stall / [facility]   CHIEF COMPLAINT:  Respiratory Distress   HISTORY OF PRESENT ILLNESS:   62 y/o F, SNF resident, smoker who presented to [facility] on 2/16 with reports of shortness of breath.    The patient has a history of chronic respiratory failure with prior tracheostomy, ETOH / cocaine abuse, HTN, CHF (LVEF 55%, PA peak pressure 37 04/2012) and adrenal insufficiency.  On presentation, the patient reported decreased appetite, feeling poorly, non-productive cough and shortness of breath.  She also reported that her mother had recently died and she was depressed.    She was admitted per [facility] for suspected COPD exacerbation.  Further evaluation revealed she was influenza A positive.  She was treated with nebulized bronchodilators, tamiflu, IV steroids and mucinex.  In addition, she was given diflucan for oral candidiasis.  The patient developed progressive hypercarbia requiring bipap.  UDS negative on admit.   PCCM consulted 2/18 for evaluation.   PAST MEDICAL HISTORY :  She  has a past medical history of Alcohol abuse, Bronchitis, Chronic respiratory failure (HCC), Cocaine abuse (HCC), COPD (chronic obstructive pulmonary disease) (HCC), Hyponatremia, Myocardial infarction (HCC), Pneumonia, Rheumatoid arthritis(714.0), and Tobacco abuse.  PAST SURGICAL HISTORY: She  has a past surgical history that includes Tonsillectomy and Tubal ligation.  No Known Allergies  No current facility-administered medications on file prior to encounter.    Current Outpatient Medications on File Prior to Encounter  Medication Sig  . acetaminophen (TYLENOL) 325 MG tablet Take 2 tablets (650 mg total) by mouth every 6 (six) hours as needed for mild pain (or Fever >/= 101).  Marland Kitchen albuterol (PROVENTIL HFA;VENTOLIN HFA) 108 (90 Base) MCG/ACT inhaler  Inhale 2 puffs into the lungs every 6 (six) hours as needed for wheezing or shortness of breath.  Marland Kitchen albuterol (PROVENTIL) (2.5 MG/3ML) 0.083% nebulizer solution Take 2.5 mg by nebulization every 6 (six) hours as needed for wheezing or shortness of breath.  . benzonatate (TESSALON) 100 MG capsule Take 100 mg by mouth 3 (three) times daily as needed for cough.  . budesonide-formoterol (SYMBICORT) 160-4.5 MCG/ACT inhaler Inhale 2 puffs into the lungs 2 (two) times daily. (0900 & 2100)  . cholecalciferol (VITAMIN D) 1000 units tablet Take 1 tablet (1,000 Units total) by mouth daily. (Patient taking differently: Take 1,000 Units by mouth daily. (0900))  . famotidine (PEPCID) 20 MG tablet Take 20 mg by mouth 2 (two) times daily. (0900 & 2100)  . feeding supplement, ENSURE ENLIVE, (ENSURE ENLIVE) LIQD Take 237 mLs by mouth 3 (three) times daily between meals.  . hydrocortisone (CORTEF) 20 MG tablet Take 3 tablets (60 mg total) by mouth every morning AND 1.5 tablets (30 mg total) every evening. (Patient taking differently: Take 3 tablets (60 mg total) by mouth every morning AND 1.5 tablets (30 mg total) every evening. (0900 &2100))  . magnesium oxide (MAG-OX) 400 (241.3 Mg) MG tablet Take 1 tablet (400 mg total) by mouth 2 (two) times daily. (Patient taking differently: Take 400 mg by mouth 2 (two) times daily. (0900 & 2100))  . Multiple Vitamin (MULTIVITAMIN WITH MINERALS) TABS tablet Take 1 tablet by mouth daily. (Patient taking differently: Take 1 tablet by mouth daily. (0900))  . Nutritional Supplements (RESOURCE 2.0 PO) Take 120 mLs by mouth 3 (three) times daily. With med pass  . phosphorus (  K PHOS NEUTRAL) 155-852-130 MG tablet Take 1 tablet (250 mg total) by mouth 2 (two) times daily. (Patient taking differently: Take 250 mg by mouth 2 (two) times daily. (0900 & 2100))  . potassium chloride SA (K-DUR,KLOR-CON) 20 MEQ tablet Take 1 tablet (20 mEq total) by mouth 2 (two) times daily. (Patient taking  differently: Take 20 mEq by mouth 2 (two) times daily. (0900 & 2100))  . sennosides-docusate sodium (SENOKOT-S) 8.6-50 MG tablet Take 1 tablet by mouth at bedtime. (2100)  . tiotropium (SPIRIVA) 18 MCG inhalation capsule Place 1 capsule (18 mcg total) into inhaler and inhale daily.  . mometasone-formoterol (DULERA) 100-5 MCG/ACT AERO Inhale 2 puffs into the lungs 2 (two) times daily. (Patient not taking: Reported on 04/01/2017)  . [DISCONTINUED] budesonide (PULMICORT) 0.5 MG/2ML nebulizer solution Take 2 mLs (0.5 mg total) by nebulization 2 (two) times daily. (Patient not taking: Reported on 05/09/2015)    FAMILY HISTORY:  Her    SOCIAL HISTORY: She  reports that she has been smoking cigarettes.  She has a 39.00 pack-year smoking history. she has never used smokeless tobacco. She reports that she drinks alcohol. She reports that she uses drugs. Drugs: Cocaine and Marijuana.  REVIEW OF SYSTEMS:  Unable to perform as patient is altered on BiPAP.    SUBJECTIVE:   VITAL SIGNS: BP 122/87   Pulse (!) 102   Temp (!) 96.9 F (36.1 C) (Axillary)   Resp (!) 35   Ht 5\' 1"  (1.549 m)   Wt 76 lb 8 oz (34.7 kg)   SpO2 98%   BMI 14.45 kg/m   HEMODYNAMICS:    VENTILATOR SETTINGS: Vent Mode: BIPAP;PCV FiO2 (%):  [40 %] 40 % Set Rate:  [12 bmp] 12 bmp PEEP:  [5 cmH20] 5 cmH20  INTAKE / OUTPUT: I/O last 3 completed shifts: In: 4410 [P.O.:120; I.V.:1840; IV Piggyback:2450] Out: -   PHYSICAL EXAMINATION: General: cachectic adult female in on BiPAP Neuro:  Arouses to stimulation, opens eyes, no follow commands, drifts back to sleep HEENT:  MM pink/dry Cardiovascular:  s1s2 regular, tachy Lungs: increased respiratory effort, lungs bilaterally diminished / poor air movement, faint basilar crackles  Abdomen:  Soft, non-tender  Musculoskeletal:  No acute deformities  Skin:  Warm/dry, flaky, no edema  LABS:  BMET Recent Labs  Lab 03/28/17 1538 03/29/17 0309 03/29/17 0812  NA 138 139  142  K 5.5* 5.9* 6.1*  CL 101 105 105  CO2 22 21* 23  BUN 64* 66* 68*  CREATININE 4.39* 3.59* 3.39*  GLUCOSE 103* 117* 103*    Electrolytes Recent Labs  Lab 04/01/2017 2235 03/28/17 0505 03/28/17 1538 03/29/17 0309 03/29/17 0812  CALCIUM  --  8.0* 9.0 8.2* 8.5*  MG  --  3.4*  --   --   --   PHOS 7.3* 6.7*  --   --   --     CBC Recent Labs  Lab 03/16/2017 1759 03/28/17 0505  WBC 9.8 9.9  HGB 15.3* 13.3  HCT 41.6 37.6  PLT 247 176    Coag's No results for input(s): APTT, INR in the last 168 hours.  Sepsis Markers Recent Labs  Lab 04/06/2017 1806 03/21/2017 2203  LATICACIDVEN 2.55* 3.30*    ABG Recent Labs  Lab 03/21/2017 2016 03/28/17 1420 03/29/17 1019  PHART 7.218* 7.224* 7.152*  PCO2ART 52.0* 52.2* 75.3*  PO2ART 201* 104 80.2*    Liver Enzymes Recent Labs  Lab 04/07/2017 1759  AST 60*  ALT 25  ALKPHOS 52  BILITOT 0.4  ALBUMIN 4.0    Cardiac Enzymes No results for input(s): TROPONINI, PROBNP in the last 168 hours.  Glucose No results for input(s): GLUCAP in the last 168 hours.  Imaging No results found.   STUDIES:    CULTURES: Respiratory Viral Panel 2/16 >> positive for influenza A  BCx2 2/16 >>  Tracheal Aspirate 2/18 >>   ANTIBIOTICS: Rocephin 2/16 >> 2/23  Azithro 2/16 >> 2/23  Tamiflu 2/17 >>  Diflucan 2/17 >> 2/18   SIGNIFICANT EVENTS: 2/16  Admit with resp distress, influenza A   LINES/TUBES: ETT 2/18 >>   DISCUSSION: 62 y/o F with PMH of substance abuse, chronic respiratory failure with prior trach admitted on 2/16 with increased SOB, non-productive cough.  Found to be positive for influenza A.  Progressed to BiPAP needs 2/18.    ASSESSMENT / PLAN:  PULMONARY A: Acute Hypercapnic Respiratory Failure Influenza A Positive  COPD  Pulmonary HTN - by ECHO in 2104  Tobacco / Substance Abuse P:   Now intubation  PRVC 8 cc/kg Wean PEEP / FiO2 for sats 88-94% ABG one hour post intubation  PCXR post intubation   Continue Brovana + Pulmicort  Continue solumedrol 60 mg IV Q8  Agree with palliative input Daily SBT / WUA   CARDIOVASCULAR A:  Hx Cocaine Abuse - UDS negative on admit, daughter reports her "old friends come check her out of the SNF" Tachycardia - ? Beta from nebs + volume depletion P:  ICU monitoring  NS @ 75 ml/hr  RENAL A:   AKI - in setting of poor PO intake / volume depletion, improving.  Baseline 0.4 Hyperkalemia - suspect in setting of AKI / acidemia.  Anticipate will correct with correction of acidosis.   P:   Trend BMP / urinary output Replace electrolytes as indicated Avoid nephrotoxic agents, ensure adequate renal perfusion Hold kayexalate for now  GASTROINTESTINAL A:   Severe Protein Calorie Malnutrition  P:   Insert feeding tube post intubation  NPO / OGT  TF per Nutrition  Monitor for re-feeding syndrome  HEMATOLOGIC A:   No acute issues  P:  Trend CBC  Heparin for DVT prophylaxis   INFECTIOUS A:   Influenza A Positive P:   Tamiflu as above  Discontinue abx, monitor   ENDOCRINE A:   At Risk Hyper/Hypoglycemia  P:   Trend glucose   NEUROLOGIC A:   Acute Metabolic Encephalopathy - in setting of hypercarbia / influenza A  P:   RASS goal: 0 to -1  PRN fentanyl / versed for RASS goal above > lower doses due to weight  PT efforts    FAMILY  - Updates: Daughter [name]) updated via phone.  Reviewed concept of intubation, CPR and patients current medical status.  She and her brothers are going to come up later today.  Will continue further goals of care discussion at that time.    - Inter-disciplinary family meet or Palliative Care meeting due by:  ongoing    [name], NP-C [facility] Pgr: [phone] or if no answer [phone] 03/29/2017, 10:33 AM

## 2017-03-29 NOTE — Consult Note (Signed)
Renal Service Consult Note [facility]  Jennifer Hurley 03/29/2017 Maree Krabbe Requesting Physician:  Dr Kendrick Fries  Reason for Consult:  Renal failure HPI: The patient is a 62 y.o. year-old with hx of COPD, chronic resp failure, etoh/ cocaine abuse presented from SNF with SOB on 2/16.  Eval showed creat 6.1, ABG 7.22/ 52/ 201 on facemask. CXR was negative except for COPD.   She was + for flu A and was wheezing.  Was admitted and treated for COPD exac w steroids, nebs and O2.  MS was altered.  Renal failure was suspected to be prerenal.  IVF's were started.    Creat today is down to 3.39, but K is rising, up to 6.1 today.  Asked to see for ^K and renal failure.   Pt on vent, provides no hx.    UA 2/17 > clear , 1.005, 0-5 wbc/ rbc, neg protein  Baseline creat is up 0.3- 0.6 over the last several yrs.  Had one episode of AKI 1.60 > 0.60 during Nov 2018 hosp stay, and another episode AKI in March 2017 w/ creat 1.20 > 0.50.    Home meds aer mostly COPD related medications, +PPI, no nsaids and no ACEi/ or ARB.  No antiHTN'sives.      ROS  n/a   Past Medical History  Past Medical History:  Diagnosis Date  . Alcohol abuse   . Bronchitis   . Chronic respiratory failure (HCC)   . Cocaine abuse (HCC)   . COPD (chronic obstructive pulmonary disease) (HCC)   . Hyponatremia   . Myocardial infarction (HCC)   . Pneumonia   . Rheumatoid arthritis(714.0)   . Tobacco abuse    Past Surgical History  Past Surgical History:  Procedure Laterality Date  . TONSILLECTOMY    . TUBAL LIGATION     Family History  Family History  Problem Relation Age of Onset  . Coronary artery disease Unknown   . Diabetes type II Unknown    Social History  reports that she has been smoking cigarettes.  She has a 39.00 pack-year smoking history. she has never used smokeless tobacco. She reports that she drinks alcohol. She reports that she uses drugs. Drugs: Cocaine and  Marijuana. Allergies No Known Allergies Home medications Prior to Admission medications   Medication Sig Start Date End Date Taking? Authorizing Provider  acetaminophen (TYLENOL) 325 MG tablet Take 2 tablets (650 mg total) by mouth every 6 (six) hours as needed for mild pain (or Fever >/= 101). 05/15/15  Yes Vann, Jessica U, DO  albuterol (PROVENTIL HFA;VENTOLIN HFA) 108 (90 Base) MCG/ACT inhaler Inhale 2 puffs into the lungs every 6 (six) hours as needed for wheezing or shortness of breath.   Yes [provider]  albuterol (PROVENTIL) (2.5 MG/3ML) 0.083% nebulizer solution Take 2.5 mg by nebulization every 6 (six) hours as needed for wheezing or shortness of breath.   Yes [provider]  benzonatate (TESSALON) 100 MG capsule Take 100 mg by mouth 3 (three) times daily as needed for cough.   Yes [provider]  budesonide-formoterol (SYMBICORT) 160-4.5 MCG/ACT inhaler Inhale 2 puffs into the lungs 2 (two) times daily. (0900 & 2100)   Yes [provider]  cholecalciferol (VITAMIN D) 1000 units tablet Take 1 tablet (1,000 Units total) by mouth daily. Patient taking differently: Take 1,000 Units by mouth daily. (0900) 01/02/17  Yes Tyrone Nine, MD  famotidine (PEPCID) 20 MG tablet Take 20 mg by mouth 2 (  two) times daily. (0900 & 2100)   Yes [provider]  feeding supplement, ENSURE ENLIVE, (ENSURE ENLIVE) LIQD Take 237 mLs by mouth 3 (three) times daily between meals. 01/01/17  Yes Tyrone Nine, MD  hydrocortisone (CORTEF) 20 MG tablet Take 3 tablets (60 mg total) by mouth every morning AND 1.5 tablets (30 mg total) every evening. Patient taking differently: Take 3 tablets (60 mg total) by mouth every morning AND 1.5 tablets (30 mg total) every evening. (0900 &2100) 01/01/17  Yes Tyrone Nine, MD  magnesium oxide (MAG-OX) 400 (241.3 Mg) MG tablet Take 1 tablet (400 mg total) by mouth 2 (two) times daily. Patient taking differently: Take 400 mg by mouth  2 (two) times daily. (0900 & 2100) 01/01/17  Yes Tyrone Nine, MD  Multiple Vitamin (MULTIVITAMIN WITH MINERALS) TABS tablet Take 1 tablet by mouth daily. Patient taking differently: Take 1 tablet by mouth daily. (0900) 01/02/17  Yes Tyrone Nine, MD  Nutritional Supplements (RESOURCE 2.0 PO) Take 120 mLs by mouth 3 (three) times daily. With med pass   Yes [provider]  phosphorus (K PHOS NEUTRAL) 155-852-130 MG tablet Take 1 tablet (250 mg total) by mouth 2 (two) times daily. Patient taking differently: Take 250 mg by mouth 2 (two) times daily. (0900 & 2100) 01/01/17  Yes Tyrone Nine, MD  potassium chloride SA (K-DUR,KLOR-CON) 20 MEQ tablet Take 1 tablet (20 mEq total) by mouth 2 (two) times daily. Patient taking differently: Take 20 mEq by mouth 2 (two) times daily. (0900 & 2100) 01/01/17  Yes Tyrone Nine, MD  sennosides-docusate sodium (SENOKOT-S) 8.6-50 MG tablet Take 1 tablet by mouth at bedtime. (2100)   Yes [provider]  tiotropium (SPIRIVA) 18 MCG inhalation capsule Place 1 capsule (18 mcg total) into inhaler and inhale daily. 01/02/17  Yes Tyrone Nine, MD  mometasone-formoterol (DULERA) 100-5 MCG/ACT AERO Inhale 2 puffs into the lungs 2 (two) times daily. Patient not taking: Reported on April 09, 2017 01/01/17   Tyrone Nine, MD  budesonide (PULMICORT) 0.5 MG/2ML nebulizer solution Take 2 mLs (0.5 mg total) by nebulization 2 (two) times daily. Patient not taking: Reported on 05/09/2015 04/25/12 08/01/15  Simonne Martinet, NP   Liver Function Tests Recent Labs  Lab 04-09-17 1759  AST 60*  ALT 25  ALKPHOS 52  BILITOT 0.4  PROT 8.3*  ALBUMIN 4.0   No results for input(s): LIPASE, AMYLASE in the last 168 hours. CBC Recent Labs  Lab Apr 09, 2017 1759 03/28/17 0505  WBC 9.8 9.9  NEUTROABS 8.2*  --   HGB 15.3* 13.3  HCT 41.6 37.6  MCV 77.8* 79.7  PLT 247 176   Basic Metabolic Panel Recent Labs  Lab 04-09-17 1759 2017/04/09 2235 03/28/17 0505  03/28/17 1538 03/29/17 0309 03/29/17 0812  NA 128*  --  134* 138 139 142  K 5.7*  --  5.6* 5.5* 5.9* 6.1*  CL 89*  --  102 101 105 105  CO2 23  --  21* 22 21* 23  GLUCOSE 172*  --  143* 103* 117* 103*  BUN 75*  --  62* 64* 66* 68*  CREATININE 6.10*  --  4.63* 4.39* 3.59* 3.39*  CALCIUM 9.2  --  8.0* 9.0 8.2* 8.5*  PHOS  --  7.3* 6.7*  --   --  9.8*   Iron/TIBC/Ferritin/ %Sat    Component Value Date/Time   IRON 21 (L) 05/10/2015 0808   TIBC 231 (L) 05/10/2015 8182  IRONPCTSAT 9 (L) 05/10/2015 0808    Vitals:   03/29/17 1500 03/29/17 1541 03/29/17 1600 03/29/17 1700  BP: 93/71  (!) 69/52 (!) 75/55  Pulse: (!) 101 (!) 101 99 94  Resp: (!) 22 20 (!) 24   Temp:   (!) 97.5 F (36.4 C)   TempSrc:   Oral   SpO2: 100% 100% 100% 99%  Weight:      Height:       Exam Gen on vent, sedated, cachectic No rash, cyanosis or gangrene Sclera anicteric, throat w ETT  No jvd or bruits Chest clear ant and lat, no wheezing RRR no MRG Abd soft ntnd no mass or ascites +bs GU deferred MS no joint effusions or deformity Ext no LE edema / no wounds or ulcers Neuro is sedated on the vent  CXR - no active disease, +COPD UA 2/17 > clear , 1.005, 0-5 wbc/ rbc, neg protein  Baseline creat = 0.4- 0.6 over last few years 1 episode of AKI 1.60 > 0.60 during Nov 2018 hosp stay 2nd episode AKI in March 2017 w/ creat 1.20 > 0.50.      Impression: 1. Acute renal failure - UA neg, need renal US, will order.  Creat improving, suspect vol depletion and /or ATN.  Hx AKI in 2017 and again in 2018.  No nsaid's or ACEi/ ARB.  Is on PPI at home.  Recommend supportive care, low K diet, cont IVF's.   2. COPD exacerbation - w/ acute / chron resp failure; on the vent 3. Volume - still looks vol low 4. Hyperkalemia - change tube feeds to Nepro, kayexalate down the NG tube, cont IVF's.  5. Hypotension - dc scheduled beta blocker, prn hydralazine as needed only for now.     Plan - as above  Vinson Moselle  MD [employer] pager [phone]   03/29/2017, 5:35 PM

## 2017-03-29 NOTE — Procedures (Signed)
Intubation Procedure Note Jennifer Hurley [id number] 21-Feb-1955  Procedure: Intubation Indications: Respiratory insufficiency  Procedure Details Consent: Risks of procedure as well as the alternatives and risks of each were explained to the (patient/caregiver).  Consent for procedure obtained.   Time Out: Verified patient identification, verified procedure, site/side was marked, verified correct patient position, special equipment/implants available, medications/allergies/relevent history reviewed, required imaging and test results available.  Performed  MAC and 3 x1 attempt.  Dried oral secretions, glue from dentures present.  Thick white secretions in back of throat.  50 mcg fentanyl IV, 6m versed IV and 10 mg etomidate IV given for procedure.  Pt pre-medicated with bicarbonate and IVF's during procedure.    Evaluation Hemodynamic Status: stable; O2 sats: stable throughout Patient's Current Condition: stable Complications: No apparent complications Patient did tolerate procedure well. Chest X-ray ordered to verify placement.  CXR: tube position acceptable.   BNoe Gens NP-C [facility] Pgr: [phone] or if no answer [phone]/18/2019, 11:43 AM

## 2017-03-29 NOTE — Progress Notes (Signed)
Initial Nutrition Assessment  DOCUMENTATION CODES:   Underweight, Severe malnutrition in context of chronic illness  INTERVENTION:   Vital AF 1.2 @ 40 ml/hr  Provides: 1152 kcals, 72 grams protein, 777 ml free water. This meets 100% of calories and 106% of protein needs.   Monitor magnesium, potassium, and phosphorus daily for at least 3 days, MD to replete as needed, as pt is at risk for refeeding syndrome given poor intake.  NUTRITION DIAGNOSIS:   Severe Malnutrition related to chronic illness(COPD) as evidenced by moderate fat depletion, severe fat depletion, severe muscle depletion.  GOAL:   Patient will meet greater than or equal to 90% of their needs  MONITOR:   Labs, Weight trends, Diet advancement, Vent status, TF tolerance, I & O's  REASON FOR ASSESSMENT:   Consult Enteral/tube feeding initiation and management  ASSESSMENT:   Pt with PMH significant for COPD CHF, adrenal insufficiency, and polysubstance abuse. Presents this admission with complaints of throat pain, poor intake, and weight loss. Admitted for COPD exacerbation, influenza A, and acute encephalopathy.    2/18- pt intubated, OGT in place  RD consulted for tube feeding management. No family at bedside to provide nutrition history. MD notes pt's mother died recently and she has been feeling depressed, possibly contributing to poor PO intake.  Pt noted to have oral candidiasis causing painful swallowing. Records indicate pt weighed 84 lb 01/01/17 and 76 lb this admission. Pt has history of CHF, unsure how much is actual dry wt loss.   Patient is currently intubated on ventilator support MV: 6.3 L/min Temp (24hrs), Avg:97.4 F (36.3 C), Min:96.9 F (36.1 C), Max:98 F (36.7 C) BP: 78/53 MAP 57 Propofol: None  Nutrition-Focused physical exam completed.   Medications reviewed and include: solumedrol, NS @ 75 ml/hr Labs reviewed: K 6.1 (H) BUN 68 (H) Creatinine 3.39 (H)   NUTRITION - FOCUSED PHYSICAL  EXAM:    Most Recent Value  Orbital Region  Moderate depletion  Upper Arm Region  Severe depletion  Thoracic and Lumbar Region  Unable to assess  Buccal Region  Severe depletion  Temple Region  Moderate depletion  Clavicle Bone Region  Severe depletion  Clavicle and Acromion Bone Region  Severe depletion  Scapular Bone Region  Unable to assess  Dorsal Hand  Moderate depletion  Patellar Region  Severe depletion  Anterior Thigh Region  Severe depletion  Posterior Calf Region  Severe depletion  Edema (RD Assessment)  Unable to assess     Diet Order:  Diet NPO time specified  EDUCATION NEEDS:   Not appropriate for education at this time  Skin:  Skin Assessment: Reviewed RN Assessment  Last BM:  03/28/17  Height:   Ht Readings from Last 1 Encounters:  03/29/17 5' (1.524 m)    Weight:   Wt Readings from Last 1 Encounters:  03/28/17 76 lb 8 oz (34.7 kg)    Ideal Body Weight:  45.5 kg  BMI:  Body mass index is 14.94 kg/m.  Estimated Nutritional Needs:   Kcal:  1000-1200 kcal/day  Protein:  54-68 g/day  Fluid:  >1 L/day    Vanessa Kick RD, LDN Clinical Nutrition Pager # [phone]

## 2017-03-30 ENCOUNTER — Inpatient Hospital Stay (HOSPITAL_COMMUNITY): Payer: Medicare Other

## 2017-03-30 DIAGNOSIS — J441 Chronic obstructive pulmonary disease with (acute) exacerbation: Secondary | ICD-10-CM

## 2017-03-30 DIAGNOSIS — J101 Influenza due to other identified influenza virus with other respiratory manifestations: Secondary | ICD-10-CM

## 2017-03-30 DIAGNOSIS — E271 Primary adrenocortical insufficiency: Secondary | ICD-10-CM

## 2017-03-30 LAB — BASIC METABOLIC PANEL
Anion gap: 15 (ref 5–15)
Anion gap: 14 (ref 5–15)
BUN: 54 mg/dL — AB (ref 6–20)
BUN: 57 mg/dL — ABNORMAL HIGH (ref 6–20)
CALCIUM: 7.7 mg/dL — AB (ref 8.9–10.3)
CHLORIDE: 120 mmol/L — AB (ref 101–111)
CHLORIDE: 121 mmol/L — AB (ref 101–111)
CO2: 18 mmol/L — AB (ref 22–32)
CO2: 19 mmol/L — AB (ref 22–32)
CREATININE: 2.19 mg/dL — AB (ref 0.44–1.00)
Calcium: 7.9 mg/dL — ABNORMAL LOW (ref 8.9–10.3)
Creatinine, Ser: 2.28 mg/dL — ABNORMAL HIGH (ref 0.44–1.00)
GFR calc Af Amer: 27 mL/min — ABNORMAL LOW (ref >60)
GFR calc Af Amer: 26 mL/min — ABNORMAL LOW (ref >60)
GFR calc non Af Amer: 23 mL/min — ABNORMAL LOW (ref >60)
GFR calc non Af Amer: 22 mL/min — ABNORMAL LOW (ref >60)
GLUCOSE: 259 mg/dL — AB (ref 65–99)
Glucose, Bld: 228 mg/dL — ABNORMAL HIGH (ref 65–99)
POTASSIUM: 2.7 mmol/L — AB (ref 3.5–5.1)
Potassium: 3.5 mmol/L (ref 3.5–5.1)
SODIUM: 153 mmol/L — AB (ref 135–145)
Sodium: 154 mmol/L — ABNORMAL HIGH (ref 135–145)

## 2017-03-30 LAB — GLUCOSE, CAPILLARY
GLUCOSE-CAPILLARY: 258 mg/dL — AB (ref 65–99)
Glucose-Capillary: 229 mg/dL — ABNORMAL HIGH (ref 65–99)
Glucose-Capillary: 256 mg/dL — ABNORMAL HIGH (ref 65–99)
Glucose-Capillary: 220 mg/dL — ABNORMAL HIGH (ref 65–99)
Glucose-Capillary: 253 mg/dL — ABNORMAL HIGH (ref 65–99)
Glucose-Capillary: 289 mg/dL — ABNORMAL HIGH (ref 65–99)

## 2017-03-30 LAB — CBC
HCT: 34.4 % — ABNORMAL LOW (ref 36.0–46.0)
HEMOGLOBIN: 12 g/dL (ref 12.0–15.0)
MCH: 27.4 pg (ref 26.0–34.0)
MCHC: 34.9 g/dL (ref 30.0–36.0)
MCV: 78.5 fL (ref 78.0–100.0)
Platelets: 131 10*3/uL — ABNORMAL LOW (ref 150–400)
RBC: 4.38 MIL/uL (ref 3.87–5.11)
RDW: 14 % (ref 11.5–15.5)
WBC: 9.3 10*3/uL (ref 4.0–10.5)

## 2017-03-30 LAB — PHOSPHORUS
Phosphorus: 3.3 mg/dL (ref 2.5–4.6)
Phosphorus: 2.9 mg/dL (ref 2.5–4.6)

## 2017-03-30 LAB — MAGNESIUM
MAGNESIUM: 1.9 mg/dL (ref 1.7–2.4)
MAGNESIUM: 1.6 mg/dL — AB (ref 1.7–2.4)

## 2017-03-30 MED ORDER — SODIUM CHLORIDE 0.9 % IV SOLN
0.0000 ug/h | INTRAVENOUS | Status: DC
  Administered 2017-03-30: 50 ug/h via INTRAVENOUS
  Filled 2017-03-30 (×2): qty 50

## 2017-03-30 MED ORDER — POTASSIUM CHLORIDE 20 MEQ/15ML (10%) PO SOLN
40.0000 meq | Freq: Once | ORAL | Status: AC
  Administered 2017-03-30: 40 meq
  Filled 2017-03-30: qty 30

## 2017-03-30 MED ORDER — INSULIN ASPART 100 UNIT/ML ~~LOC~~ SOLN
0.0000 [IU] | SUBCUTANEOUS | Status: DC
  Administered 2017-03-30: 5 [IU] via SUBCUTANEOUS
  Administered 2017-03-30: 3 [IU] via SUBCUTANEOUS
  Administered 2017-03-30: 5 [IU] via SUBCUTANEOUS
  Administered 2017-03-31: 1 [IU] via SUBCUTANEOUS
  Administered 2017-03-31: 2 [IU] via SUBCUTANEOUS
  Administered 2017-03-31: 3 [IU] via SUBCUTANEOUS
  Administered 2017-03-31: 5 [IU] via SUBCUTANEOUS
  Administered 2017-03-31: 3 [IU] via SUBCUTANEOUS
  Administered 2017-03-31: 5 [IU] via SUBCUTANEOUS
  Administered 2017-03-31: 2 [IU] via SUBCUTANEOUS
  Administered 2017-04-01: 5 [IU] via SUBCUTANEOUS
  Administered 2017-04-01: 3 [IU] via SUBCUTANEOUS
  Administered 2017-04-01: 5 [IU] via SUBCUTANEOUS
  Administered 2017-04-02 – 2017-04-03 (×8): 1 [IU] via SUBCUTANEOUS
  Administered 2017-04-03: 2 [IU] via SUBCUTANEOUS
  Administered 2017-04-04: 1 [IU] via SUBCUTANEOUS

## 2017-03-30 MED ORDER — NEPRO/CARBSTEADY PO LIQD
1000.0000 mL | ORAL | Status: DC
  Administered 2017-03-30 – 2017-04-01 (×3): 1000 mL
  Filled 2017-03-30 (×6): qty 1000

## 2017-03-30 MED ORDER — HEPARIN SODIUM (PORCINE) 5000 UNIT/ML IJ SOLN: 5000.0000 [IU] | Freq: Two times a day (BID) | INTRAMUSCULAR | Status: DC

## 2017-03-30 MED ORDER — HEPARIN SODIUM (PORCINE) 5000 UNIT/ML IJ SOLN: 5000.0000 [IU] | Freq: Three times a day (TID) | INTRAMUSCULAR | Status: DC

## 2017-03-30 MED ORDER — POTASSIUM CL IN DEXTROSE 5% 20 MEQ/L IV SOLN
20.0000 meq | INTRAVENOUS | Status: DC
  Administered 2017-03-30 – 2017-03-31 (×4): 20 meq via INTRAVENOUS
  Filled 2017-03-30 (×7): qty 1000

## 2017-03-30 MED ORDER — METHYLPREDNISOLONE SODIUM SUCC 40 MG IJ SOLR
40.0000 mg | Freq: Every day | INTRAMUSCULAR | Status: DC
Start: 2017-03-30 — End: 2017-03-31
  Administered 2017-03-30: 40 mg via INTRAVENOUS
  Filled 2017-03-30: qty 1

## 2017-03-30 MED ORDER — FREE WATER
200.0000 mL | Freq: Three times a day (TID) | Status: DC
  Administered 2017-03-30 – 2017-03-31 (×3): 200 mL

## 2017-03-30 MED ORDER — HYDRALAZINE HCL 20 MG/ML IJ SOLN: 10.0000 mg | INTRAMUSCULAR | Status: DC | PRN

## 2017-03-30 MED ORDER — HYDROCORTISONE NA SUCCINATE PF 100 MG IJ SOLR
50.0000 mg | Freq: Four times a day (QID) | INTRAMUSCULAR | Status: DC
  Administered 2017-03-30 – 2017-04-04 (×21): 50 mg via INTRAVENOUS
  Filled 2017-03-30 (×19): qty 2

## 2017-03-30 NOTE — Progress Notes (Signed)
Nutrition Follow-up  DOCUMENTATION CODES:   Underweight, Severe malnutrition in context of chronic illness  INTERVENTION:   Nepro @ 30 ml/hr 200 ml free water flushes every 8 hours (600 ml) Provides: 1296 kcals (1806 kcal with D5), 58 grams protein, 525 ml free water.  Monitor magnesium, potassium, and phosphorus daily for at least 3 days, MD to replete as needed, as pt is at risk for refeeding syndrome given poor intake.  NUTRITION DIAGNOSIS:   Severe Malnutrition related to chronic illness(COPD) as evidenced by moderate fat depletion, severe fat depletion, severe muscle depletion.  Ongoing  GOAL:   Patient will meet greater than or equal to 90% of their needs  Meeting  MONITOR:   Labs, Weight trends, Diet advancement, Vent status, TF tolerance, I & O's  REASON FOR ASSESSMENT:   Consult Enteral/tube feeding initiation and management  ASSESSMENT:   Pt with PMH significant for COPD CHF, adrenal insufficiency, and polysubstance abuse. Presents this admission with complaints of throat pain, poor intake, and weight loss. Admitted for COPD exacerbation, influenza A, and acute encephalopathy.    Phosphorus and magnesium shown to be elevated yesterday. Pt switched to Nepro formula through the night. Nutrition needs adjusted based off of today's vent setting per ASPEN guidelines. With D5 pt is exceeding calorie needs, will decrease rate of Nepro to lower calories but still meet protein needs. Weight noted to trend up 2 lb since last RD visit 2/18 (76 lb to 78 lb).   Patient is currently intubated on ventilator support MV: 9.3 L/min Temp (24hrs), Avg:97.6 F (36.4 C), Min:94.1 F (34.5 C), Max:101.9 F (38.8 C) BP: 89/61 MAP: 67 Propofol: None  Medications reviewed and include: solucortef, SSI, solumedrol, precedex, D5 with 20 mEq KCl @ 125 ml/hr (510 kcal) Labs reviewed: Na 154 (H) CBG 117-259 (H)   Diet Order:  Diet NPO time specified  EDUCATION NEEDS:   Not  appropriate for education at this time  Skin:  Skin Assessment: Reviewed RN Assessment  Last BM:  03/30/17  Height:   Ht Readings from Last 1 Encounters:  03/29/17 5' (1.524 m)    Weight:   Wt Readings from Last 1 Encounters:  03/30/17 78 lb 4.2 oz (35.5 kg)    Ideal Body Weight:  45.5 kg  BMI:  Body mass index is 15.28 kg/m.  Estimated Nutritional Needs:   Kcal:  7628-3151 kcal/day  Protein:  54-68 g/day  Fluid:  >1 L/day    Vanessa Kick RD, LDN Clinical Nutrition Pager # [phone]

## 2017-03-30 NOTE — Progress Notes (Signed)
PULMONARY / CRITICAL CARE MEDICINE   Name: Jennifer Hurley MRN: [id number] DOB: [birth year]    ADMISSION DATE:  2017-04-13 CONSULTATION DATE:  03/29/17  REFERRING MD:  Dr. David Stall / [facility]   CHIEF COMPLAINT:  Respiratory Distress   HISTORY OF PRESENT ILLNESS:   62 y/o F, SNF resident, smoker who presented to [facility] on 2/16 with reports of shortness of breath.    The patient has a history of chronic respiratory failure with prior tracheostomy, ETOH / cocaine abuse, HTN, CHF (LVEF 55%, PA peak pressure 37 04/2012) and adrenal insufficiency.  On presentation, the patient reported decreased appetite, feeling poorly, non-productive cough and shortness of breath.  She also reported that her mother had recently died and she was depressed.    She was admitted per [facility] for suspected COPD exacerbation.  Further evaluation revealed she was influenza A positive.  She was treated with nebulized bronchodilators, tamiflu, IV steroids and mucinex.  In addition, she was given diflucan for oral candidiasis.  The patient developed progressive hypercarbia requiring bipap.  UDS negative on admit.   PCCM consulted 2/18 for evaluation.    SUBJECTIVE:  RN reports fever & tachycardia overnight.  Was on bair hugger / discontinued.  Decreased platelets.  Briefly required neo for hypotension overnight.   VITAL SIGNS: BP (!) 92/56   Pulse (!) 131   Temp (!) 101.9 F (38.8 C) (Oral) Comment: bair hugger and blankets removed  Resp 16   Ht 5' (1.524 m)   Wt 78 lb 4.2 oz (35.5 kg)   SpO2 98%   BMI 15.28 kg/m   HEMODYNAMICS:    VENTILATOR SETTINGS: Vent Mode: PRVC FiO2 (%):  [30 %-40 %] 30 % Set Rate:  [12 bmp-18 bmp] 16 bmp Vt Set:  [370 mL] 370 mL PEEP:  [5 cmH20] 5 cmH20 Plateau Pressure:  [19 cmH20-28 cmH20] 23 cmH20  INTAKE / OUTPUT: I/O last 3 completed shifts: In: 4643.9 [I.V.:2581.9; Other:500; NG/GT:112; IV Piggyback:1450] Out: 2350 [Urine:2350]  PHYSICAL EXAMINATION: General:  Cachectic female  on vent  HEENT: MM pink/moist, ETT Neuro: sedate on precedex  CV: s1s2 rrr, no m/r/g PULM: even/non-labored, lungs bilaterally with improved air movement, no wheezing  NZ:VJKQ, non-tender, bsx4 active  Extremities: warm/dry, no edema  Skin: no rashes or lesions   LABS:  BMET Recent Labs  Lab 03/29/17 0812 03/29/17 1752 03/30/17 0432  NA 142 149* 153*  K 6.1* 4.3 2.7*  CL 105 114* 120*  CO2 23 20* 18*  BUN 68* 58* 54*  CREATININE 3.39* 2.65* 2.19*  GLUCOSE 103* 117* 228*    Electrolytes Recent Labs  Lab 03/29/17 0812 03/29/17 1752 03/30/17 0432  CALCIUM 8.5* 7.7* 7.9*  MG 2.8* 2.1 1.9  PHOS 9.8* 4.8* 3.3    CBC Recent Labs  Lab 13-Apr-2017 1759 03/28/17 0505 03/30/17 0432  WBC 9.8 9.9 9.3  HGB 15.3* 13.3 12.0  HCT 41.6 37.6 34.4*  PLT 247 176 131*    Coag's No results for input(s): APTT, INR in the last 168 hours.  Sepsis Markers Recent Labs  Lab 2017/04/13 1806 2017/04/13 2203  LATICACIDVEN 2.55* 3.30*    ABG Recent Labs  Lab April 13, 2017 2016 03/28/17 1420 03/29/17 1019  PHART 7.218* 7.224* 7.152*  PCO2ART 52.0* 52.2* 75.3*  PO2ART 201* 104 80.2*    Liver Enzymes Recent Labs  Lab Apr 13, 2017 1759  AST 60*  ALT 25  ALKPHOS 52  BILITOT 0.4  ALBUMIN 4.0    Cardiac Enzymes No results for input(s): TROPONINI,  PROBNP in the last 168 hours.  Glucose Recent Labs  Lab 03/29/17 1743 03/29/17 1908 03/29/17 2306 03/30/17 0309 03/30/17 0740  GLUCAP 98 111* 152* 229* 256*    Imaging Portable Chest Xray  Result Date: 03/30/2017 CLINICAL DATA:  ET tube position EXAM: PORTABLE CHEST 1 VIEW COMPARISON:  03/29/2017 FINDINGS: Endotracheal tube and NG tube are unchanged. Hyperinflation of the lungs compatible with COPD. Heart is normal size. Interstitial prominence again noted in the lungs, stable. Blunting of the costophrenic angles bilaterally may reflect small effusions. Bibasilar densities have increased when compared to more remote studies from  last November could reflect infiltrate/pneumonia. IMPRESSION: COPD. Interstitial prominence likely reflects chronic interstitial lung disease. More confluent opacity in the lung bases has progressed over time and could reflect infiltrate/pneumonia. Small effusions. Electronically Signed   By: Charlett Nose M.D.   On: 03/30/2017 07:30   Portable Chest X-ray  Result Date: 03/29/2017 CLINICAL DATA:  ETT placement EXAM: PORTABLE CHEST 1 VIEW COMPARISON:  04/09/2017 FINDINGS: There is an endotracheal tube with the tip 4 cm above the carina. There is a nasogastric tube coursing below the diaphragm. The lungs are hyperinflated likely secondary to COPD. There is bilateral diffuse interstitial thickening likely chronic. There is no pleural effusion or pneumothorax. The heart and mediastinal contours are unremarkable. The osseous structures are unremarkable. IMPRESSION: Endotracheal tube with the tip 4 cm above the carina. Nasogastric tube coursing below the diaphragm. Electronically Signed   By: Elige Ko   On: 03/29/2017 12:00     STUDIES:    CULTURES: Respiratory Viral Panel 2/16 >> positive for influenza A  BCx2 2/16 >>  Tracheal Aspirate 2/18 >>   ANTIBIOTICS: Rocephin 2/16 >> 2/23  Azithro 2/16 >> 2/23  Tamiflu 2/17 >>  Diflucan 2/17 >> 2/18   SIGNIFICANT EVENTS: 2/16  Admit with resp distress, influenza A   LINES/TUBES: ETT 2/18 >>   DISCUSSION: 62 y/o F with PMH of substance abuse, chronic respiratory failure with prior trach admitted on 2/16 with increased SOB, non-productive cough.  Found to be positive for influenza A.  Progressed to BiPAP needs 2/18.    ASSESSMENT / PLAN:  PULMONARY A: Acute Hypercapnic Respiratory Failure Influenza A Positive  COPD  Pulmonary HTN - by ECHO in 2104  Tobacco / Substance Abuse P:   PRVC 8 cc/kg  Daily SBT / WUA  Intermittent CXR  Continue Brovana + Pulmicort  Solumedrol 60 mg IV Q8, consider reduction to 12 in am  Will hold palliative  input for now  CARDIOVASCULAR A:  Hx Cocaine Abuse - UDS negative on admit, daughter reports her "old friends come check her out of the SNF" Tachycardia - ? Beta from nebs + volume depletion P:  ICU monitoring   RENAL A:   AKI - in setting of poor PO intake / volume depletion, improving.  Baseline 0.4 Hyperkalemia - suspect in setting of AKI / acidemia.  Anticipate will correct with correction of acidosis.   P:   Trend BMP / urinary output Replace electrolytes as indicated Avoid nephrotoxic agents, ensure adequate renal perfusion Change IVF to D5 1/2 NS with 20 mEq KCL @ 125 ml/hr (per Renal) Appreciate nephrology input Add free water 200 ml Q8  GASTROINTESTINAL A:   Severe Protein Calorie Malnutrition  P:   NPO / OGT  TF per Nutrition  Monitor for re-feeding syndrome  Pepcid for SUP   HEMATOLOGIC A:   Thrombocytopenia P:  Trend CBC SCD's Hold heparin 2/19, rescheduled for  2/20 am for BID dosing   INFECTIOUS A:   Influenza A Positive P:   Tamiflu as above  Monitor off abx   ENDOCRINE A:   At Risk Hyper/Hypoglycemia  P:   Add SSI   NEUROLOGIC A:   Acute Metabolic Encephalopathy - in setting of hypercarbia / influenza A  P:   RASS goal: 0 to -1  Precedex gtt  PRN fentanyl for pain  PRN versed for sedation   FAMILY  - Updates: Daughter [name]) updated via phone.  Reviewed concept of intubation, CPR and patients current medical status.  She and her brothers are going to come up later today.  Will continue further goals of care discussion at that time.    - Inter-disciplinary family meet or Palliative Care meeting due by:  ongoing    [name], NP-C [facility] Pgr: [phone] or if no answer [phone] 03/30/2017, 10:20 AM

## 2017-03-30 NOTE — Progress Notes (Signed)
[facility] Progress Note  Subjective: on vent, HR up this am along with high fevers per RN.  K down and creat down, Na up 153  Vitals:   03/30/17 0400 03/30/17 0500 03/30/17 0600 03/30/17 0755  BP: 132/84 108/74 114/76   Pulse: (!) 115 (!) 112 (!) 114 (!) 117  Resp:    (!) 21  Temp:      TempSrc:      SpO2: 96% 98% 99% 98%  Weight:  35.5 kg (78 lb 4.2 oz)    Height:        Inpatient medications: . arformoterol  15 mcg Nebulization BID  . budesonide (PULMICORT) nebulizer solution  0.5 mg Nebulization BID  . chlorhexidine gluconate (MEDLINE KIT)  15 mL Mouth Rinse BID  . feeding supplement (NEPRO CARB STEADY)  1,000 mL Per Tube Q24H  . heparin  5,000 Units Subcutaneous Q8H  . ipratropium-albuterol  3 mL Nebulization Q6H  . mouth rinse  15 mL Mouth Rinse QID  . methylPREDNISolone (SOLU-MEDROL) injection  60 mg Intravenous Q8H  . oseltamivir  30 mg Oral Daily   . dexmedetomidine (PRECEDEX) IV infusion 1 mcg/kg/hr (03/30/17 0829)  . dextrose 5 % with KCl 20 mEq / L 20 mEq (03/30/17 0831)  . famotidine (PEPCID) IV Stopped (03/30/17 0024)  . phenylephrine (NEO-SYNEPHRINE) Adult infusion Stopped (03/30/17 0400)   acetaminophen **OR** acetaminophen, albuterol, docusate, fentaNYL (SUBLIMAZE) injection, guaiFENesin, hydrALAZINE, midazolam, ondansetron **OR** ondansetron (ZOFRAN) IV  Exam: Gen on vent, sedated, cachectic Sclera anicteric, throat w ETT  No jvd or bruits Chest clear ant and lat, no wheezing RRR no MRG Abd soft ntnd no mass or ascites +bs MS no joint effusions or deformity Ext no LE edema Neuro is sedated on the vent  CXR - no active disease, +COPD UA 2/17 > clear , 1.005, 0-5 wbc/ rbc, neg protein  Baseline creat = 0.4- 0.6 over last few years 1st episode AKI 1.60 > 0.60 Nov 2018 hosp stay 2nd episode AKI in March 2017 creat 1.20 > 0.50.      Impression: 1. Acute renal failure - UA neg, creat improving daily, down to 2.1 today. Suspect  vol depletion, possibly some degree of ATN.  Hx of AKI in 2017 and again in 2018.  No nsaid's or ACEi/ ARB here.  Recommend continue supportive care.  2. Hyperkalemia - resolved 3. Hypernatremia - due to getting lots of isotonic saline. Have dc'd NS and started D5 with 20 KCl at 125/hr.  4. COPD exacerbation - w/ acute / chron resp failure; on the vent 5. HTN/ hypotension - labile BP's, dc scheduled beta blocker, prn hydralazine IV   Plan - no new suggestions, will sign off   Kelly Splinter MD [facility] pager [phone]   03/30/2017, 8:33 AM   Recent Labs  Lab 03/29/17 0812 03/29/17 1752 03/30/17 0432  NA 142 149* 153*  K 6.1* 4.3 2.7*  CL 105 114* 120*  CO2 23 20* 18*  GLUCOSE 103* 117* 228*  BUN 68* 58* 54*  CREATININE 3.39* 2.65* 2.19*  CALCIUM 8.5* 7.7* 7.9*  PHOS 9.8* 4.8* 3.3   Recent Labs  Lab 04/03/2017 1759  AST 60*  ALT 25  ALKPHOS 52  BILITOT 0.4  PROT 8.3*  ALBUMIN 4.0   Recent Labs  Lab 03/19/2017 1759 03/28/17 0505 03/30/17 0432  WBC 9.8 9.9 9.3  NEUTROABS 8.2*  --   --   HGB 15.3* 13.3 12.0  HCT 41.6 37.6 34.4*  MCV  77.8* 79.7 78.5  PLT 247 176 131*   Iron/TIBC/Ferritin/ %Sat    Component Value Date/Time   IRON 21 (L) 05/10/2015 0808   TIBC 231 (L) 05/10/2015 0808   IRONPCTSAT 9 (L) 05/10/2015 1470

## 2017-03-30 NOTE — Progress Notes (Signed)
Patient returned to full support due to tachycardia (140's). RT will continue to monitor patient.

## 2017-03-30 NOTE — Progress Notes (Signed)
eLink Physician-Brief Progress Note Patient Name: KENORA SPAYD DOB: [birth year] MRN: [id number]   Date of Service  03/30/2017  HPI/Events of Note  K+ = 2.7 and Creatinine = 2.19 (Improved from 4.39)  eICU Interventions  Will order: 1. Replace K+.  2. Repeat BMP at 12 noon.      Intervention Category Major Interventions: Electrolyte abnormality - evaluation and management  Hughey Rittenberry Eugene 03/30/2017, 5:40 AM

## 2017-03-30 NOTE — Progress Notes (Signed)
CRITICAL VALUE ALERT  Critical Value:  Potassium 2.7  Provider- (E-link) Notified  No orders given at this time

## 2017-03-31 ENCOUNTER — Inpatient Hospital Stay (HOSPITAL_COMMUNITY): Payer: Medicare Other

## 2017-03-31 LAB — GLUCOSE, CAPILLARY
Glucose-Capillary: 145 mg/dL — ABNORMAL HIGH (ref 65–99)
Glucose-Capillary: 171 mg/dL — ABNORMAL HIGH (ref 65–99)
Glucose-Capillary: 182 mg/dL — ABNORMAL HIGH (ref 65–99)
Glucose-Capillary: 206 mg/dL — ABNORMAL HIGH (ref 65–99)
Glucose-Capillary: 220 mg/dL — ABNORMAL HIGH (ref 65–99)
Glucose-Capillary: 253 mg/dL — ABNORMAL HIGH (ref 65–99)

## 2017-03-31 LAB — CBC
HEMATOCRIT: 32.8 % — AB (ref 36.0–46.0)
Hemoglobin: 11.6 g/dL — ABNORMAL LOW (ref 12.0–15.0)
MCH: 28.1 pg (ref 26.0–34.0)
MCHC: 35.4 g/dL (ref 30.0–36.0)
MCV: 79.4 fL (ref 78.0–100.0)
PLATELETS: 116 10*3/uL — AB (ref 150–400)
RBC: 4.13 MIL/uL (ref 3.87–5.11)
RDW: 14.4 % (ref 11.5–15.5)
WBC: 10.8 10*3/uL — ABNORMAL HIGH (ref 4.0–10.5)

## 2017-03-31 LAB — BASIC METABOLIC PANEL
Anion gap: 13 (ref 5–15)
BUN: 45 mg/dL — ABNORMAL HIGH (ref 6–20)
CALCIUM: 8.2 mg/dL — AB (ref 8.9–10.3)
CO2: 23 mmol/L (ref 22–32)
CREATININE: 1.67 mg/dL — AB (ref 0.44–1.00)
Chloride: 113 mmol/L — ABNORMAL HIGH (ref 101–111)
GFR, EST AFRICAN AMERICAN: 37 mL/min — AB (ref >60)
GFR, EST NON AFRICAN AMERICAN: 32 mL/min — AB (ref >60)
Glucose, Bld: 160 mg/dL — ABNORMAL HIGH (ref 65–99)
Potassium: 3 mmol/L — ABNORMAL LOW (ref 3.5–5.1)
SODIUM: 149 mmol/L — AB (ref 135–145)

## 2017-03-31 MED ORDER — MAGNESIUM SULFATE 2 GM/50ML IV SOLN
2.0000 g | Freq: Once | INTRAVENOUS | Status: AC
  Administered 2017-03-31: 2 g via INTRAVENOUS
  Filled 2017-03-31: qty 50

## 2017-03-31 MED ORDER — ACETAMINOPHEN 650 MG RE SUPP: 650.0000 mg | Freq: Four times a day (QID) | RECTAL | Status: DC | PRN

## 2017-03-31 MED ORDER — FREE WATER
200.0000 mL | Freq: Four times a day (QID) | Status: DC
  Administered 2017-03-31 – 2017-04-02 (×8): 200 mL

## 2017-03-31 MED ORDER — METOPROLOL TARTRATE 5 MG/5ML IV SOLN
5.0000 mg | INTRAVENOUS | Status: DC | PRN
  Administered 2017-03-31: 5 mg via INTRAVENOUS
  Filled 2017-03-31 (×2): qty 5

## 2017-03-31 MED ORDER — PREDNISONE 20 MG PO TABS: 40.0000 mg | ORAL_TABLET | Freq: Every day | ORAL | Status: DC

## 2017-03-31 MED ORDER — ACETAMINOPHEN 160 MG/5ML PO SOLN
650.0000 mg | Freq: Four times a day (QID) | ORAL | Status: DC | PRN
  Administered 2017-03-31 – 2017-04-03 (×3): 650 mg via ORAL
  Filled 2017-03-31 (×3): qty 20.3

## 2017-03-31 MED ORDER — POTASSIUM CHLORIDE 20 MEQ/15ML (10%) PO SOLN
40.0000 meq | Freq: Two times a day (BID) | ORAL | Status: AC
  Administered 2017-03-31 (×2): 40 meq
  Filled 2017-03-31 (×2): qty 30

## 2017-03-31 MED ORDER — SODIUM CHLORIDE 0.9 % IV SOLN
4.0000 mg/h | Freq: Once | INTRAVENOUS | Status: DC
  Filled 2017-03-31: qty 10

## 2017-03-31 MED ORDER — MIDAZOLAM HCL 2 MG/2ML IJ SOLN
INTRAMUSCULAR | Status: AC
  Administered 2017-03-31: 4 mg
  Filled 2017-03-31: qty 4

## 2017-03-31 MED ORDER — PREDNISONE 20 MG PO TABS
40.0000 mg | ORAL_TABLET | Freq: Every day | ORAL | Status: DC
  Administered 2017-04-01 – 2017-04-04 (×4): 40 mg
  Filled 2017-03-31 (×4): qty 2

## 2017-03-31 MED ORDER — FENTANYL 2500MCG IN NS 250ML (10MCG/ML) PREMIX INFUSION
0.0000 ug/h | INTRAVENOUS | Status: DC
  Administered 2017-03-31: 25 ug/h via INTRAVENOUS
  Administered 2017-03-31: 50 ug/h via INTRAVENOUS
  Administered 2017-04-01: 100 ug/h via INTRAVENOUS
  Administered 2017-04-04: 25 ug/h via INTRAVENOUS
  Filled 2017-03-31 (×3): qty 250

## 2017-03-31 MED ORDER — MIDAZOLAM HCL 2 MG/2ML IJ SOLN: 4.0000 mg | Freq: Once | INTRAMUSCULAR | Status: AC

## 2017-03-31 NOTE — Progress Notes (Signed)
SBT attempted 3 times. Patient failed due to apnea all 3 times. Patient put back on full support. RT will continue to monitor.

## 2017-03-31 NOTE — Progress Notes (Signed)
PULMONARY / CRITICAL CARE MEDICINE   Name: Jennifer Hurley MRN: [id number] DOB: 21-Apr-1955    ADMISSION DATE:  2017-04-13 CONSULTATION DATE:  03/29/17  REFERRING MD:  Dr. David Stall / [facility]   CHIEF COMPLAINT:  Respiratory Distress   HISTORY OF PRESENT ILLNESS:   62 y/o F, SNF resident, smoker who presented to [facility] on 2/16 with reports of shortness of breath.    The patient has a history of chronic respiratory failure with prior tracheostomy, ETOH / cocaine abuse, HTN, CHF (LVEF 55%, PA peak pressure 37 04/2012) and adrenal insufficiency.  On presentation, the patient reported decreased appetite, feeling poorly, non-productive cough and shortness of breath.  She also reported that her mother had recently died and she was depressed.    She was admitted per [facility] for suspected COPD exacerbation.  Further evaluation revealed she was influenza A positive.  She was treated with nebulized bronchodilators, tamiflu, IV steroids and mucinex.  In addition, she was given diflucan for oral candidiasis.  The patient developed progressive hypercarbia requiring bipap.  UDS negative on admit.   PCCM consulted 2/18 for evaluation.    SUBJECTIVE:   Afebrile.  Pt started on fentanyl gtt overnight.  RT reports apnea with weaning attempts this am.  I/O - 2.5L positive for admit.   VITAL SIGNS: BP 125/71   Pulse 94   Temp (!) 97.5 F (36.4 C) (Oral)   Resp 16   Ht 5' (1.524 m)   Wt 81 lb 12.7 oz (37.1 kg)   SpO2 98%   BMI 15.97 kg/m   HEMODYNAMICS:    VENTILATOR SETTINGS: Vent Mode: PRVC FiO2 (%):  [30 %] 30 % Set Rate:  [16 bmp] 16 bmp Vt Set:  [370 mL] 370 mL PEEP:  [5 cmH20] 5 cmH20 Plateau Pressure:  [20 cmH20-27 cmH20] 20 cmH20  INTAKE / OUTPUT: I/O last 3 completed shifts: In: 5421.5 [I.V.:3902.9; NG/GT:1468.7; IV Piggyback:50] Out: 3026 [Urine:3025; Stool:1]  PHYSICAL EXAMINATION: General: cachectic female in NAD lying in bed on vent  HEENT: MM pink/moist, ETT Neuro: sedate CV: s1s2  rrr, no m/r/g PULM: even/non-labored, lungs bilaterally clear anterior, improved air flow  WU:JWJX, non-tender, bsx4 active  Extremities: warm/dry, no edema  Skin: no rashes or lesions  LABS:  BMET Recent Labs  Lab 03/30/17 0432 03/30/17 1021 03/31/17 0300  NA 153* 154* 149*  K 2.7* 3.5 3.0*  CL 120* 121* 113*  CO2 18* 19* 23  BUN 54* 57* 45*  CREATININE 2.19* 2.28* 1.67*  GLUCOSE 228* 259* 160*    Electrolytes Recent Labs  Lab 03/29/17 1752 03/30/17 0432 03/30/17 1021 03/30/17 1640 03/31/17 0300  CALCIUM 7.7* 7.9* 7.7*  --  8.2*  MG 2.1 1.9  --  1.6*  --   PHOS 4.8* 3.3  --  2.9  --     CBC Recent Labs  Lab 03/28/17 0505 03/30/17 0432 03/31/17 0300  WBC 9.9 9.3 10.8*  HGB 13.3 12.0 11.6*  HCT 37.6 34.4* 32.8*  PLT 176 131* 116*    Coag's No results for input(s): APTT, INR in the last 168 hours.  Sepsis Markers Recent Labs  Lab 2017/04/13 1806 04/13/2017 2203  LATICACIDVEN 2.55* 3.30*    ABG Recent Labs  Lab 2017-04-13 2016 03/28/17 1420 03/29/17 1019  PHART 7.218* 7.224* 7.152*  PCO2ART 52.0* 52.2* 75.3*  PO2ART 201* 104 80.2*    Liver Enzymes Recent Labs  Lab 04/13/2017 1759  AST 60*  ALT 25  ALKPHOS 52  BILITOT 0.4  ALBUMIN 4.0    Cardiac Enzymes No results for input(s): TROPONINI, PROBNP in the last 168 hours.  Glucose Recent Labs  Lab 03/30/17 1112 03/30/17 1552 03/30/17 1923 03/30/17 2307 03/31/17 0318 03/31/17 0752  GLUCAP 220* 253* 289* 258* 145* 182*    Imaging Dg Chest Port 1 View  Result Date: 03/31/2017 CLINICAL DATA:  Respiratory failure with hypoxia. Endotracheal tube position. EXAM: PORTABLE CHEST 1 VIEW COMPARISON:  Radiograph yesterday at 0438 hour FINDINGS: Tip of the endotracheal tube 5.1 cm from the carina. Enteric tube in place with tip and side-port below the diaphragm. Slight worsening bibasilar opacities with small effusions. Chronic hyperinflation. Unchanged heart size and mediastinal contours. Left  apical scarring. IMPRESSION: 1. Worsening bibasilar opacities suspicious for worsening pneumonia or pulmonary edema. Small pleural effusions are similar. 2. Chronic hyperinflation. Electronically Signed   By: Rubye Oaks M.D.   On: 03/31/2017 03:44     STUDIES:    CULTURES: Respiratory Viral Panel 2/16 >> positive for influenza A  BCx2 2/16 >>  Tracheal Aspirate 2/18 >>   ANTIBIOTICS: Rocephin 2/16 >> 2/23  Azithro 2/16 >> 2/23  Tamiflu 2/17 >>  Diflucan 2/17 >> 2/18   SIGNIFICANT EVENTS: 2/16  Admit with resp distress, influenza A   LINES/TUBES: ETT 2/18 >>   DISCUSSION: 62 y/o F with PMH of substance abuse, chronic respiratory failure with prior trach admitted on 2/16 with increased SOB, non-productive cough.  Found to be positive for influenza A.  Progressed to BiPAP needs 2/18.    ASSESSMENT / PLAN:  PULMONARY A: Acute Hypercapnic Respiratory Failure Influenza A Positive  COPD  Pulmonary HTN - by ECHO in 2104  Tobacco / Substance Abuse P:   PRVC 8 cc/kg  Daily SBT / WUA  Follow CXR  Brovana + Pulmicort  Change solumedrol to prednisone 40 mg QD  CARDIOVASCULAR A:  Hx Cocaine Abuse - UDS negative on admit, daughter reports her "old friends come check her out of the SNF" Tachycardia - ? Beta from nebs + volume depletion Hydrocortisone Use P:  ICU monitoring  Continue solu-cortef in place of home oral regimen  RENAL A:   AKI - in setting of poor PO intake / volume depletion, improving.  Baseline 0.4 Hyperkalemia - suspect in setting of AKI / acidemia.  Anticipate will correct with correction of acidosis.   P:   Trend BMP / urinary output Replace electrolytes as indicated, Mg / additional K 2/20 Avoid nephrotoxic agents, ensure adequate renal perfusion D5 1/2 NS with 20 mEq KCL @ 170ml/hr (per Renal) Appreciate Nephrology input  Increase free water to 200 ml Q6  GASTROINTESTINAL A:   Severe Protein Calorie Malnutrition  P:   NPO / OGT  TF per  Nutrition  Monitor for refeeding syndrome  Pepcid for SUP    HEMATOLOGIC A:   Thrombocytopenia P:  Trend CBC's  SCD's for DVT prophylaxis  Hold heparin SQ given platelet decrease > note drop occurred on day 2   INFECTIOUS A:   Influenza A Positive Fever - 2/19, ? Iatrogenic with bair hugger P:   Tamiflu as above  Monitor off abx   ENDOCRINE A:   At Risk Hyper/Hypoglycemia  P:   SSI  NEUROLOGIC A:   Acute Metabolic Encephalopathy - in setting of hypercarbia / influenza A  P:   RASS goal: 0 to -1 Precedex gtt  Low Dose Fentanyl gtt, ceiling reduced to 100 mcg > pt on 12.5 mcg currently  PRN versed for sedation  FAMILY  - Updates: No family at bedside am 2/20.  Son [name]) called for update.  He asked about the "next steps".  We reviewed concept of extubation with plan for success but if she fails what would be her wishes in regards to reintubation.  We also reviewed at his request, the concept of CPR.  He wants to discuss further with is sister.  If possible, he would like to be there when we feel she is close to extubation process.    - Inter-disciplinary family meet or Palliative Care meeting due by:  ongoing   [name], NP-C [facility] Pgr: [phone] or if no answer [phone] 03/31/2017, 10:38 AM

## 2017-03-31 NOTE — Progress Notes (Signed)
eLink Physician-Brief Progress Note Patient Name: Jennifer Hurley DOB: Dec 28, 1955 MRN: [id number]   Date of Service  03/31/2017  HPI/Events of Note  Low potassium  eICU Interventions  replaced     Intervention Category Minor Interventions: Electrolytes abnormality - evaluation and management  Henry Russel, P 03/31/2017, 5:56 AM

## 2017-04-01 LAB — BASIC METABOLIC PANEL
ANION GAP: 10 (ref 5–15)
BUN: 32 mg/dL — ABNORMAL HIGH (ref 6–20)
CO2: 23 mmol/L (ref 22–32)
Calcium: 8.5 mg/dL — ABNORMAL LOW (ref 8.9–10.3)
Chloride: 106 mmol/L (ref 101–111)
Creatinine, Ser: 1.23 mg/dL — ABNORMAL HIGH (ref 0.44–1.00)
GFR calc non Af Amer: 47 mL/min — ABNORMAL LOW (ref >60)
GFR, EST AFRICAN AMERICAN: 54 mL/min — AB (ref >60)
GLUCOSE: 265 mg/dL — AB (ref 65–99)
POTASSIUM: 4.3 mmol/L (ref 3.5–5.1)
Sodium: 139 mmol/L (ref 135–145)

## 2017-04-01 LAB — GLUCOSE, CAPILLARY
GLUCOSE-CAPILLARY: 220 mg/dL — AB (ref 65–99)
GLUCOSE-CAPILLARY: 71 mg/dL (ref 65–99)
GLUCOSE-CAPILLARY: 115 mg/dL — AB (ref 65–99)
Glucose-Capillary: 254 mg/dL — ABNORMAL HIGH (ref 65–99)
Glucose-Capillary: 255 mg/dL — ABNORMAL HIGH (ref 65–99)

## 2017-04-01 LAB — CBC
HEMATOCRIT: 33.2 % — AB (ref 36.0–46.0)
HEMOGLOBIN: 11.2 g/dL — AB (ref 12.0–15.0)
MCH: 27.1 pg (ref 26.0–34.0)
MCHC: 33.7 g/dL (ref 30.0–36.0)
MCV: 80.2 fL (ref 78.0–100.0)
Platelets: 101 10*3/uL — ABNORMAL LOW (ref 150–400)
RBC: 4.14 MIL/uL (ref 3.87–5.11)
RDW: 14.3 % (ref 11.5–15.5)
WBC: 10.5 10*3/uL (ref 4.0–10.5)

## 2017-04-01 MED ORDER — METOPROLOL TARTRATE 5 MG/5ML IV SOLN
5.0000 mg | INTRAVENOUS | Status: DC | PRN
  Administered 2017-04-03 – 2017-04-04 (×7): 5 mg via INTRAVENOUS
  Filled 2017-04-01 (×7): qty 5

## 2017-04-01 MED ORDER — FUROSEMIDE 10 MG/ML IJ SOLN
20.0000 mg | Freq: Once | INTRAMUSCULAR | Status: AC
  Administered 2017-04-01: 20 mg via INTRAVENOUS
  Filled 2017-04-01: qty 2

## 2017-04-01 MED ORDER — PHENYLEPHRINE HCL-NACL 10-0.9 MG/250ML-% IV SOLN
0.0000 ug/min | INTRAVENOUS | Status: DC
  Administered 2017-04-01 (×2): 20 ug/min via INTRAVENOUS
  Administered 2017-04-02: 10 ug/min via INTRAVENOUS
  Administered 2017-04-02: 16 ug/min via INTRAVENOUS
  Administered 2017-04-03: 10 ug/min via INTRAVENOUS
  Administered 2017-04-03: 20 ug/min via INTRAVENOUS
  Administered 2017-04-04: 10 ug/min via INTRAVENOUS
  Administered 2017-04-04 (×2): 55 ug/min via INTRAVENOUS
  Filled 2017-04-01 (×8): qty 250

## 2017-04-01 MED ORDER — DEXMEDETOMIDINE HCL IN NACL 200 MCG/50ML IV SOLN
0.0000 ug/kg/h | INTRAVENOUS | Status: DC
  Administered 2017-04-01: 1.1 ug/kg/h via INTRAVENOUS
  Administered 2017-04-01: 1.2 ug/kg/h via INTRAVENOUS
  Administered 2017-04-02 (×3): 1 ug/kg/h via INTRAVENOUS
  Administered 2017-04-02: 1.2 ug/kg/h via INTRAVENOUS
  Administered 2017-04-03 (×2): 1.7 ug/kg/h via INTRAVENOUS
  Administered 2017-04-03: 1.5 ug/kg/h via INTRAVENOUS
  Administered 2017-04-03: 1.7 ug/kg/h via INTRAVENOUS
  Administered 2017-04-03: 1.6 ug/kg/h via INTRAVENOUS
  Administered 2017-04-03: 1.7 ug/kg/h via INTRAVENOUS
  Filled 2017-04-01 (×13): qty 50

## 2017-04-01 MED ORDER — CLONAZEPAM 0.5 MG PO TABS: 0.2500 mg | ORAL_TABLET | Freq: Two times a day (BID) | ORAL | Status: DC

## 2017-04-01 MED ORDER — CLONAZEPAM 0.125 MG PO TBDP
0.2500 mg | ORAL_TABLET | Freq: Two times a day (BID) | ORAL | Status: DC
  Administered 2017-04-01 – 2017-04-03 (×5): 0.25 mg via ORAL
  Filled 2017-04-01 (×5): qty 2

## 2017-04-01 MED ORDER — POLYETHYLENE GLYCOL 3350 17 G PO PACK
17.0000 g | PACK | Freq: Every day | ORAL | Status: DC
  Administered 2017-04-01 – 2017-04-03 (×3): 17 g via ORAL
  Filled 2017-04-01 (×3): qty 1

## 2017-04-01 MED ORDER — POTASSIUM CL IN DEXTROSE 5% 20 MEQ/L IV SOLN
20.0000 meq | INTRAVENOUS | Status: DC
  Administered 2017-04-01 – 2017-04-02 (×3): 20 meq via INTRAVENOUS
  Filled 2017-04-01 (×2): qty 1000

## 2017-04-01 NOTE — Progress Notes (Signed)
eLink Physician-Brief Progress Note Patient Name: Jennifer Hurley DOB: [birth year] MRN: [id number]   Date of Service  04/01/2017  HPI/Events of Note  RT called informing tube is now at 22, was at 24cm earlier. CXR showing tube is 5.5 cm from carina  eICU Interventions  Advance ETT by 2 cm.     Intervention Category Intermediate Interventions: Diagnostic test evaluation  Wilber Oliphant 04/01/2017, 7:59 PM

## 2017-04-01 NOTE — Progress Notes (Signed)
Dr. Kendrick Fries asked for the NAVA NG tube for 1224. RT called management and they will oder the NG tube and have it delivered overnight. RT will set the Pt up on 04/02/17

## 2017-04-01 NOTE — Progress Notes (Signed)
Patient's son [name]) called and updated on status.  Reviewed multiple attempts at weaning with no success.  She has had difficulty with ventilator synchrony related to her underlying obstructive lung disease and agitation.  He indicates he does not want his mother to suffer if she can not get back to her prior to admit status.  He states that she was very limited prior to admit in regards to her dyspnea.  The family is going to meet / talk again on Saturday (23rd) and make a decision.  We reviewed a possible family meeting for Monday 2/25.  No specific time set.  Will follow up with family on 2/22.   [name], NP-C [facility] Pgr: [phone] or if no answer [phone] 04/01/2017, 4:05 PM

## 2017-04-01 NOTE — Progress Notes (Signed)
Inpatient Diabetes Program Recommendations  AACE/ADA: New Consensus Statement on Inpatient Glycemic Control (2015)  Target Ranges:  Prepandial:   less than 140 mg/dL      Peak postprandial:   less than 180 mg/dL (1-2 hours)      Critically ill patients:  140 - 180 mg/dL   Lab Results  Component Value Date   GLUCAP 255 (H) 04/01/2017   HGBA1C 5.5 05/28/2012    Review of Glycemic Control  Blood sugars > 180 mg/dL. On Prednisone and Nepro TF  Inpatient Diabetes Program Recommendations:     Increase Novolog to 0-15 units Q4H.  Continue to follow.  Thank you. Ailene Ards, RD, LDN, CDE Inpatient Diabetes Coordinator [phone]

## 2017-04-01 NOTE — Progress Notes (Signed)
PULMONARY / CRITICAL CARE MEDICINE   Name: JUNETTA HEARN MRN: [id number] DOB: Nov 03, 1955    ADMISSION DATE:  18-Apr-2017 CONSULTATION DATE:  03/29/17  REFERRING MD:  Dr. David Stall / [facility]   CHIEF COMPLAINT:  Respiratory Distress   HISTORY OF PRESENT ILLNESS:   62 y/o F, SNF resident, smoker who presented to [facility] on 2/16 with reports of shortness of breath.    The patient has a history of chronic respiratory failure with prior tracheostomy, ETOH / cocaine abuse, HTN, CHF (LVEF 55%, PA peak pressure 37 04/2012) and adrenal insufficiency.  On presentation, the patient reported decreased appetite, feeling poorly, non-productive cough and shortness of breath.  She also reported that her mother had recently died and she was depressed.    She was admitted per [facility] for suspected COPD exacerbation.  Further evaluation revealed she was influenza A positive.  She was treated with nebulized bronchodilators, tamiflu, IV steroids and mucinex.  In addition, she was given diflucan for oral candidiasis.  The patient developed progressive hypercarbia requiring bipap.  UDS negative on admit.   PCCM consulted 2/18 for evaluation.    SUBJECTIVE:  RN reports variable temperature readings (note axillary temps).  No weaning on vent.  Sedation increased overnight.   VITAL SIGNS: BP (!) 140/95   Pulse 89   Temp (!) 97.4 F (36.3 C) (Oral)   Resp 20   Ht 5' (1.524 m)   Wt 83 lb 1.8 oz (37.7 kg)   SpO2 98%   BMI 16.23 kg/m   HEMODYNAMICS:    VENTILATOR SETTINGS: Vent Mode: PRVC FiO2 (%):  [30 %] 30 % Set Rate:  [16 bmp] 16 bmp Vt Set:  [370 mL] 370 mL PEEP:  [5 cmH20] 5 cmH20 Plateau Pressure:  [22 cmH20-30 cmH20] 30 cmH20  INTAKE / OUTPUT: I/O last 3 completed shifts: In: 6762.6 [I.V.:5076.1; NG/GT:1686.5] Out: 2775 [Urine:2775]  PHYSICAL EXAMINATION: General: cachectic female in NAD on vent  HEENT: MM pink/moist, ETT Neuro: sedate CV: s1s2 rrr, no m/r/g PULM: even/non-labored, lungs  bilaterally coarse, occasional wheezing, occasional dyssynchrony with vent, no effort on PSV GI: protuberant, mild distention, decreased BS   Extremities: warm/dry, trace generalized edema  Skin: no rashes or lesions   LABS:  BMET Recent Labs  Lab 03/30/17 1021 03/31/17 0300 04/01/17 0256  NA 154* 149* 139  K 3.5 3.0* 4.3  CL 121* 113* 106  CO2 19* 23 23  BUN 57* 45* 32*  CREATININE 2.28* 1.67* 1.23*  GLUCOSE 259* 160* 265*    Electrolytes Recent Labs  Lab 03/29/17 1752 03/30/17 0432 03/30/17 1021 03/30/17 1640 03/31/17 0300 04/01/17 0256  CALCIUM 7.7* 7.9* 7.7*  --  8.2* 8.5*  MG 2.1 1.9  --  1.6*  --   --   PHOS 4.8* 3.3  --  2.9  --   --     CBC Recent Labs  Lab 03/30/17 0432 03/31/17 0300 04/01/17 0256  WBC 9.3 10.8* 10.5  HGB 12.0 11.6* 11.2*  HCT 34.4* 32.8* 33.2*  PLT 131* 116* 101*    Coag's No results for input(s): APTT, INR in the last 168 hours.  Sepsis Markers Recent Labs  Lab Apr 18, 2017 1806 04/18/2017 2203  LATICACIDVEN 2.55* 3.30*    ABG Recent Labs  Lab 2017/04/18 2016 03/28/17 1420 03/29/17 1019  PHART 7.218* 7.224* 7.152*  PCO2ART 52.0* 52.2* 75.3*  PO2ART 201* 104 80.2*    Liver Enzymes Recent Labs  Lab 2017-04-18 1759  AST 60*  ALT 25  ALKPHOS 52  BILITOT 0.4  ALBUMIN 4.0    Cardiac Enzymes No results for input(s): TROPONINI, PROBNP in the last 168 hours.  Glucose Recent Labs  Lab 03/31/17 1307 03/31/17 1700 03/31/17 1941 03/31/17 2338 04/01/17 0321 04/01/17 0751  GLUCAP 253* 206* 220* 171* 254* 255*    Imaging No results found.   STUDIES:    CULTURES: Respiratory Viral Panel 2/16 >> positive for influenza A  BCx2 2/16 >>  Tracheal Aspirate 2/18 >>   ANTIBIOTICS: Rocephin 2/16 >> 2/23  Azithro 2/16 >> 2/23  Tamiflu 2/17 >>  Diflucan 2/17 >> 2/18   SIGNIFICANT EVENTS: 2/16  Admit with resp distress, influenza A   LINES/TUBES: ETT 2/18 >>   DISCUSSION: 62 y/o F with PMH of substance  abuse, chronic respiratory failure with prior trach admitted on 2/16 with increased SOB, non-productive cough.  Found to be positive for influenza A.  Progressed to BiPAP needs 2/18.    ASSESSMENT / PLAN:  PULMONARY A: Acute Hypercapnic Respiratory Failure Influenza A Positive  COPD  Pulmonary HTN - by ECHO in 2104  Tobacco / Substance Abuse P:   PRVC 8 cc/kg  Consider transition to NAVA ventilation > RT to see if we can get NGT / appropriate catheter Daily SBT / WUA Follow CXR intermittently  Brovana + Pulmicort  Prednisone 40 mg QD Lasix 20 mg IV x1   CARDIOVASCULAR A:  Hx Cocaine Abuse - UDS negative on admit, daughter reports her "old friends come check her out of the SNF" Tachycardia - ? Beta from nebs + volume depletion Hydrocortisone Use P:  ICU monitoring  Solu-cortef IV in place of home regimen   RENAL A:   AKI - in setting of poor PO intake / volume depletion, improving.  Baseline 0.4 Hyperkalemia - suspect in setting of AKI / acidemia.  Anticipate will correct with correction of acidosis.   P:   Trend BMP / urinary output Replace electrolytes as indicated Avoid nephrotoxic agents, ensure adequate renal perfusion Reduce D5 1/2 NS with 20 mEq KCL to 75 ml/hr  Appreciate Nephrology input  Free water 200 ml Q6  GASTROINTESTINAL A:   Severe Protein Calorie Malnutrition  P:   NPO / OGT  TF per Nutrition  Pepcid for SUP   Miralax for bowel regimen  HEMATOLOGIC A:   Thrombocytopenia P:  Trend CBC  SCD's for DVT prophylaxis  Hold heparin with platelet decrease, do not suspect HIT as drop occurred on day 2 admit   INFECTIOUS A:   Influenza A Positive Fever - 2/19, ? Iatrogenic with bair hugger vs route of assessment (axillary) P:   Tamiflu as above  Monitor off abx  ENDOCRINE A:   At Risk Hyper/Hypoglycemia  P:   SSI  NEUROLOGIC A:   Acute Metabolic Encephalopathy - in setting of hypercarbia / influenza A  P:   RASS goal: 0 to -1  Precedex  gtt  Fentanyl gtt for pain / sedation  PRN versed   FAMILY  - Updates: No family at bedside am 2/21.    Son [name]) updated via phone 2/20.  He asked about the "next steps".  We reviewed concept of extubation with plan for success but if she fails what would be her wishes in regards to reintubation.  We also reviewed at his request, the concept of CPR.  He wants to discuss further with is sister.  If possible, he would like to be there when we feel she is close to extubation process.    -  Inter-disciplinary family meet or Palliative Care meeting due by:  ongoing   [name], NP-C [facility] Pgr: [phone] or if no answer [phone] 04/01/2017, 8:39 AM

## 2017-04-01 NOTE — Progress Notes (Signed)
RT withdrew EET tube 2 cm from 24@ the lip to 22@ the lip per CCM.

## 2017-04-01 NOTE — Progress Notes (Signed)
CSW aware pt is resident of [facility] SNF- received call from facility to check status. Will follow and assess for needs as case progresses. Pt currently in ICU, significant medical needs (ventilator, feeding tube).  Ilean Skill, MSW, LCSW Clinical Social Work 04/01/2017 [phone]

## 2017-04-02 ENCOUNTER — Inpatient Hospital Stay (HOSPITAL_COMMUNITY): Payer: Medicare Other

## 2017-04-02 LAB — MAGNESIUM
MAGNESIUM: 1.1 mg/dL — AB (ref 1.7–2.4)
Magnesium: 1.8 mg/dL (ref 1.7–2.4)

## 2017-04-02 LAB — CBC
HCT: 36.6 % (ref 36.0–46.0)
Hemoglobin: 12.3 g/dL (ref 12.0–15.0)
MCH: 27.7 pg (ref 26.0–34.0)
MCHC: 33.6 g/dL (ref 30.0–36.0)
MCV: 82.4 fL (ref 78.0–100.0)
PLATELETS: 121 10*3/uL — AB (ref 150–400)
RBC: 4.44 MIL/uL (ref 3.87–5.11)
RDW: 14.5 % (ref 11.5–15.5)
WBC: 18.8 10*3/uL — AB (ref 4.0–10.5)

## 2017-04-02 LAB — GLUCOSE, CAPILLARY
GLUCOSE-CAPILLARY: 142 mg/dL — AB (ref 65–99)
Glucose-Capillary: 107 mg/dL — ABNORMAL HIGH (ref 65–99)
Glucose-Capillary: 129 mg/dL — ABNORMAL HIGH (ref 65–99)
Glucose-Capillary: 131 mg/dL — ABNORMAL HIGH (ref 65–99)
Glucose-Capillary: 133 mg/dL — ABNORMAL HIGH (ref 65–99)
Glucose-Capillary: 144 mg/dL — ABNORMAL HIGH (ref 65–99)
Glucose-Capillary: 145 mg/dL — ABNORMAL HIGH (ref 65–99)
Glucose-Capillary: 150 mg/dL — ABNORMAL HIGH (ref 65–99)

## 2017-04-02 LAB — BASIC METABOLIC PANEL
ANION GAP: 9 (ref 5–15)
BUN: 29 mg/dL — ABNORMAL HIGH (ref 6–20)
CALCIUM: 8.6 mg/dL — AB (ref 8.9–10.3)
CHLORIDE: 99 mmol/L — AB (ref 101–111)
CO2: 23 mmol/L (ref 22–32)
CREATININE: 0.94 mg/dL (ref 0.44–1.00)
GFR calc non Af Amer: >60 mL/min (ref >60)
Glucose, Bld: 176 mg/dL — ABNORMAL HIGH (ref 65–99)
Potassium: 4.7 mmol/L (ref 3.5–5.1)
Sodium: 131 mmol/L — ABNORMAL LOW (ref 135–145)

## 2017-04-02 LAB — CULTURE, BLOOD (ROUTINE X 2)
CULTURE: NO GROWTH
Culture: NO GROWTH
SPECIAL REQUESTS: ADEQUATE
Special Requests: ADEQUATE

## 2017-04-02 MED ORDER — SODIUM CHLORIDE 0.9% FLUSH: 10.0000 mL | INTRAVENOUS | Status: DC | PRN

## 2017-04-02 MED ORDER — GLUCERNA 1.2 CAL PO LIQD
1000.0000 mL | ORAL | Status: DC
  Administered 2017-04-03: 1000 mL
  Filled 2017-04-02 (×4): qty 1000

## 2017-04-02 MED ORDER — CHLORHEXIDINE GLUCONATE CLOTH 2 % EX PADS
6.0000 | MEDICATED_PAD | Freq: Every day | CUTANEOUS | Status: DC
  Administered 2017-04-02 – 2017-04-03 (×2): 6 via TOPICAL

## 2017-04-02 MED ORDER — FREE WATER
100.0000 mL | Freq: Four times a day (QID) | Status: DC
  Administered 2017-04-03 – 2017-04-04 (×5): 100 mL

## 2017-04-02 MED ORDER — SODIUM CHLORIDE 0.9 % IV SOLN
6.0000 g | Freq: Once | INTRAVENOUS | Status: AC
  Administered 2017-04-02: 6 g via INTRAVENOUS
  Filled 2017-04-02: qty 12

## 2017-04-02 MED ORDER — SODIUM CHLORIDE 0.9% FLUSH
10.0000 mL | Freq: Two times a day (BID) | INTRAVENOUS | Status: DC
  Administered 2017-04-02 – 2017-04-03 (×2): 10 mL

## 2017-04-02 MED ORDER — POTASSIUM CL IN DEXTROSE 5% 20 MEQ/L IV SOLN
20.0000 meq | INTRAVENOUS | Status: DC
  Administered 2017-04-02: 20 meq via INTRAVENOUS
  Filled 2017-04-02: qty 1000

## 2017-04-02 MED ORDER — CEFEPIME HCL 1 G IJ SOLR
1.0000 g | INTRAMUSCULAR | Status: DC
  Administered 2017-04-02 – 2017-04-04 (×3): 1 g via INTRAVENOUS
  Filled 2017-04-02 (×3): qty 1

## 2017-04-02 NOTE — Progress Notes (Signed)
Peripherally Inserted Central Catheter/Midline Placement  The IV Nurse has discussed with the patient and/or persons authorized to consent for the patient, the purpose of this procedure and the potential benefits and risks involved with this procedure.  The benefits include less needle sticks, lab draws from the catheter, and the patient may be discharged home with the catheter. Risks include, but not limited to, infection, bleeding, blood clot (thrombus formation), and puncture of an artery; nerve damage and irregular heartbeat and possibility to perform a PICC exchange if needed/ordered by physician.  Alternatives to this procedure were also discussed.  Bard Power PICC patient education guide, fact sheet on infection prevention and patient information card has been provided to patient /or left at bedside.    PICC/Midline Placement Documentation  PICC Triple Lumen 04/02/17 PICC Right Brachial 36 cm 0 cm (Active)  Indication for Insertion or Continuance of Line Poor Vasculature-patient has had multiple peripheral attempts or PIVs lasting less than 24 hours 04/02/2017 12:00 PM  Exposed Catheter (cm) 0 cm 04/02/2017 12:00 PM  Site Assessment Clean;Dry;Intact 04/02/2017 12:00 PM  Lumen #1 Status Flushed;Blood return noted 04/02/2017 12:00 PM  Lumen #2 Status Flushed;Blood return noted 04/02/2017 12:00 PM  Lumen #3 Status Flushed;Blood return noted 04/02/2017 12:00 PM  Dressing Type Transparent 04/02/2017 12:00 PM  Dressing Status Clean;Dry;Intact;Antimicrobial disc in place 04/02/2017 12:00 PM  Dressing Intervention New dressing 04/02/2017 12:00 PM  Dressing Change Due 04/09/17 04/02/2017 12:00 PM   Telephone consent signed by Ronnie Derby Albarece 04/02/2017, 12:40 PM

## 2017-04-02 NOTE — Progress Notes (Signed)
[facility] ADULT ICU REPLACEMENT PROTOCOL FOR AM LAB REPLACEMENT ONLY  The patient does apply for the [facility] Adult ICU Electrolyte Replacment Protocol based on the criteria listed below:   1. Is GFR >/= 40 ml/min? Yes.    Patient's GFR today is >60 2. Is urine output >/= 0.5 ml/kg/hr for the last 6 hours? Yes.   Patient's UOP is 2.7 ml/kg/hr 3. Is BUN < 60 mg/dL? Yes.    Patient's BUN today is 29 4. Abnormal electrolyte(s): Mag-1.1 5. Ordered repletion with: per protocol 6. If a panic level lab has been reported, has the CCM MD in charge been notified? Yes.  .   Physician:  Dr. Ron Parker, [place]os 04/02/2017 5:29 AM

## 2017-04-02 NOTE — Progress Notes (Signed)
[facility] PCCM  Changed to NAVA ventilation mode ventilation due to severe air trapping and poor vent triggering. She is fairly sedate.  NAVA set at 1cmH20/microvolt Back up RR 8 with pressure control Apnea 15 seconds  Goal: remove fentanyl, use precedex and versed  Heber LaMoure, MD [place] [facility] Pager: [phone] Cell: [phone] After 3pm or if no response, call [phone]

## 2017-04-02 NOTE — Progress Notes (Signed)
Upon shift assessment, pt's left forearm IV appeared to have been leaking (arm and pillow were soaked).  RN attempted to flush IV; however, site kept leaking and no blood return was noted; IV was removed.  Pt's left forearm has areas of redness present; forearm and hand are cold to the touch.  Critical Care NP made aware.    Heat applied to extremity, and extremity placed in elevated position.  Will continue to monitor.

## 2017-04-02 NOTE — Progress Notes (Signed)
RT placed a new ET Tube holder.

## 2017-04-02 NOTE — Progress Notes (Addendum)
PULMONARY / CRITICAL CARE MEDICINE   Name: Jennifer Hurley MRN: [id number] DOB: 1955-03-21    ADMISSION DATE:  03/22/2017 CONSULTATION DATE:  03/29/17  REFERRING MD:  Dr. David Stall / [facility]   CHIEF COMPLAINT:  Respiratory Distress   HISTORY OF PRESENT ILLNESS:   62 y/o F, SNF resident, smoker who presented to [facility] on 2/16 with reports of shortness of breath.    The patient has a history of chronic respiratory failure with prior tracheostomy, ETOH / cocaine abuse, HTN, CHF (LVEF 55%, PA peak pressure 37 04/2012) and adrenal insufficiency.  On presentation, the patient reported decreased appetite, feeling poorly, non-productive cough and shortness of breath.  She also reported that her mother had recently died and she was depressed.    She was admitted per [facility] for suspected COPD exacerbation.  Further evaluation revealed she was influenza A positive.  She was treated with nebulized bronchodilators, tamiflu, IV steroids and mucinex.  In addition, she was given diflucan for oral candidiasis.  The patient developed progressive hypercarbia requiring bipap.  UDS negative on admit.   PCCM consulted 2/18 for evaluation.    SUBJECTIVE:  RN reports fentanyl at 100 mcg, precedex at 1.  IV infiltrated this am, redness at left forearm.  1.6L out with 20 lasix 2/21.  Tmax 101.5 in last 24 hours.   VITAL SIGNS: BP 118/77   Pulse 87   Temp 99.1 F (37.3 C)   Resp (!) 21   Ht 5' (1.524 m)   Wt 83 lb 8.9 oz (37.9 kg)   SpO2 100%   BMI 16.32 kg/m   HEMODYNAMICS:    VENTILATOR SETTINGS: Vent Mode: PRVC FiO2 (%):  [30 %-40 %] 40 % Set Rate:  [16 bmp] 16 bmp Vt Set:  [370 mL] 370 mL PEEP:  [8 cmH20] 8 cmH20 Plateau Pressure:  [12 cmH20-28 cmH20] 18 cmH20  INTAKE / OUTPUT: I/O last 3 completed shifts: In: 5120.5 [I.V.:3750.5; NG/GT:1220; IV Piggyback:150] Out: 1325 [Urine:1325]  PHYSICAL EXAMINATION: General: cachectic female lying in bed on vent HEENT: MM pink/dry, ETT, scleral  edema Neuro: sedate, awakens with a startle, briefly makes eye contact CV: s1s2 rrr, no m/r/g PULM: even/non-labored, lungs bilaterally diminished GI: protuberant, bsx4 decreased   Extremities: warm/dry, generalized 1+ edema  Skin: no rashes or lesions  LABS:  BMET Recent Labs  Lab 03/31/17 0300 04/01/17 0256 04/02/17 0246  NA 149* 139 131*  K 3.0* 4.3 4.7  CL 113* 106 99*  CO2 23 23 23   BUN 45* 32* 29*  CREATININE 1.67* 1.23* 0.94  GLUCOSE 160* 265* 176*    Electrolytes Recent Labs  Lab 03/29/17 1752 03/30/17 0432  03/30/17 1640 03/31/17 0300 04/01/17 0256 04/02/17 0246  CALCIUM 7.7* 7.9*   < >  --  8.2* 8.5* 8.6*  MG 2.1 1.9  --  1.6*  --   --  1.1*  PHOS 4.8* 3.3  --  2.9  --   --   --    < > = values in this interval not displayed.    CBC Recent Labs  Lab 03/31/17 0300 04/01/17 0256 04/02/17 0246  WBC 10.8* 10.5 18.8*  HGB 11.6* 11.2* 12.3  HCT 32.8* 33.2* 36.6  PLT 116* 101* 121*    Coag's No results for input(s): APTT, INR in the last 168 hours.  Sepsis Markers Recent Labs  Lab 03/16/2017 1806 03/30/2017 2203  LATICACIDVEN 2.55* 3.30*    ABG Recent Labs  Lab 04/05/2017 2016 03/28/17 1420 03/29/17 1019  PHART 7.218* 7.224* 7.152*  PCO2ART 52.0* 52.2* 75.3*  PO2ART 201* 104 80.2*    Liver Enzymes Recent Labs  Lab 29-Mar-2017 1759  AST 60*  ALT 25  ALKPHOS 52  BILITOT 0.4  ALBUMIN 4.0    Cardiac Enzymes No results for input(s): TROPONINI, PROBNP in the last 168 hours.  Glucose Recent Labs  Lab 04/01/17 1157 04/01/17 1616 04/01/17 1955 04/02/17 0029 04/02/17 0329 04/02/17 0747  GLUCAP 220* 71 115* 150* 142* 131*    Imaging Dg Chest Port 1 View  Result Date: 04/02/2017 CLINICAL DATA:  Respiratory failure EXAM: PORTABLE CHEST 1 VIEW COMPARISON:  03/31/2017 FINDINGS: Support devices are stable. There is hyperinflation of the lungs compatible with COPD. Diffuse airspace disease throughout the right lung. Airspace disease in  the left lower lobe. Findings have worsened since prior study. Suspect small effusions. Heart is normal size. IMPRESSION: Worsening bilateral airspace disease, right greater than left concerning for worsening pneumonia. Underlying COPD. Electronically Signed   By: Charlett Nose M.D.   On: 04/02/2017 07:18     STUDIES:  KUB 2/22 >>   CULTURES: Respiratory Viral Panel 2/16 >> positive for influenza A  BCx2 2/16 >>  Tracheal Aspirate 2/18 >>  BCx2 2/21 >>  Tracheal Aspirate 2/21 >>   ANTIBIOTICS: Rocephin 2/16 >> 2/23  Azithro 2/16 >> 2/23  Tamiflu 2/17 >> 2/21 Diflucan 2/17 >> 2/18   SIGNIFICANT EVENTS: 2/16  Admit with resp distress, influenza A   LINES/TUBES: ETT 2/18 >>   DISCUSSION: 62 y/o F with PMH of substance abuse, chronic respiratory failure with prior trach admitted on 2/16 with increased SOB, non-productive cough.  Found to be positive for influenza A.  Progressed to BiPAP needs 2/18.    ASSESSMENT / PLAN:  PULMONARY A: Acute Hypercapnic Respiratory Failure Influenza A Positive  COPD  Pulmonary HTN - by ECHO in 2104  Tobacco / Substance Abuse P:   PRVC 8 cc/kg for rest mode Trial of NAVA ventilation 2/22 once catheter available  Daily SBT / WUA  Follow intermittent CXR  Brovana + Pulmicort  Prednisone 40 mg QD   CARDIOVASCULAR A:  Hx Cocaine Abuse - UDS negative on admit, daughter reports her "old friends come check her out of the SNF" Tachycardia - ? Beta from nebs + volume depletion Hydrocortisone Use P:  ICU monitoring  Solu-cortef IV in place of home regimen  Insert PICC for IV access   RENAL A:   AKI - in setting of poor PO intake / volume depletion, improving.  Baseline 0.4 Hyperkalemia - resolved Hypomagnesemia  P:   Trend BMP / urinary output Replace electrolytes as indicated, Mg 2/22 Avoid nephrotoxic agents, ensure adequate renal perfusion Reduce D5 with KCL to 50 ml/hr  Reduce free water to 100 ml Q6  GASTROINTESTINAL A:    Severe Protein Calorie Malnutrition  ABD Distention  P:   NPO / OGT  TF per Nutrition  Pepcid for SUP  Miralax for bowel regimen  Assess KUB   HEMATOLOGIC A:   Thrombocytopenia P:  Trend CBC  SCD's for DVT prophylaxis  Hold heparin with platelet decrease, do not suspect HIT as drop occurred on day 2   INFECTIOUS A:   Influenza A Positive Fever - 2/19, ? Iatrogenic with bair hugger vs route of assessment (axillary) P:   Tamiflu completed  Monitor off abx   ENDOCRINE A:   At Risk Hyper/Hypoglycemia  P:   SSI  NEUROLOGIC A:   Acute Metabolic Encephalopathy -  in setting of hypercarbia / influenza A  P:   RASS goal: 0 to -1  Precedex gtt  Fentanyl gtt for pain / sedatio n PRN versed  PT efforts / Passive ROM   FAMILY  - Updates: Son updated via phone 2/22.  Will call for update 2/23.  - Inter-disciplinary family meet or Palliative Care meeting due by:  ongoing   [name], NP-C [place] [facility] Pgr: [phone] or if no answer [phone] 04/02/2017, 7:51 AM

## 2017-04-02 NOTE — Progress Notes (Signed)
Nutrition Follow-up  DOCUMENTATION CODES:   Underweight, Severe malnutrition in context of chronic illness  INTERVENTION:  - Mg and Phos have been trending down since intubation and TF initiation. - Will adjust TF regimen: Glucerna 1.2 @ 45 mL/hr which will provide 1296 kcal, 65 grams of protein, and 869 mL free water. - Free water flush to be per MD/NP.   NUTRITION DIAGNOSIS:   Severe Malnutrition related to chronic illness(COPD) as evidenced by moderate fat depletion, severe fat depletion, severe muscle depletion. -ongoing  GOAL:   Patient will meet greater than or equal to 90% of their needs -met with TF  MONITOR:   Vent status, TF tolerance, Weight trends, Labs  ASSESSMENT:   Pt with PMH significant for COPD CHF, adrenal insufficiency, and polysubstance abuse. Presents this admission with complaints of throat pain, poor intake, and weight loss. Admitted for COPD exacerbation, influenza A, and acute encephalopathy.   Pt remains intubated with OGT in place. Weight +7 lbs/3.2 kg since admission. Used current weight (34.7 kg) in re-estimating needs but will continue to monitor weight trends. She is receiving Nepro @ 30 mL/hr with 30 mL free water every 4 hours. This regimen is providing 1296 kcal (102% re-estimated kcal need), 58 grams of protein, and 703 mL free water.   Per Jennifer Hurley's, PCCM NP, note this AM: plan for daily SBT and WUA, flu positive, volume depletion which is now improving.  Patient is currently intubated on ventilator support MV: 6.2 L/min Temp (24hrs), Avg:99.5 F (37.5 C), Min:93 F (33.9 C), Max:101.5 F (38.6 C) BP: 103/71 and MAP: 79  Medications reviewed; 20 mg IV Pepcid/day, 50 mg Solu-cortef QID, sliding scale Novolog, 6 g IV Mg sulfate x1 run today, 1 packet Miralax per OGT/day, 40 mg Deltasone per OGT/day.  Labs reviewed; CBGs: 150, 142, and 131 mg/dL this AM, Na: 131 mmol/L, Cl: 99 mmol/L, BUN: 29 mg/dL, Ca: 8.6 mg/dL, Mg: 1.1 mg/dL.  IVF: D5-20  mEq KCl @ 50 mL/hr (204 kcal). Drips: Neo @ 18 mcg/min, Fentanyl @ 100 mcg/hr, Precedex @ 1 mcg/kg/hr.     Diet Order:  Diet NPO time specified  EDUCATION NEEDS:   Not appropriate for education at this time  Skin:  Skin Assessment: Reviewed RN Assessment  Last BM:  2/22  Height:   Ht Readings from Last 1 Encounters:  03/29/17 5' (1.524 m)    Weight:   Wt Readings from Last 1 Encounters:  04/02/17 83 lb 8.9 oz (37.9 kg)    Ideal Body Weight:  45.5 kg  BMI:  Body mass index is 16.32 kg/m.  Estimated Nutritional Needs:   Kcal:  1266  Protein:  57-64 grams (1.5-1.7 grams/kg)  Fluid:  >/= 1.3 L/day      Jennifer Matin, MS, RD, LDN, [facility] Inpatient Clinical Dietitian Pager # [phone] After hours/weekend pager # [phone]

## 2017-04-03 ENCOUNTER — Inpatient Hospital Stay (HOSPITAL_COMMUNITY): Payer: Medicare Other

## 2017-04-03 LAB — BASIC METABOLIC PANEL
Anion gap: 10 (ref 5–15)
BUN: 33 mg/dL — ABNORMAL HIGH (ref 6–20)
CALCIUM: 8.5 mg/dL — AB (ref 8.9–10.3)
CHLORIDE: 99 mmol/L — AB (ref 101–111)
CO2: 25 mmol/L (ref 22–32)
CREATININE: 0.99 mg/dL (ref 0.44–1.00)
GFR calc non Af Amer: >60 mL/min (ref >60)
Glucose, Bld: 145 mg/dL — ABNORMAL HIGH (ref 65–99)
Potassium: 5.2 mmol/L — ABNORMAL HIGH (ref 3.5–5.1)
SODIUM: 134 mmol/L — AB (ref 135–145)

## 2017-04-03 LAB — GLUCOSE, CAPILLARY
GLUCOSE-CAPILLARY: 137 mg/dL — AB (ref 65–99)
Glucose-Capillary: 105 mg/dL — ABNORMAL HIGH (ref 65–99)
Glucose-Capillary: 118 mg/dL — ABNORMAL HIGH (ref 65–99)
Glucose-Capillary: 124 mg/dL — ABNORMAL HIGH (ref 65–99)
Glucose-Capillary: 139 mg/dL — ABNORMAL HIGH (ref 65–99)
Glucose-Capillary: 156 mg/dL — ABNORMAL HIGH (ref 65–99)

## 2017-04-03 LAB — CBC
HCT: 33.6 % — ABNORMAL LOW (ref 36.0–46.0)
HEMOGLOBIN: 11.3 g/dL — AB (ref 12.0–15.0)
MCH: 27.4 pg (ref 26.0–34.0)
MCHC: 33.6 g/dL (ref 30.0–36.0)
MCV: 81.6 fL (ref 78.0–100.0)
Platelets: 125 10*3/uL — ABNORMAL LOW (ref 150–400)
RBC: 4.12 MIL/uL (ref 3.87–5.11)
RDW: 14.4 % (ref 11.5–15.5)
WBC: 11.1 10*3/uL — ABNORMAL HIGH (ref 4.0–10.5)

## 2017-04-03 LAB — MAGNESIUM: MAGNESIUM: 1.6 mg/dL — AB (ref 1.7–2.4)

## 2017-04-03 MED ORDER — DEXMEDETOMIDINE HCL IN NACL 200 MCG/50ML IV SOLN
0.0000 ug/kg/h | INTRAVENOUS | Status: DC
  Administered 2017-04-03: 2 ug/kg/h via INTRAVENOUS
  Administered 2017-04-03: 1.8 ug/kg/h via INTRAVENOUS
  Administered 2017-04-04 (×4): 2 ug/kg/h via INTRAVENOUS
  Filled 2017-04-03 (×3): qty 100

## 2017-04-03 MED ORDER — DEXMEDETOMIDINE HCL IN NACL 200 MCG/50ML IV SOLN: 0.0000 ug/kg/h | INTRAVENOUS | Status: DC

## 2017-04-03 MED ORDER — MAGNESIUM OXIDE 400 (241.3 MG) MG PO TABS
200.0000 mg | ORAL_TABLET | Freq: Two times a day (BID) | ORAL | Status: AC
  Administered 2017-04-03 (×2): 200 mg via ORAL
  Filled 2017-04-03 (×2): qty 1

## 2017-04-03 MED ORDER — LACTATED RINGERS IV SOLN
1000.0000 mL | Freq: Once | INTRAVENOUS | Status: AC
  Administered 2017-04-03: 1000 mL via INTRAVENOUS

## 2017-04-03 NOTE — Progress Notes (Signed)
PULMONARY / CRITICAL CARE MEDICINE   Name: Jennifer Hurley MRN: [id number] DOB: 1955/10/28    ADMISSION DATE:  04/01/2017 CONSULTATION DATE:  03/29/17  REFERRING MD:  Dr. David Stall / [facility]   CHIEF COMPLAINT:  Respiratory Distress   HISTORY OF PRESENT ILLNESS:   62 y/o F, SNF resident, smoker who presented to [facility] on 2/16 with reports of shortness of breath.    The patient has a history of chronic respiratory failure with prior tracheostomy, ETOH / cocaine abuse, HTN, CHF (LVEF 55%, PA peak pressure 37 04/2012) and adrenal insufficiency.  On presentation, the patient reported decreased appetite, feeling poorly, non-productive cough and shortness of breath.  She also reported that her mother had recently died and she was depressed.    She was admitted per [facility] for suspected COPD exacerbation.  Further evaluation revealed she was influenza A positive.  She was treated with nebulized bronchodilators, tamiflu, IV steroids and mucinex.  In addition, she was given diflucan for oral candidiasis.  The patient developed progressive hypercarbia requiring bipap.  UDS negative on admit.   PCCM consulted 2/18 for evaluation.    SUBJECTIVE:  RN reports variable temperature readings (note axillary temps).  No weaning on vent.  Sedation increased overnight.   VITAL SIGNS: BP 106/76   Pulse (!) 117   Temp 100 F (37.8 C)   Resp 20   Ht 5' (1.524 m)   Wt 97 lb 10.6 oz (44.3 kg)   SpO2 90%   BMI 19.07 kg/m   HEMODYNAMICS:    VENTILATOR SETTINGS: Vent Mode: NAVA;PRVC FiO2 (%):  [40 %-100 %] 60 % Set Rate:  [16 bmp] 16 bmp Vt Set:  [370 mL] 370 mL PEEP:  [8 cmH20] 8 cmH20 Plateau Pressure:  [17 cmH20-22 cmH20] 21 cmH20  INTAKE / OUTPUT: I/O last 3 completed shifts: In: 3436.8 [I.V.:2866.8; NG/GT:420; IV Piggyback:150] Out: 1525 [Urine:1525]  PHYSICAL EXAMINATION: General: Elderly cachectic female on mechanical ventilator dyspneic HEENT: MM pink/moist, ETT Neuro: sedate CV: s1s2  tachycardic no m/r/g PULM: Fair air entry bilaterally, lungs bilaterally coarse, occasional wheezing, synchronous with vent,  GI: protuberant, mild distention, decreased BS   Extremities: warm/dry, trace generalized edema  Skin: no rashes or lesions very dry skin   LABS:  BMET Recent Labs  Lab 04/01/17 0256 04/02/17 0246 04/03/17 0436  NA 139 131* 134*  K 4.3 4.7 5.2*  CL 106 99* 99*  CO2 23 23 25   BUN 32* 29* 33*  CREATININE 1.23* 0.94 0.99  GLUCOSE 265* 176* 145*    Electrolytes Recent Labs  Lab 03/29/17 1752 03/30/17 0432  03/30/17 1640  04/01/17 0256 04/02/17 0246 04/02/17 2020 04/03/17 0436  CALCIUM 7.7* 7.9*   < >  --    < > 8.5* 8.6*  --  8.5*  MG 2.1 1.9  --  1.6*  --   --  1.1* 1.8 1.6*  PHOS 4.8* 3.3  --  2.9  --   --   --   --   --    < > = values in this interval not displayed.    CBC Recent Labs  Lab 04/01/17 0256 04/02/17 0246 04/03/17 0436  WBC 10.5 18.8* 11.1*  HGB 11.2* 12.3 11.3*  HCT 33.2* 36.6 33.6*  PLT 101* 121* 125*    Coag's No results for input(s): APTT, INR in the last 168 hours.  Sepsis Markers Recent Labs  Lab 03/31/2017 1806 03/16/2017 2203  LATICACIDVEN 2.55* 3.30*    ABG Recent Labs  Lab 03/29/2017 2016 03/28/17 1420 03/29/17 1019  PHART 7.218* 7.224* 7.152*  PCO2ART 52.0* 52.2* 75.3*  PO2ART 201* 104 80.2*    Liver Enzymes Recent Labs  Lab 03/26/2017 1759  AST 60*  ALT 25  ALKPHOS 52  BILITOT 0.4  ALBUMIN 4.0    Cardiac Enzymes No results for input(s): TROPONINI, PROBNP in the last 168 hours.  Glucose Recent Labs  Lab 04/02/17 1627 04/02/17 2016 04/02/17 2322 04/03/17 0323 04/03/17 0738 04/03/17 1127  GLUCAP 107* 145* 129* 137* 139* 105*    Imaging Dg Abd 1 View  Result Date: 04/02/2017 CLINICAL DATA:  Orogastric tube placement. EXAM: ABDOMEN - 1 VIEW COMPARISON:  Prior today FINDINGS: A new orogastric tube is seen with tip overlying the body of the stomach. Diffuse colonic dilatation shows  no significant change, likely due to ileus. Diffuse pulmonary interstitial infiltrates again seen. IMPRESSION: New orogastric tube tip overlies the gastric body. Colonic ileus pattern, without significant change. Electronically Signed   By: Myles Rosenthal M.D.   On: 04/02/2017 19:39   Dg Abd Portable 1v  Result Date: 04/03/2017 CLINICAL DATA:  Evaluate NG tube after repositioning. EXAM: PORTABLE ABDOMEN - 1 VIEW COMPARISON:  None. FINDINGS: Bilateral pulmonary infiltrates are identified but would be better assessed with a chest x-ray. The enteric tube terminates in the gastric body. IMPRESSION: 1. Bilateral pulmonary infiltrates would be better assessed with a chest x-ray. 2. The enteric tube terminates within the body of the stomach. Electronically Signed   By: Gerome Sam III M.D   On: 04/03/2017 08:15     STUDIES:    CULTURES: Respiratory Viral Panel 2/16 >> positive for influenza A  BCx2 2/16 >> negative to date Tracheal Aspirate 2/18 >>   ANTIBIOTICS: Rocephin 2/16 >> 2/23  Azithro 2/16 >> 2/23  Tamiflu 2/17 >>  Diflucan 2/17 >> 2/18   SIGNIFICANT EVENTS: 2/16  Admit with resp distress, influenza A   LINES/TUBES: ETT 2/18 >>   DISCUSSION: 62 y/o F with PMH of substance abuse, chronic respiratory failure with prior trach admitted on 2/16 with increased SOB, non-productive cough.  Found to be positive for influenza A.  currently in ICU intubated ASSESSMENT / PLAN:  PULMONARY A: Ventilatory dependent respiratory failure secondary to acute Hypercapnic and hypoxic respiratory Failure secondary to influenza a pneumonia and acute on chronic COPD exacerbation Influenza A Positive  COPD  Pulmonary HTN - by ECHO in 2104  Tobacco / Substance Abuse P:   Continue current ventilator settings Patient did not have weaning trial today secondary to high FiO2 needs Chest x-ray on 04/03/2017 improved compared to 04/02/2017 significantly decreased atelectasis on the left upper lobes Brovana  + Pulmicort  Prednisone 40 mg QD    CARDIOVASCULAR A:  Hx Cocaine Abuse - UDS negative on admit, daughter reports her "old friends come check her out of the SNF" Tachycardia secondary to beta agonist versus volume depletion Hydrocortisone Use Intermittent hypotension requiring pressors P:  ICU monitoring  Solu-cortef IV in place of home regimen  Follow-up echo Patient intermittently requires Neo-Synephrine.  RENAL A:   AKI - in setting of poor PO intake / volume depletion, improving.  Baseline 0.4 Hyperkalemia - suspect in setting of AKI / acidemia.  Anticipate will correct with correction of acidosis.   P:   Trend BMP / urinary output Replace electrolytes as indicated Avoid nephrotoxic agents, ensure adequate renal perfusion Fluid switched to lactated Ringer's at 75 cc/h  Appreciate Nephrology input  Free water 200 ml Q6  GASTROINTESTINAL A:   Severe Protein Calorie Malnutrition  P:   NPO / OGT  TF per Nutrition  Pepcid for SUP   Miralax for bowel regimen  HEMATOLOGIC A:   Thrombocytopenia P:  Trend CBC  SCD's for DVT prophylaxis  Hold heparin with platelet decrease, do not suspect HIT as drop occurred on day 2 admit.  Platelets stable at this time.  No acute signs of bleeding.   INFECTIOUS A:   Influenza A Positive Fever - 2/19, ? Iatrogenic with bair hugger vs route of assessment (axillary) P:   Tamiflu as above  Continue with cefepime  ENDOCRINE A:   At Risk Hyper/Hypoglycemia  P:   SSI  NEUROLOGIC A:   Acute Metabolic Encephalopathy - in setting of hypercarbia / influenza A  P:   RASS goal: 0 to -1  Precedex gtt  Fentanyl gtt for pain / sedation  PRN versed   FAMILY  - Updates:     Son [name]) updated via phone 2/20.  He asked about the "next steps".  We reviewed concept of extubation with plan for success but if she fails what would be her wishes in regards to reintubation.  We also reviewed at his request, the concept of CPR.  He wants to  discuss further with is sister.  If possible, he would like to be there when we feel she is close to extubation process.    - Inter-disciplinary family meet or Palliative Care meeting due by:  ongoing  [facility] Pulmonary Critical Care Pager: [phone]

## 2017-04-04 ENCOUNTER — Inpatient Hospital Stay (HOSPITAL_COMMUNITY): Payer: Medicare Other

## 2017-04-04 DIAGNOSIS — I340 Nonrheumatic mitral (valve) insufficiency: Secondary | ICD-10-CM

## 2017-04-04 DIAGNOSIS — R609 Edema, unspecified: Secondary | ICD-10-CM

## 2017-04-04 LAB — GLUCOSE, CAPILLARY
Glucose-Capillary: 144 mg/dL — ABNORMAL HIGH (ref 65–99)
Glucose-Capillary: 118 mg/dL — ABNORMAL HIGH (ref 65–99)
Glucose-Capillary: 115 mg/dL — ABNORMAL HIGH (ref 65–99)
Glucose-Capillary: 113 mg/dL — ABNORMAL HIGH (ref 65–99)

## 2017-04-04 LAB — ECHOCARDIOGRAM COMPLETE
Height: 60 in
Weight: 1488.55 oz

## 2017-04-04 LAB — CULTURE, RESPIRATORY

## 2017-04-04 LAB — CBC WITH DIFFERENTIAL/PLATELET
Basophils Absolute: 0 10*3/uL (ref 0.0–0.1)
Basophils Relative: 0 %
Eosinophils Absolute: 0 10*3/uL (ref 0.0–0.7)
Eosinophils Relative: 0 %
HCT: 33.7 % — ABNORMAL LOW (ref 36.0–46.0)
Hemoglobin: 11.3 g/dL — ABNORMAL LOW (ref 12.0–15.0)
Lymphocytes Relative: 3 %
Lymphs Abs: 0.4 10*3/uL — ABNORMAL LOW (ref 0.7–4.0)
MCH: 27.4 pg (ref 26.0–34.0)
MCHC: 33.5 g/dL (ref 30.0–36.0)
MCV: 81.8 fL (ref 78.0–100.0)
Monocytes Absolute: 0 10*3/uL — ABNORMAL LOW (ref 0.1–1.0)
Monocytes Relative: 0 %
Neutro Abs: 13.8 10*3/uL — ABNORMAL HIGH (ref 1.7–7.7)
Neutrophils Relative %: 97 %
Platelets: 140 10*3/uL — ABNORMAL LOW (ref 150–400)
RBC: 4.12 MIL/uL (ref 3.87–5.11)
RDW: 14.7 % (ref 11.5–15.5)
WBC: 14.2 10*3/uL — ABNORMAL HIGH (ref 4.0–10.5)

## 2017-04-04 LAB — CULTURE, RESPIRATORY W GRAM STAIN

## 2017-04-04 LAB — COMPREHENSIVE METABOLIC PANEL
ALT: 22 U/L (ref 14–54)
AST: 35 U/L (ref 15–41)
Albumin: 1.5 g/dL — ABNORMAL LOW (ref 3.5–5.0)
Alkaline Phosphatase: 72 U/L (ref 38–126)
Anion gap: 7 (ref 5–15)
BUN: 30 mg/dL — ABNORMAL HIGH (ref 6–20)
CO2: 25 mmol/L (ref 22–32)
Calcium: 8.2 mg/dL — ABNORMAL LOW (ref 8.9–10.3)
Chloride: 101 mmol/L (ref 101–111)
Creatinine, Ser: 0.8 mg/dL (ref 0.44–1.00)
GFR calc Af Amer: >60 mL/min (ref >60)
GFR calc non Af Amer: >60 mL/min (ref >60)
Glucose, Bld: 164 mg/dL — ABNORMAL HIGH (ref 65–99)
Potassium: 5 mmol/L (ref 3.5–5.1)
Sodium: 133 mmol/L — ABNORMAL LOW (ref 135–145)
Total Bilirubin: 0.3 mg/dL (ref 0.3–1.2)
Total Protein: 5.2 g/dL — ABNORMAL LOW (ref 6.5–8.1)

## 2017-04-04 LAB — BRAIN NATRIURETIC PEPTIDE: B Natriuretic Peptide: 139.9 pg/mL — ABNORMAL HIGH (ref 0.0–100.0)

## 2017-04-04 LAB — TROPONIN I: Troponin I: <0.03 ng/mL (ref <0.03)

## 2017-04-04 LAB — PHOSPHORUS: Phosphorus: 3.2 mg/dL (ref 2.5–4.6)

## 2017-04-04 LAB — LACTIC ACID, PLASMA: Lactic Acid, Venous: 0.8 mmol/L (ref 0.5–1.9)

## 2017-04-04 LAB — PROCALCITONIN: Procalcitonin: 4.91 ng/mL

## 2017-04-04 LAB — MAGNESIUM: Magnesium: 1.1 mg/dL — ABNORMAL LOW (ref 1.7–2.4)

## 2017-04-04 MED ORDER — FUROSEMIDE 10 MG/ML IJ SOLN
40.0000 mg | Freq: Once | INTRAMUSCULAR | Status: AC
Start: 2017-04-04 — End: 2017-04-04
  Administered 2017-04-04: 40 mg via INTRAVENOUS

## 2017-04-04 MED ORDER — CISATRACURIUM BOLUS VIA INFUSION
0.0500 mg/kg | Freq: Once | INTRAVENOUS | Status: AC
  Administered 2017-04-04: 2.1 mg via INTRAVENOUS
  Filled 2017-04-04: qty 3

## 2017-04-04 MED ORDER — GLYCOPYRROLATE 1 MG PO TABS
1.0000 mg | ORAL_TABLET | ORAL | Status: DC | PRN
Start: 2017-04-04 — End: 2017-04-05

## 2017-04-04 MED ORDER — GLYCOPYRROLATE 0.2 MG/ML IJ SOLN: 0.2000 mg | INTRAMUSCULAR | Status: DC | PRN

## 2017-04-04 MED ORDER — FENTANYL CITRATE (PF) 100 MCG/2ML IJ SOLN: 100.0000 ug | Freq: Once | INTRAMUSCULAR | Status: DC

## 2017-04-04 MED ORDER — FENTANYL BOLUS VIA INFUSION
50.0000 ug | INTRAVENOUS | Status: DC | PRN
  Administered 2017-04-04: 50 ug via INTRAVENOUS
  Filled 2017-04-04: qty 50

## 2017-04-04 MED ORDER — FENTANYL 2500MCG IN NS 250ML (10MCG/ML) PREMIX INFUSION
0.0000 ug/h | INTRAVENOUS | Status: DC
  Administered 2017-04-04: 100 ug/h via INTRAVENOUS
  Filled 2017-04-04: qty 250

## 2017-04-04 MED ORDER — LORAZEPAM 2 MG/ML PO CONC: 1.0000 mg | ORAL | Status: DC | PRN

## 2017-04-04 MED ORDER — FENTANYL CITRATE (PF) 100 MCG/2ML IJ SOLN: 100.0000 ug | Freq: Once | INTRAMUSCULAR | Status: DC | PRN

## 2017-04-04 MED ORDER — ARTIFICIAL TEARS OPHTHALMIC OINT
1.0000 "application " | TOPICAL_OINTMENT | Freq: Three times a day (TID) | OPHTHALMIC | Status: DC
  Administered 2017-04-04: 1 via OPHTHALMIC
  Filled 2017-04-04: qty 3.5

## 2017-04-04 MED ORDER — LORAZEPAM 2 MG/ML IJ SOLN: 1.0000 mg | INTRAMUSCULAR | Status: DC | PRN

## 2017-04-04 MED ORDER — SODIUM CHLORIDE 0.9 % IV SOLN
0.0000 ug/h | INTRAVENOUS | Status: DC
  Filled 2017-04-04: qty 50

## 2017-04-04 MED ORDER — LORAZEPAM 1 MG PO TABS: 1.0000 mg | ORAL_TABLET | ORAL | Status: DC | PRN

## 2017-04-04 MED ORDER — PROPOFOL 1000 MG/100ML IV EMUL
25.0000 ug/kg/min | INTRAVENOUS | Status: DC
  Administered 2017-04-04: 25 ug/kg/min via INTRAVENOUS
  Filled 2017-04-04: qty 100

## 2017-04-04 MED ORDER — SENNA 8.6 MG PO TABS: 1.0000 | ORAL_TABLET | Freq: Every evening | ORAL | Status: DC | PRN

## 2017-04-04 MED ORDER — MIDAZOLAM HCL 2 MG/2ML IJ SOLN
INTRAMUSCULAR | Status: AC
  Filled 2017-04-04: qty 2

## 2017-04-04 MED ORDER — FENTANYL 2500MCG IN NS 250ML (10MCG/ML) PREMIX INFUSION
100.0000 ug/h | INTRAVENOUS | Status: DC
  Administered 2017-04-04: 25 ug/h via INTRAVENOUS
  Filled 2017-04-04 (×2): qty 250

## 2017-04-04 MED ORDER — FENTANYL BOLUS VIA INFUSION
30.0000 ug | INTRAVENOUS | Status: DC | PRN
  Administered 2017-04-04: 30 ug via INTRAVENOUS
  Filled 2017-04-04: qty 30

## 2017-04-04 MED ORDER — SODIUM CHLORIDE 0.9 % IV SOLN
3.0000 ug/kg/min | INTRAVENOUS | Status: DC
  Administered 2017-04-04: 3 ug/kg/min via INTRAVENOUS
  Filled 2017-04-04: qty 20

## 2017-04-04 MED ORDER — FENTANYL CITRATE (PF) 100 MCG/2ML IJ SOLN
INTRAMUSCULAR | Status: AC
  Filled 2017-04-04: qty 2

## 2017-04-04 MED ORDER — VECURONIUM BROMIDE 10 MG IV SOLR: 4.0000 mg | Freq: Once | INTRAVENOUS | Status: DC

## 2017-04-04 MED ORDER — FENTANYL CITRATE (PF) 100 MCG/2ML IJ SOLN
100.0000 ug | Freq: Once | INTRAMUSCULAR | Status: AC
  Administered 2017-04-04: 100 ug via INTRAVENOUS

## 2017-04-04 MED ORDER — FUROSEMIDE 10 MG/ML IJ SOLN
INTRAMUSCULAR | Status: AC
  Filled 2017-04-04: qty 4

## 2017-04-04 MED ORDER — MIDAZOLAM HCL 2 MG/2ML IJ SOLN
2.0000 mg | INTRAMUSCULAR | Status: DC | PRN
  Administered 2017-04-04: 2 mg via INTRAVENOUS

## 2017-04-06 ENCOUNTER — Telehealth: Payer: Self-pay

## 2017-04-06 LAB — CULTURE, BLOOD (ROUTINE X 2)
Culture: NO GROWTH
Culture: NO GROWTH
SPECIAL REQUESTS: ADEQUATE
Special Requests: ADEQUATE

## 2017-04-06 NOTE — Telephone Encounter (Signed)
On 04/06/17 I received a d/c from [employer] (original[employer]. The d/c is for cremation. The patient is a patient of Doctor McQuaid. The d/c will be taken to Pulmonary Unit for signature.  On 04/07/17 I received the d/c back from Doctor McQuaid. I got the d/c ready and called the funeral home to let them know the d/c is ready for pickup. I also faxed a copy to the funeral home per the funeral home request.

## 2017-04-09 NOTE — Progress Notes (Signed)
  Echocardiogram 2D Echocardiogram has been performed.  Janalyn Harder 03/29/2017, 8:44 AM

## 2017-04-09 NOTE — Death Summary Note (Signed)
DEATH SUMMARY   Patient Details  Name: Jennifer Hurley MRN: [id number] DOB: [birth year]  Admission/Discharge Information   Admit Date:  04-19-17  Date of Death: Date of Death: 04-27-17  Time of Death: Time of Death: April 27, 1949  Length of Stay: 8  Referring Physician: Patient, No Pcp Per   Reason(s) for Hospitalization  Dyspnea  Diagnoses  Preliminary cause of death:   Influenza A pneumonia Secondary Diagnoses (including complications and co-morbidities):  Principal Problem:   Acute respiratory failure with hypercapnia ([place]) Active Problems:   CANDIDIASIS, MOUTH ([place])   Essential hypertension   AKI (acute kidney injury) ([place])   Adrenal insufficiency (Addison's disease) ([place])   Influenza A   Hyperkalemia   Toxic encephalopathy   High anion gap metabolic acidosis   Brief Hospital Course (including significant findings, care, treatment, and services provided and events leading to death)  Jennifer Hurley is a 62 y.o. year old female who was admitted to our facility with shortness of breath.  She has a past medical history significant for severe COPD with recurrent exacerbations and multiple ICU stays in the past.  She has a history of prior tracheostomy for chronic respiratory failure as well.  She developed influenza and severe respiratory failure requiring mechanical ventilation.  She was treated with Tamiflu steroids and bronchodilators.  However despite this she never made any improvement in her x-ray findings continue to worsen.  Patient's family was updated daily with the status and they were realistic about her poor prognosis because of her multiple prior episodes in the past.  They also noted ongoing drug use.  Oxygenation continue to worsen as did chest x-ray findings and by 2017/04/27 her status was abruptly worsening.  Patient's family met with the care team and asked that we provide comfort measures only.  She died peacefully.    Pertinent Labs and Studies   Significant Diagnostic Studies Dg Chest 1 View  Result Date: 2017-04-27 CLINICAL DATA:  Hypoxemia EXAM: CHEST 1 VIEW COMPARISON:  Chest radiograph from earlier today. FINDINGS: Endotracheal tube tip is 4.3 cm above the carina. Enteric tube enters stomach with the tip not seen on this image. Right subclavian central venous catheter terminates in the lower third of the superior vena cava. Pacer pads overlie the left chest. Stable cardiomediastinal silhouette with normal heart size. No pneumothorax. Small bilateral pleural effusions are stable. Severe patchy consolidation throughout both lungs, unchanged. Emphysema. IMPRESSION: 1. Support structures as detailed. 2. Stable severe patchy consolidation throughout both lungs worrisome for multilobar pneumonia/ARDS. 3. Stable small bilateral pleural effusions. 4. Emphysema. Electronically Signed   By: Ilona Sorrel M.D.   On: 2017-04-27 09:24   Dg Chest 2 View  Result Date: 19-Apr-2017 CLINICAL DATA:  Shortness of breath. EXAM: CHEST  2 VIEW COMPARISON:  December 28, 2016 FINDINGS: The study is limited due to over penetration. Scarring in the apices is stable. No pneumothorax. Hyperinflation of lungs consistent with emphysema/COPD. The cardiomediastinal silhouette is stable. No acute infiltrates. IMPRESSION: COPD/emphysema.  Scarring in the apices.  No acute infiltrates. Electronically Signed   By: Dorise Bullion III M.D   On: 04/19/17 19:19   Dg Abd 1 View  Result Date: 04/02/2017 CLINICAL DATA:  Orogastric tube placement. EXAM: ABDOMEN - 1 VIEW COMPARISON:  Prior today FINDINGS: A new orogastric tube is seen with tip overlying the body of the stomach. Diffuse colonic dilatation shows no significant change, likely due to ileus. Diffuse pulmonary interstitial infiltrates again seen. IMPRESSION: New orogastric tube tip  overlies the gastric body. Colonic ileus pattern, without significant change. Electronically Signed   By: Earle Gell M.D.   On: 04/02/2017  19:39   US Renal  Result Date: 03/30/2017 CLINICAL DATA:  Renal failure EXAM: RENAL / URINARY TRACT ULTRASOUND COMPLETE COMPARISON:  CT 05/10/2015 FINDINGS: Right Kidney: Length: 10.2 cm. Echogenicity within normal limits. No mass or hydronephrosis visualized. Left Kidney: Length: 10.6 cm. Echogenicity within normal limits. No mass or hydronephrosis visualized. Bladder: Collapsed around a Foley catheter. IMPRESSION: No acute process or explanation for renal failure. Electronically Signed   By: Abigail Miyamoto M.D.   On: 03/30/2017 10:54   Dg Chest Port 1 View  Result Date: May 04, 2017 CLINICAL DATA:  Respiratory distress. History of COPD and pneumonia. EXAM: PORTABLE CHEST 1 VIEW COMPARISON:  Chest radiograph April 03, 2017 FINDINGS: Cardiomediastinal silhouette appears normal though, predominately obscured by diffuse interstitial and patchy alveolar airspace opacities. Increased lung volumes. Small pleural effusions. Nodular density RIGHT costophrenic angle. No pneumothorax. RIGHT PICC distal tip projecting in distal superior vena cava. Endotracheal tube tip projects 3 cm above the carina. Feeding tube past proximal stomach, distal tip out of field-of-view. Soft tissue planes and included osseous structures are unchanged. IMPRESSION: Similar interstitial and alveolar airspace opacities most compatible with COPD and superimposed pneumonia. Small pleural effusions. Small nodular density RIGHT lung base, recommend close attention on follow-up imaging. Relatively stable appearance of life-support lines. Electronically Signed   By: Elon Alas M.D.   On: May 04, 2017 05:06   Dg Chest Port 1 View  Result Date: 04/03/2017 CLINICAL DATA:  Shortness breath and fever. EXAM: PORTABLE CHEST 1 VIEW COMPARISON:  One-view chest x-ray 04/02/2017 and 03/31/2017. FINDINGS: Progressive interstitial and airspace disease is present. Disease progression is greatest in the right upper lobe. Bilateral airspace disease is  present. Emphysematous changes are again noted in the left upper lobe. The endotracheal tube is stable. A right-sided PICC line is in place. The tip is at the cavoatrial junction. A feeding tube courses off the inferior border of the film. There is mild distention of bowel loops. IMPRESSION: 1. Progressive bilateral interstitial and airspace disease, greatest in the right upper lobe. Findings compatible with multi lobar pneumonia. 2. New right-sided PICC line terminates at the cavoatrial junction. Electronically Signed   By: San Morelle M.D.   On: 04/03/2017 15:29   Dg Chest Port 1 View  Result Date: 04/02/2017 CLINICAL DATA:  Respiratory failure EXAM: PORTABLE CHEST 1 VIEW COMPARISON:  03/31/2017 FINDINGS: Support devices are stable. There is hyperinflation of the lungs compatible with COPD. Diffuse airspace disease throughout the right lung. Airspace disease in the left lower lobe. Findings have worsened since prior study. Suspect small effusions. Heart is normal size. IMPRESSION: Worsening bilateral airspace disease, right greater than left concerning for worsening pneumonia. Underlying COPD. Electronically Signed   By: Rolm Baptise M.D.   On: 04/02/2017 07:18   Dg Chest Port 1 View  Result Date: 03/31/2017 CLINICAL DATA:  Respiratory failure with hypoxia. Endotracheal tube position. EXAM: PORTABLE CHEST 1 VIEW COMPARISON:  Radiograph yesterday at 0438 hour FINDINGS: Tip of the endotracheal tube 5.1 cm from the carina. Enteric tube in place with tip and side-port below the diaphragm. Slight worsening bibasilar opacities with small effusions. Chronic hyperinflation. Unchanged heart size and mediastinal contours. Left apical scarring. IMPRESSION: 1. Worsening bibasilar opacities suspicious for worsening pneumonia or pulmonary edema. Small pleural effusions are similar. 2. Chronic hyperinflation. Electronically Signed   By: Jeb Levering M.D.   On:  03/31/2017 03:44   Portable Chest  Xray  Result Date: 03/30/2017 CLINICAL DATA:  ET tube position EXAM: PORTABLE CHEST 1 VIEW COMPARISON:  03/29/2017 FINDINGS: Endotracheal tube and NG tube are unchanged. Hyperinflation of the lungs compatible with COPD. Heart is normal size. Interstitial prominence again noted in the lungs, stable. Blunting of the costophrenic angles bilaterally may reflect small effusions. Bibasilar densities have increased when compared to more remote studies from last November could reflect infiltrate/pneumonia. IMPRESSION: COPD. Interstitial prominence likely reflects chronic interstitial lung disease. More confluent opacity in the lung bases has progressed over time and could reflect infiltrate/pneumonia. Small effusions. Electronically Signed   By: Rolm Baptise M.D.   On: 03/30/2017 07:30   Portable Chest X-ray  Result Date: 03/29/2017 CLINICAL DATA:  ETT placement EXAM: PORTABLE CHEST 1 VIEW COMPARISON:  03/15/2017 FINDINGS: There is an endotracheal tube with the tip 4 cm above the carina. There is a nasogastric tube coursing below the diaphragm. The lungs are hyperinflated likely secondary to COPD. There is bilateral diffuse interstitial thickening likely chronic. There is no pleural effusion or pneumothorax. The heart and mediastinal contours are unremarkable. The osseous structures are unremarkable. IMPRESSION: Endotracheal tube with the tip 4 cm above the carina. Nasogastric tube coursing below the diaphragm. Electronically Signed   By: Kathreen Devoid   On: 03/29/2017 12:00   Dg Abd Portable 1v  Result Date: 04/03/2017 CLINICAL DATA:  Evaluate NG tube after repositioning. EXAM: PORTABLE ABDOMEN - 1 VIEW COMPARISON:  None. FINDINGS: Bilateral pulmonary infiltrates are identified but would be better assessed with a chest x-ray. The enteric tube terminates in the gastric body. IMPRESSION: 1. Bilateral pulmonary infiltrates would be better assessed with a chest x-ray. 2. The enteric tube terminates within the body of  the stomach. Electronically Signed   By: Dorise Bullion III M.D   On: 04/03/2017 08:15   Dg Abd Portable 1v  Result Date: 04/02/2017 CLINICAL DATA:  Abdominal distention. EXAM: PORTABLE ABDOMEN - 1 VIEW COMPARISON:  CT abdomen and pelvis 05/10/2015 FINDINGS: There is moderate gaseous distension of the colon, particularly of redundant appearing transverse colon. A large amount of stool is present in the rectum. No dilated small bowel loops are seen. An enteric tube terminates in the central abdomen, likely in the mid to distal gastric body. Bibasilar lung opacities were more fully evaluated on today's earlier chest radiograph. No intraperitoneal free air is identified on this supine study. IMPRESSION: 1. Large amount of rectal stool with moderate gaseous distension of the colon, possibly reflecting ileus. No dilated small bowel loops to strongly suggest obstruction. 2. Enteric tube in the gastric body. Electronically Signed   By: Logan Bores M.D.   On: 04/02/2017 10:19    Microbiology Recent Results (from the past 240 hour(s))  Blood culture (routine x 2)     Status: None   Collection Time: 03/13/2017  9:45 PM  Result Value Ref Range Status   Specimen Description BLOOD RIGHT FOREARM  Final   Special Requests   Final    BOTTLES DRAWN AEROBIC AND ANAEROBIC Blood Culture adequate volume Performed at [facility], [place] [address] [place], [place] [address]    Culture   Final    NO GROWTH 5 DAYS Performed at [facility], [place] [address], [place], [place] [address]    Report Status 04/02/2017 FINAL  Final  Blood culture (routine x 2)     Status: None   Collection Time: 03/29/2017  9:45 PM  Result Value Ref Range Status   Specimen Description   Final    BLOOD RIGHT HAND Performed at [place] [address], [place], [place] [address]    Special Requests   Final    BOTTLES DRAWN AEROBIC AND ANAEROBIC Blood Culture adequate volume Performed at  [facility] [address], [place], [place] [address]    Culture   Final    NO GROWTH 5 DAYS Performed at [facility], [place] [address], [place], [place] [address]    Report Status 04/02/2017 FINAL  Final  Respiratory Panel by PCR     Status: Abnormal   Collection Time: 03/16/2017 10:38 PM  Result Value Ref Range Status   Adenovirus NOT DETECTED NOT DETECTED Final   Coronavirus 229E NOT DETECTED NOT DETECTED Final   Coronavirus HKU1 NOT DETECTED NOT DETECTED Final   Coronavirus NL63 NOT DETECTED NOT DETECTED Final   Coronavirus OC43 NOT DETECTED NOT DETECTED Final   Metapneumovirus NOT DETECTED NOT DETECTED Final   Rhinovirus / Enterovirus NOT DETECTED NOT DETECTED Final   Influenza A H1 2009 DETECTED (A) NOT DETECTED Final   Influenza B NOT DETECTED NOT DETECTED Final   Parainfluenza Virus 1 NOT DETECTED NOT DETECTED Final   Parainfluenza Virus 2 NOT DETECTED NOT DETECTED Final   Parainfluenza Virus 3 NOT DETECTED NOT DETECTED Final   Parainfluenza Virus 4 NOT DETECTED NOT DETECTED Final   Respiratory Syncytial Virus NOT DETECTED NOT DETECTED Final   Bordetella pertussis NOT DETECTED NOT DETECTED Final   Chlamydophila pneumoniae NOT DETECTED NOT DETECTED Final   Mycoplasma pneumoniae NOT DETECTED NOT DETECTED Final    Comment: Performed at [facility], [place] [address] [place], [place] [address]  MRSA PCR Screening     Status: None   Collection Time: 03/28/17 10:10 PM  Result Value Ref Range Status   MRSA by PCR NEGATIVE NEGATIVE Final    Comment:        The GeneXpert MRSA Assay (FDA approved for NASAL specimens only), is one component of a comprehensive MRSA colonization surveillance program. It is not intended to diagnose MRSA infection nor to guide or monitor treatment for MRSA infections. Performed at [facility], [place] [address], [place], [place] [address]   Culture, respiratory (NON-Expectorated)     Status: None    Collection Time: 04/01/17  4:37 PM  Result Value Ref Range Status   Specimen Description   Final    TRACHEAL ASPIRATE Performed at [place] [address], [place], [place] [address]    Special Requests   Final    NONE Performed at [facility], [place] [address], [place], [place] [address]    Gram Stain   Final    ABUNDANT WBC PRESENT, PREDOMINANTLY PMN RARE YEAST Performed at [facility], [place] [address] [place], [place] [address]    Culture   Final    FEW CANDIDA ALBICANS RARE FUNGUS (MOLD) ISOLATED, PROBABLE CONTAMINANT/COLONIZER (SAPROPHYTE). CONTACT MICROBIOLOGY IF FURTHER IDENTIFICATION REQUIRED [phone].    Report Status 04-24-2017 FINAL  Final  Culture, blood (Routine X 2) w Reflex to ID Panel     Status: None (Preliminary result)   Collection Time: 04/01/17  4:45 PM  Result Value Ref Range Status   Specimen Description   Final    BLOOD RIGHT WRIST Performed at [facility] [address], [place], [place] [address]    Special Requests  IN PEDIATRIC BOTTLE Blood Culture adequate volume  Final   Culture   Final    NO GROWTH 3 DAYS Performed at [facility], [place] [address], [place], [place] [address]    Report Status PENDING  Incomplete  Culture, blood (Routine X 2) w Reflex to ID Panel     Status: None (Preliminary result)   Collection Time: 04/01/17  4:45 PM  Result Value Ref Range Status   Specimen Description   Final    BLOOD RIGHT ARM Performed at [facility] [address] [place], [place] [address]    Special Requests IN PEDIATRIC BOTTLE Blood Culture adequate volume  Final   Culture   Final    NO GROWTH 3 DAYS Performed at [facility], [place] [address], [place], [place] [address]    Report Status PENDING  Incomplete    Lab Basic Metabolic Panel: Recent Labs  Lab 03/29/17 1752 03/30/17 0432  03/30/17 1640 03/31/17 0300 04/01/17 0256  04/02/17 0246 04/02/17 2020 04/03/17 0436 2017-04-27 0342  NA 149* 153*   < >  --  149* 139 131*  --  134* 133*  K 4.3 2.7*   < >  --  3.0* 4.3 4.7  --  5.2* 5.0  CL 114* 120*   < >  --  113* 106 99*  --  99* 101  CO2 20* 18*   < >  --  23 23 23   --  25 25  GLUCOSE 117* 228*   < >  --  160* 265* 176*  --  145* 164*  BUN 58* 54*   < >  --  45* 32* 29*  --  33* 30*  CREATININE 2.65* 2.19*   < >  --  1.67* 1.23* 0.94  --  0.99 0.80  CALCIUM 7.7* 7.9*   < >  --  8.2* 8.5* 8.6*  --  8.5* 8.2*  MG 2.1 1.9  --  1.6*  --   --  1.1* 1.8 1.6* 1.1*  PHOS 4.8* 3.3  --  2.9  --   --   --   --   --  3.2   < > = values in this interval not displayed.   Liver Function Tests: Recent Labs  Lab 27-Apr-2017 0342  AST 35  ALT 22  ALKPHOS 72  BILITOT 0.3  PROT 5.2*  ALBUMIN 1.5*   No results for input(s): LIPASE, AMYLASE in the last 168 hours. No results for input(s): AMMONIA in the last 168 hours. CBC: Recent Labs  Lab 03/31/17 0300 04/01/17 0256 04/02/17 0246 04/03/17 0436 04/27/17 0342  WBC 10.8* 10.5 18.8* 11.1* 14.2*  NEUTROABS  --   --   --   --  13.8*  HGB 11.6* 11.2* 12.3 11.3* 11.3*  HCT 32.8* 33.2* 36.6 33.6* 33.7*  MCV 79.4 80.2 82.4 81.6 81.8  PLT 116* 101* 121* 125* 140*   Cardiac Enzymes: Recent Labs  Lab 04-27-17 1320  TROPONINI <0.03   Sepsis Labs: Recent Labs  Lab 04/01/17 0256 04/02/17 0246 04/03/17 0436 27-Apr-2017 0342  PROCALCITON  --   --   --  4.91  WBC 10.5 18.8* 11.1* 14.2*  LATICACIDVEN  --   --   --  0.8      Simonne Maffucci 04/05/2017, 2:43 PM

## 2017-04-09 NOTE — Progress Notes (Signed)
After pt given fentanyl bolus for comfort, withdrawal of life sustaining treatment orders initiated at 1920 with RN at bedside.  Vent settings adjusted down to PS5, peep5, 30% fio2 per order while ensuring pt comfort with additional fentanyl bolus.  Pt was extubated to room air at 1935.  Pt was still spontaneously breathing at time of extubation.  Family present in room.

## 2017-04-09 NOTE — Plan of Care (Addendum)
Family Meeting Note  This afternoon I met again with the patient's son, Jennifer Hurley, and daughter Jennifer Hurley.  They stated that their other brother was unable to participate in these conversations.  We again went over how Jennifer Hurley has not improved despite neuromuscular blockade.  In addition we had considered prone positioning however her vasopressor requirement has increased, a relative contraindication to proning.  [name]'s family stated that they believed that in this situation her wishes would be to remove life support and transition to comfort care measures.    They stated that addition the family members are scheduled to arrive at 12 PM.  I explained to him in that situation we would make her a DO NOT RESUSCITATE now and once the family has arrived and is fully prepared to let us know and we would place the full comfort care orders including palliative extubation.  We will stop nimbex now and will need to assess for reversal prior to terminal extubation. If, however, the family finds waiting until neuromuscular blockade is resolved then we can discuss with them terminal extubation under the understanding that the patient will certainly die almost immediately after being removed from the ventilator.  Per Trough et al. in Pharmacologic Paralysis and Withdrawal of Mechanical Ventilation at the End of Life (Troug. Pharmacologic Paralysis and Withdrawal of Mechanical Ventilation at the End of Life. Wilson 2000; [id number] DOI: 53.6468/EHOZ224825003704888)  "In conclusion, we suggest the following guidelines for the use of neuromuscular blocking agents at the time life-sustaining treatment is withdrawn. These agents should never be introduced when the ventilator is being withdrawn, and as a general rule, in patients who are already receiving neuromuscular blocking agents, neuromuscular function should be restored before the life support is withdrawn. The only exception to this rule should be  when death is expected to be both rapid and certain after the removal of the ventilator and when the burdens to the patient and family of waiting for the neuromuscular blockade to diminish to a reversible level exceed the benefits of allowing better assessment of the patient's comfort and the possibility of interaction with loved ones."  Chaplain's services were offered to the family members however they declined, stating that they have spiritual supports already.  Mali Dontre Laduca, MD [facility] Pulmonology/Critical Care Pager [phone] After hours pager: [phone]

## 2017-04-09 NOTE — Progress Notes (Signed)
Notified MD Kloefkorn in regards to patient requiring more FIO2 and PEEP due to patient desaturating at 86-87% on 80% FIO2 and 10 PEEP. Respiratory Therapist Megan J increased Fio2 to 90% and 12 of PEEP. Patient appears to asynchronous on the vent. Patient is not agitated but appears to be uncomfortable in regards to respiratory status. MD Kloefkorn order [place] to give 100 mcg of fentanyl IV push and 2mg  of versed IV push now and initiate fentanyl gtt. Also, MD Kloefkorn ordered to obtain EKG and ABG. Will call back when EKG is in Epic. Will pass along this to RN. Will continue to monitor and assess.

## 2017-04-09 NOTE — Progress Notes (Signed)
Notified MD Kloefkorn in regards to EKG results and CXR results.

## 2017-04-09 NOTE — Progress Notes (Signed)
Bilateral lower extremity venous duplex completed. Technically difficult. No obvious evidence of DVT, superficial thrombosis, or Baker's cyst. Toma Deiters, RVS 04-05-17 11:16 AM

## 2017-04-09 NOTE — Progress Notes (Signed)
   April 15, 2017 2000  Clinical Encounter Type  Visited With Patient and family together  Visit Type Initial;Death;Critical Care  Referral From Nurse  Consult/Referral To Faith community  Spiritual Encounters  Spiritual Needs Emotional;Grief support   Coverage for Sunday 1:00pm-9:00pm  Name: Jennifer Hurley, Jennifer Hurley  Location: ICU 1224  Ashby Dawes of call: End of life  Chaplain was paged to provide extra support to family while Pt was actively dying.  Family at beside. Chaplain at beside when time of death was called. Chaplain continued to provide support to family after death.  Daughter was encouraged to take prayer blanket which was given to the Pt from spiritual care.

## 2017-04-09 NOTE — Progress Notes (Signed)
Patient O2 sats currently low 80's. Patient recruitment performed and patient manually ventilated with PEEP valve; O2 without Improvement. Patient replaced on vent and PEEP increased to +14 of PEEP. Bedside RN, Consulting civil engineer at bedside during this time. MD arrived shortly after. RT will continue to monitor patient.

## 2017-04-09 NOTE — Progress Notes (Signed)
PULMONARY / CRITICAL CARE MEDICINE   Name: Jennifer Hurley MRN: [id number] DOB: 02/01/1956    ADMISSION DATE:  03/12/2017 CONSULTATION DATE:  03/29/17  REFERRING MD:  Dr. Aileen Fass / [facility]   CHIEF COMPLAINT:  Respiratory Distress   HISTORY OF PRESENT ILLNESS:   62 y/o F, SNF resident, smoker who presented to [facility] on 2/16 with reports of shortness of breath.    The patient has a history of chronic respiratory failure with prior tracheostomy, ETOH / cocaine abuse, HTN, CHF (LVEF 55%, PA peak pressure 37 04/2012) and adrenal insufficiency.  On presentation, the patient reported decreased appetite, feeling poorly, non-productive cough and shortness of breath.  She also reported that her mother had recently died and she was depressed.    She was admitted per [facility] for suspected COPD exacerbation.  Further evaluation revealed she was influenza A positive.  She was treated with nebulized bronchodilators, tamiflu, IV steroids and mucinex.  In addition, she was given diflucan for oral candidiasis.  The patient developed progressive hypercarbia requiring bipap.  UDS negative on admit.   PCCM consulted 2/18 for evaluation.    SUBJECTIVE:  Yesterday evening switched from [facility] to [facility] due to persistent hypoxemia.  This morning she became progressively hypoxemic and less unresponsive.  She was given Lasix 40 mg IV with adequate diuresis, sedation increased to arrest of -4 yet continued to have worsening hypoxemia with SPO2 in the high 80s despite 100% FiO2 and 14 of PEEP.  Chest x-ray showed no significant change in bilateral multifocal infiltrates consistent with ARDS.  VITAL SIGNS: BP (!) 137/99   Pulse (!) 117   Temp (!) 101.5 F (38.6 C)   Resp (!) 24   Ht 5' (1.524 m)   Wt 93 lb 0.6 oz (42.2 kg)   SpO2 (!) 84%   BMI 18.17 kg/m   HEMODYNAMICS:    VENTILATOR SETTINGS: Vent Mode: PRVC FiO2 (%):  [60 %-100 %] 100 % Set Rate:  [16 bmp-24 bmp] 24 bmp Vt Set:  [370 mL-450 mL] 450 mL PEEP:  [8  cmH20-14 cmH20] 14 cmH20 Plateau Pressure:  [22 cmH20-32 cmH20] 31 cmH20  INTAKE / OUTPUT: I/O last 3 completed shifts: In: 3287 [I.V.:1733.7; Other:45; BP/PH:4327.6; IV Piggyback:150] Out: 2150 [Urine:2030; Stool:120]  PHYSICAL EXAMINATION: General: Elderly cachectic female on mechanical ventilator HEENT: MM pink/moist, ETT Neuro: sedated CV: s1s2 tachycardic no m/r/g PULM: Synchronous with the ventilator, clear to auscultation anteriorly, no wheezing or crackles GI: protuberant, mild distention Extremities: warm/dry, trace generalized edema  Skin: no rashes or lesions very dry skin   LABS:  BMET Recent Labs  Lab 04/02/17 0246 04/03/17 0436 19-Apr-2017 0342  NA 131* 134* 133*  K 4.7 5.2* 5.0  CL 99* 99* 101  CO2 23 25 25   BUN 29* 33* 30*  CREATININE 0.94 0.99 0.80  GLUCOSE 176* 145* 164*    Electrolytes Recent Labs  Lab 03/30/17 0432  03/30/17 1640  04/02/17 0246 04/02/17 2020 04/03/17 0436 04-19-17 0342  CALCIUM 7.9*   < >  --    < > 8.6*  --  8.5* 8.2*  MG 1.9  --  1.6*  --  1.1* 1.8 1.6* 1.1*  PHOS 3.3  --  2.9  --   --   --   --  3.2   < > = values in this interval not displayed.    CBC Recent Labs  Lab 04/02/17 0246 04/03/17 0436 19-Apr-2017 0342  WBC 18.8* 11.1* 14.2*  HGB 12.3 11.3*  11.3*  HCT 36.6 33.6* 33.7*  PLT 121* 125* 140*    Coag's No results for input(s): APTT, INR in the last 168 hours.  Sepsis Markers Recent Labs  Lab 2017-05-03 0342  LATICACIDVEN 0.8  PROCALCITON 4.91    ABG Recent Labs  Lab 03/29/17 1019 May 03, 2017 0353 05/03/17 1028  PHART 7.152* 7.202* 7.319*  PCO2ART 75.3* 72.2* 56.8*  PO2ART 80.2* 59.8* 53.7*    Liver Enzymes Recent Labs  Lab 2017-05-03 0342  AST 35  ALT 22  ALKPHOS 72  BILITOT 0.3  ALBUMIN 1.5*    Cardiac Enzymes No results for input(s): TROPONINI, PROBNP in the last 168 hours.  Glucose Recent Labs  Lab 04/03/17 1127 04/03/17 1545 04/03/17 1945 04/03/17 2341 05/03/17 0353  2017/05/03 0818  GLUCAP 105* 156* 124* 118* 144* 118*    Imaging Dg Chest 1 View  Result Date: May 03, 2017 CLINICAL DATA:  Hypoxemia EXAM: CHEST 1 VIEW COMPARISON:  Chest radiograph from earlier today. FINDINGS: Endotracheal tube tip is 4.3 cm above the carina. Enteric tube enters stomach with the tip not seen on this image. Right subclavian central venous catheter terminates in the lower third of the superior vena cava. Pacer pads overlie the left chest. Stable cardiomediastinal silhouette with normal heart size. No pneumothorax. Small bilateral pleural effusions are stable. Severe patchy consolidation throughout both lungs, unchanged. Emphysema. IMPRESSION: 1. Support structures as detailed. 2. Stable severe patchy consolidation throughout both lungs worrisome for multilobar pneumonia/ARDS. 3. Stable small bilateral pleural effusions. 4. Emphysema. Electronically Signed   By: Ilona Sorrel M.D.   On: 2017/05/03 09:24   Dg Chest Port 1 View  Result Date: 03-May-2017 CLINICAL DATA:  Respiratory distress. History of COPD and pneumonia. EXAM: PORTABLE CHEST 1 VIEW COMPARISON:  Chest radiograph April 03, 2017 FINDINGS: Cardiomediastinal silhouette appears normal though, predominately obscured by diffuse interstitial and patchy alveolar airspace opacities. Increased lung volumes. Small pleural effusions. Nodular density RIGHT costophrenic angle. No pneumothorax. RIGHT PICC distal tip projecting in distal superior vena cava. Endotracheal tube tip projects 3 cm above the carina. Feeding tube past proximal stomach, distal tip out of field-of-view. Soft tissue planes and included osseous structures are unchanged. IMPRESSION: Similar interstitial and alveolar airspace opacities most compatible with COPD and superimposed pneumonia. Small pleural effusions. Small nodular density RIGHT lung base, recommend close attention on follow-up imaging. Relatively stable appearance of life-support lines. Electronically Signed    By: Elon Alas M.D.   On: 05/03/2017 05:06   Dg Chest Port 1 View  Result Date: 04/03/2017 CLINICAL DATA:  Shortness breath and fever. EXAM: PORTABLE CHEST 1 VIEW COMPARISON:  One-view chest x-ray 04/02/2017 and 03/31/2017. FINDINGS: Progressive interstitial and airspace disease is present. Disease progression is greatest in the right upper lobe. Bilateral airspace disease is present. Emphysematous changes are again noted in the left upper lobe. The endotracheal tube is stable. A right-sided PICC line is in place. The tip is at the cavoatrial junction. A feeding tube courses off the inferior border of the film. There is mild distention of bowel loops. IMPRESSION: 1. Progressive bilateral interstitial and airspace disease, greatest in the right upper lobe. Findings compatible with multi lobar pneumonia. 2. New right-sided PICC line terminates at the cavoatrial junction. Electronically Signed   By: San Morelle M.D.   On: 04/03/2017 15:29     STUDIES:    CULTURES: Respiratory Viral Panel 2/16 >> positive for influenza A  BCx2 2/16 >> negative to date Tracheal Aspirate 2/18 >>   ANTIBIOTICS: Rocephin 2/16 >>  2/23  Azithro 2/16 >> 2/23  Tamiflu 2/17 >>  Diflucan 2/17 >> 2/18   SIGNIFICANT EVENTS: 2/16  Admit with resp distress, influenza A   LINES/TUBES: ETT 2/18 >>   DISCUSSION: 62 y/o F with PMH of substance abuse, chronic respiratory failure with prior trach admitted on 2/16 with increased SOB, non-productive cough.  Found to be positive for influenza A.  currently in ICU intubated ASSESSMENT / PLAN:  PULMONARY A: Ventilatory dependent respiratory failure secondary to acute Hypercapnic and hypoxic respiratory Failure secondary to influenza a pneumonia and acute on chronic COPD exacerbation Influenza A Positive  COPD  Pulmonary HTN - by ECHO in 2104  Tobacco / Substance Abuse P:   Neuromuscular blockade started on 2/24 for refractory hypoxemia in the setting of  ARDS despite adequate sedation however continues with oxygen saturation in the 80s despite full ventilator support Repeat chest x-ray 2/24 with no significant change, multifocal pulmonary infiltrates consistent with ARDS Attempted diuresis without improvement Brovana + Pulmicort  Prednisone 40 mg QD    CARDIOVASCULAR A:  Hx Cocaine Abuse - UDS negative on admit, daughter reports her "old friends come check her out of the SNF" Tachycardia secondary to beta agonist versus volume depletion Hydrocortisone Use Intermittent hypotension requiring pressors P:  ICU monitoring  Solu-cortef IV in place of home regimen  Follow-up echo Patient intermittently requires Neo-Synephrine.  RENAL A:   AKI - in setting of poor PO intake / volume depletion, improving.  Baseline 0.4 Hyperkalemia - suspect in setting of AKI / acidemia.  Anticipate will correct with correction of acidosis.   P:   Trend BMP / urinary output Replace electrolytes as indicated Avoid nephrotoxic agents, ensure adequate renal perfusion Metabolic acidosis and acute kidney injury improving, will discontinue lactated Ringer's at this time Appreciate Nephrology input  Free water 200 ml Q6  GASTROINTESTINAL A:   Severe Protein Calorie Malnutrition  P:   NPO / OGT  TF per Nutrition  Pepcid for SUP   Miralax for bowel regimen  HEMATOLOGIC A:   Thrombocytopenia P:  Trend CBC  SCD's for DVT prophylaxis  Hold heparin with platelet decrease, do not suspect HIT as drop occurred on day 2 admit.  Platelets stable at this time.  No acute signs of bleeding.   INFECTIOUS A:   Influenza A Positive Fever - 2/19, ? Iatrogenic with bair hugger vs route of assessment (axillary) P:   Tamiflu as above  Continue with cefepime  ENDOCRINE A:   At Risk Hyper/Hypoglycemia  P:   SSI  NEUROLOGIC A:   Acute Metabolic Encephalopathy - in setting of hypercarbia / influenza A  P:   RASS goal: -4 while on neuromuscular  blockade Propofol and fentanyl per neuromuscular blockade protocol  FAMILY  - Updates:     Son Timmothy Sours) updated via phone 2/20.  He asked about the "next steps".  We reviewed concept of extubation with plan for success but if she fails what would be her wishes in regards to reintubation.  We also reviewed at his request, the concept of CPR.  He wants to discuss further with is sister.  If possible, he would like to be there when we feel she is close to extubation process.   -I met with the patient's son and daughter at the bedside today and explained to them the gravity of her worsening hypoxemia despite full support.  They stated that at baseline prior to this illness she developed severe dyspnea with walking even a few steps  from the bed to the restroom.  I explained that it is unlikely that she will survive and recover to any significant quality of life.  They requested time to discuss this amongst themselves about whether the patient would prefer to transition to comfort care at this time  - Inter-disciplinary family meet or Palliative Care meeting due by:  ongoing  Mali Nikiya Starn, MD [facility] Pulmonology/Critical Care Pager [phone] After hours pager: [phone]

## 2017-04-09 DEATH — deceased

## 2017-04-21 LAB — BLOOD GAS, ARTERIAL
ACID-BASE EXCESS: 1.6 mmol/L (ref 0.0–2.0)
Acid-base deficit: 1.9 mmol/L (ref 0.0–2.0)
Acid-base deficit: 2.5 mmol/L — ABNORMAL HIGH (ref 0.0–2.0)
Bicarbonate: 26.9 mmol/L (ref 20.0–28.0)
Bicarbonate: 27.7 mmol/L (ref 20.0–28.0)
Drawn by: 232811
Drawn by: 441261
FIO2: 70
FIO2: 100
LHR: 24 {breaths}/min
MECHVT: 370 mL
O2 SAT: 84.6 %
O2 Saturation: 84.5 %
PATIENT TEMPERATURE: 38.5
PCO2 ART: 56.8 mmHg — AB (ref 32.0–48.0)
PEEP/CPAP: 12 cmH2O
PEEP: 8 cmH2O
PH ART: 7.319 — AB (ref 7.350–7.450)
Patient temperature: 37.7
RATE: 16 resp/min
VT: 450 mL
pCO2 arterial: 72.2 mmHg (ref 32.0–48.0)
pH, Arterial: 7.202 — ABNORMAL LOW (ref 7.350–7.450)
pO2, Arterial: 59.8 mmHg — ABNORMAL LOW (ref 83.0–108.0)
pO2, Arterial: 53.7 mmHg — ABNORMAL LOW (ref 83.0–108.0)

## 2018-01-13 LAB — BLOOD GAS, ARTERIAL
Acid-base deficit: 6.9 mmol/L — ABNORMAL HIGH (ref 0.0–2.0)
BICARBONATE: 20.8 mmol/L (ref 20.0–28.0)
Drawn by: 103701
O2 CONTENT: 3 L/min
O2 SAT: 96.4 %
PCO2 ART: 52.2 mmHg — AB (ref 32.0–48.0)
PO2 ART: 104 mmHg (ref 83.0–108.0)
Patient temperature: 98.6
pH, Arterial: 7.224 — ABNORMAL LOW (ref 7.350–7.450)
